# Patient Record
Sex: Female | Born: 1940 | Race: White | Hispanic: No | Marital: Married | Smoking: Never smoker
Health system: Southern US, Community
[De-identification: ages and names within clinical notes are randomized; demographics above are authoritative.]

## PROBLEM LIST (undated history)

## (undated) DIAGNOSIS — I10 Essential (primary) hypertension: Secondary | ICD-10-CM

## (undated) DIAGNOSIS — F5104 Psychophysiologic insomnia: Secondary | ICD-10-CM

## (undated) DIAGNOSIS — E7800 Pure hypercholesterolemia, unspecified: Secondary | ICD-10-CM

## (undated) DIAGNOSIS — L600 Ingrowing nail: Secondary | ICD-10-CM

## (undated) DIAGNOSIS — M25562 Pain in left knee: Secondary | ICD-10-CM

## (undated) DIAGNOSIS — M62830 Muscle spasm of back: Secondary | ICD-10-CM

## (undated) DIAGNOSIS — E871 Hypo-osmolality and hyponatremia: Secondary | ICD-10-CM

## (undated) DIAGNOSIS — F418 Other specified anxiety disorders: Secondary | ICD-10-CM

## (undated) DIAGNOSIS — E559 Vitamin D deficiency, unspecified: Secondary | ICD-10-CM

## (undated) DIAGNOSIS — M419 Scoliosis, unspecified: Secondary | ICD-10-CM

## (undated) DIAGNOSIS — E785 Hyperlipidemia, unspecified: Secondary | ICD-10-CM

## (undated) DIAGNOSIS — R42 Dizziness and giddiness: Secondary | ICD-10-CM

## (undated) DIAGNOSIS — M545 Low back pain, unspecified: Secondary | ICD-10-CM

## (undated) DIAGNOSIS — K5792 Diverticulitis of intestine, part unspecified, without perforation or abscess without bleeding: Secondary | ICD-10-CM

## (undated) DIAGNOSIS — G8929 Other chronic pain: Secondary | ICD-10-CM

## (undated) DIAGNOSIS — Z860100 Personal history of colon polyps, unspecified: Secondary | ICD-10-CM

## (undated) DIAGNOSIS — K279 Peptic ulcer, site unspecified, unspecified as acute or chronic, without hemorrhage or perforation: Secondary | ICD-10-CM

## (undated) DIAGNOSIS — E079 Disorder of thyroid, unspecified: Secondary | ICD-10-CM

## (undated) DIAGNOSIS — N952 Postmenopausal atrophic vaginitis: Secondary | ICD-10-CM

## (undated) DIAGNOSIS — K219 Gastro-esophageal reflux disease without esophagitis: Secondary | ICD-10-CM

## (undated) DIAGNOSIS — K449 Diaphragmatic hernia without obstruction or gangrene: Secondary | ICD-10-CM

## (undated) DIAGNOSIS — K648 Other hemorrhoids: Secondary | ICD-10-CM

## (undated) DIAGNOSIS — G4733 Obstructive sleep apnea (adult) (pediatric): Secondary | ICD-10-CM

## (undated) DIAGNOSIS — M1990 Unspecified osteoarthritis, unspecified site: Secondary | ICD-10-CM

## (undated) DIAGNOSIS — E041 Nontoxic single thyroid nodule: Secondary | ICD-10-CM

## (undated) HISTORY — DX: Dizziness and giddiness: R42

## (undated) HISTORY — DX: Gastro-esophageal reflux disease without esophagitis: K21.9

## (undated) HISTORY — DX: Personal history of colon polyps, unspecified: Z86.0100

## (undated) HISTORY — DX: Postmenopausal atrophic vaginitis: N95.2

## (undated) HISTORY — DX: Scoliosis, unspecified: M41.9

## (undated) HISTORY — PX: CHOLECYSTECTOMY: SHX55

## (undated) HISTORY — DX: Personal history of colonic polyps: Z86.010

## (undated) HISTORY — DX: Peptic ulcer, site unspecified, unspecified as acute or chronic, without hemorrhage or perforation: K27.9

## (undated) HISTORY — DX: Nontoxic single thyroid nodule: E04.1

## (undated) HISTORY — DX: Low back pain: M54.5

## (undated) HISTORY — DX: Hyperlipidemia, unspecified: E78.5

## (undated) HISTORY — DX: Diverticulitis of intestine, part unspecified, without perforation or abscess without bleeding: K57.92

## (undated) HISTORY — DX: Obstructive sleep apnea (adult) (pediatric): G47.33

## (undated) HISTORY — DX: Other hemorrhoids: K64.8

## (undated) HISTORY — DX: Other chronic pain: G89.29

## (undated) HISTORY — DX: Unspecified osteoarthritis, unspecified site: M19.90

## (undated) HISTORY — DX: Diaphragmatic hernia without obstruction or gangrene: K44.9

## (undated) HISTORY — DX: Disorder of thyroid, unspecified: E07.9

## (undated) HISTORY — DX: Essential (primary) hypertension: I10

## (undated) HISTORY — DX: Low back pain, unspecified: M54.50

---

## 1965-09-04 HISTORY — PX: ANKLE FUSION: SHX881

## 1965-09-04 HISTORY — PX: FRACTURE SURGERY: SHX138

## 2001-10-11 ENCOUNTER — Encounter: Payer: Self-pay | Admitting: Internal Medicine

## 2001-12-06 ENCOUNTER — Encounter: Payer: Self-pay | Admitting: Internal Medicine

## 2004-06-29 ENCOUNTER — Ambulatory Visit: Payer: Self-pay | Admitting: Obstetrics and Gynecology

## 2005-04-26 ENCOUNTER — Encounter: Payer: Self-pay | Admitting: Internal Medicine

## 2005-05-05 ENCOUNTER — Encounter: Payer: Self-pay | Admitting: Internal Medicine

## 2005-06-04 ENCOUNTER — Encounter: Payer: Self-pay | Admitting: Family Medicine

## 2005-06-04 ENCOUNTER — Encounter: Payer: Self-pay | Admitting: Internal Medicine

## 2005-06-29 ENCOUNTER — Ambulatory Visit: Payer: Self-pay | Admitting: Obstetrics and Gynecology

## 2006-04-19 ENCOUNTER — Ambulatory Visit: Payer: Self-pay | Admitting: Internal Medicine

## 2006-04-27 ENCOUNTER — Ambulatory Visit: Payer: Self-pay | Admitting: Family Medicine

## 2006-05-18 ENCOUNTER — Ambulatory Visit: Payer: Self-pay | Admitting: Internal Medicine

## 2006-05-30 ENCOUNTER — Ambulatory Visit: Payer: Self-pay | Admitting: Family Medicine

## 2006-06-20 ENCOUNTER — Ambulatory Visit: Payer: Self-pay | Admitting: Family Medicine

## 2006-08-06 ENCOUNTER — Ambulatory Visit: Payer: Self-pay

## 2006-08-09 ENCOUNTER — Ambulatory Visit: Payer: Self-pay | Admitting: Obstetrics and Gynecology

## 2006-09-18 ENCOUNTER — Ambulatory Visit: Payer: Self-pay | Admitting: Family Medicine

## 2006-09-18 LAB — CONVERTED CEMR LAB
ALT: 44 units/L — ABNORMAL HIGH (ref 0–40)
Albumin: 4.5 g/dL (ref 3.5–5.2)
Alkaline Phosphatase: 96 units/L (ref 39–117)
BUN: 18 mg/dL (ref 6–23)
CO2: 27 meq/L (ref 19–32)
Cholesterol: 279 mg/dL (ref 0–200)
GFR calc non Af Amer: 59 mL/min
Total Bilirubin: 1.1 mg/dL (ref 0.3–1.2)
Total Protein: 7.7 g/dL (ref 6.0–8.3)
VLDL: 32 mg/dL (ref 0–40)

## 2006-11-05 ENCOUNTER — Ambulatory Visit: Payer: Self-pay | Admitting: Family Medicine

## 2006-12-17 ENCOUNTER — Ambulatory Visit: Payer: Self-pay | Admitting: Family Medicine

## 2006-12-17 ENCOUNTER — Encounter: Payer: Self-pay | Admitting: Family Medicine

## 2006-12-17 DIAGNOSIS — J302 Other seasonal allergic rhinitis: Secondary | ICD-10-CM

## 2006-12-17 DIAGNOSIS — M4120 Other idiopathic scoliosis, site unspecified: Secondary | ICD-10-CM | POA: Insufficient documentation

## 2006-12-17 DIAGNOSIS — J3089 Other allergic rhinitis: Secondary | ICD-10-CM

## 2006-12-17 DIAGNOSIS — K5730 Diverticulosis of large intestine without perforation or abscess without bleeding: Secondary | ICD-10-CM | POA: Insufficient documentation

## 2006-12-17 DIAGNOSIS — N952 Postmenopausal atrophic vaginitis: Secondary | ICD-10-CM | POA: Insufficient documentation

## 2006-12-17 DIAGNOSIS — M159 Polyosteoarthritis, unspecified: Secondary | ICD-10-CM

## 2006-12-17 DIAGNOSIS — E7800 Pure hypercholesterolemia, unspecified: Secondary | ICD-10-CM

## 2006-12-17 DIAGNOSIS — D126 Benign neoplasm of colon, unspecified: Secondary | ICD-10-CM | POA: Insufficient documentation

## 2006-12-17 DIAGNOSIS — I10 Essential (primary) hypertension: Secondary | ICD-10-CM

## 2006-12-18 DIAGNOSIS — M545 Low back pain: Secondary | ICD-10-CM

## 2006-12-18 DIAGNOSIS — R42 Dizziness and giddiness: Secondary | ICD-10-CM

## 2007-02-07 ENCOUNTER — Encounter: Payer: Self-pay | Admitting: Internal Medicine

## 2007-02-08 ENCOUNTER — Telehealth (INDEPENDENT_AMBULATORY_CARE_PROVIDER_SITE_OTHER): Payer: Self-pay | Admitting: *Deleted

## 2007-04-23 ENCOUNTER — Ambulatory Visit: Payer: Self-pay | Admitting: Family Medicine

## 2007-04-24 ENCOUNTER — Encounter (INDEPENDENT_AMBULATORY_CARE_PROVIDER_SITE_OTHER): Payer: Self-pay | Admitting: *Deleted

## 2007-04-25 LAB — CONVERTED CEMR LAB
BUN: 19 mg/dL (ref 6–23)
Creatinine, Ser: 0.8 mg/dL (ref 0.4–1.2)
Direct LDL: 174 mg/dL
GFR calc Af Amer: 92 mL/min
HDL: 56.3 mg/dL (ref >39.0)
Potassium: 3.7 meq/L (ref 3.5–5.1)
Triglycerides: 201 mg/dL (ref 0–149)
VLDL: 40 mg/dL (ref 0–40)

## 2007-04-29 ENCOUNTER — Ambulatory Visit: Payer: Self-pay | Admitting: Family Medicine

## 2007-04-29 LAB — CONVERTED CEMR LAB: Cholesterol, target level: 200 mg/dL

## 2007-05-09 ENCOUNTER — Ambulatory Visit: Payer: Self-pay | Admitting: Family Medicine

## 2007-05-09 DIAGNOSIS — K648 Other hemorrhoids: Secondary | ICD-10-CM | POA: Insufficient documentation

## 2007-05-14 ENCOUNTER — Telehealth: Payer: Self-pay | Admitting: Family Medicine

## 2007-05-28 ENCOUNTER — Telehealth: Payer: Self-pay | Admitting: Family Medicine

## 2007-05-29 ENCOUNTER — Ambulatory Visit: Payer: Self-pay | Admitting: Family Medicine

## 2007-06-05 ENCOUNTER — Telehealth: Payer: Self-pay | Admitting: Family Medicine

## 2007-07-05 ENCOUNTER — Telehealth: Payer: Self-pay | Admitting: Family Medicine

## 2007-07-12 ENCOUNTER — Ambulatory Visit: Payer: Self-pay | Admitting: Family Medicine

## 2007-08-13 ENCOUNTER — Ambulatory Visit: Payer: Self-pay | Admitting: Family Medicine

## 2007-08-15 ENCOUNTER — Telehealth: Payer: Self-pay | Admitting: Family Medicine

## 2007-08-15 LAB — CONVERTED CEMR LAB
TSH: 0.55 microintl units/mL (ref 0.35–5.50)
Total CHOL/HDL Ratio: 4.1
VLDL: 26 mg/dL (ref 0–40)

## 2007-08-16 ENCOUNTER — Telehealth: Payer: Self-pay | Admitting: Family Medicine

## 2007-08-20 ENCOUNTER — Ambulatory Visit: Payer: Self-pay | Admitting: Family Medicine

## 2007-08-26 ENCOUNTER — Telehealth: Payer: Self-pay | Admitting: Family Medicine

## 2007-09-10 ENCOUNTER — Ambulatory Visit: Payer: Self-pay | Admitting: Obstetrics and Gynecology

## 2007-10-07 ENCOUNTER — Telehealth: Payer: Self-pay | Admitting: Family Medicine

## 2007-10-25 ENCOUNTER — Telehealth: Payer: Self-pay | Admitting: Internal Medicine

## 2007-11-19 ENCOUNTER — Ambulatory Visit: Payer: Self-pay | Admitting: Family Medicine

## 2007-11-22 LAB — CONVERTED CEMR LAB: Cholesterol: 222 mg/dL (ref 0–200)

## 2007-12-11 ENCOUNTER — Ambulatory Visit: Payer: Self-pay | Admitting: Family Medicine

## 2008-01-16 ENCOUNTER — Telehealth: Payer: Self-pay | Admitting: Family Medicine

## 2008-01-22 ENCOUNTER — Telehealth: Payer: Self-pay | Admitting: Family Medicine

## 2008-01-22 ENCOUNTER — Encounter: Payer: Self-pay | Admitting: Family Medicine

## 2008-03-05 ENCOUNTER — Telehealth (INDEPENDENT_AMBULATORY_CARE_PROVIDER_SITE_OTHER): Payer: Self-pay | Admitting: *Deleted

## 2008-03-16 ENCOUNTER — Ambulatory Visit: Payer: Self-pay | Admitting: Family Medicine

## 2008-03-18 LAB — CONVERTED CEMR LAB
Cholesterol: 248 mg/dL (ref 0–200)
Total CHOL/HDL Ratio: 4.3
Triglycerides: 186 mg/dL — ABNORMAL HIGH (ref 0–149)
VLDL: 37 mg/dL (ref 0–40)

## 2008-04-20 ENCOUNTER — Telehealth: Payer: Self-pay | Admitting: Family Medicine

## 2008-05-19 ENCOUNTER — Ambulatory Visit: Payer: Self-pay | Admitting: Family Medicine

## 2008-05-19 LAB — CONVERTED CEMR LAB
Ketones, urine, test strip: NEGATIVE
Mucus, UA: 0
Nitrite: NEGATIVE
RBC / HPF: 0
Urine crystals, microscopic: 0 /hpf
Urobilinogen, UA: 0.2

## 2008-06-08 ENCOUNTER — Telehealth: Payer: Self-pay | Admitting: Family Medicine

## 2008-07-23 ENCOUNTER — Telehealth: Payer: Self-pay | Admitting: Family Medicine

## 2008-07-24 ENCOUNTER — Telehealth: Payer: Self-pay | Admitting: Family Medicine

## 2008-08-04 LAB — CONVERTED CEMR LAB: Pap Smear: NORMAL

## 2008-08-13 ENCOUNTER — Ambulatory Visit: Payer: Self-pay | Admitting: Family Medicine

## 2008-08-19 LAB — CONVERTED CEMR LAB
Cholesterol: 223 mg/dL (ref 0–200)
Direct LDL: 132.8 mg/dL
HDL: 63.1 mg/dL (ref >39.0)
Total CHOL/HDL Ratio: 3.5
Triglycerides: 92 mg/dL (ref 0–149)
VLDL: 18 mg/dL (ref 0–40)

## 2008-09-01 ENCOUNTER — Telehealth: Payer: Self-pay | Admitting: Family Medicine

## 2008-09-04 LAB — HM MAMMOGRAPHY

## 2008-09-04 LAB — CONVERTED CEMR LAB

## 2008-09-10 ENCOUNTER — Telehealth: Payer: Self-pay | Admitting: Family Medicine

## 2008-09-15 ENCOUNTER — Ambulatory Visit: Payer: Medicare Other | Admitting: Obstetrics and Gynecology

## 2008-10-08 ENCOUNTER — Telehealth: Payer: Self-pay | Admitting: Family Medicine

## 2008-10-19 ENCOUNTER — Telehealth: Payer: Self-pay | Admitting: Family Medicine

## 2008-10-26 ENCOUNTER — Telehealth: Payer: Self-pay | Admitting: Family Medicine

## 2008-11-04 ENCOUNTER — Telehealth: Payer: Self-pay | Admitting: Family Medicine

## 2008-11-17 ENCOUNTER — Telehealth: Payer: Self-pay | Admitting: Family Medicine

## 2008-11-20 ENCOUNTER — Telehealth (INDEPENDENT_AMBULATORY_CARE_PROVIDER_SITE_OTHER): Payer: Self-pay | Admitting: *Deleted

## 2009-01-19 ENCOUNTER — Telehealth: Payer: Self-pay | Admitting: Family Medicine

## 2009-01-20 ENCOUNTER — Telehealth: Payer: Self-pay | Admitting: Family Medicine

## 2009-01-21 ENCOUNTER — Telehealth: Payer: Self-pay | Admitting: Family Medicine

## 2009-03-05 ENCOUNTER — Ambulatory Visit: Payer: Self-pay | Admitting: Internal Medicine

## 2009-03-05 DIAGNOSIS — G4733 Obstructive sleep apnea (adult) (pediatric): Secondary | ICD-10-CM

## 2009-03-15 DIAGNOSIS — E042 Nontoxic multinodular goiter: Secondary | ICD-10-CM

## 2009-03-17 ENCOUNTER — Telehealth: Payer: Self-pay | Admitting: Internal Medicine

## 2009-03-21 ENCOUNTER — Encounter: Payer: Self-pay | Admitting: Internal Medicine

## 2009-03-24 ENCOUNTER — Encounter: Payer: Self-pay | Admitting: Internal Medicine

## 2009-03-28 ENCOUNTER — Telehealth: Payer: Self-pay | Admitting: Internal Medicine

## 2009-04-07 ENCOUNTER — Telehealth: Payer: Self-pay | Admitting: Family Medicine

## 2009-04-07 ENCOUNTER — Ambulatory Visit: Payer: Self-pay | Admitting: Family Medicine

## 2009-04-07 LAB — CONVERTED CEMR LAB
Albumin: 4.3 g/dL (ref 3.5–5.2)
Alkaline Phosphatase: 88 units/L (ref 39–117)
BUN: 18 mg/dL (ref 6–23)
Calcium: 9.4 mg/dL (ref 8.4–10.5)
Cholesterol: 248 mg/dL — ABNORMAL HIGH (ref 0–200)
Creatinine, Ser: 0.8 mg/dL (ref 0.4–1.2)
Direct LDL: 166.4 mg/dL
GFR calc non Af Amer: 75.45 mL/min (ref >60)
Glucose, Bld: 95 mg/dL (ref 70–99)
HDL: 54.3 mg/dL (ref >39.00)
Sodium: 141 meq/L (ref 135–145)
Triglycerides: 180 mg/dL — ABNORMAL HIGH (ref 0.0–149.0)

## 2009-04-08 ENCOUNTER — Telehealth: Payer: Self-pay | Admitting: Family Medicine

## 2009-04-14 ENCOUNTER — Telehealth: Payer: Self-pay | Admitting: Family Medicine

## 2009-05-04 ENCOUNTER — Ambulatory Visit: Payer: Self-pay | Admitting: Internal Medicine

## 2009-05-19 ENCOUNTER — Encounter: Payer: Self-pay | Admitting: Internal Medicine

## 2009-05-25 ENCOUNTER — Ambulatory Visit: Payer: Self-pay | Admitting: Family Medicine

## 2009-06-02 ENCOUNTER — Telehealth: Payer: Self-pay | Admitting: Family Medicine

## 2009-06-29 ENCOUNTER — Ambulatory Visit: Payer: Self-pay

## 2009-06-29 ENCOUNTER — Encounter: Payer: Self-pay | Admitting: Family Medicine

## 2009-06-30 ENCOUNTER — Encounter: Payer: Self-pay | Admitting: Family Medicine

## 2009-06-30 ENCOUNTER — Ambulatory Visit: Payer: Medicare Other | Admitting: Family Medicine

## 2009-06-30 ENCOUNTER — Encounter (INDEPENDENT_AMBULATORY_CARE_PROVIDER_SITE_OTHER): Payer: Self-pay | Admitting: *Deleted

## 2009-06-30 DIAGNOSIS — I6529 Occlusion and stenosis of unspecified carotid artery: Secondary | ICD-10-CM | POA: Insufficient documentation

## 2009-07-08 ENCOUNTER — Ambulatory Visit: Payer: Self-pay | Admitting: Family Medicine

## 2009-07-20 ENCOUNTER — Encounter: Payer: Self-pay | Admitting: Internal Medicine

## 2009-07-22 ENCOUNTER — Encounter: Payer: Self-pay | Admitting: Internal Medicine

## 2009-08-02 ENCOUNTER — Telehealth: Payer: Self-pay | Admitting: Family Medicine

## 2009-08-02 ENCOUNTER — Encounter: Payer: Self-pay | Admitting: Internal Medicine

## 2009-08-09 ENCOUNTER — Ambulatory Visit: Payer: Self-pay | Admitting: Internal Medicine

## 2009-08-16 ENCOUNTER — Telehealth: Payer: Self-pay | Admitting: Family Medicine

## 2009-09-04 LAB — CONVERTED CEMR LAB

## 2009-09-27 ENCOUNTER — Ambulatory Visit: Payer: Medicare Other | Admitting: Obstetrics & Gynecology

## 2009-10-01 ENCOUNTER — Ambulatory Visit: Payer: Self-pay | Admitting: Family Medicine

## 2009-10-05 ENCOUNTER — Ambulatory Visit: Payer: Self-pay | Admitting: Family Medicine

## 2009-10-05 LAB — CONVERTED CEMR LAB
Bilirubin Urine: NEGATIVE
Blood in Urine, dipstick: NEGATIVE
Cholesterol: 248 mg/dL — ABNORMAL HIGH (ref 0–200)
Direct LDL: 161.3 mg/dL
Glucose, Urine, Semiquant: NEGATIVE
Protein, U semiquant: NEGATIVE
Specific Gravity, Urine: 1.01
Triglycerides: 119 mg/dL (ref 0.0–149.0)
pH: 6

## 2009-10-06 ENCOUNTER — Telehealth: Payer: Self-pay | Admitting: Family Medicine

## 2009-10-08 ENCOUNTER — Telehealth: Payer: Self-pay | Admitting: Family Medicine

## 2009-10-26 ENCOUNTER — Telehealth: Payer: Self-pay | Admitting: Family Medicine

## 2009-11-29 ENCOUNTER — Telehealth (INDEPENDENT_AMBULATORY_CARE_PROVIDER_SITE_OTHER): Payer: Self-pay | Admitting: *Deleted

## 2009-12-27 ENCOUNTER — Encounter: Payer: Self-pay | Admitting: Family Medicine

## 2009-12-28 ENCOUNTER — Encounter: Payer: Self-pay | Admitting: Family Medicine

## 2009-12-28 ENCOUNTER — Ambulatory Visit: Payer: Self-pay

## 2010-01-19 ENCOUNTER — Telehealth: Payer: Self-pay | Admitting: Family Medicine

## 2010-01-21 ENCOUNTER — Ambulatory Visit: Payer: Self-pay | Admitting: Family Medicine

## 2010-01-26 LAB — CONVERTED CEMR LAB
ALT: 25 units/L (ref 0–35)
AST: 19 units/L (ref 0–37)
Albumin: 4.4 g/dL (ref 3.5–5.2)
BUN: 21 mg/dL (ref 6–23)
Basophils Absolute: 0 10*3/uL (ref 0.0–0.1)
Chloride: 98 meq/L (ref 96–112)
Cholesterol: 236 mg/dL — ABNORMAL HIGH (ref 0–200)
Creatinine, Ser: 0.8 mg/dL (ref 0.4–1.2)
Direct LDL: 163.3 mg/dL
Eosinophils Relative: 1.7 % (ref 0.0–5.0)
GFR calc non Af Amer: 79.87 mL/min (ref >60)
Glucose, Bld: 93 mg/dL (ref 70–99)
HCT: 41.4 % (ref 36.0–46.0)
HDL: 67.6 mg/dL (ref >39.00)
Lymphocytes Relative: 39.7 % (ref 12.0–46.0)
Lymphs Abs: 2.9 10*3/uL (ref 0.7–4.0)
Monocytes Relative: 5.6 % (ref 3.0–12.0)
Platelets: 294 10*3/uL (ref 150.0–400.0)
RDW: 14.8 % — ABNORMAL HIGH (ref 11.5–14.6)
TSH: 1.14 microintl units/mL (ref 0.35–5.50)
Total Bilirubin: 0.8 mg/dL (ref 0.3–1.2)
VLDL: 23.8 mg/dL (ref 0.0–40.0)
WBC: 7.2 10*3/uL (ref 4.5–10.5)

## 2010-01-28 ENCOUNTER — Ambulatory Visit: Payer: Self-pay | Admitting: Family Medicine

## 2010-01-29 ENCOUNTER — Encounter: Payer: Self-pay | Admitting: Family Medicine

## 2010-02-16 ENCOUNTER — Telehealth: Payer: Self-pay | Admitting: Family Medicine

## 2010-02-17 ENCOUNTER — Telehealth: Payer: Self-pay | Admitting: Family Medicine

## 2010-02-18 ENCOUNTER — Telehealth: Payer: Self-pay | Admitting: Family Medicine

## 2010-03-08 ENCOUNTER — Ambulatory Visit: Payer: Self-pay | Admitting: Family Medicine

## 2010-03-18 ENCOUNTER — Telehealth: Payer: Self-pay | Admitting: Family Medicine

## 2010-03-23 ENCOUNTER — Telehealth: Payer: Self-pay | Admitting: Family Medicine

## 2010-04-05 ENCOUNTER — Encounter: Payer: Self-pay | Admitting: Internal Medicine

## 2010-05-10 ENCOUNTER — Telehealth (INDEPENDENT_AMBULATORY_CARE_PROVIDER_SITE_OTHER): Payer: Self-pay | Admitting: *Deleted

## 2010-05-20 ENCOUNTER — Ambulatory Visit: Payer: Self-pay | Admitting: Family Medicine

## 2010-05-20 DIAGNOSIS — M26629 Arthralgia of temporomandibular joint, unspecified side: Secondary | ICD-10-CM

## 2010-05-23 ENCOUNTER — Telehealth: Payer: Self-pay | Admitting: Family Medicine

## 2010-05-27 ENCOUNTER — Ambulatory Visit: Payer: Self-pay | Admitting: Family Medicine

## 2010-05-27 ENCOUNTER — Ambulatory Visit: Payer: Self-pay | Admitting: Internal Medicine

## 2010-05-27 LAB — CONVERTED CEMR LAB
AST: 23 units/L (ref 0–37)
Albumin: 4.7 g/dL (ref 3.5–5.2)
BUN: 19 mg/dL (ref 6–23)
Calcium: 9.8 mg/dL (ref 8.4–10.5)
Cholesterol: 206 mg/dL — ABNORMAL HIGH (ref 0–200)
Creatinine, Ser: 0.8 mg/dL (ref 0.4–1.2)
Direct LDL: 111.9 mg/dL
GFR calc non Af Amer: 74.13 mL/min (ref >60)
Glucose, Bld: 92 mg/dL (ref 70–99)
HDL: 72.7 mg/dL (ref >39.00)
Total Bilirubin: 0.6 mg/dL (ref 0.3–1.2)
Triglycerides: 117 mg/dL (ref 0.0–149.0)
VLDL: 23.4 mg/dL (ref 0.0–40.0)

## 2010-05-31 ENCOUNTER — Ambulatory Visit: Payer: Self-pay | Admitting: Family Medicine

## 2010-05-31 LAB — HM COLONOSCOPY

## 2010-06-30 ENCOUNTER — Ambulatory Visit: Payer: Self-pay

## 2010-06-30 ENCOUNTER — Encounter: Payer: Self-pay | Admitting: Family Medicine

## 2010-07-19 ENCOUNTER — Telehealth: Payer: Self-pay | Admitting: Family Medicine

## 2010-08-09 ENCOUNTER — Ambulatory Visit: Payer: Self-pay | Admitting: Internal Medicine

## 2010-08-15 ENCOUNTER — Ambulatory Visit: Payer: Self-pay | Admitting: Internal Medicine

## 2010-08-15 DIAGNOSIS — J209 Acute bronchitis, unspecified: Secondary | ICD-10-CM | POA: Insufficient documentation

## 2010-09-12 ENCOUNTER — Telehealth: Payer: Self-pay | Admitting: Family Medicine

## 2010-10-04 NOTE — Progress Notes (Signed)
Summary: skelaxin  Phone Note Refill Request Message from:  [facility] on Jan 19, 2010 8:51 AM  Refills Requested: Medication #1:  SKELAXIN 800 MG  TABS take 1 by mouth three times a day [BMN]   Supply Requested: [facility] month [facility] [address]   Follow-up for Phone Call        Called to [facility] [address]            Lowella Petties CMA  Jan 21, 2010 2:02 PM     Prescriptions: SKELAXIN 800 MG  TABS (METAXALONE) take 1 by mouth three times a day Brand medically necessary #90 x 0   Entered and Authorized by:   Kerby Nora MD   Signed by:   Kerby Nora MD on 01/21/2010   Method used:   Telephoned to ...       [facility]  [id number] ([facility]ail[facility]       [address]       [place], [place]  [address]       Ph: [phone]       Fax: [phone]   RxID:   [id number]

## 2010-10-04 NOTE — Letter (Signed)
Summary: Pt's Blood Pressure Readings 01/29/10-02/11/10  Pt's Blood Pressure Readings 01/29/10-02/11/10   Imported By: Beau Fanny 02/14/2010 15:20:33  _____________________________________________________________________  External Attachment:    Type:   Image     Comment:   External Document  Appended Document: Orders Update Medications Added DYNACIRC CR 5 MG  TB24 (ISRADIPINE) Take 2 tablet by mouth two times a day       Mutiple measurements of BP >140/90... recommend increasing dynacirc to 10 mg daily..double up for now. Follow BP and pulse call with measurments in 1-2 weeks. Kerby Nora MD  February 14, 2010 4:11 PM   Clinical Lists Changes  Medications: Changed medication from [facility] CR 5 MG  TB24 (ISRADIPINE) Take 1 tablet by mouth two times a day to [facility] CR 5 MG  TB24 (ISRADIPINE) Take 2 tablet by mouth two times a day      Appended Document: Pt's Blood Pressure Readings 01/29/10-02/11/10 Patietn would like the BRAND NAME and patient would like [facility] [place] [facility] 30 days   Appended Document: Orders Update Medications Added AVAPRO 75 MG TABS (IRBESARTAN) 1 tab by mouth daily       Correction.Pt already on max dynac circ. HAs not tolerated ACEi and BB in past. Not clear why avapro D/C'd in past.Just fell off med list.   Will add back. Call with Bp mesurements in 1-2 weeks. Kerby Nora MD  February 14, 2010 4:48 PM   Clinical Lists Changes  Medications: Added new medication of AVAPRO 75 MG TABS (IRBESARTAN) 1 tab by mouth daily - Signed Rx of AVAPRO 75 MG TABS (IRBESARTAN) 1 tab by mouth daily;  #30 x 3;  Signed;  Entered by: Kerby Nora MD;  Authorized by: Kerby Nora MD;  Method used: Electronically to [facility] [id number], [place], [place]  [address], Ph: [phone], Fax: [phone]    Prescriptions: AVAPRO 75 MG TABS (IRBESARTAN) 1 tab by mouth daily  #30 x 3   Entered and Authorized by:   Kerby Nora MD   Signed  by:   Kerby Nora MD on 02/14/2010   Method used:   Electronically to        [facility] [address] [phone]* (retail)       [address] [place]  [address]       Ph: [phone]       Fax: [phone]   RxID:   [id number]

## 2010-10-04 NOTE — Progress Notes (Signed)
Summary: Rx Skelaxin  Phone Note Refill Request Call back at [phone] Message from:  [facility] on March 18, 2010 9:16 AM  Refills Requested: Medication #1:  SKELAXIN 800 MG  TABS take 1 by mouth three times a day [BMN]   Last Refilled: 01/21/2010 Received E-script request please advise.   Method Requested: Telephone to Pharmacy Initial call taken by: Linde Gillis CMA [facility] Dull),  March 18, 2010 9:16 AM  Follow-up for Phone Call        Rx called to pharmacy Follow-up by: Linde Gillis CMA [facility] Dull),  March 18, 2010 12:10 PM    Prescriptions: SKELAXIN 800 MG  TABS (METAXALONE) take 1 by mouth three times a day Brand medically necessary #90 x 0   Entered and Authorized by:   Kerby Nora MD   Signed by:   Kerby Nora MD on 03/18/2010   Method used:   Telephoned to ...       [facility] [address] [phone]* (retail)       [address] [place]  [address]       Ph: [phone]       Fax: [phone]   RxID:   [id number]
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
## 2010-10-04 NOTE — Progress Notes (Signed)
Summary: rx  Phone Note Refill Request Message from:  [facility] on July 19, 2010 9:00 AM  Refills Requested: Medication #1:  SKELAXIN 800 MG  TABS take 1 by mouth three times a day [BMN]   [facility]ed: [facility] [address]   Method Requested: Electronic Initial call taken by: Benny Lennert CMA [facility] Dull),  July 19, 2010 9:00 AM  Follow-up for Phone Call        Rx called to pharmacy Follow-up by: Benny Lennert CMA Duncan Dull),  July 19, 2010 9:48 AM    Prescriptions: SKELAXIN 800 MG  TABS (METAXALONE) take 1 by mouth three times a day Brand medically necessary #90 x 0   Entered and Authorized by:   Kerby Nora MD   Signed by:   Kerby Nora MD on 07/19/2010   Method used:   Telephoned to ...       [facility] [address] (retail)       [address]       [place], [place]  [address]       Ph: [phone]       Fax: [phone]   RxID:   [id number]

## 2010-10-04 NOTE — Progress Notes (Signed)
Summary: selaxin  Phone Note Refill Request Message from:  [facility] on November 29, 2009 7:59 AM  Refills Requested: Medication #1:  SKELAXIN 800 MG  TABS take 1 by mouth three times a day [BMN]   [facility]ed[facility] [address]   Method Requested: Electronic Initial call taken by: Benny Lennert CMA Duncan Dull),  November 29, 2009 8:00 AM  Follow-up for Phone Call        Rx called to pharmacy Follow-up by: Benny Lennert CMA Duncan Dull),  November 30, 2009 9:07 AM    Prescriptions: SKELAXIN 800 MG  TABS (METAXALONE) take 1 by mouth three times a day Brand medically necessary #90 x 0   Entered and Authorized by:   Kerby Nora MD   Signed by:   Kerby Nora MD on 11/30/2009   Method used:   [facility]ph[facility] to ...       [facility]  [id number]       [address]       [place], [place]  [address]       Ph: [phone]       Fax: [phone]   RxID:   [id number]

## 2010-10-04 NOTE — Progress Notes (Signed)
Summary: Nausea and diarrhea  Phone Note Call from Patient Call back at [phone]   [name]: Patient Call For: Kerby Nora MD Summary of Call: Seen on 02/01/11by Dr Ermalene Searing and pt taking 2 antibiotics for diverticulitis. Stomach pain is better but diarrhea and nausea started last night. Pt has not taken OTC med for nausea or diarrhea. Pt has drank water and ate oatmeal and toast this AM and kept it down but does not feel like she can swallow the antibiotics(because the size of the pills and she is nauseated). Pt wonders if can stop the antibiotic or can med be called in to help with nausea. Pt uses [facility] [phone]. Please advise.  Initial call taken by: Lewanda Rife LPN,  October 08, 2009 12:03 PM  Follow-up for Phone Call        HAve her stop flagyl.  Continue cipro two times a day .Marland KitchenAlso sent  in antiemetic as below.  Follow-up by: Kerby Nora MD,  October 08, 2009 12:38 PM  Additional Follow-up for Phone Call Additional follow up Details #1::        Patient advised as instructed. Additional Follow-up by: Linde Gillis CMA [facility] Dull),  October 08, 2009 12:58 PM    New/Updated Medications: PROMETHAZINE HCL 25 MG TABS (PROMETHAZINE HCL) 1 tab po q 6hours as needed nausea Prescriptions: PROMETHAZINE HCL 25 MG TABS (PROMETHAZINE HCL) 1 tab po q 6hours as needed nausea  #20 x 0   Entered and Authorized by:   Kerby Nora MD   Signed by:   Kerby Nora MD on 10/08/2009   Method used:   Electronically to        [facility] [address] [phone]* (retail)       [address], [place]  [address]       Ph: [phone]       Fax: [phone]   RxID:   [id number]
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
## 2010-10-04 NOTE — Progress Notes (Signed)
Summary: Medication ?  Phone Note Call from Patient Call back at Cell:  [phone]   [name]: Patient Call For: Kerby Nora MD Summary of Call: The patient is confused about her medication.  I read through your recent note about doubling Dynacirc but then correcting that note and adding back the Avapro instead.  However, somehow along the way, the patient thought she was being put on Cozaar.  The Avapro was sent to the pharmacy but she didn't pick it up because she thought it was supposed to be Cozaar (Losartan).  If you could let me know why she was thinking Losartan, I will call her back to try to  explain.  Patient called back again saying that she is very concerned about being on ARB's as well because of her severe reaction to ACEi's and BB's.  She says she was in the hospital because of not being able to breathe, etc. on the ACEi's. Initial call taken by: Delilah Shan CMA [facility]ull),  February 16, 2010 9:54 AM  Follow-up for Phone Call        ARBs work in a different way than ACEI and BB...should not expect similar reaction.  Initially, I mentioned losartan/cozaar to [facility] because I always prescribe generic, but as she was on avapro in past (tolerated it) and she REQUESTED BRAND NAME only.Marland KitchenMarland KitchenI sent in avapro, also an ARB. Follow-up by: Kerby Nora MD,  February 16, 2010 10:16 AM  Additional Follow-up for Phone Call Additional follow up Details #1::        She says she has never been on Avapro.  She says it must have been on her meds list by mistake and removed because she has never been on it.  I also checked her paper chart and there is no mention of her ever being on Avapro.   Lugene Fuquay CMA [facility] Dull)  February 16, 2010 10:18 AM  Additional Follow-up by: Delilah Shan CMA [facility]ull),  February 16, 2010 10:25 AM    Additional Follow-up for Phone Call Additional follow up Details #2::    Okay.Marland KitchenMarland KitchenI still recommend an ARB...have her go ahead and fill avapro since she wants brand name. Follow-up by: Kerby Nora MD,   February 16, 2010 10:28 AM  Additional Follow-up for Phone Call Additional follow up Details #3:: Details for Additional Follow-up Action Taken: Left message on voicemail  in detail.  Personalized VM. Lugene Fuquay CMA [facility]ull)  February 16, 2010 10:44 AM
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
## 2010-10-04 NOTE — Progress Notes (Signed)
Summary: wants brand only  Phone Note Call from Patient Call back at Home Phone [phone]   [name]: Patient Call For: [name] [name] Summary of Call: Patient called back and said that after starting her med she is feelling great. BP is down, the only complaint she is having is she feels a little dizzy. She wants to know if she can get a new rx for the brand instead of generic. She wants to try and see if it helps with her dizziness. Uses [facility] on [address]  Initial call taken by: Melody Comas,  February 18, 2010 10:14 AM  Follow-up for [employer] will not pay for this....she can pay out of pocket...please send in rx cozaar same dose same refills and # Needs to specify brand requested by pt.  Follow-up by: [name] [name],  February 18, 2010 10:37 AM    New/Updated Medications: COZAAR 25 MG TABS (LOSARTAN POTASSIUM) take one tablet daily Prescriptions: COZAAR 25 MG TABS (LOSARTAN POTASSIUM) take one tablet daily  #30 x 0   Entered by:   Benny Lennert CMA (AAMA)   Authorized by:   [name] [name]   Signed by:   Benny Lennert CMA (AAMA) on 02/18/2010   Method used:   Electronically to        [facility] [address] [phone]* (retail)       [address] [place]  [address]       Ph: [phone]       Fax: [phone]   RxID:   [id number]
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
## 2010-10-04 NOTE — Progress Notes (Signed)
----   Converted from flag ---- ---- 05/07/2010 11:41 PM, Kerby Nora MD wrote: Fasting lipids, CMET  Dx 272.0  ---- 05/05/2010 10:25 AM, Liane Comber CMA (AAMA) wrote: Peri [facility] Morning! This pt is scheduled for cpx labs Tuesday, which labs to draw and dx codes to use? Thanks Tasha ------------------------------

## 2010-10-04 NOTE — Progress Notes (Signed)
Summary: Starting medication  Phone Note Call from Patient Call back at Home Phone [phone]   [name]: Patient Call For: Kerby Nora MD Summary of Call: FYI:  Patient phoned in today saying that she picked up her medication yesterday and the pharmacist warned her again about a reaction to the ARB's  because she was so sensitive to the ACEi's.  I reiterated your statement that the ARB's work differently and there is only a slight chance of reaction.  She agreed to start the medication, possibly cutting them in half in the beginning.  I suggested that if she became uncomfortable with this plan, she should make an appointment to come in and discuss this with you face to face.  She agreed. Initial call taken by: Delilah Shan CMA [facility]),  February 17, 2010 8:52 AM

## 2010-10-04 NOTE — Progress Notes (Signed)
Summary: prior Berkley Harvey is needed for avapro  Phone Note From Pharmacy   [name]: [facility] [address]/ [employer] of Call: Prior Berkley Harvey is needed for avapro, form is on your desk. Initial call taken by: Lowella Petties CMA,  February 16, 2010 12:41 PM  Follow-up for Phone Call        Notify pt that insurance will not cover avapro brand..will send in losartan instead.  Follow-up by: Kerby Nora MD,  February 16, 2010 12:54 PM    New/Updated Medications: LOSARTAN POTASSIUM 25 MG TABS (LOSARTAN POTASSIUM) 1 tab po daily Prescriptions: LOSARTAN POTASSIUM 25 MG TABS (LOSARTAN POTASSIUM) 1 tab po daily  #30 x 11   Entered by:   Benny Lennert CMA (AAMA)   Authorized by:   Kerby Nora MD   Signed by:   Benny Lennert CMA (AAMA) on 02/16/2010   Method used:   Electronically to        [facility] [address] [phone]* (retail)       [address] [place], [place]  [address]       Ph: [phone]       Fax: [phone]   RxID:   [id number] GMWNUUVO POTASSIUM 25 MG TABS (LOSARTAN POTASSIUM) 1 tab po daily  #30 x 11   Entered and Authorized by:   Kerby Nora MD   Signed by:   Kerby Nora MD on 02/16/2010   Method used:   Electronically to        [id number] (retail[facility]       [address]       [place], [place]  [address]       Ph: [phone]       Fax: [phone]   RxID:   [id number]

## 2010-10-04 NOTE — Progress Notes (Signed)
Summary: needs referral for carotid doppler  Phone Note Call from Patient Call back at [phone]   [name]: Patient Call For: Kerby Nora MD Summary of Call: Pt has carotid doppler in october and was told that you wanted to repeat the test in 3 months.  She is calling to get that scheduled. The last one  was done at [facility] in [place]. Initial call taken by: Lowella Petties CMA,  October 26, 2009 10:14 AM  Follow-up for Phone Call        Repeat was recommended in 6 months...so from 06/2009...Marland KitchenMarland Kitchen4/2011. I believe  i already sent referral to [facility].  Follow-up by: Kerby Nora MD,  October 26, 2009 10:58 AM  Additional Follow-up for Phone Call Additional follow up Details #1::        6 month FU Carotid Doppler set up for 12/28/2009 at 9:30am, patient has been notified. Additional Follow-up by: Carlton Adam,  October 26, 2009 12:48 PM

## 2010-10-04 NOTE — Progress Notes (Signed)
Summary: Generic for Cozaar requesed per [employer]   Phone Note Other Incoming   [name]: [facility] Summary of Call: [facility] approval for generic meds for Cozaar--call back # [id number] Ref# [id number].Marland KitchenDaine Gip  March 23, 2010 1:08 PM   Initial call taken by: Daine Gip,  March 23, 2010 1:08 PM  Follow-up for Phone Call        Advised pharmacist at [facility] that pt needs name brand, doesnt tolerate generic. Follow-up by: Lowella Petties CMA,  March 23, 2010 2:07 PM
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
## 2010-10-04 NOTE — Miscellaneous (Signed)
Summary: Orders Update  Clinical Lists Changes  Orders: Added new Test order of Carotid Duplex (Carotid Duplex) - Signed 
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
## 2010-10-04 NOTE — Progress Notes (Signed)
Summary: Skelaxin  Phone Note Refill Request Message from:  [facility] on October 06, 2009 12:17 PM  Refills Requested: Medication #1:  SKELAXIN 800 MG  TABS take 1 by mouth three times a day [BMN] [facility] [address] [id number]*   Last Fill Date:  08/02/2009   Pharmacy Phone:  [phone]   Method Requested: Electronic Initial call taken by: Delilah Shan CMA Duncan Dull),  October 06, 2009 12:17 PM  Follow-up for Phone Call        Rx called to pharmacy, [facility] [phone]. Follow-up by: Linde Gillis CMA Duncan Dull),  October 06, 2009 12:24 PM    Prescriptions: SKELAXIN 800 MG  TABS (METAXALONE) take 1 by mouth three times a day Brand medically necessary #90 x 0   Entered and Authorized by:   Kerby Nora MD   Signed by:   Kerby Nora MD on 10/06/2009   Method used:   Telephoned to ...       [facility]  [address] [id number] (retail)       [address]       [place], [place]  [address]       Ph: [phone]       Fax: [phone]   RxID:   [id number]

## 2010-10-04 NOTE — Miscellaneous (Signed)
Summary: PAP/[facility]  PAP/[facility]   Imported By: Sherian Rein 04/11/2010 13:21:00  _____________________________________________________________________  External Attachment:    Type:   Image     Comment:   External Document

## 2010-10-04 NOTE — Progress Notes (Signed)
  Phone Note Refill Request Message from:  [facility] on May 23, 2010 1:01 PM  Refills Requested: Medication #1:  SKELAXIN 800 MG  TABS take 1 by mouth three times a day [BMN]   Supply Requested: 1 month [facility] [address]   Method Requested: Electronic Initial call taken by: Benny Lennert CMA Duncan Dull),  May 23, 2010 1:01 PM  Follow-up for Phone Call        Rx called to pharmacy Follow-up by: Benny Lennert CMA Duncan Dull),  May 24, 2010 8:31 AM    Prescriptions: SKELAXIN 800 MG  TABS (METAXALONE) take 1 by mouth three times a day Brand medically necessary #90 x 0   Entered and Authorized by:   Kerby Nora MD   Signed by:   Kerby Nora MD on 05/24/2010   Method used:   Telephoned to ...       [facility] [address] [phone]* (retail)       [address] [place]  [address]       Ph: [phone]       Fax: [phone]   RxID:   [id number]
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
## 2010-10-06 NOTE — Progress Notes (Signed)
Summary: skelaxin  Phone Note Refill Request Message from:  [facility] on September 12, 2010 7:45 AM  Refills Requested: Medication #1:  SKELAXIN 800 MG  TABS take 1 by mouth three times a day [BMN]   [facility]ed: [facility]   Method Requested: Electronic Initial call taken by: Benny Lennert CMA Duncan Dull),  September 12, 2010 7:46 AM  Follow-up for Phone Call        Rx called to pharmacy Follow-up by: Benny Lennert CMA (AAMA),  September 13, 2010 8:30 AM    Prescriptions: SKELAXIN 800 MG  TABS (METAXALONE) take 1 by mouth three times a day Brand medically necessary #90 x 0   Entered and Authorized by:   Kerby Nora MD   Signed by:   Kerby Nora MD on 09/12/2010   Method used:   Telephoned to ...       [facility] [address]* (retail)       [address]       [place], [place]  [address]       Ph: [phone]       Fax: [phone]   RxID:   [id number]

## 2010-10-13 ENCOUNTER — Encounter: Payer: Self-pay | Admitting: Family Medicine

## 2010-10-31 ENCOUNTER — Telehealth: Payer: Self-pay | Admitting: Family Medicine

## 2010-11-01 ENCOUNTER — Telehealth: Payer: Self-pay | Admitting: Family Medicine

## 2010-11-10 NOTE — Progress Notes (Signed)
Summary: skelaxin  Phone Note Refill Request Message from:  [facility] on October 31, 2010 7:49 AM  Refills Requested: Medication #1:  SKELAXIN 800 MG  TABS take 1 by mouth three times a day [BMN]   [facility]ed[facility]   Method Requested: Electronic Initial call taken by: Benny Lennert CMA Duncan Dull),  October 31, 2010 7:50 AM  Follow-up for Phone Call        Rx called to pharmacy Follow-up by: Benny Lennert CMA Duncan Dull),  October 31, 2010 8:25 AM    Prescriptions: SKELAXIN 800 MG  TABS (METAXALONE) take 1 by mouth three times a day Brand medically necessary #90 x 0   Entered and Authorized by:   Kerby Nora MD   Signed by:   Kerby Nora MD on 10/31/2010   Method used:   Telephoned to ...       [facility] [address]* (retail)       [address]       [place], [place]  [address]       Ph: [phone]       Fax: [phone]   RxID:   [id number]

## 2010-11-10 NOTE — Progress Notes (Signed)
Summary: needs to change dynacirc  Phone Note Refill Request Message from:  Fax from Pharmacy  Refills Requested: Medication #1:  DYNACIRC CR 5 MG  TB24 Take 2 tablet by mouth two times a day Faxed form from [facility] is on your desk.  Dynacirc is not available from the manufacturer.  Initial call taken by: Lowella Petties CMA, AAMA,  November 01, 2010 11:43 AM  Follow-up for Phone Call        Form completed.. change to generic.  Please make sure this is okay with pt though before sent in. Follow-up by: Kerby Nora MD,  November 01, 2010 12:58 PM  Additional Follow-up for Phone Call Additional follow up Details #1::        Patient says that you changed her from the generic to the [facility] because of an allergic reaction so she said that she will have it sent to a local pharmacy when she runs out.Consuello Masse CMA   Additional Follow-up by: Benny Lennert CMA Duncan Dull),  November 01, 2010 1:37 PM    Additional Follow-up for Phone Call Additional follow up Details #2::    I though there might be an issue. Okay to send elsewhere.. but per note it was a mnufacturing issue .. she may not be able to get brand anywhere. if not .. have her let me know and we can put her on a diferent BP med.  Change back in EMR please.  Follow-up by: Kerby Nora MD,  November 01, 2010 1:46 PM  New/Updated Medications: ISRADIPINE 5 MG CAPS (ISRADIPINE) 1 tab by mouth two times a day DYNACIRC CR 5 MG XR24H-TAB (ISRADIPINE) take one tablet 2 times daily

## 2010-11-24 ENCOUNTER — Other Ambulatory Visit (INDEPENDENT_AMBULATORY_CARE_PROVIDER_SITE_OTHER): Payer: Medicare Other | Admitting: Family Medicine

## 2010-11-24 DIAGNOSIS — E7800 Pure hypercholesterolemia, unspecified: Secondary | ICD-10-CM

## 2010-11-24 LAB — BASIC METABOLIC PANEL
BUN: 21 mg/dL (ref 6–23)
Calcium: 9.3 mg/dL (ref 8.4–10.5)
Creatinine, Ser: 0.7 mg/dL (ref 0.4–1.2)
GFR: 92.15 mL/min (ref >60.00)
Glucose, Bld: 93 mg/dL (ref 70–99)

## 2010-11-24 LAB — HEPATIC FUNCTION PANEL
ALT: 24 U/L (ref 0–35)
AST: 21 U/L (ref 0–37)
Bilirubin, Direct: 0.1 mg/dL (ref 0.0–0.3)
Total Bilirubin: 0.9 mg/dL (ref 0.3–1.2)

## 2010-11-24 LAB — LIPID PANEL
Cholesterol: 161 mg/dL (ref 0–200)
HDL: 66.7 mg/dL (ref >39.00)

## 2010-11-25 NOTE — Progress Notes (Signed)
Patient is feeling fine but, has had a few palpations also patient has been drinking a lot of water and completely cut out all salt

## 2010-11-28 ENCOUNTER — Other Ambulatory Visit: Payer: Self-pay | Admitting: Family Medicine

## 2010-11-28 DIAGNOSIS — E871 Hypo-osmolality and hyponatremia: Secondary | ICD-10-CM

## 2010-11-29 ENCOUNTER — Other Ambulatory Visit (INDEPENDENT_AMBULATORY_CARE_PROVIDER_SITE_OTHER): Payer: Medicare Other | Admitting: Family Medicine

## 2010-11-29 DIAGNOSIS — E871 Hypo-osmolality and hyponatremia: Secondary | ICD-10-CM

## 2010-11-29 LAB — BASIC METABOLIC PANEL
Calcium: 9.6 mg/dL (ref 8.4–10.5)
Creatinine, Ser: 0.8 mg/dL (ref 0.4–1.2)
GFR: 74.03 mL/min (ref >60.00)

## 2010-11-30 ENCOUNTER — Telehealth: Payer: Self-pay | Admitting: *Deleted

## 2010-11-30 ENCOUNTER — Encounter (INDEPENDENT_AMBULATORY_CARE_PROVIDER_SITE_OTHER): Payer: Self-pay | Admitting: *Deleted

## 2010-11-30 NOTE — Telephone Encounter (Signed)
I am ok with holding it until Tues.  I will also forward to Dr. Ermalene Searing in case she disagrees.

## 2010-11-30 NOTE — Telephone Encounter (Signed)
Patient says that the Dynacirc is on back order with all the pharmacies. She runs out of medications on Friday. She has appt. With Dr. Ermalene Searing on Tuesday of next week. She is asking if it is okay to go without med from Friday until Tuesday when she sees Dr. Ermalene Searing to decide what she could take in place of it because she has had so many allergic reactions to so many things. I advised her that it would be okay to go without it for a few days, since she also on other BPmeds.

## 2010-12-01 NOTE — Telephone Encounter (Signed)
Patient advised.

## 2010-12-01 NOTE — Telephone Encounter (Signed)
Agreed.. Will discuss at upcoming OV.

## 2010-12-06 ENCOUNTER — Ambulatory Visit (INDEPENDENT_AMBULATORY_CARE_PROVIDER_SITE_OTHER): Payer: Medicare Other | Admitting: Family Medicine

## 2010-12-06 ENCOUNTER — Encounter: Payer: Self-pay | Admitting: Family Medicine

## 2010-12-06 DIAGNOSIS — E871 Hypo-osmolality and hyponatremia: Secondary | ICD-10-CM

## 2010-12-06 DIAGNOSIS — E7800 Pure hypercholesterolemia, unspecified: Secondary | ICD-10-CM

## 2010-12-06 DIAGNOSIS — I10 Essential (primary) hypertension: Secondary | ICD-10-CM

## 2010-12-06 NOTE — Progress Notes (Signed)
  Subjective:    Patient ID: [name] [name], female    DOB: 1941/08/21, 70 y.o.   MRN: [id number]  HPI    Review of Systems     Objective:   Physical Exam  Constitutional: Vital signs are normal. She appears well-developed and well-nourished.  HENT:  Head: Normocephalic.  Right Ear: Hearing, tympanic membrane, external ear and ear canal normal.  Left Ear: Hearing, tympanic membrane, external ear and ear canal normal.  Nose: Nose normal. No rhinorrhea.  Mouth/Throat: Oropharynx is clear and moist and mucous membranes are normal. No oropharyngeal exudate.  Neck: Trachea normal. Carotid bruit is not present. No mass and no thyromegaly present.  Cardiovascular: Normal rate, regular rhythm, normal heart sounds and normal pulses.  Exam reveals no gallop, no distant heart sounds and no friction rub.   No murmur heard. Pulmonary/Chest: Effort normal and breath sounds normal. No respiratory distress.  Abdominal: Soft. Normal appearance.  Skin: Skin is warm, dry and intact. No rash noted.  Psychiatric: She has a normal mood and affect. Her speech is normal and behavior is normal. Thought content normal.          Assessment & Plan:

## 2010-12-06 NOTE — Assessment & Plan Note (Signed)
Most likely due to increased water and sodium restriction... Now corrected. Maxide may also be contributing... Will check again today..  If still low will stop maxide and go ahead and increase losartan.

## 2010-12-06 NOTE — Patient Instructions (Addendum)
Follow BP at home... If BP >140/90 x 3 or more...increase losartan to 50 mg daily.  If increased losartan.. Call for BMET Dx 401.1 about  7-10 days after increase. We will call with sodium results...may need to stop maxide.

## 2010-12-06 NOTE — Assessment & Plan Note (Signed)
Moderate control off dynacirc... Continue to follow. Increase losartan if BPs running above 140/90.

## 2010-12-06 NOTE — Assessment & Plan Note (Signed)
Much improved control on red yeast rice. Continue excellent work.

## 2010-12-06 NOTE — Progress Notes (Signed)
  Subjective:    Patient ID: [name] [name], female    DOB: 04-06-1941, 70 y.o.   MRN: [id number]  HPI  HTN, well controlled  OFF Dynacirc (no longer being made) 133/63-141/75...  She remains on Cozaar and Maxide. She wishes to hold off on BP med if able  Low sodium, new: Noted on labs..the patient with no symptoms noted...except maybe foot, abdominal  cramping, nausea. She had been restricting NA and drinking a lot of water....since we told her to make changes...with not restricting NA and decreasing water.  Last NA almost in nml range.  High cholesterol, now almost at goal <70... Much better on 2400 mg daily of red yeast rice. No SE.  Continued healthy diet and exercise.     Review of Systems  Constitutional: Negative for fever, fatigue and unexpected weight change.  HENT: Negative for ear pain, congestion, sore throat, sneezing, trouble swallowing and sinus pressure.   Eyes: Negative for pain and itching.  Respiratory: Negative for cough, shortness of breath and wheezing.   Cardiovascular: Negative for chest pain, palpitations and leg swelling.  Gastrointestinal: Negative for nausea, abdominal pain, diarrhea, constipation and blood in stool.  Genitourinary: Negative for dysuria, hematuria, vaginal discharge, difficulty urinating and menstrual problem.  Skin: Negative for rash.  Neurological: Negative for syncope, weakness, light-headedness, numbness and headaches.  Psychiatric/Behavioral: Negative for confusion and dysphoric mood. The patient is not nervous/anxious.        Objective:   Physical Exam        Assessment & Plan:

## 2010-12-06 NOTE — Letter (Signed)
Summary: Appointment - Reminder 2  [facility], Main Office  [address]   [place], [place] [address]   Phone: [phone]  Fax: [phone]     November 30, 2010 MRN: [id number]   [facility] [address] [place], [place]  [address]   Dear Ms. Lister,  Our records indicate that it is time to schedule a follow-up appointment.  Dr.Bedsole recommended that you follow up with [place] in April. It is very important that we reach you to schedule this appointment. We look forward to participating in your health care needs. Please contact us at the number listed above at your earliest convenience to schedule your appointment.  If you are unable to make an appointment at this time, give [place] a call so we can update our records.     Sincerely,   Glass blower/designer

## 2010-12-07 ENCOUNTER — Telehealth: Payer: Self-pay | Admitting: Family Medicine

## 2010-12-07 DIAGNOSIS — I10 Essential (primary) hypertension: Secondary | ICD-10-CM

## 2010-12-07 LAB — BASIC METABOLIC PANEL
BUN: 27 mg/dL — ABNORMAL HIGH (ref 6–23)
GFR: 67.27 mL/min (ref >60.00)
Potassium: 4.6 mEq/L (ref 3.5–5.1)
Sodium: 133 mEq/L — ABNORMAL LOW (ref 135–145)

## 2010-12-07 NOTE — Telephone Encounter (Addendum)
Notify pt that her sodium remains low. Let us have her stop the maxide... Have ahead and increase dose of losartan to 50 mg. Make sure she has BMET scheduled in 7-10 days after increasing losartan. Order entered in EMR and med list updated.

## 2010-12-08 NOTE — Telephone Encounter (Signed)
Patient advised.

## 2010-12-13 ENCOUNTER — Other Ambulatory Visit: Payer: Self-pay | Admitting: Family Medicine

## 2010-12-16 ENCOUNTER — Other Ambulatory Visit: Payer: Medicare Other

## 2010-12-19 ENCOUNTER — Other Ambulatory Visit (INDEPENDENT_AMBULATORY_CARE_PROVIDER_SITE_OTHER): Payer: Medicare Other | Admitting: Family Medicine

## 2010-12-19 DIAGNOSIS — I10 Essential (primary) hypertension: Secondary | ICD-10-CM

## 2010-12-19 LAB — BASIC METABOLIC PANEL
CO2: 25 mEq/L (ref 19–32)
Calcium: 9.4 mg/dL (ref 8.4–10.5)
Chloride: 102 mEq/L (ref 96–112)
Potassium: 4.4 mEq/L (ref 3.5–5.1)
Sodium: 137 mEq/L (ref 135–145)

## 2010-12-23 ENCOUNTER — Encounter: Payer: Self-pay | Admitting: Family Medicine

## 2010-12-23 ENCOUNTER — Ambulatory Visit (INDEPENDENT_AMBULATORY_CARE_PROVIDER_SITE_OTHER): Payer: Medicare Other | Admitting: Family Medicine

## 2010-12-23 VITALS — BP 130/72 | HR 83 | Temp 97.6°F | Ht 64.0 in | Wt 175.4 lb

## 2010-12-23 DIAGNOSIS — I10 Essential (primary) hypertension: Secondary | ICD-10-CM

## 2010-12-23 DIAGNOSIS — E871 Hypo-osmolality and hyponatremia: Secondary | ICD-10-CM

## 2010-12-23 DIAGNOSIS — R350 Frequency of micturition: Secondary | ICD-10-CM

## 2010-12-23 DIAGNOSIS — R109 Unspecified abdominal pain: Secondary | ICD-10-CM | POA: Insufficient documentation

## 2010-12-23 LAB — POCT URINALYSIS DIPSTICK
Glucose, UA: NEGATIVE
Ketones, UA: NEGATIVE
Protein, UA: NEGATIVE
Spec Grav, UA: 1.025
Urobilinogen, UA: NEGATIVE

## 2010-12-23 NOTE — Patient Instructions (Signed)
Continue to follow BP at home.. Check at rest... Call in 1 week with BP measurements. Call if urinary frequency not improving with time. Avoid bladder irritants such as citris, tomato, peppermint, caffeine, alcohol, chocolate. 2000-3000 mg sodium  Daily. Stop by front desk to speak with Shirlee Limerick about renal US.

## 2010-12-23 NOTE — Progress Notes (Signed)
  Subjective:    Patient ID: [name] [name], female    DOB: December 10, 1940, 70 y.o.   MRN: [id number]  HPI  Urinary frequency over the last few weeks... Ever since she changed to losartan... Worsened since losartan increase to 50 mg daily. She is no longer on maxide or dyazide dure to insurance and hyponatremia.  1-2 times a day...grabbing pain in urethra.. No dysuria, no blood urine. No fever. Ache in left CVA in last month.  Has history of frequent kidney infections in 1990s....told from renal US cyst in left kidney.  No history of polycystic kidney disease. Hyponatremia.Marland Kitchen Resolved off maxide.. NA 137.  Blood pressures at home 156/81, 148/72, 150/73... But she states at home she does not sit for long before taking it at home as she does here.   Review of Systems  Constitutional: Negative for fever and fatigue.  HENT: Negative for ear pain.   Eyes: Negative for pain.  Respiratory: Negative for chest tightness and shortness of breath.   Cardiovascular: Negative for chest pain, palpitations and leg swelling.  Gastrointestinal: Negative for abdominal pain, diarrhea, constipation and blood in stool.  Genitourinary: Positive for frequency and flank pain. Negative for dysuria, urgency, decreased urine volume, vaginal bleeding, vaginal discharge and vaginal pain.       Objective:   Physical Exam  Constitutional: Vital signs are normal. She appears well-developed and well-nourished. She is cooperative.  Non-toxic appearance. She does not appear ill. No distress.  HENT:  Head: Normocephalic.  Right Ear: Hearing, tympanic membrane, external ear and ear canal normal. Tympanic membrane is not erythematous, not retracted and not bulging.  Left Ear: Hearing, tympanic membrane, external ear and ear canal normal. Tympanic membrane is not erythematous, not retracted and not bulging.  Nose: No mucosal edema or rhinorrhea. Right sinus exhibits no maxillary sinus tenderness and no frontal sinus  tenderness. Left sinus exhibits no maxillary sinus tenderness and no frontal sinus tenderness.  Mouth/Throat: Uvula is midline, oropharynx is clear and moist and mucous membranes are normal.  Eyes: Conjunctivae, EOM and lids are normal. Pupils are equal, round, and reactive to light. No foreign bodies found.  Neck: Trachea normal and normal range of motion. Neck supple. Carotid bruit is not present. No mass and no thyromegaly present.  Cardiovascular: Normal rate, regular rhythm, S1 normal, S2 normal, normal heart sounds, intact distal pulses and normal pulses.  Exam reveals no gallop and no friction rub.   No murmur heard. Pulmonary/Chest: Effort normal and breath sounds normal. Not tachypneic. No respiratory distress. She has no decreased breath sounds. She has no wheezes. She has no rhonchi. She has no rales.  Abdominal: Soft. Normal appearance and bowel sounds are normal. There is no hepatosplenomegaly. There is no tenderness. There is CVA tenderness. No hernia.       CVA tenderness mild on left, no radiation of pain or ttp in left abdomen  Neurological: She is alert.  Skin: Skin is warm, dry and intact. No rash noted.  Psychiatric: Her speech is normal and behavior is normal. Judgment and thought content normal. Her mood appears not anxious. Cognition and memory are normal. She does not exhibit a depressed mood.          Assessment & Plan:

## 2010-12-29 ENCOUNTER — Ambulatory Visit: Payer: Medicare Other | Admitting: Family Medicine

## 2011-01-02 ENCOUNTER — Telehealth: Payer: Self-pay | Admitting: *Deleted

## 2011-01-02 ENCOUNTER — Other Ambulatory Visit: Payer: Self-pay | Admitting: Cardiology

## 2011-01-02 DIAGNOSIS — I6529 Occlusion and stenosis of unspecified carotid artery: Secondary | ICD-10-CM

## 2011-01-02 NOTE — Assessment & Plan Note (Addendum)
Inadequate control (per home numbers although usually checking after she has been very active) on losartan despite higher dose and also ? of SE at higher dose. Pt resistant to making another cahnge with BP med at this time... Will instead continue to follow BP at home.. Check at rest... Call in 1 week with BP measurements.

## 2011-01-02 NOTE — Assessment & Plan Note (Addendum)
No clear sign of infection in urine on UA today. No blood suggesting renal stoneand pt wil mild pain in CVA. Will send for [place] of left kidney given history of renal cyst fairly large.

## 2011-01-02 NOTE — Telephone Encounter (Signed)
Patient recent Blood pressure readings  146/86                                        149/81 Today  147/75  165/79  147/77  163/79  145/79  153/85

## 2011-01-02 NOTE — Assessment & Plan Note (Signed)
Resolved off maxide

## 2011-01-03 ENCOUNTER — Encounter: Payer: BC Managed Care – PPO | Admitting: *Deleted

## 2011-01-03 ENCOUNTER — Telehealth: Payer: Self-pay | Admitting: Cardiovascular Disease

## 2011-01-03 NOTE — Telephone Encounter (Signed)
LMOM to reschedule missed appt for a Carotid ultrasound from  01/03/11.

## 2011-01-04 NOTE — Telephone Encounter (Signed)
Can we follow up on carotid u/s scheduling

## 2011-01-04 NOTE — Telephone Encounter (Signed)
Notify pt that BP is still elevated even when measuring at rest.. How is she feeling on losartan? Is she willing to increase this dose or should we add addifferent med or change meds entirely as discussed at last OV.

## 2011-01-05 NOTE — Telephone Encounter (Signed)
Patient says that she has had shortness of Breathe with activity and dizzy with lightheaded. Patient says she thinks it is what ever you think is best.

## 2011-01-06 ENCOUNTER — Other Ambulatory Visit: Payer: Self-pay | Admitting: Family Medicine

## 2011-01-06 ENCOUNTER — Telehealth: Payer: Self-pay | Admitting: Family Medicine

## 2011-01-06 MED ORDER — AMLODIPINE BESYLATE 5 MG PO TABS: 5.0000 mg | ORAL_TABLET | Freq: Every day | ORAL | Status: DC

## 2011-01-06 NOTE — Telephone Encounter (Signed)
Patient is okay with amlodipine BNM [facility] [place] 30 DAY SUPPLY

## 2011-01-06 NOTE — Telephone Encounter (Signed)
Notify pt that we will need to stop losartan. Has she tried amlodipine in past?  I do not see it in her records. Herbert Seta can you check Centricity to see if listed in all meds in past?I could not login for some reason)  We can try this next given we cannot use ACEI, ARB, diuretics, BBlockers given past SE and

## 2011-01-06 NOTE — Telephone Encounter (Signed)
Addended byKerby Nora on: 01/06/2011 02:09 PM   Modules accepted: Orders

## 2011-01-17 ENCOUNTER — Encounter (INDEPENDENT_AMBULATORY_CARE_PROVIDER_SITE_OTHER): Payer: Medicare Other | Admitting: *Deleted

## 2011-01-17 DIAGNOSIS — I6529 Occlusion and stenosis of unspecified carotid artery: Secondary | ICD-10-CM

## 2011-01-18 ENCOUNTER — Telehealth: Payer: Self-pay | Admitting: Family Medicine

## 2011-01-18 ENCOUNTER — Encounter: Payer: Self-pay | Admitting: Family Medicine

## 2011-01-18 DIAGNOSIS — E039 Hypothyroidism, unspecified: Secondary | ICD-10-CM

## 2011-01-18 DIAGNOSIS — I6529 Occlusion and stenosis of unspecified carotid artery: Secondary | ICD-10-CM

## 2011-01-18 NOTE — Telephone Encounter (Signed)
Patient calling saying that when she went for carotid doppler yesterday they took her bp and it was 178/90 in right arm and 176/90 in left arm. Patient says that she has been taking it at home and the highest its been is 165/79 and lowest is 139/77. Patient also wants to know how much water she can drink a day since her sodium is back up. Please advised

## 2011-01-18 NOTE — Telephone Encounter (Signed)
Have her increase amlodipine to 10 mg daily (change in med list and refill if needed).  She can drink 32 to 48 oz a day of water.

## 2011-01-18 NOTE — Telephone Encounter (Signed)
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 Recommend recheck in 6 months for stability.  Also thyroid appeared abnormal and thyroid dedicated US was recommended to eval further.

## 2011-01-19 NOTE — Telephone Encounter (Signed)
Patient advised.

## 2011-01-20 ENCOUNTER — Ambulatory Visit: Payer: Medicare Other | Admitting: Family Medicine

## 2011-01-20 NOTE — Assessment & Plan Note (Signed)
[facility]                           GASTROENTEROLOGY OFFICE NOTE   NAME:[name] [name], [name] [name]                        MRN:          [id number]  DATE:04/19/2006                            DOB:          [birth year]    REFERRAL:  Self-referred.   CHIEF COMPLAINT:  History of colon polyps.   HISTORY:  This is a 70 year old white female who has a history of colon  polyps as well as a family history of colon cancer in her mother, who  apparently developed it at age 72.  She has been having regular  colonoscopies.  She has brought records from [place], [place],  where she used to reside.  I see a procedure report from June 29, 2003,  which describes Dr. Geronimo Running. Goodman's colonoscopy of the patient that showed  sigmoid diverticulosis, a single right-sided diverticulum, internal  hemorrhoids, and 4 or 5 small, flat rectal and sigmoid polyps that were  obliterated but no pathology specimen was obtained.  In 2002, she had a 2 mm  rectal polyp that was removed with snare and was hyperplastic.  Prior to  that, in November, 2001, she had an ascending colon polyp that was broad-  based and was a mixture of a adenomatous and hyperplastic polyp.  She has no  active GI symptoms at this time.   MEDICATIONS:  1. DynaCirc 5 mg b.i.d.  2. Maxzide daily.  3. Protonix daily.  4. Celebrex daily.  5. Skelaxin p.r.n.  6. Zetia daily.   DRUG ALLERGIES:  She describes an allergy or sensitivity to ACE INHIBITORS.   PAST MEDICAL HISTORY:  1. Colon polyps.  2. Hypertension.  3. Dyslipidemia.  4. Osteoarthritis.  5. Sleep apnea.  6. Cholecystectomy in 2004.   Please see my medical history and physical form in the [facility] chart for  other details of the history at this time.  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   FAMILY HISTORY:  Noncontributory other than that mentioned above, although a  brother did  have prostate cancer.   PHYSICAL EXAMINATION:  VITAL SIGNS:  Height 5 foot 5.  Weight 169 pounds.  Blood pressure 152/88, pulse 80.  GENERAL:  An overweight elderly white female.  HEENT:  Eyes anicteric.  ENT:  Normal mouth, oropharynx.  NECK:  Supple.  No mass.  CHEST:  Clear.  CARDIOVASCULAR:  S1 and S2.  No gallops.  ABDOMEN:  Somewhat obese.  Soft and nontender without organomegaly or mass.  EXTREMITIES:  No edema.  SKIN:  No rash.   ASSESSMENT:  Personal history of colon polyps and a family history of colon  cancer.  It is hard to know what type of polyps she had three years ago  because they were not biopsied.   PLAN:  Given that, we will go ahead with a colonoscopy now, although she  understands that it is potentially possible to wait two years, but without  the pathology, I think we should proceed.  She understands the risks,  benefits and indications and agrees to proceed.  If this examination is  normal or hyperplastic polyps are found, I would wait five years before  pursuing a routine colonoscopy.                                   Iva Boop, MD, [facility]   CEG/MedQ  DD:  04/19/2006  DT:  04/19/2006  Job #:  [id number]
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
## 2011-01-20 NOTE — Telephone Encounter (Signed)
Appt [facility] for thyroid US on 01/20/2011. MK

## 2011-01-24 ENCOUNTER — Other Ambulatory Visit: Payer: Self-pay | Admitting: *Deleted

## 2011-01-24 ENCOUNTER — Telehealth: Payer: Self-pay | Admitting: *Deleted

## 2011-01-24 DIAGNOSIS — E042 Nontoxic multinodular goiter: Secondary | ICD-10-CM

## 2011-01-24 MED ORDER — AMLODIPINE BESYLATE 10 MG PO TABS: 10.0000 mg | ORAL_TABLET | Freq: Every day | ORAL | Status: DC

## 2011-01-24 NOTE — Telephone Encounter (Signed)
Patient would like referral to endocrinologist

## 2011-01-25 ENCOUNTER — Encounter: Payer: Self-pay | Admitting: Family Medicine

## 2011-01-26 NOTE — Telephone Encounter (Signed)
Appt Dr Tedd Sias [facility] Endocrinology on 02/20/2011, patient notified. MK

## 2011-01-27 ENCOUNTER — Other Ambulatory Visit: Payer: Self-pay | Admitting: Family Medicine

## 2011-01-31 ENCOUNTER — Ambulatory Visit (INDEPENDENT_AMBULATORY_CARE_PROVIDER_SITE_OTHER): Payer: Medicare Other | Admitting: Family Medicine

## 2011-01-31 ENCOUNTER — Encounter: Payer: Self-pay | Admitting: Family Medicine

## 2011-01-31 DIAGNOSIS — I10 Essential (primary) hypertension: Secondary | ICD-10-CM

## 2011-01-31 DIAGNOSIS — M7989 Other specified soft tissue disorders: Secondary | ICD-10-CM

## 2011-01-31 NOTE — Assessment & Plan Note (Signed)
Well controlled. Continue current medication.  

## 2011-01-31 NOTE — Progress Notes (Signed)
  Subjective:    Patient ID: [name] [name], female    DOB: Jan 13, 1941, 70 y.o.   MRN: [id number]  HPI  Hypertension: Doing better on amlodipine. Minimal swelling  in ankles    On amlodipine 10 mg daily ( past allergy to ACEI, SE to ARB, Intolerant to BBlockers) Chest pain with exertion: None Edema:None Short of breath:None off losartan Average home BPs: 147/76 Other issues:  Going to [facility] three times a week, but not yet back on treadmill... Eating healthy diet. Has lost 5 lbs on weight watchers in last few months.     Review of Systems  Constitutional: Negative for fever and fatigue.  Respiratory: Negative for chest tightness and shortness of breath.   Cardiovascular: Negative for chest pain, palpitations and leg swelling.  Gastrointestinal: Negative for abdominal pain.  Genitourinary: Negative for dysuria.       Objective:   Physical Exam  Constitutional: Vital signs are normal. She appears well-developed and well-nourished. She is cooperative.  Non-toxic appearance. She does not appear ill. No distress.  HENT:  Head: Normocephalic.  Right Ear: Hearing, tympanic membrane, external ear and ear canal normal. Tympanic membrane is not erythematous, not retracted and not bulging.  Left Ear: Hearing, tympanic membrane, external ear and ear canal normal. Tympanic membrane is not erythematous, not retracted and not bulging.  Nose: No mucosal edema or rhinorrhea. Right sinus exhibits no maxillary sinus tenderness and no frontal sinus tenderness. Left sinus exhibits no maxillary sinus tenderness and no frontal sinus tenderness.  Mouth/Throat: Uvula is midline, oropharynx is clear and moist and mucous membranes are normal.  Eyes: Conjunctivae, EOM and lids are normal. Pupils are equal, round, and reactive to light. No foreign bodies found.  Neck: Trachea normal and normal range of motion. Neck supple. Carotid bruit is not present. No mass and no thyromegaly present.  Cardiovascular:  Normal rate, regular rhythm, S1 normal, S2 normal, normal heart sounds, intact distal pulses and normal pulses.  Exam reveals no gallop and no friction rub.   No murmur heard.      Mild peripheral swelling in left ankle chronic. None in right  Pulmonary/Chest: Effort normal and breath sounds normal. Not tachypneic. No respiratory distress. She has no decreased breath sounds. She has no wheezes. She has no rhonchi. She has no rales.  Abdominal: Soft. Normal appearance and bowel sounds are normal. There is no tenderness.  Neurological: She is alert.  Skin: Skin is warm, dry and intact. No rash noted.  Psychiatric: Her speech is normal and behavior is normal. Judgment and thought content normal. Her mood appears not anxious. Cognition and memory are normal. She does not exhibit a depressed mood.          Assessment & Plan:

## 2011-01-31 NOTE — Patient Instructions (Signed)
Continue great work on lifestyle changes.

## 2011-02-06 ENCOUNTER — Encounter: Payer: Self-pay | Admitting: Family Medicine

## 2011-02-06 ENCOUNTER — Ambulatory Visit (INDEPENDENT_AMBULATORY_CARE_PROVIDER_SITE_OTHER): Payer: Medicare Other | Admitting: Family Medicine

## 2011-02-06 VITALS — BP 132/82 | HR 82 | Temp 97.9°F | Wt 167.4 lb

## 2011-02-06 DIAGNOSIS — Z981 Arthrodesis status: Secondary | ICD-10-CM

## 2011-02-06 DIAGNOSIS — Z9889 Other specified postprocedural states: Secondary | ICD-10-CM

## 2011-02-06 DIAGNOSIS — M545 Low back pain: Secondary | ICD-10-CM

## 2011-02-06 MED ORDER — DICLOFENAC SODIUM 75 MG PO TBEC: 75.0000 mg | DELAYED_RELEASE_TABLET | Freq: Two times a day (BID) | ORAL | Status: DC

## 2011-02-06 MED ORDER — CYCLOBENZAPRINE HCL 10 MG PO TABS: 10.0000 mg | ORAL_TABLET | Freq: Three times a day (TID) | ORAL | Status: AC | PRN

## 2011-02-06 NOTE — Patient Instructions (Signed)
REFERRAL: GO THE THE FRONT ROOM AT THE ENTRANCE OF OUR CLINIC, NEAR CHECK IN. ASK FOR [name] [name]. SHE WILL HELP YOU SET UP YOUR REFERRAL. DATE: TIME:  

## 2011-02-06 NOTE — Progress Notes (Signed)
70 year old female:  Has had some back issues off and on for a long time.  On Thursday, started to get worse. Then on Friday, got worse and did not exercise class.  Gotten only a few hours of sleep each night. Left buttocks.  Has not had an episode in 2006. Saw Dr. Noel Gerold then.   Got up on her feet relatively quickly.  Was doing some repetitive squat motions in the gym end of last week - sumo type leg lifts while standing. Hurt the next day.  Baseline scoliosis L ankle fusion, 1967 post significant traumatic fracture.  No bowel / bladder incontinence. No anesthesia.  Patient Active Problem List  Diagnoses  . COLONIC POLYPS  . HYPOTHYROIDISM  . HYPERCHOLESTEROLEMIA  . OBSTRUCTIVE SLEEP APNEA  . HYPERTENSION  . CAROTID ARTERY STENOSIS, RIGHT  . HEMORRHOIDS, INTERNAL, WITH BLEEDING  . TEMPOROMANDIBULAR JOINT PAIN  . DIVERTICULAR DISEASE  . VAGINITIS, ATROPHIC  . OSTEOARTHRITIS  . LOW BACK PAIN, CHRONIC  . SCOLIOSIS  . VERTIGO  . ALLERGY  . Hyponatremia  . Urinary frequency  . Left flank pain  . S/P ankle fusion   Past Medical History  Diagnosis Date  . Hemorrhoids, internal, with bleeding   . Diverticulitis   . Vertigo   . Chronic lower back pain   . Scoliosis   . Vaginitis, atrophic   . OSA (obstructive sleep apnea)   . PUD (peptic ulcer disease)   . Arthritis   . GERD (gastroesophageal reflux disease)   . Hiatal hernia   . HLD (hyperlipidemia)   . Hypertension   . History of colon polyps   . Thyroid disease     hypo   Past Surgical History  Procedure Date  . Cholecystectomy     2004  . Fracture surgery     foot and ankle surgery  . Ankle fusion 1967    LEFT   History  Substance Use Topics  . Smoking status: Never Smoker   . Smokeless tobacco: Not on file  . Alcohol Use: Yes     wine occassionally   Family History  Problem Relation Age of Onset  . Cancer Mother     colon   . Heart disease Father     cad   Allergies  Allergen Reactions  .  Ace Inhibitors     REACTION: swelling  . Beta Adrenergic Blockers     REACTION: swelling  . Statins     REACTION: myalgia   Current Outpatient Prescriptions on File Prior to Visit  Medication Sig Dispense Refill  . amLODipine (NORVASC) 10 MG tablet Take 1 tablet (10 mg total) by mouth daily.  30 tablet  11  . aspirin 81 MG tablet Take 81 mg by mouth daily.        . Cream Base (PHYTOBASE) CREA Apply topically daily.        . Estradiol (ESTROGEL) 0.75 MG/1.25 GM (0.06%) topical gel Place 1.25 g onto the skin daily.        . fish oil-omega-3 fatty acids 1000 MG capsule Take 1 g by mouth 2 (two) times daily.        . hydrocortisone (PROCTOSOL HC) 2.5 % rectal cream Place rectally 3 (three) times daily.        Marland Kitchen loratadine (CLARITIN) 10 MG tablet Take 10 mg by mouth daily.        . Meclizine HCl 25 MG CHEW Chew 1 tablet by mouth as needed.        Marland Kitchen  mometasone (NASONEX) 50 MCG/ACT nasal spray 2 sprays by Nasal route daily.        . pantoprazole (PROTONIX) 40 MG tablet Take 40 mg by mouth daily.        . Red Yeast Rice 600 MG CAPS Take 1 capsule by mouth daily.        . SKELAXIN 800 MG tablet TAKE 1 TABLET BY MOUTH THREE TIMES DAILY  90 tablet  0  . DISCONTD: celecoxib (CELEBREX) 200 MG capsule Take 200 mg by mouth 2 (two) times daily.        . isradipine (DYNACIRC CR) 5 MG 24 hr tablet Take 5 mg by mouth daily.         REVIEW OF SYSTEMS  GEN: No fevers, chills. Nontoxic. Primarily MSK c/o today. MSK: Detailed in the HPI GI: tolerating PO intake without difficulty Neuro: No numbness, parasthesias, or tingling associated. Otherwise the pertinent positives of the ROS are noted above.    Physical Exam  Blood pressure 132/82, pulse 82, temperature 97.9 F (36.6 C), temperature source Oral, weight 167 lb 6.4 oz (75.932 kg), SpO2 97.00%.  Gen: Well-developed,well-nourished,in no acute distress; alert,appropriate and cooperative throughout examination HEENT: Normocephalic and atraumatic  without obvious abnormalities.  Ears, externally no deformities Pulm: Breathing comfortably in no respiratory distress Range of motion at  the waist: Flexion: mild pain Extension: mild pain Rotation: mild pain  No echymosis or edema Rises to examination table with mild difficulty Gait: minimally antalgic  Inspection/Deformity: N Paraspinus T: Y - throughout L4-s1 area  B Ankle Dorsiflexion (L5,4): UNABLE TO ASSESS B Great Toe Dorsiflexion (L5,4): 5/5 UNABLE TO ASSESS HEEL WALK OR TOE WALK Rise/Squat (L4): WNL, mild pain  SENSORY B Medial Foot (L4): WNL B Dorsum (L5): WNL B Lateral (S1): WNL Light Touch: WNL  B SLR, seated: neg B SLR, supine: neg B FABER: neg B Reverse FABER: neg B Greater Troch: NT B Log Roll: neg B Sciatic Notch: ttp  A/P: Acute on chronic low back pain. No red flags. Not c/w disk  Change to Voltaren Flexeril at night PT

## 2011-02-22 ENCOUNTER — Encounter: Payer: Medicare Other | Admitting: Orthopedic Surgery

## 2011-02-23 ENCOUNTER — Telehealth: Payer: Self-pay | Admitting: *Deleted

## 2011-02-23 NOTE — Telephone Encounter (Signed)
Very unlikely to be withdrawal given short duration of treatment.  Even so, as 4 days without med, should be towards end of withdrawal. However if desired could do short taper to see if we can help sxs. Does she have tramadol left?  If so, take 3 pills for 1 day then 2 pills for 1 day then 1 pill for 2 days then stop. If not, send in refill.  Tramadol 50mg  tid x 1 day then bid x 1 day then qd x 2 days.  #7, RF 0.

## 2011-02-23 NOTE — Telephone Encounter (Signed)
Spoke with patient. She absolutely refused the taper and said she just hopes she gets over the symptoms she is having. She said she has no appetite and is having trouble sleeping along with shakiness and thirsty all the time. I encouraged her to come in for an office visit to address these symptoms tomorrow since they may not even be related to the tramadol. She agreed and appt was scheduled.

## 2011-02-23 NOTE — Telephone Encounter (Signed)
Patient says that she was seen by her orthopedic Dr. For her back pain and they put her on tramadol 50 mg. She took 1 every 6 hrs from June 7 th through June 17 th and she really didn't like the way she felt while taking it because it made her constipated, and left a really bitter taste in her mouth, and she was unable to eat. She says that she lost 10 lbs while taking it. She says that she stopped taking it on 6-17 and since then she has been really shaky, feels like crawling, "her insides are crawling". She says that she called her orthopedist dr. And they told her that she couldn't be having withdrawals because she was on a low dose for a very short period of time. She is asking if it could be withdrawals and what she should do. She asked that I not send this to Dr. Patsy Lager in Dr. Daphine Deutscher absence, but to send it to Dr. Sharen Hones who is her husbands dr. Please advise.

## 2011-02-24 ENCOUNTER — Ambulatory Visit (INDEPENDENT_AMBULATORY_CARE_PROVIDER_SITE_OTHER): Payer: Medicare Other | Admitting: Family Medicine

## 2011-02-24 ENCOUNTER — Encounter: Payer: Self-pay | Admitting: Family Medicine

## 2011-02-24 DIAGNOSIS — M545 Low back pain: Secondary | ICD-10-CM

## 2011-02-24 DIAGNOSIS — T50905A Adverse effect of unspecified drugs, medicaments and biological substances, initial encounter: Secondary | ICD-10-CM

## 2011-02-24 MED ORDER — DICLOFENAC SODIUM 1 % TD GEL: 1.0000 "application " | Freq: Every evening | TRANSDERMAL | Status: DC | PRN

## 2011-02-24 NOTE — Patient Instructions (Signed)
We can try voltaren gel for hip. Use tylenol 500mg  2-3 times a day scheduled May help with pain. Keep following with physical therapy. Let us know how you do. Good to meet you today, call us with questions.

## 2011-02-24 NOTE — Progress Notes (Signed)
  Subjective:    Patient ID: [name] [name], female    DOB: [birth year], 70 y.o.   MRN: [id number]  HPI CC: reaction to tramadol?  Started on tramadol by ortho for chronic back pain - did not do well with this.  Was taking tramadol twice daily at night (one at 9pm and second in middle of night) for 10 days.  Constipated, bitter taste, bitter smell, muscle reaction, felt insides twisting and restless, heat alternating with chills, mood swings, nerves, shakey hands, mild shortness of breath.  Tapered off.    Made appointment today because yesterday was not feeling better but actually today is first day that has started to feel like old self.  BP up today but pt attributes to nerves from recent indisposition.  Still not sleeping well.  Left hip muscles throbbing and tight worse at night.  Working with physical therapy on this.  Has been having more back and hip issues, followed by ortho for this.  Thinks PT helping a lot.  Working on [employer] and quads with PT.  On diclofenac patch bid, celecoxib 200mg , and skelaxin 800mg  tid.  Doesn't want something stronger than tramadol.  Also takes tylenol prn.  When feels pain coming on, feels anxiety coming on.  Doing heating pad and ice.  Uses TENS unit as well.  No back surgeries.  H/o bad arthritis/scoliosis.  Review of Systems Per HPI    Objective:   Physical Exam  Nursing note and vitals reviewed. Constitutional: She appears well-developed and well-nourished. No distress.  HENT:  Head: Normocephalic and atraumatic.  Mouth/Throat: Oropharynx is clear and moist. No oropharyngeal exudate.  Eyes: Conjunctivae and EOM are normal. Pupils are equal, round, and reactive to light.  Neck: Normal range of motion. Neck supple.  Cardiovascular: Normal rate, regular rhythm, normal heart sounds and intact distal pulses.   No murmur heard. Pulmonary/Chest: Effort normal and breath sounds normal. No respiratory distress. She has no wheezes. She has no rales.    Musculoskeletal: Normal range of motion.       No midline spine tenderness. No paraspinous mm tenderness No GTB pain.  + L>R SIJ pain.   Main tenderness concentrated left buttock lateral and inferior to L SIJ. Neg SLR bilaterally. No pain at hip joint with int/ext rotation.  Neurological: She has normal strength. No sensory deficit. Coordination and gait normal.       CN grossly intact  Skin: Skin is warm and dry. No rash noted.  Psychiatric: She has a normal mood and affect.          Assessment & Plan:

## 2011-02-25 DIAGNOSIS — T50905A Adverse effect of unspecified drugs, medicaments and biological substances, initial encounter: Secondary | ICD-10-CM | POA: Insufficient documentation

## 2011-02-25 NOTE — Assessment & Plan Note (Signed)
Recommended stop tramadol, pt agrees. Have placed on intolerance list per pt preference. Pt states wants to avoid strong pain meds, seems to be very sensitive to these meds. Added voltaren gel and tylenol scheduled.

## 2011-02-25 NOTE — Assessment & Plan Note (Addendum)
Having trouble sleeping at night 2/2 hip pain, ?from tension/strain of left hamstrings as seems to improve with PT. On diclofenac patch, celecoxib daily, skelaxin bid to tid.  Doesn't want anything stronger than this for now. Will add on voltaren gel to use only at night to see if can facilitate better sleep. Stop tramadol.  See below. Also schedule tylenol 500mg  tid to see if any improvement in pain control.  Update [place] if not better.   F/u with PT/ortho.

## 2011-02-28 ENCOUNTER — Telehealth: Payer: Self-pay | Admitting: *Deleted

## 2011-02-28 NOTE — Telephone Encounter (Signed)
Form faxed to [employer]

## 2011-02-28 NOTE — Telephone Encounter (Signed)
Filled out and placed in my out box. 

## 2011-02-28 NOTE — Telephone Encounter (Signed)
Prior [name] [name] is needed for voltaren gel, form is in your in box.

## 2011-03-02 NOTE — Telephone Encounter (Signed)
Pt states she ended up paying for the voltaren gel, although I believe prior auth was approved.  Pt states she is still having back pain, worse at night.  Voltaren is helping some but she is unable to sleep- only sleeping about 3-4 hours a night.  She is asking if she can have something mild, for a short duration, called in to help her- perhaps valium, which was suggested to her by her friend who is a psychiatric nurse. Something to help her relax so that she can sleep.  Uses [facility] [address], wants Brand Name Only.

## 2011-03-03 MED ORDER — DIAZEPAM 2 MG PO TABS: 2.0000 mg | ORAL_TABLET | Freq: Two times a day (BID) | ORAL | Status: AC | PRN

## 2011-03-03 NOTE — Telephone Encounter (Signed)
Phoned in.  Removed skelaxin from med list.  Starting with low dose bid prn.

## 2011-03-03 NOTE — Telephone Encounter (Signed)
Pt said she does not feel the Skelaxin is working now so she would like to try something different. Pt uses [facility] and will ck with them later this afternoon about rx unless hears back from our office.

## 2011-03-03 NOTE — Telephone Encounter (Signed)
We can try valium but then I would want her to stop skelaxin as both are used as muscle relaxants. Let her decide which she would prefer to use - valium or skelaxin and update me.

## 2011-03-05 ENCOUNTER — Encounter: Payer: Medicare Other | Admitting: Orthopedic Surgery

## 2011-03-15 ENCOUNTER — Other Ambulatory Visit: Payer: Self-pay | Admitting: *Deleted

## 2011-03-15 MED ORDER — AMLODIPINE BESYLATE 10 MG PO TABS: 10.0000 mg | ORAL_TABLET | Freq: Every day | ORAL | Status: DC

## 2011-03-22 ENCOUNTER — Ambulatory Visit: Payer: Medicare Other | Admitting: Family Medicine

## 2011-03-27 ENCOUNTER — Other Ambulatory Visit: Payer: Self-pay | Admitting: Neurosurgery

## 2011-03-27 DIAGNOSIS — M545 Low back pain: Secondary | ICD-10-CM

## 2011-03-28 ENCOUNTER — Other Ambulatory Visit: Payer: Self-pay | Admitting: *Deleted

## 2011-03-28 MED ORDER — DIAZEPAM 2 MG PO TABS: 2.0000 mg | ORAL_TABLET | Freq: Two times a day (BID) | ORAL | Status: AC | PRN

## 2011-03-28 NOTE — Telephone Encounter (Signed)
What was pt taking this for in past...recent back issues or mood/sleep? She is on skelaxin as well, so I would not want to have her on both unless absolutely necessary.

## 2011-03-28 NOTE — Telephone Encounter (Signed)
Patient says that dr g gave her diazepam and she stop the skelaxin. She wants to stay off the skelaxin and do the diazepam b/c it works better.

## 2011-03-28 NOTE — Telephone Encounter (Signed)
Patient requests refill on diazepam 2 mg 1 PO BID. No longer on med list. Okay to refill?

## 2011-03-29 NOTE — Telephone Encounter (Signed)
rx called to pharmacy 

## 2011-04-04 ENCOUNTER — Ambulatory Visit
Admission: RE | Admit: 2011-04-04 | Discharge: 2011-04-04 | Disposition: A | Discharge status: Home / Self Care | Payer: Medicare Other | Source: Ambulatory Visit | Attending: Neurosurgery | Admitting: Neurosurgery

## 2011-04-04 DIAGNOSIS — M545 Low back pain: Secondary | ICD-10-CM

## 2011-04-05 ENCOUNTER — Encounter: Payer: Medicare Other | Admitting: Orthopedic Surgery

## 2011-04-26 ENCOUNTER — Other Ambulatory Visit: Payer: Self-pay | Admitting: *Deleted

## 2011-04-26 MED ORDER — PANTOPRAZOLE SODIUM 40 MG PO TBEC: 40.0000 mg | DELAYED_RELEASE_TABLET | Freq: Every day | ORAL | Status: DC

## 2011-04-26 NOTE — Telephone Encounter (Signed)
Addended by: Consuello Masse on: 04/26/2011 04:35 PM   Modules accepted: Orders

## 2011-05-06 ENCOUNTER — Encounter: Payer: Medicare Other | Admitting: Orthopedic Surgery

## 2011-05-09 ENCOUNTER — Other Ambulatory Visit: Payer: Self-pay | Admitting: Family Medicine

## 2011-05-11 NOTE — Telephone Encounter (Signed)
rx called to pharmacy 

## 2011-05-11 NOTE — Telephone Encounter (Signed)
On valium in place of skelaxin

## 2011-05-15 ENCOUNTER — Other Ambulatory Visit: Payer: Self-pay | Admitting: *Deleted

## 2011-05-15 MED ORDER — VALIUM 2 MG PO TABS: ORAL_TABLET | ORAL | Status: DC

## 2011-05-15 NOTE — Telephone Encounter (Signed)
Pt is asking that we call the pharmacy and tell them brand on only on the diazepam.

## 2011-05-15 NOTE — Telephone Encounter (Signed)
Spoke with patient. She said she had an allergic reaction to one of the inactive ingredients in the generic. The name brand doesn't cause it. Called pharmacy and sent in name brand Rx as directed.

## 2011-05-15 NOTE — Telephone Encounter (Signed)
Ok to do.  Please call.  Ask pt why wants brand (to document in case need PA)

## 2011-05-23 ENCOUNTER — Other Ambulatory Visit (INDEPENDENT_AMBULATORY_CARE_PROVIDER_SITE_OTHER): Payer: Medicare Other

## 2011-05-23 DIAGNOSIS — Z86010 Personal history of colonic polyps: Secondary | ICD-10-CM

## 2011-05-23 DIAGNOSIS — E7800 Pure hypercholesterolemia, unspecified: Secondary | ICD-10-CM

## 2011-05-23 DIAGNOSIS — I10 Essential (primary) hypertension: Secondary | ICD-10-CM

## 2011-05-23 DIAGNOSIS — E039 Hypothyroidism, unspecified: Secondary | ICD-10-CM

## 2011-05-23 LAB — CBC WITH DIFFERENTIAL/PLATELET
Basophils Absolute: 0 10*3/uL (ref 0.0–0.1)
Eosinophils Relative: 2.8 % (ref 0.0–5.0)
HCT: 40.4 % (ref 36.0–46.0)
Hemoglobin: 13.4 g/dL (ref 12.0–15.0)
Lymphs Abs: 2.5 10*3/uL (ref 0.7–4.0)
MCV: 93.1 fl (ref 78.0–100.0)
Monocytes Absolute: 0.4 10*3/uL (ref 0.1–1.0)
Monocytes Relative: 6.3 % (ref 3.0–12.0)
Neutro Abs: 3 10*3/uL (ref 1.4–7.7)
Platelets: 290 10*3/uL (ref 150.0–400.0)
RDW: 13.3 % (ref 11.5–14.6)

## 2011-05-23 LAB — BASIC METABOLIC PANEL
CO2: 24 mEq/L (ref 19–32)
Calcium: 9.4 mg/dL (ref 8.4–10.5)
Creatinine, Ser: 0.7 mg/dL (ref 0.4–1.2)
GFR: 83.35 mL/min (ref >60.00)
Glucose, Bld: 93 mg/dL (ref 70–99)
Sodium: 138 mEq/L (ref 135–145)

## 2011-05-23 LAB — HEPATIC FUNCTION PANEL
Albumin: 4.5 g/dL (ref 3.5–5.2)
Alkaline Phosphatase: 80 U/L (ref 39–117)
Total Protein: 7.5 g/dL (ref 6.0–8.3)

## 2011-05-23 LAB — LIPID PANEL
Cholesterol: 182 mg/dL (ref 0–200)
HDL: 62.1 mg/dL (ref >39.00)
Triglycerides: 102 mg/dL (ref 0.0–149.0)

## 2011-05-30 ENCOUNTER — Encounter: Payer: Medicare Other | Admitting: Family Medicine

## 2011-06-01 ENCOUNTER — Ambulatory Visit (INDEPENDENT_AMBULATORY_CARE_PROVIDER_SITE_OTHER): Payer: Medicare Other | Admitting: Family Medicine

## 2011-06-01 ENCOUNTER — Encounter: Payer: Self-pay | Admitting: Family Medicine

## 2011-06-01 DIAGNOSIS — E039 Hypothyroidism, unspecified: Secondary | ICD-10-CM

## 2011-06-01 DIAGNOSIS — Z0000 Encounter for general adult medical examination without abnormal findings: Secondary | ICD-10-CM

## 2011-06-01 DIAGNOSIS — E871 Hypo-osmolality and hyponatremia: Secondary | ICD-10-CM

## 2011-06-01 DIAGNOSIS — I10 Essential (primary) hypertension: Secondary | ICD-10-CM

## 2011-06-01 DIAGNOSIS — E7800 Pure hypercholesterolemia, unspecified: Secondary | ICD-10-CM

## 2011-06-01 DIAGNOSIS — I6529 Occlusion and stenosis of unspecified carotid artery: Secondary | ICD-10-CM

## 2011-06-01 NOTE — Patient Instructions (Addendum)
Follow BP at home.. Call if greater than 140/90. Get back on track with exercise to lower cholesterol. Keep up good work with diet and weight loss.

## 2011-06-01 NOTE — Assessment & Plan Note (Signed)
Worsened control.. Will try to get back to exercise as able. Recheck prior to next appt.

## 2011-06-01 NOTE — Assessment & Plan Note (Signed)
Borderline control.. Follow at home, cal if above goal.

## 2011-06-01 NOTE — Progress Notes (Signed)
Subjective:    Patient ID: [name] [name], female    DOB: 1941-06-20, 70 y.o.   MRN: [id number]  HPI  Hypertension:  Borderline control.  Using medication without problems or lightheadedness:  Chest pain with exertion:None Edema:None Short of breath:None Average home BPs: Has not been checking at home. Other issues:  Elevated Cholesterol:Red yeast rice.. Worsen control, no wabove goal <70. Using medications without problems: Muscle aches: None Diet compliance: Good weight watchers, has lost 20 lbs in last year. Exercise:[employer], weekly... Increasing as able Other complaints: right carotid artery stenosis  Thyroid Nodules:  Muliptle thyroid nodules, followed by ENDO.  BX showed no malignancy.Plan 6 month follow up US and TSH. Lab Results  Component Value Date   TSH 1.00 05/23/2011    Low back pain: Going to PT.  Seeing Dr. Dutch Quint, neurosurgery. MRI recently: IMPRESSION:  1. Moderate lumbar scoliosis.  2. Moderate bilateral facet arthritis at L5-S1.  3. Mild facet arthritis at L3-4 and L4-5 bilaterally.  4. Small broad-based disc protrusion at L3-4 without significant  impingement.   No surgical indications at this time. No pain medication at this time. Continues to use valium regularly at bedtime. Sleeping better at night.  Using back brace as needed. No falls, no sedation with this med.   Review of Systems  Constitutional: Negative for fever and fatigue.  HENT: Negative for ear pain.   Eyes: Negative for pain.  Respiratory: Negative for chest tightness and shortness of breath.   Cardiovascular: Negative for chest pain, palpitations and leg swelling.  Gastrointestinal: Negative for abdominal pain.  Genitourinary: Negative for dysuria.  Musculoskeletal: Negative for back pain.       Objective:   Physical Exam  Constitutional: Vital signs are normal. She appears well-developed and well-nourished. She is cooperative.  Non-toxic appearance. She does not appear ill. No  distress.  HENT:  Head: Normocephalic.  Right Ear: Hearing, tympanic membrane, external ear and ear canal normal.  Left Ear: Hearing, tympanic membrane, external ear and ear canal normal.  Nose: Nose normal.  Eyes: Conjunctivae, EOM and lids are normal. Pupils are equal, round, and reactive to light. No foreign bodies found.  Neck: Trachea normal and normal range of motion. Neck supple. Carotid bruit is not present. No mass and no thyromegaly present.  Cardiovascular: Normal rate, regular rhythm, S1 normal, S2 normal, normal heart sounds and intact distal pulses.  Exam reveals no gallop.   No murmur heard. Pulmonary/Chest: Effort normal and breath sounds normal. No respiratory distress. She has no wheezes. She has no rhonchi. She has no rales.  Abdominal: Soft. Normal appearance and bowel sounds are normal. She exhibits no distension, no fluid wave, no abdominal bruit and no mass. There is no hepatosplenomegaly. There is no tenderness. There is no rebound, no guarding and no CVA tenderness. No hernia.  Lymphadenopathy:    She has no cervical adenopathy.    She has no axillary adenopathy.  Neurological: She is alert. She has normal strength. No cranial nerve deficit or sensory deficit.  Skin: Skin is warm, dry and intact. No rash noted.  Psychiatric: Her speech is normal and behavior is normal. Judgment normal. Her mood appears not anxious. Cognition and memory are normal. She does not exhibit a depressed mood.          Assessment & Plan:  AMW: The patient's preventative maintenance and recommended screening tests for an annual wellness exam were reviewed in full today. Brought up to date unless services declined.  Counselled on the  importance of diet, exercise, and its role in overall health and mortality. The patient's FH and SH was reviewed, including their home life, tobacco status, and drug and alcohol status.   Up to date with shingles, Td, PNA.  Flu:Not interested in it  today. DEXA: osteopenia 06/2009, due.. Not interested. Mammogram: scheduled by GYN. Colon: Dr. Leone Payor.. Due this year for 5 years.. Mother with colon cancer. DVE/PAP: no pap indicated, no risk factors for ovarian/uterine cancer.. Dr. Tiburcio Pea GYN...plans 08/2011

## 2011-06-01 NOTE — Assessment & Plan Note (Signed)
Resolved

## 2011-06-01 NOTE — Assessment & Plan Note (Signed)
Per ENDO..follow [place] and TSH in 09/2011

## 2011-06-05 ENCOUNTER — Encounter: Payer: Medicare Other | Admitting: Orthopedic Surgery

## 2011-06-08 ENCOUNTER — Ambulatory Visit (AMBULATORY_SURGERY_CENTER): Payer: Medicare Other | Admitting: *Deleted

## 2011-06-08 VITALS — Ht 64.0 in | Wt 156.8 lb

## 2011-06-08 DIAGNOSIS — Z1211 Encounter for screening for malignant neoplasm of colon: Secondary | ICD-10-CM

## 2011-06-08 MED ORDER — PEG-KCL-NACL-NASULF-NA ASC-C 100 G PO SOLR: ORAL | Status: DC

## 2011-06-22 ENCOUNTER — Ambulatory Visit (AMBULATORY_SURGERY_CENTER): Payer: Medicare Other | Admitting: Internal Medicine

## 2011-06-22 ENCOUNTER — Encounter: Payer: Self-pay | Admitting: Internal Medicine

## 2011-06-22 VITALS — BP 151/70 | HR 91 | Temp 98.2°F | Resp 21 | Ht 64.0 in | Wt 156.0 lb

## 2011-06-22 DIAGNOSIS — Z86010 Personal history of colonic polyps: Secondary | ICD-10-CM

## 2011-06-22 DIAGNOSIS — K5730 Diverticulosis of large intestine without perforation or abscess without bleeding: Secondary | ICD-10-CM

## 2011-06-22 DIAGNOSIS — Z1211 Encounter for screening for malignant neoplasm of colon: Secondary | ICD-10-CM

## 2011-06-22 HISTORY — PX: COLONOSCOPY: SHX174

## 2011-06-22 MED ORDER — SODIUM CHLORIDE 0.9 % IV SOLN: 500.0000 mL | INTRAVENOUS | Status: DC

## 2011-06-22 NOTE — Patient Instructions (Addendum)
There were no polyps (again) today. You still have diverticulosis. Given your overall history - I recommend next routine repeat colonoscopy in 10 years (maybe) if it makes sense at 36. Iva Boop, MD, [facility] Discharge instructions given with verbal understanding. Handouts on diverticulosis and a high fiber diet given. Resume previous medications.

## 2011-06-23 ENCOUNTER — Telehealth: Payer: Self-pay | Admitting: *Deleted

## 2011-06-23 ENCOUNTER — Other Ambulatory Visit: Payer: Self-pay | Admitting: *Deleted

## 2011-06-23 MED ORDER — CELECOXIB 200 MG PO CAPS: 200.0000 mg | ORAL_CAPSULE | Freq: Two times a day (BID) | ORAL | Status: DC

## 2011-06-23 NOTE — Telephone Encounter (Signed)

## 2011-06-23 NOTE — Telephone Encounter (Deleted)
Follow up Call- Patient questions:  Do you have a fever, pain , or abdominal swelling? no Pain Score  0 *  Have you tolerated food without any problems? yes  Have you been able to return to your normal activities? yes  Do you have any questions about your discharge instructions: Diet   no Medications  no Follow up visit  no  Do you have questions or concerns about your Care? no  Actions: * If pain score is 4 or above: {ACTION; [facility] ENDO PAIN >4:21563::"No action needed, pain <4."}   

## 2011-07-17 ENCOUNTER — Other Ambulatory Visit: Payer: Self-pay | Admitting: Family Medicine

## 2011-07-17 NOTE — Telephone Encounter (Signed)
rx called to pharmacy 

## 2011-07-17 NOTE — Telephone Encounter (Signed)
OK to refill

## 2011-08-01 ENCOUNTER — Encounter: Payer: Medicare Other | Admitting: *Deleted

## 2011-08-02 ENCOUNTER — Other Ambulatory Visit: Payer: Self-pay | Admitting: *Deleted

## 2011-08-02 NOTE — Telephone Encounter (Signed)
1)Pt calling for refill of Protonix, she says it was refilled incorrectly last time she takes 2 tabs daily instead of 1. Needs 90 day rx sent to [facility] pharmacy.Marland Kitchen  2)She is scheduled to have a carotid artery u/s in 2 weeks, but she wants to know if you feel it is ok for her to reschedule that to the 1st week of Jan. She has a family member that has health issues and she may have to go to them in Dec.  Pt is aware Dr Ermalene Searing is out of office and that she may not get a response until tomorrow afternoon.

## 2011-08-03 MED ORDER — PANTOPRAZOLE SODIUM 40 MG PO TBEC: 40.0000 mg | DELAYED_RELEASE_TABLET | Freq: Two times a day (BID) | ORAL | Status: DC

## 2011-08-03 NOTE — Telephone Encounter (Signed)
Okay to post pone carotid dopplers as routine follow up.

## 2011-08-04 NOTE — Telephone Encounter (Signed)
Patient advised.

## 2011-08-08 ENCOUNTER — Ambulatory Visit (INDEPENDENT_AMBULATORY_CARE_PROVIDER_SITE_OTHER): Payer: Medicare Other | Admitting: Internal Medicine

## 2011-08-08 ENCOUNTER — Encounter: Payer: Self-pay | Admitting: Internal Medicine

## 2011-08-08 VITALS — BP 146/76 | HR 72 | Ht 64.0 in | Wt 157.2 lb

## 2011-08-08 DIAGNOSIS — G4733 Obstructive sleep apnea (adult) (pediatric): Secondary | ICD-10-CM

## 2011-08-08 DIAGNOSIS — Z9989 Dependence on other enabling machines and devices: Secondary | ICD-10-CM

## 2011-08-08 NOTE — Patient Instructions (Signed)
Order- [facility] Reduce CPAP from 9 to 8 cwp   Dx OSA  Please call as needed

## 2011-08-08 NOTE — Progress Notes (Signed)
08/08/11-70 year old female never smoker followed for obstructive sleep apnea, allergic rhinitis LOV-08/09/10 She plans to get flu shot today at the drugstore. Continues daily use of CPAP 9/[facility] all night every night. Has lost 20 pounds with Weight Watchers and would like to have her pressure reduced to 8. Being treated medically for her degenerative disc disease and back pain without surgery planned. Nasal congestion has not been a problem.   ROS-see HPI Constitutional:   No-   weight loss, night sweats, fevers, chills, fatigue, lassitude. HEENT:   No-  headaches, difficulty swallowing, tooth/dental problems, sore throat,       No-  sneezing, itching, ear ache, nasal congestion, post nasal drip,  CV:  No-   chest pain, orthopnea, PND, swelling in lower extremities, anasarca,                                  dizziness, palpitations Resp: No-   shortness of breath with exertion or at rest.              No-   productive cough,  No non-productive cough,  No- coughing up of blood.              No-   change in color of mucus.  No- wheezing.   Skin: No-   rash or lesions. GI:  No-   heartburn, indigestion, abdominal pain, nausea, vomiting, diarrhea,                 change in bowel habits, loss of appetite GU: No-   dysuria, change in color of urine, no urgency or frequency.  No- flank pain. MS:  No-   joint pain or swelling.  No- decreased range of motion.  No- back pain. Neuro-     nothing unusual Psych:  No- change in mood or affect. No depression or anxiety.  No memory loss.   OBJ General- Alert, Oriented, Affect-appropriate, Distress- none acute; looks well Skin- rash-none, lesions- none, excoriation- none Lymphadenopathy- none Head- atraumatic            Eyes- Gross vision intact, PERRLA, conjunctivae clear secretions            Ears- Hearing, canals-normal            Nose- Clear, no-Septal dev, mucus, polyps, erosion, perforation             Throat- Mallampati II-III , mucosa clear ,  drainage- none, tonsils- atrophic Neck- flexible , trachea midline, no stridor , thyroid nl, carotid no bruit Chest - symmetrical excursion , unlabored           Heart/CV- RRR , no murmur , no gallop  , no rub, nl s1 s2                           - JVD- none , edema- none, stasis changes- none, varices- none           Lung- clear to P&A, wheeze- none, cough- none , dullness-none, rub- none           Chest wall-  Abd- tender-no, distended-no, bowel sounds-present, HSM- no Br/ Gen/ Rectal- Not done, not indicated Extrem- cyanosis- none, clubbing, none, atrophy- none, strength- nl Neuro- grossly intact to observation

## 2011-08-11 NOTE — Assessment & Plan Note (Signed)
Compliance and control with CPAP been very good. She has lost significant weight at her request to try a lower pressure is appropriate. Plan-reduced CPAP to 8.

## 2011-08-15 ENCOUNTER — Encounter: Payer: Medicare Other | Admitting: *Deleted

## 2011-09-06 ENCOUNTER — Telehealth: Payer: Self-pay | Admitting: Internal Medicine

## 2011-09-06 DIAGNOSIS — G4733 Obstructive sleep apnea (adult) (pediatric): Secondary | ICD-10-CM

## 2011-09-06 NOTE — Telephone Encounter (Signed)
Ok to order  - [facility]  Change CPAP to 9 cwp  Dx OSA

## 2011-09-06 NOTE — Telephone Encounter (Signed)
Please advise if okay to send order to change pressure from 10 to 9 thanks!

## 2011-09-06 NOTE — Telephone Encounter (Signed)
lmomtcb x1--order sent 

## 2011-09-07 NOTE — Telephone Encounter (Signed)
lmom[facility]cb  

## 2011-09-08 NOTE — Telephone Encounter (Signed)
Order sent to [facility]. Carron Curie, CMA

## 2011-09-11 ENCOUNTER — Other Ambulatory Visit: Payer: Self-pay | Admitting: Family Medicine

## 2011-09-12 ENCOUNTER — Other Ambulatory Visit: Payer: Self-pay | Admitting: Cardiology

## 2011-09-12 ENCOUNTER — Encounter (INDEPENDENT_AMBULATORY_CARE_PROVIDER_SITE_OTHER): Payer: Medicare Other | Admitting: *Deleted

## 2011-09-12 DIAGNOSIS — I6529 Occlusion and stenosis of unspecified carotid artery: Secondary | ICD-10-CM

## 2011-09-14 NOTE — Progress Notes (Signed)
Addended byKerby Nora E on: 09/14/2011 01:56 PM   Modules accepted: Orders

## 2011-09-18 ENCOUNTER — Telehealth: Payer: Self-pay | Admitting: Internal Medicine

## 2011-09-18 NOTE — Telephone Encounter (Incomplete)
Patient called and stated that she went in for her carotid artery and her blood pressure was 164/84 and they informed her to keep a log and she did and called back today and stated on the 9th in the morning it was 134/70 but that afternoon it was 161/84 and on the 10th it was 134/73 in the morning but 173/74 that afternoon but since then it's been running around 139/70 and today it was 129/73.  She just wanted to know if she should come in and be seen or what your suggestions were?

## 2011-09-19 ENCOUNTER — Ambulatory Visit: Payer: Medicare Other | Admitting: Family Medicine

## 2011-09-19 NOTE — Telephone Encounter (Signed)
Since it seems to have returned to normal.. Continue to follow over the next week. If further measurements >140/90 call for BP med adjustment.  Also verify no SOB, no cP.

## 2011-09-19 NOTE — Telephone Encounter (Signed)
Patient advised their are is no SOB or chest pain

## 2011-10-03 ENCOUNTER — Ambulatory Visit: Payer: Medicare Other | Admitting: Family Medicine

## 2011-10-06 ENCOUNTER — Telehealth: Payer: Self-pay | Admitting: Family Medicine

## 2011-10-06 MED ORDER — CELECOXIB 200 MG PO CAPS: 200.0000 mg | ORAL_CAPSULE | Freq: Two times a day (BID) | ORAL | Status: DC

## 2011-10-06 NOTE — Telephone Encounter (Signed)
Refill for Celebrex cap 200 mg take one capsule twice daily called into [facility].  Prescription has ran out.  Thanks

## 2011-11-15 ENCOUNTER — Other Ambulatory Visit: Payer: Self-pay | Admitting: Family Medicine

## 2011-11-16 NOTE — Telephone Encounter (Signed)
rx called to pharmacy 

## 2011-12-05 ENCOUNTER — Telehealth: Payer: Self-pay | Admitting: Internal Medicine

## 2011-12-05 DIAGNOSIS — G4733 Obstructive sleep apnea (adult) (pediatric): Secondary | ICD-10-CM

## 2011-12-05 NOTE — Telephone Encounter (Signed)
Order sent to [facility] Spoke with pt and notified that this has been done.   Pt verbalized understanding and states nothing further needed.

## 2011-12-05 NOTE — Telephone Encounter (Signed)
I spoke with pt and she states she is wanting to have another download down on her cpap machine. She states she does not believe the last one was correct bc she was having problems with getting the download off her machine. She states she would feel more comfortable if another one is done. Please advise Dr. Maple Hudson, thanks  [facility]

## 2011-12-05 NOTE — Telephone Encounter (Signed)
Per [facility] Order DME home care download repeat CPAP for pressure check in 7 days

## 2011-12-18 ENCOUNTER — Telehealth: Payer: Self-pay | Admitting: Internal Medicine

## 2011-12-18 DIAGNOSIS — G4733 Obstructive sleep apnea (adult) (pediatric): Secondary | ICD-10-CM

## 2011-12-18 NOTE — Telephone Encounter (Signed)
Suggest order DME- autotitration CPAP   For pressure recommendation  5-15 cwp x 1 week    Dx OSA

## 2011-12-18 NOTE — Telephone Encounter (Signed)
Pt aware that we have placed order for [facility] to do auto titration.

## 2011-12-18 NOTE — Telephone Encounter (Signed)
I spoke with the pt and she states her husband has told her she is pausing in her breathing during the night again. She states she has also been having increased daytime fatigue as well as hoarseness. She states she just feels like something is going on with cpap. She states she is not having any issues with mask and she feels the pressure is fine as well.   She states her last sleep study was 10 yrs ago and wonders if this needs to be done again.  Her current setting is on 8. Please advise. Carron Curie, CMA

## 2011-12-21 ENCOUNTER — Encounter: Payer: Self-pay | Admitting: Family Medicine

## 2011-12-21 ENCOUNTER — Ambulatory Visit (INDEPENDENT_AMBULATORY_CARE_PROVIDER_SITE_OTHER): Payer: Medicare Other | Admitting: Family Medicine

## 2011-12-21 VITALS — BP 146/90 | HR 72 | Temp 97.9°F | Wt 156.2 lb

## 2011-12-21 DIAGNOSIS — R1032 Left lower quadrant pain: Secondary | ICD-10-CM | POA: Insufficient documentation

## 2011-12-21 DIAGNOSIS — R300 Dysuria: Secondary | ICD-10-CM

## 2011-12-21 LAB — POCT URINALYSIS DIPSTICK
Bilirubin, UA: NEGATIVE
Blood, UA: NEGATIVE
Ketones, UA: NEGATIVE
Protein, UA: NEGATIVE
Spec Grav, UA: 1.01
pH, UA: 7.5

## 2011-12-21 LAB — COMPREHENSIVE METABOLIC PANEL
Albumin: 5.2 g/dL (ref 3.5–5.2)
Alkaline Phosphatase: 91 U/L (ref 39–117)
BUN: 21 mg/dL (ref 6–23)
CO2: 23 mEq/L (ref 19–32)
Calcium: 9.6 mg/dL (ref 8.4–10.5)
Chloride: 99 mEq/L (ref 96–112)
GFR: 85.93 mL/min (ref >60.00)
Glucose, Bld: 88 mg/dL (ref 70–99)
Potassium: 3.8 mEq/L (ref 3.5–5.1)
Sodium: 135 mEq/L (ref 135–145)
Total Protein: 8.5 g/dL — ABNORMAL HIGH (ref 6.0–8.3)

## 2011-12-21 LAB — CBC WITH DIFFERENTIAL/PLATELET
Basophils Relative: 0.9 % (ref 0.0–3.0)
Eosinophils Absolute: 0.1 10*3/uL (ref 0.0–0.7)
Eosinophils Relative: 1.6 % (ref 0.0–5.0)
Lymphocytes Relative: 41 % (ref 12.0–46.0)
MCV: 90.8 fl (ref 78.0–100.0)
Monocytes Absolute: 0.4 10*3/uL (ref 0.1–1.0)
Neutrophils Relative %: 50.5 % (ref 43.0–77.0)
Platelets: 267 10*3/uL (ref 150.0–400.0)
RBC: 5.02 Mil/uL (ref 3.87–5.11)
WBC: 6.5 10*3/uL (ref 4.5–10.5)

## 2011-12-21 MED ORDER — CIPROFLOXACIN HCL 500 MG PO TABS: 500.0000 mg | ORAL_TABLET | Freq: Two times a day (BID) | ORAL | Status: AC

## 2011-12-21 NOTE — Progress Notes (Signed)
  Subjective:    Patient ID: [name] [name], female    DOB: 07/26/1941, 71 y.o.   MRN: [id number]  HPI CC: abd pain  Pleasant 71 yo pt of Dr. Cleotis Nipper with h/o diverticulitis, LBP, OSA, PUD, GERD with HH, HLD and HTN and hypothyroid presents with 1 wk h/o pain with voiding possibly bladder spasm.  Mild dyusria and having noticeable throbbing LLQ pain that started several weeks ago and is worse with sitting.  Staying more fatigued.  No fevers/chills, flank pain, urgency, polyuria, hematuria.  No BM changed, blood in stool, diarrhea, constipation, n/v.  Last BM was this morning, normal.    Has cut back on peanuts this week to see if would help.  Has helped some.  Had been using more splenda, wonders if bladder pain due to this.  Last colonoscopy was 06/2011 - moderate diverticulosis L sided, otherwise normal.  Recent well woman, normal exam.  Never any problem with ovaries.  h/o cholecystectomy h/o diverticulitis Sister with ho stomach cancer. Brother with carcinoma in abdomen died last year. Sister with brain tumor. mother with h/o colon cancer.  Wt Readings from Last 3 Encounters:  12/21/11 156 lb 4 oz (70.875 kg)  08/08/11 157 lb 3.2 oz (71.305 kg)  06/22/11 156 lb (70.761 kg)    Past Medical History  Diagnosis Date  . Hemorrhoids, internal, with bleeding   . Diverticulitis   . Vertigo   . Chronic lower back pain   . Scoliosis   . Vaginitis, atrophic   . OSA (obstructive sleep apnea)   . PUD (peptic ulcer disease)   . Arthritis   . GERD (gastroesophageal reflux disease)   . Hiatal hernia   . HLD (hyperlipidemia)   . Hypertension   . History of colon polyps   . Thyroid disease     hypo    Review of Systems Per HPI    Objective:   Physical Exam  Nursing note and vitals reviewed. Constitutional: She appears well-developed and well-nourished. No distress.  HENT:  Head: Normocephalic and atraumatic.  Mouth/Throat: Oropharynx is clear and moist. No oropharyngeal  exudate.  Eyes: Conjunctivae and EOM are normal. Pupils are equal, round, and reactive to light.  Neck: Normal range of motion. Neck supple.  Cardiovascular: Normal rate, regular rhythm, normal heart sounds and intact distal pulses.   No murmur heard. Pulmonary/Chest: Effort normal and breath sounds normal. No respiratory distress. She has no wheezes. She has no rales.  Abdominal: Soft. Bowel sounds are normal. She exhibits no distension and no mass. There is no hepatosplenomegaly. There is tenderness (mod to palpation) in the left lower quadrant. There is no rigidity, no rebound, no guarding, no CVA tenderness and negative Murphy's sign. No hernia. Hernia confirmed negative in the right inguinal area and confirmed negative in the left inguinal area.  Musculoskeletal: She exhibits no edema.  Lymphadenopathy:    She has no cervical adenopathy.  Skin: Skin is warm and dry. No rash noted.  Psychiatric: She has a normal mood and affect.       Assessment & Plan:

## 2011-12-21 NOTE — Assessment & Plan Note (Addendum)
H/o diverticulosis with LLQ pain, however no fever/chill, BM changes. Check blood work. Anticipate mild diverticulitis flare.  Treat with cipro x 10 days. rec clear liq diet for next 1-2 days then low fiber diet. Strong fmhx cancer-  Discussed importance of f/u if not improving as expected for further evaluation.  Sent Ucx for dysuria.

## 2011-12-21 NOTE — Patient Instructions (Signed)
Urine culture sent.  Blood work today. Treat with cipro twice daily for 10 days to cover possible mild diverticulitis flare. Clear liquid diet for next 1-2 days then low fiber diet until feeling better. Please let us know if not improving as expected for further evaluation. I hope you start feeling better!

## 2012-01-08 ENCOUNTER — Telehealth: Payer: Self-pay | Admitting: Internal Medicine

## 2012-01-08 DIAGNOSIS — G4733 Obstructive sleep apnea (adult) (pediatric): Secondary | ICD-10-CM

## 2012-01-08 NOTE — Telephone Encounter (Signed)
Download reviewed- order DME [facility] change CPAP to 12 cwp   Dx OSA

## 2012-01-08 NOTE — Telephone Encounter (Signed)
Pt is calling for cpap download results from [facility]. Pls advise.

## 2012-01-08 NOTE — Telephone Encounter (Signed)
Order placed. Pt aware of new setting request sent to [facility]. Pt requests to know what the percentage was that had her pressure increased from 8 to 12. Please advise. Also, patient states she was told that she has numerous nodules on her thyroid-too many to count; she would like to know if this can have anything to do with her trouble with CPAP and sleep. Biopsy was done and no serious findings-will repeat in about 1 year per her MD.    Pt is aware if she has any troubles with pressure to keep in touch with [place] and we can adjust accordingly.

## 2012-01-08 NOTE — Telephone Encounter (Signed)
Download received and placed on [facility] cart.

## 2012-01-08 NOTE — Telephone Encounter (Signed)
I called [facility] and requested titration study be faxed to triage fax. I will await fax. Carron Curie, CMA

## 2012-01-08 NOTE — Telephone Encounter (Signed)
Per [facility] 8CWP; we dont see recent download; please ask [facility] to send report.

## 2012-01-10 ENCOUNTER — Telehealth: Payer: Self-pay | Admitting: Internal Medicine

## 2012-01-10 DIAGNOSIS — G4733 Obstructive sleep apnea (adult) (pediatric): Secondary | ICD-10-CM

## 2012-01-10 NOTE — Telephone Encounter (Signed)
PT reports that last night was her first night on 12 CPAP setting and she could not tolerate this.  She woke up after 5 hrs with at feeling of gasping,too much air in stomach and pressure in neck.  Pt wants to possibly decrease her setting.  Jill Alexanders form [facility] also recommended pt getting a newer machine but she has only had hers since 2010 and thinks that medicare will only pay for new machine every 5 yrs.  Please advise.

## 2012-01-10 NOTE — Telephone Encounter (Signed)
Order- [facility]> DME reduce CPAP to 11 cwp  She won't qualify for new machine, but encourage her to let us know so we can adjust till she is comfortable.

## 2012-01-10 NOTE — Telephone Encounter (Signed)
PT informed order sent to [facility] to have CPAP decreased to 11.

## 2012-01-10 NOTE — Telephone Encounter (Signed)
If thyroid hormone levels are ok and her airway is not crowded by a big, bulky goiter, then I don't think the thyroid nodules will affect her sleep.

## 2012-01-10 NOTE — Telephone Encounter (Signed)
Pt requests to know what the percentage was that had her pressure increased from 8 to 12

## 2012-01-11 ENCOUNTER — Encounter: Payer: Self-pay | Admitting: Internal Medicine

## 2012-01-14 ENCOUNTER — Other Ambulatory Visit: Payer: Self-pay | Admitting: Family Medicine

## 2012-01-15 NOTE — Telephone Encounter (Signed)
Pt states she was trapping air in her throat and that [facility] was contacted about the pressure and agreed to cut pressure down to 11 cwp. Pt is aware as to why CY choose to put patient on 12 cwp.

## 2012-01-15 NOTE — Telephone Encounter (Signed)
[name] [name] has this been completed? Please advise.Carron Curie, CMA

## 2012-01-15 NOTE — Telephone Encounter (Signed)
Patient rx called to pharmacy [facility]

## 2012-01-15 NOTE — Telephone Encounter (Signed)
The download indicated that her best pressure was actually 12.5 cwp, which gave an AHI score of 3.1/ hr. The normal AHI is between 0-5, so hers is right in the target range when CPAP is about 12. That's why the recommendation was to try 12 cwp.

## 2012-01-15 NOTE — Telephone Encounter (Signed)
Pt called as a reminder that pt is allergic to generic Valium and does want namebrand Valium 2 mg sent to [facility]. Pt is not out of med and pt will ck with [facility] on 01/16/12 for refill. Thank you.

## 2012-01-22 ENCOUNTER — Telehealth: Payer: Self-pay | Admitting: Family Medicine

## 2012-01-22 DIAGNOSIS — E7800 Pure hypercholesterolemia, unspecified: Secondary | ICD-10-CM

## 2012-01-22 DIAGNOSIS — E871 Hypo-osmolality and hyponatremia: Secondary | ICD-10-CM

## 2012-01-22 DIAGNOSIS — I10 Essential (primary) hypertension: Secondary | ICD-10-CM

## 2012-01-22 NOTE — Telephone Encounter (Signed)
Message copied by Excell Seltzer on Mon Jan 22, 2012 12:16 PM ------      Message from: Baldomero Lamy      Created: Thu Jan 18, 2012  1:03 PM      Regarding: 6 mo f/u labs Tues 5/21       Please order  future f/u labs for pt's upcomming lab appt.      Thanks      Rodney Booze

## 2012-01-23 ENCOUNTER — Other Ambulatory Visit (INDEPENDENT_AMBULATORY_CARE_PROVIDER_SITE_OTHER): Payer: Medicare Other

## 2012-01-23 DIAGNOSIS — I10 Essential (primary) hypertension: Secondary | ICD-10-CM

## 2012-01-23 DIAGNOSIS — E7800 Pure hypercholesterolemia, unspecified: Secondary | ICD-10-CM

## 2012-01-23 DIAGNOSIS — E871 Hypo-osmolality and hyponatremia: Secondary | ICD-10-CM

## 2012-01-23 LAB — COMPREHENSIVE METABOLIC PANEL
ALT: 24 U/L (ref 0–35)
Albumin: 4.4 g/dL (ref 3.5–5.2)
CO2: 27 mEq/L (ref 19–32)
Calcium: 9.4 mg/dL (ref 8.4–10.5)
Chloride: 105 mEq/L (ref 96–112)
GFR: 78.23 mL/min (ref >60.00)
Sodium: 141 mEq/L (ref 135–145)
Total Protein: 7.4 g/dL (ref 6.0–8.3)

## 2012-01-23 LAB — LIPID PANEL: Total CHOL/HDL Ratio: 2

## 2012-01-30 ENCOUNTER — Encounter: Payer: Self-pay | Admitting: Family Medicine

## 2012-01-30 ENCOUNTER — Ambulatory Visit (INDEPENDENT_AMBULATORY_CARE_PROVIDER_SITE_OTHER): Payer: Medicare Other | Admitting: Family Medicine

## 2012-01-30 VITALS — BP 130/80 | HR 88 | Temp 97.8°F | Ht 64.0 in | Wt 160.4 lb

## 2012-01-30 DIAGNOSIS — E7800 Pure hypercholesterolemia, unspecified: Secondary | ICD-10-CM

## 2012-01-30 DIAGNOSIS — I10 Essential (primary) hypertension: Secondary | ICD-10-CM

## 2012-01-30 DIAGNOSIS — E042 Nontoxic multinodular goiter: Secondary | ICD-10-CM

## 2012-01-30 NOTE — Progress Notes (Signed)
  Subjective:    Patient ID: [name] [name], female    DOB: [birth year], 71 y.o.   MRN: [id number]  HPI Hypertension: Well controlled on amlodipine. Using medication without problems or lightheadedness:  Chest pain with exertion:None  Edema:intermittant.. After chinese food, MSG Short of breath:None  Average home BPs: 125-127/70-80  Other issues:   Elevated Cholesterol: Improved control , almost at goal <70 Lab Results  Component Value Date   CHOL 173 01/23/2012   HDL 71.70 01/23/2012   LDLCALC 89 01/23/2012   LDLDIRECT 111.9 05/27/2010   TRIG 64.0 01/23/2012   CHOLHDL 2 01/23/2012   Using medications without problems: None Muscle aches: None  Diet compliance: Good weight watchers Exercise: Has stopped now, plans to restart [employer]. Other complaints: right carotid artery stenosis   Back pain resolved.  Thyroid Nodules: Muliptle thyroid nodules, followed by ENDO ( dr. Tedd Sias). BX showed no malignancy. 6 month [place] and TSH stable... Follow up planned in 1 year.  Sleep apnea: working on getting different adjusting CPAP.  Resolved dicerticulitis from 12/2011     Review of Systems  Constitutional: Negative for fever and fatigue.  HENT: Negative for ear pain.   Eyes: Negative for pain.  Respiratory: Negative for chest tightness and shortness of breath.   Cardiovascular: Negative for chest pain, palpitations and leg swelling.  Gastrointestinal: Negative for abdominal pain.  Genitourinary: Negative for dysuria.       Objective:   Physical Exam  Constitutional: Vital signs are normal. She appears well-developed and well-nourished. She is cooperative.  Non-toxic appearance. She does not appear ill. No distress.  HENT:  Head: Normocephalic.  Right Ear: Hearing, tympanic membrane, external ear and ear canal normal. Tympanic membrane is not erythematous, not retracted and not bulging.  Left Ear: Hearing, tympanic membrane, external ear and ear canal normal. Tympanic membrane is not  erythematous, not retracted and not bulging.  Nose: No mucosal edema or rhinorrhea. Right sinus exhibits no maxillary sinus tenderness and no frontal sinus tenderness. Left sinus exhibits no maxillary sinus tenderness and no frontal sinus tenderness.  Mouth/Throat: Uvula is midline, oropharynx is clear and moist and mucous membranes are normal.  Eyes: Conjunctivae, EOM and lids are normal. Pupils are equal, round, and reactive to light. No foreign bodies found.  Neck: Trachea normal and normal range of motion. Neck supple. Carotid bruit is not present. No mass and no thyromegaly present.  Cardiovascular: Normal rate, regular rhythm, S1 normal, S2 normal, normal heart sounds, intact distal pulses and normal pulses.  Exam reveals no gallop and no friction rub.   No murmur heard. Pulmonary/Chest: Effort normal and breath sounds normal. Not tachypneic. No respiratory distress. She has no decreased breath sounds. She has no wheezes. She has no rhonchi. She has no rales.  Abdominal: Soft. Normal appearance and bowel sounds are normal. There is no tenderness.  Neurological: She is alert.  Skin: Skin is warm, dry and intact. No rash noted.  Psychiatric: Her speech is normal and behavior is normal. Judgment and thought content normal. Her mood appears not anxious. Cognition and memory are normal. She does not exhibit a depressed mood.          Assessment & Plan:

## 2012-01-30 NOTE — Patient Instructions (Addendum)
Work on diet changes for low cholesterol diet. Get back on track with exercise. Next appt: CPX in 6 months with fasting labs prior.

## 2012-02-08 ENCOUNTER — Telehealth: Payer: Self-pay | Admitting: Internal Medicine

## 2012-02-08 DIAGNOSIS — G4733 Obstructive sleep apnea (adult) (pediatric): Secondary | ICD-10-CM

## 2012-02-08 NOTE — Telephone Encounter (Signed)
Pt would like to know if it is possible to get a lower setting for her CPAP.  Also, pt would like to know what to do if this happens in the future.  Pt stated that she took GasX, walked & rubbed her stomach.  Is there anything else she could've done.  Pt stated the pain was awful.  Pt stated that she has talked w/ [facility] about getting a new CPAP that will allow automatic adjustments.  Unsure if this will help or not.  Antionette Fairy

## 2012-02-08 NOTE — Telephone Encounter (Signed)
I spoke with pt and she states since being started on this new high cpap pressure she has had 2 episode of air getting trapped in her throat and in her stomach. This morning when the air was trapped in her stomach it caused her to have sever pain--she took gas x and rubbed her stomach but it didn't help. Pt is wanting to know what to do if this happens again. Also wants her cpap pressure lowered. She spoke with [facility] and they are going to try to find her a cpap machine that has auto setting and trade her current machine out since she is not eligible for a new machine. Please advise Dr. Maple Hudson thanks

## 2012-02-08 NOTE — Telephone Encounter (Signed)
Order- DME [facility]- reduce CPAP to 10 cwp

## 2012-02-08 NOTE — Telephone Encounter (Signed)
Spoke with pt and notified of recs per [facility]. Pt verbalized understanding and states nothing further needed. Order was sent to [facility].

## 2012-02-13 NOTE — Assessment & Plan Note (Signed)
Well controlled. Continue current medication.  

## 2012-02-13 NOTE — Assessment & Plan Note (Signed)
Stable

## 2012-02-13 NOTE — Assessment & Plan Note (Signed)
Work on diet changes for low cholesterol diet. Get back on track with exercise.

## 2012-02-19 ENCOUNTER — Other Ambulatory Visit: Payer: Self-pay | Admitting: *Deleted

## 2012-02-19 MED ORDER — AMLODIPINE BESYLATE 10 MG PO TABS: 10.0000 mg | ORAL_TABLET | Freq: Every day | ORAL | Status: DC

## 2012-03-05 ENCOUNTER — Other Ambulatory Visit: Payer: Self-pay | Admitting: Family Medicine

## 2012-03-05 NOTE — Telephone Encounter (Signed)
rx called to [facility] [place]

## 2012-03-14 ENCOUNTER — Encounter (INDEPENDENT_AMBULATORY_CARE_PROVIDER_SITE_OTHER): Payer: Medicare Other

## 2012-03-14 DIAGNOSIS — I6529 Occlusion and stenosis of unspecified carotid artery: Secondary | ICD-10-CM

## 2012-03-14 DIAGNOSIS — R42 Dizziness and giddiness: Secondary | ICD-10-CM

## 2012-03-20 ENCOUNTER — Encounter: Payer: Self-pay | Admitting: *Deleted

## 2012-03-20 ENCOUNTER — Encounter: Payer: Self-pay | Admitting: Family Medicine

## 2012-04-08 ENCOUNTER — Other Ambulatory Visit: Payer: Self-pay | Admitting: Family Medicine

## 2012-04-10 NOTE — Telephone Encounter (Signed)
rx called to pharmacy 

## 2012-05-09 ENCOUNTER — Other Ambulatory Visit: Payer: Self-pay | Admitting: Family Medicine

## 2012-05-10 NOTE — Telephone Encounter (Signed)
Rx called in 

## 2012-06-10 ENCOUNTER — Other Ambulatory Visit: Payer: Self-pay | Admitting: Family Medicine

## 2012-06-10 NOTE — Telephone Encounter (Signed)
Received refill request electronically. Last office visit 01/30/12. Is it okay to refill medication?

## 2012-06-11 NOTE — Telephone Encounter (Signed)
Rx called to pharmacy as instructed. 

## 2012-06-24 ENCOUNTER — Other Ambulatory Visit (INDEPENDENT_AMBULATORY_CARE_PROVIDER_SITE_OTHER): Payer: Medicare Other

## 2012-06-24 DIAGNOSIS — E7800 Pure hypercholesterolemia, unspecified: Secondary | ICD-10-CM

## 2012-06-24 DIAGNOSIS — I10 Essential (primary) hypertension: Secondary | ICD-10-CM

## 2012-06-24 LAB — CBC WITH DIFFERENTIAL/PLATELET
Basophils Relative: 0.8 % (ref 0.0–3.0)
Eosinophils Relative: 2.1 % (ref 0.0–5.0)
Hemoglobin: 15.1 g/dL — ABNORMAL HIGH (ref 12.0–15.0)
Lymphocytes Relative: 42.5 % (ref 12.0–46.0)
MCV: 91.2 fl (ref 78.0–100.0)
Neutrophils Relative %: 49 % (ref 43.0–77.0)
RBC: 4.93 Mil/uL (ref 3.87–5.11)
WBC: 6.2 10*3/uL (ref 4.5–10.5)

## 2012-06-24 LAB — COMPREHENSIVE METABOLIC PANEL
AST: 20 U/L (ref 0–37)
BUN: 20 mg/dL (ref 6–23)
Calcium: 9.6 mg/dL (ref 8.4–10.5)
Chloride: 102 mEq/L (ref 96–112)
Creatinine, Ser: 0.8 mg/dL (ref 0.4–1.2)
Total Bilirubin: 0.8 mg/dL (ref 0.3–1.2)

## 2012-06-24 LAB — LIPID PANEL
Cholesterol: 173 mg/dL (ref 0–200)
HDL: 69.4 mg/dL (ref >39.00)
LDL Cholesterol: 83 mg/dL (ref 0–99)
Triglycerides: 103 mg/dL (ref 0.0–149.0)
VLDL: 20.6 mg/dL (ref 0.0–40.0)

## 2012-06-28 ENCOUNTER — Ambulatory Visit (INDEPENDENT_AMBULATORY_CARE_PROVIDER_SITE_OTHER): Payer: Medicare Other | Admitting: Family Medicine

## 2012-06-28 ENCOUNTER — Encounter: Payer: Self-pay | Admitting: Family Medicine

## 2012-06-28 VITALS — BP 150/80 | HR 80 | Temp 97.5°F | Wt 161.0 lb

## 2012-06-28 DIAGNOSIS — R609 Edema, unspecified: Secondary | ICD-10-CM

## 2012-06-28 DIAGNOSIS — I10 Essential (primary) hypertension: Secondary | ICD-10-CM

## 2012-06-28 DIAGNOSIS — Z23 Encounter for immunization: Secondary | ICD-10-CM

## 2012-06-28 DIAGNOSIS — E7800 Pure hypercholesterolemia, unspecified: Secondary | ICD-10-CM

## 2012-06-28 NOTE — Progress Notes (Signed)
Subjective:    Patient ID: [name] [name], female    DOB: Apr 08, 1941, 71 y.o.   MRN: [id number]  HPI Came in fo follow up early.. Should have been medicare physical.  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  Now had noted few days after she noted blistered area then scally. She thought it was from the tape.  Has had increased stress lately.. Husband passed out in wreck.  Hypertension: elevated today on amlodipine.  Using medication without problems or lightheadedness:  None Chest pain with exertion:None  Edema:intermittant.. Significant increase in last few weeks.  Short of breath:None Average home BPs: 125-127/70-80  Other issues:  Has gained weight. Has started work ing diet better. Swelling improved some with this Labs nml.. nml cbc and TSH, nml liver, kidney, no new meds.  Elevated Cholesterol: Improved control , almost at goal <70  Lab Results  Component Value Date   CHOL 173 06/24/2012   HDL 69.40 06/24/2012   LDLCALC 83 06/24/2012   LDLDIRECT 111.9 05/27/2010   TRIG 103.0 06/24/2012   CHOLHDL 2 06/24/2012  Using medications without problems: None  Muscle aches: None  Diet compliance: Good weight watchers  Exercise: Has stopped now, plans to restart [employer].  Other complaints: right carotid artery stenosis   Back pain resolved.   Thyroid Nodules: Muliptle thyroid nodules, followed by ENDO ( dr. Tedd Sias). BX showed no malignancy. 6 month [place] and TSH stable... Follow up planned in 1 year.   Sleep apnea: working on getting different adjusting CPAP.   Resolved dicerticulitis from 12/2011       Review of Systems  Constitutional: Negative for fever and fatigue.  HENT: Negative for ear pain.   Eyes: Negative for pain.  Respiratory: Negative for chest tightness and shortness of breath.   Cardiovascular: Negative for chest pain, palpitations and leg swelling.  Gastrointestinal: Negative for abdominal pain.  Genitourinary: Negative for dysuria.       Objective:   Physical Exam  Constitutional: Vital signs are normal. She appears well-developed and well-nourished. She is cooperative.  Non-toxic appearance. She does not appear ill. No distress.  HENT:  Head: Normocephalic.  Right Ear: Hearing, tympanic membrane, external ear and ear canal normal. Tympanic membrane is not erythematous, not retracted and not bulging.  Left Ear: Hearing, tympanic membrane, external ear and ear canal normal. Tympanic membrane is not erythematous, not retracted and not bulging.  Nose: No mucosal edema or rhinorrhea. Right sinus exhibits no maxillary sinus tenderness and no frontal sinus tenderness. Left sinus exhibits no maxillary sinus tenderness and no frontal sinus tenderness.  Mouth/Throat: Uvula is midline, oropharynx is clear and moist and mucous membranes are normal.  Eyes: Conjunctivae normal, EOM and lids are normal. Pupils are equal, round, and reactive to light. No foreign bodies found.  Neck: Trachea normal and normal range of motion. Neck supple. Carotid bruit is not present. No mass and no thyromegaly present.  Cardiovascular: Normal rate, regular rhythm, S1 normal, S2 normal, normal heart sounds, intact distal pulses and normal pulses.  Exam reveals no gallop and no friction rub.   No murmur heard.      1 plus peripheral edema  Pulmonary/Chest: Effort normal and breath sounds normal. Not tachypneic. No respiratory distress. She has no decreased breath sounds. She has no wheezes. She has no rhonchi. She has no rales.  Abdominal: Soft. Normal appearance and bowel sounds are normal. There is no tenderness.  Neurological: She is alert.  Skin: Skin is warm, dry and intact.  No rash noted.  Psychiatric: Her speech is normal and behavior is normal. Judgment and thought content normal. Her mood appears not anxious. Cognition and memory are normal. She does not exhibit a depressed mood.          Assessment & Plan:

## 2012-06-28 NOTE — Patient Instructions (Addendum)
Apply hydrocortisone cream 2 % twice a day for two weeks on irritated rash above biopsy site. If not improving .Marland Kitchen Follow up with Dermatology sooner than 09/2012. Elevated feet above heart. Wear compression hose 15-30 mmHg. Work on regular exercise. Continue working on weight loss. Follow BP at home. Call if greater than 140/90  Make appt for medicare wellness no fasting labs prior in next few months.

## 2012-07-15 ENCOUNTER — Other Ambulatory Visit: Payer: Self-pay | Admitting: Family Medicine

## 2012-07-16 NOTE — Telephone Encounter (Signed)
rx called to pharmacy 

## 2012-07-18 DIAGNOSIS — R609 Edema, unspecified: Secondary | ICD-10-CM | POA: Insufficient documentation

## 2012-07-18 NOTE — Assessment & Plan Note (Signed)
Due to venous insufficiency. Elevate feet above heart. Wear compression hose 15-30 mmHg. Work on regular exercise. Continue working on weight loss.

## 2012-07-18 NOTE — Assessment & Plan Note (Signed)
Follow at home, borderline control today in office on amlodipine.

## 2012-07-18 NOTE — Assessment & Plan Note (Signed)
Well controlled. Continue current medication.  

## 2012-08-13 ENCOUNTER — Ambulatory Visit: Payer: Medicare Other | Admitting: Internal Medicine

## 2012-08-13 ENCOUNTER — Ambulatory Visit (INDEPENDENT_AMBULATORY_CARE_PROVIDER_SITE_OTHER): Payer: Medicare Other | Admitting: Family Medicine

## 2012-08-13 ENCOUNTER — Encounter: Payer: Self-pay | Admitting: Family Medicine

## 2012-08-13 VITALS — BP 140/78 | HR 80 | Temp 97.4°F | Ht 64.0 in | Wt 163.2 lb

## 2012-08-13 DIAGNOSIS — R609 Edema, unspecified: Secondary | ICD-10-CM

## 2012-08-13 DIAGNOSIS — R300 Dysuria: Secondary | ICD-10-CM

## 2012-08-13 DIAGNOSIS — G8929 Other chronic pain: Secondary | ICD-10-CM | POA: Insufficient documentation

## 2012-08-13 DIAGNOSIS — R10814 Left lower quadrant abdominal tenderness: Secondary | ICD-10-CM | POA: Insufficient documentation

## 2012-08-13 DIAGNOSIS — M549 Dorsalgia, unspecified: Secondary | ICD-10-CM

## 2012-08-13 LAB — POCT URINALYSIS DIPSTICK
Glucose, UA: NEGATIVE
Ketones, UA: NEGATIVE
Spec Grav, UA: 1.025

## 2012-08-13 MED ORDER — CIPROFLOXACIN HCL 500 MG PO TABS: 500.0000 mg | ORAL_TABLET | Freq: Two times a day (BID) | ORAL | Status: DC

## 2012-08-13 NOTE — Progress Notes (Signed)
  Subjective:    Patient ID: [name] [name], female    DOB: [birth year], 71 y.o.   MRN: [id number]  HPI  71 year old female with history of diverticular disease, hemorrhoids presents with 1 week of lower abdominal pain/pressure. More on left than right, now  Moving more centrally. No urgency, no frequency, good flow.  Pressure on urethra, no dysuria. Spasm feeling before urinating... Her symptoms have improved  Significantly with water.     No fever.  No Constipation/Diarrhea, some mild nausea, no emesis.  No blood in stool.  No CVA tenderness.  No change in diet. No new medications. Has started some dandelion root tea. Still having some preipheral  UA today is clear.   Review of Systems  Constitutional: Negative for fever and fatigue.  HENT: Negative for ear pain.   Eyes: Negative for pain.  Respiratory: Negative for chest tightness and shortness of breath.   Cardiovascular: Negative for chest pain, palpitations and leg swelling.  Gastrointestinal: Positive for abdominal pain.  Genitourinary: Negative for dysuria, vaginal discharge, vaginal pain and pelvic pain.  Musculoskeletal: Positive for back pain.       Objective:   Physical Exam  Constitutional: Vital signs are normal. She appears well-developed and well-nourished. She is cooperative.  Non-toxic appearance. She does not appear ill. No distress.  HENT:  Head: Normocephalic.  Right Ear: Hearing, tympanic membrane, external ear and ear canal normal. Tympanic membrane is not erythematous, not retracted and not bulging.  Left Ear: Hearing, tympanic membrane, external ear and ear canal normal. Tympanic membrane is not erythematous, not retracted and not bulging.  Nose: No mucosal edema or rhinorrhea. Right sinus exhibits no maxillary sinus tenderness and no frontal sinus tenderness. Left sinus exhibits no maxillary sinus tenderness and no frontal sinus tenderness.  Mouth/Throat: Uvula is midline, oropharynx is clear and moist  and mucous membranes are normal.  Eyes: Conjunctivae normal, EOM and lids are normal. Pupils are equal, round, and reactive to light. No foreign bodies found.  Neck: Trachea normal and normal range of motion. Neck supple. Carotid bruit is not present. No mass and no thyromegaly present.  Cardiovascular: Normal rate, regular rhythm, S1 normal, S2 normal, normal heart sounds, intact distal pulses and normal pulses.  Exam reveals no gallop and no friction rub.   No murmur heard. Pulmonary/Chest: Effort normal and breath sounds normal. Not tachypneic. No respiratory distress. She has no decreased breath sounds. She has no wheezes. She has no rhonchi. She has no rales.  Abdominal: Soft. Normal appearance and bowel sounds are normal. There is no hepatosplenomegaly. There is tenderness in the left lower quadrant. There is no rigidity, no rebound, no guarding, no CVA tenderness, no tenderness at McBurney's point and negative Murphy's sign. No hernia.  Neurological: She is alert.  Skin: Skin is warm, dry and intact. No rash noted.  Psychiatric: Her speech is normal and behavior is normal. Judgment and thought content normal. Her mood appears not anxious. Cognition and memory are normal. She does not exhibit a depressed mood.          Assessment & Plan:

## 2012-08-13 NOTE — Assessment & Plan Note (Signed)
Likely mild diverticulitis. No sign of UTi. trreat with cipro BID x 14 days. Follow up in 3 days. Push fluids, rest.

## 2012-08-13 NOTE — Patient Instructions (Addendum)
No sign of UTI. Physical exam suggests mild diverticulitis. Push fluids. Start cipro x 7 days. Follow up on Friday, if 100% better can call to cancel. If pain is worse, cannot keep down fluids, fever on antibiotics or blood in stool.. Call sooner. If pain severe.. Go to ER. Call if peripheral swelling not improving. Can try celebrex every other day.

## 2012-08-13 NOTE — Assessment & Plan Note (Signed)
May be due to celebrex as well.. Can try every other day as needed.

## 2012-08-13 NOTE — Assessment & Plan Note (Signed)
Well controlled on valium a tedtime. Does not take twice a day. Discussed trial of weanong off this. She will consider. She does not want to be on this med, but is happy with her pain control at this point. Consider referral to PMR in future. Plan to discuss this issue at CPX in 10/2011.

## 2012-08-15 ENCOUNTER — Other Ambulatory Visit: Payer: Self-pay | Admitting: Family Medicine

## 2012-08-15 MED ORDER — PANTOPRAZOLE SODIUM 40 MG PO TBEC: 40.0000 mg | DELAYED_RELEASE_TABLET | Freq: Two times a day (BID) | ORAL | Status: DC

## 2012-08-16 NOTE — Telephone Encounter (Signed)
Called to pharmacy 

## 2012-08-16 NOTE — Telephone Encounter (Signed)
Already refilled 12/12

## 2012-08-19 ENCOUNTER — Other Ambulatory Visit: Payer: Self-pay

## 2012-08-19 NOTE — Telephone Encounter (Signed)
Pt left v/m requesting refill voltaren gel to [facility] [address]Please advise.

## 2012-08-20 ENCOUNTER — Ambulatory Visit: Payer: Medicare Other | Admitting: Family Medicine

## 2012-08-20 MED ORDER — DICLOFENAC SODIUM 1 % TD GEL: 1.0000 "application " | Freq: Every evening | TRANSDERMAL | Status: DC | PRN

## 2012-08-20 NOTE — Telephone Encounter (Signed)
RX called in to [facility]

## 2012-08-22 ENCOUNTER — Ambulatory Visit: Payer: Medicare Other | Admitting: Family Medicine

## 2012-08-23 ENCOUNTER — Telehealth: Payer: Self-pay

## 2012-08-23 NOTE — Telephone Encounter (Signed)
Prior auth needed for volataren 1% gel; form is in Dr [employer] in box.

## 2012-08-26 ENCOUNTER — Telehealth: Payer: Self-pay

## 2012-08-26 NOTE — Telephone Encounter (Signed)
I already completed this a few days ago.

## 2012-08-26 NOTE — Telephone Encounter (Signed)
[facility] faxed another form for Volataren gel; for is in Dr Daphine Deutscher in box.

## 2012-08-26 NOTE — Telephone Encounter (Signed)
Opened in error

## 2012-09-17 ENCOUNTER — Ambulatory Visit: Payer: Medicare Other | Admitting: Internal Medicine

## 2012-09-26 ENCOUNTER — Ambulatory Visit: Payer: Medicare Other | Admitting: Internal Medicine

## 2012-10-08 ENCOUNTER — Encounter: Payer: Medicare Other | Admitting: Family Medicine

## 2012-11-08 ENCOUNTER — Ambulatory Visit: Payer: Medicare Other | Admitting: Family Medicine

## 2012-11-12 ENCOUNTER — Other Ambulatory Visit: Payer: Self-pay

## 2012-11-12 ENCOUNTER — Encounter: Payer: Self-pay | Admitting: Family Medicine

## 2012-11-12 ENCOUNTER — Ambulatory Visit (INDEPENDENT_AMBULATORY_CARE_PROVIDER_SITE_OTHER): Payer: Medicare Other | Admitting: Family Medicine

## 2012-11-12 ENCOUNTER — Ambulatory Visit: Payer: Medicare Other | Admitting: Family Medicine

## 2012-11-12 VITALS — BP 120/84 | HR 93 | Temp 97.9°F | Ht 64.0 in | Wt 163.5 lb

## 2012-11-12 DIAGNOSIS — I10 Essential (primary) hypertension: Secondary | ICD-10-CM

## 2012-11-12 DIAGNOSIS — R609 Edema, unspecified: Secondary | ICD-10-CM

## 2012-11-12 MED ORDER — DIAZEPAM 2 MG PO TABS: 1.0000 mg | ORAL_TABLET | Freq: Two times a day (BID) | ORAL | Status: DC | PRN

## 2012-11-12 MED ORDER — TRIAMTERENE-HCTZ 37.5-25 MG PO TABS: 1.0000 | ORAL_TABLET | Freq: Every day | ORAL | Status: DC | PRN

## 2012-11-12 MED ORDER — AMLODIPINE BESYLATE 10 MG PO TABS: 10.0000 mg | ORAL_TABLET | Freq: Every day | ORAL | Status: DC

## 2012-11-12 MED ORDER — TRIAMTERENE-HCTZ 37.5-25 MG PO TABS: 1.0000 | ORAL_TABLET | ORAL | Status: DC

## 2012-11-12 NOTE — Telephone Encounter (Signed)
Pt left v/m was seen today and has changed insurance co and needs name brand norvasc sent to [facility] [address]. Pt said her insurance no longer deals with [employer] so refills do not need to be cancelled. Pt notified med sent to [facility].

## 2012-11-12 NOTE — Assessment & Plan Note (Signed)
Well controlled. Continue current medication.  

## 2012-11-12 NOTE — Patient Instructions (Addendum)
Restart maxide every other day. Keep appt for CPX with labs to recheck sodium levels.

## 2012-11-12 NOTE — Progress Notes (Signed)
  Subjective:    Patient ID: [name] [name], female    DOB: [birth year], 72 y.o.   MRN: [id number]  HPI 72 year old female with history of HTN,  multiple thyroid nodules follewed by ENDO presents with continued peripheral edema since 08/2012. Constant at this point.  Started after coming off maxide in last year for low sodium (127)  Never started compression hose. Never tried celebrex every other day. She   Nml cr, LFTS, TSH in 06/2012.    Review of Systems  Constitutional: Negative for fever and fatigue.  HENT: Negative for ear pain.   Eyes: Negative for pain.  Respiratory: Positive for shortness of breath. Negative for chest tightness.        Mild SOB with exertion momemtarily or something tight around her waist, has not been exercsing like she used to. No parox noct dysmpneal  Cardiovascular: Positive for leg swelling. Negative for chest pain and palpitations.  Gastrointestinal: Negative for abdominal pain.  Genitourinary: Negative for dysuria.       Objective:   Physical Exam  Constitutional: Vital signs are normal. She appears well-developed and well-nourished. She is cooperative.  Non-toxic appearance. She does not appear ill. No distress.  HENT:  Head: Normocephalic.  Right Ear: Hearing, tympanic membrane, external ear and ear canal normal. Tympanic membrane is not erythematous, not retracted and not bulging.  Left Ear: Hearing, tympanic membrane, external ear and ear canal normal. Tympanic membrane is not erythematous, not retracted and not bulging.  Nose: No mucosal edema or rhinorrhea. Right sinus exhibits no maxillary sinus tenderness and no frontal sinus tenderness. Left sinus exhibits no maxillary sinus tenderness and no frontal sinus tenderness.  Mouth/Throat: Uvula is midline, oropharynx is clear and moist and mucous membranes are normal.  Eyes: Conjunctivae, EOM and lids are normal. Pupils are equal, round, and reactive to light. No foreign bodies found.  Neck:  Trachea normal and normal range of motion. Neck supple. Carotid bruit is not present. No mass and no thyromegaly present.  Cardiovascular: Normal rate, regular rhythm, S1 normal, S2 normal, normal heart sounds, intact distal pulses and normal pulses.  Exam reveals no gallop and no friction rub.   No murmur heard. 1-2+ pitting peripheral edema, B left greater than right  Pulmonary/Chest: Effort normal and breath sounds normal. Not tachypneic. No respiratory distress. She has no decreased breath sounds. She has no wheezes. She has no rhonchi. She has no rales.  Abdominal: Soft. Normal appearance and bowel sounds are normal. There is no tenderness.  Neurological: She is alert.  Skin: Skin is warm, dry and intact. No rash noted.  Psychiatric: Her speech is normal and behavior is normal. Judgment and thought content normal. Her mood appears not anxious. Cognition and memory are normal. She does not exhibit a depressed mood.          Assessment & Plan:

## 2012-11-12 NOTE — Assessment & Plan Note (Signed)
Past lab eval negative. No sign of heart failure. Most likely secondary to meds and venous insufficiency.  Pt refuses changing celebrex as she does not think this is causing it. Amlodipine may be contributing but this is controlling her hard to control BP well. She also refuses compression hose.  Pt reports that she was doing much better when on maxide ( stopped due to hyponatremia). We will try restarting this every other day prn only. Recheck Na in 1 month.

## 2012-11-19 ENCOUNTER — Ambulatory Visit: Payer: Medicare Other | Admitting: Internal Medicine

## 2012-12-13 ENCOUNTER — Telehealth: Payer: Self-pay | Admitting: Family Medicine

## 2012-12-13 ENCOUNTER — Other Ambulatory Visit: Payer: Medicare Other

## 2012-12-13 DIAGNOSIS — I10 Essential (primary) hypertension: Secondary | ICD-10-CM

## 2012-12-13 DIAGNOSIS — E7800 Pure hypercholesterolemia, unspecified: Secondary | ICD-10-CM

## 2012-12-13 NOTE — Telephone Encounter (Signed)
Message copied by Kerby Nora E on Fri Dec 13, 2012 10:07 AM ------      Message from: Alvina Chou      Created: Thu Dec 12, 2012 12:02 PM      Regarding: Lab orders for Tuesday, 4.15.14       Patient is scheduled for CPX labs, please order future labs, Thanks , Terri       ------

## 2012-12-17 ENCOUNTER — Other Ambulatory Visit (INDEPENDENT_AMBULATORY_CARE_PROVIDER_SITE_OTHER): Payer: Medicare Other

## 2012-12-17 DIAGNOSIS — E7800 Pure hypercholesterolemia, unspecified: Secondary | ICD-10-CM

## 2012-12-17 DIAGNOSIS — I10 Essential (primary) hypertension: Secondary | ICD-10-CM

## 2012-12-17 LAB — LIPID PANEL
Cholesterol: 194 mg/dL (ref 0–200)
Total CHOL/HDL Ratio: 3
Triglycerides: 117 mg/dL (ref 0.0–149.0)

## 2012-12-17 LAB — COMPREHENSIVE METABOLIC PANEL
Albumin: 4.5 g/dL (ref 3.5–5.2)
BUN: 21 mg/dL (ref 6–23)
Calcium: 9.2 mg/dL (ref 8.4–10.5)
Chloride: 102 mEq/L (ref 96–112)
Glucose, Bld: 93 mg/dL (ref 70–99)
Potassium: 3.9 mEq/L (ref 3.5–5.1)
Sodium: 138 mEq/L (ref 135–145)
Total Protein: 7.6 g/dL (ref 6.0–8.3)

## 2012-12-17 NOTE — Addendum Note (Signed)
Addended by: Alvina Chou on: 12/17/2012 08:32 AM   Modules accepted: Orders

## 2012-12-24 ENCOUNTER — Ambulatory Visit (INDEPENDENT_AMBULATORY_CARE_PROVIDER_SITE_OTHER): Payer: Medicare Other | Admitting: Family Medicine

## 2012-12-24 ENCOUNTER — Encounter: Payer: Self-pay | Admitting: Family Medicine

## 2012-12-24 VITALS — BP 140/88 | HR 85 | Temp 97.5°F | Ht 64.0 in | Wt 166.5 lb

## 2012-12-24 DIAGNOSIS — E7800 Pure hypercholesterolemia, unspecified: Secondary | ICD-10-CM

## 2012-12-24 DIAGNOSIS — Z0000 Encounter for general adult medical examination without abnormal findings: Secondary | ICD-10-CM

## 2012-12-24 DIAGNOSIS — E042 Nontoxic multinodular goiter: Secondary | ICD-10-CM

## 2012-12-24 DIAGNOSIS — I6529 Occlusion and stenosis of unspecified carotid artery: Secondary | ICD-10-CM

## 2012-12-24 DIAGNOSIS — R609 Edema, unspecified: Secondary | ICD-10-CM

## 2012-12-24 DIAGNOSIS — I10 Essential (primary) hypertension: Secondary | ICD-10-CM

## 2012-12-24 NOTE — Assessment & Plan Note (Signed)
Stable-- per endo 

## 2012-12-24 NOTE — Patient Instructions (Addendum)
Call if you decide you are interested in adding welchol to red yeast rice let me know.  Continue working on healthy eating habits and regular exercise.  Follow up in 6 months with fasting labs prior.

## 2012-12-24 NOTE — Assessment & Plan Note (Signed)
Inadequate control on red yeast rice. She has had SE to statins. She will look into welchol.

## 2012-12-24 NOTE — Assessment & Plan Note (Signed)
She will call to set up 1 year re-eval in 03/2013.

## 2012-12-24 NOTE — Assessment & Plan Note (Signed)
Will have her continue on maxide for now at 1/2 tab.

## 2012-12-24 NOTE — Progress Notes (Signed)
I have personally reviewed the Medicare Annual Wellness questionnaire and have noted 1. The patient's medical and social history 2. Their use of alcohol, tobacco or illicit drugs 3. Their current medications and supplements 4. The patient's functional ability including ADL's, fall risks, home safety risks and hearing or visual             impairment. 5. Diet and physical activities 6. Evidence for depression or mood disorders The patients weight, height, BMI and visual acuity have been recorded in the chart I have made referrals, counseling and provided education to the patient based review of the above and I have provided the pt with a written personalized care plan for preventive services.   Peripheral edema  AT LAST OV 11/2012   Past lab eval negative. No sign of heart failure. Most likely secondary to meds and venous insufficiency.  Pt refuses changing celebrex as she does not think this is causing it. Amlodipine may be contributing but this is controlling her hard to control BP well. She also refuses compression hose.  Pt reports that she was doing much better when on maxide ( stopped due to hyponatremia). We will try restarting this every other day prn only. Recheck Na in 1 month.        This lab check 1 month later.. Shows nml sodium. She has been doing better overall with swelling. Had none until today... Had high sodium meal today.  She did have a period of leg cramping that improved (she stopped maxide and started an electrolyte solution) Since then she has been using 1/2 tab of maxide daily in last 5 days.  She has restarted exercise at [facility].  Hypertension: Borderline control... She rushed here today. Using medication without problems or lightheadedness: None Chest pain with exertion:None  Edema:yes Short of breath:None  Average home BPs: 120/70 at home lately. Other issues:   Elevated Cholesterol:  4 tabs daily of red yeast rice.. Worsen control, now above  goal <70.  Lab Results  Component Value Date   CHOL 194 12/17/2012   HDL 57.00 12/17/2012   LDLCALC 114* 12/17/2012   LDLDIRECT 111.9 05/27/2010   TRIG 117.0 12/17/2012   CHOLHDL 3 12/17/2012   SE to statins. Using medications without problems:  Muscle aches: None  Diet compliance: orse lately over the winter Exercise:[employer], weekly... Increasing as able  Other complaints: right carotid artery stenosis  Wt Readings from Last 3 Encounters:  12/24/12 166 lb 8 oz (75.524 kg)  11/12/12 163 lb 8 oz (74.163 kg)  08/13/12 163 lb 4 oz (74.05 kg)     Thyroid Nodules: Muliptle thyroid nodules, followed by ENDO. BX showed no malignancy.Plan 6 month follow up US and TSH.  Lab Results  Component Value Date   TSH 1.20 06/24/2012   Continues to use valium regularly at bedtime. Sleeping better at night.  Using back brace as needed.  No falls, no sedation with this med.   Review of Systems  Constitutional: Negative for fever and fatigue.  HENT: Negative for ear pain.  Eyes: Negative for pain.  Respiratory: Negative for chest tightness and shortness of breath.  Cardiovascular: Negative for chest pain, palpitations and leg swelling.  Gastrointestinal: Negative for abdominal pain.  Genitourinary: Negative for dysuria.  Musculoskeletal: Negative for back pain.  Objective:   Physical Exam  Constitutional: Vital signs are normal. She appears well-developed and well-nourished. She is cooperative. Non-toxic appearance. She does not appear ill. No  distress.  HENT:  Head: Normocephalic.  Right Ear: Hearing, tympanic membrane, external ear and ear canal normal.  Left Ear: Hearing, tympanic membrane, external ear and ear canal normal.  Nose: Nose normal.  Eyes: Conjunctivae, EOM and lids are normal. Pupils are equal, round, and reactive to light. No foreign bodies found.  Neck: Trachea normal and normal range of motion. Neck supple. Carotid bruit is not present. No mass and no thyromegaly present.   Cardiovascular: Normal rate, regular rhythm, S1 normal, S2 normal, normal heart sounds and intact distal pulses. Exam reveals no gallop.  No murmur heard.  Breast Exam:  No masses, no axillary lymphadenopathy. Pulmonary/Chest: Effort normal and breath sounds normal. No respiratory distress. She has no wheezes. She has no rhonchi. She has no rales.  Abdominal: Soft. Normal appearance and bowel sounds are normal. She exhibits no distension, no fluid wave, no abdominal bruit and no mass. There is no hepatosplenomegaly. There is no tenderness. There is no rebound, no guarding and no CVA tenderness. No hernia.  Lymphadenopathy:  She has no cervical adenopathy.  She has no axillary adenopathy.  Neurological: She is alert. She has normal strength. No cranial nerve deficit or sensory deficit.  Skin: Skin is warm, dry and intact. No rash noted.  Psychiatric: Her speech is normal and behavior is normal. Judgment normal. Her mood appears not anxious. Cognition and memory are normal. She does not exhibit a depressed mood.  Assessment & Plan:   AMW: The patient's preventative maintenance and recommended screening tests for an annual wellness exam were reviewed in full today.  Brought up to date unless services declined.  Counselled on the importance of diet, exercise, and its role in overall health and mortality.  The patient's FH and SH was reviewed, including their home life, tobacco status, and drug and alcohol status.   Vaccines: Uptodate with shingles, Td and PNA. DEXA: osteopenia 06/2009, due.. Not interested.  Mammogram: breast exam nml today, refuses mammograms Colon: Dr. Leone Payor.. nml 2012,she is considering virtual colonoscopy instead at repeat. Mother with colon cancer.  DVE/PAP: no pap indicated, no risk factors for ovarian/uterine cancer..  No further DVE.

## 2012-12-24 NOTE — Assessment & Plan Note (Signed)
Well controlled. Continue current medication.  

## 2012-12-30 ENCOUNTER — Other Ambulatory Visit: Payer: Self-pay

## 2012-12-30 MED ORDER — PANTOPRAZOLE SODIUM 40 MG PO TBEC: 40.0000 mg | DELAYED_RELEASE_TABLET | Freq: Two times a day (BID) | ORAL | Status: DC

## 2012-12-30 NOTE — Telephone Encounter (Signed)
Pt left v/m requesting refill protonix to [facility]; no longer uses [facility] due to new ins.pt notified done.

## 2012-12-31 ENCOUNTER — Ambulatory Visit (INDEPENDENT_AMBULATORY_CARE_PROVIDER_SITE_OTHER): Payer: Medicare Other | Admitting: Internal Medicine

## 2012-12-31 ENCOUNTER — Encounter: Payer: Self-pay | Admitting: Internal Medicine

## 2012-12-31 VITALS — BP 130/80 | HR 78 | Ht 64.0 in | Wt 166.8 lb

## 2012-12-31 DIAGNOSIS — J309 Allergic rhinitis, unspecified: Secondary | ICD-10-CM

## 2012-12-31 DIAGNOSIS — R609 Edema, unspecified: Secondary | ICD-10-CM

## 2012-12-31 DIAGNOSIS — J302 Other seasonal allergic rhinitis: Secondary | ICD-10-CM

## 2012-12-31 NOTE — Progress Notes (Signed)
08/08/11-72 year old female never smoker followed for obstructive sleep apnea, allergic rhinitis LOV-08/09/10 She plans to get flu shot today at the drugstore. Continues daily use of CPAP 9/[facility] all night every night. Has lost 20 pounds with [employer] and would like to have her pressure reduced to 8. Being treated medically for her degenerative disc disease and back pain without surgery planned. Nasal congestion has not been a problem.  12/31/12- 72 year old female never smoker followed for obstructive sleep apnea, allergic rhinitis FOLLOWS FOR: now uses Elite Resmed machine now; wears CPAP every night for about 6-8 hours and states since new machine she is doing great(sleeps deeper and feels more alert during the day. This machine adjusts to what pressure she needs. CPAP/AutoPap/[facility] well tolerated with good control. No longer snoring. Minimal rhinitis symptoms this pollen season. Has not needed medications. Concerned about bilateral ankle edema and we discussed her calcium channel blocker.  ROS-see HPI Constitutional:   No-   weight loss, night sweats, fevers, chills, fatigue, lassitude. HEENT:   No-  headaches, difficulty swallowing, tooth/dental problems, sore throat,       No-  sneezing, itching, ear ache, nasal congestion, post nasal drip,  CV:  No-   chest pain, orthopnea, PND, +swelling in lower extremities, anasarca,                                  dizziness, palpitations Resp: No-   shortness of breath with exertion or at rest.              No-   productive cough,  No non-productive cough,  No- coughing up of blood.              No-   change in color of mucus.  No- wheezing.   Skin: No-   rash or lesions. GI:  No-   heartburn, indigestion, abdominal pain, nausea, vomiting, GU: . MS:  No-   joint pain or swelling.  No- decreased range of motion.  No- back pain. Neuro-     nothing unusual Psych:  No- change in mood or affect. No depression or anxiety.  No memory  loss.   OBJ General- Alert, Oriented, Affect-appropriate, Distress- none acute; looks well Skin- rash-none, lesions- none, excoriation- none Lymphadenopathy- none Head- atraumatic            Eyes- Gross vision intact, PERRLA, conjunctivae clear secretions            Ears- Hearing, canals-normal            Nose- Clear, no-Septal dev, mucus, polyps, erosion, perforation             Throat- Mallampati II-III , mucosa clear , drainage- none, tonsils- atrophic Neck- flexible , trachea midline, no stridor , thyroid nl, carotid no bruit Chest - symmetrical excursion , unlabored           Heart/CV- RRR , no murmur , no gallop  , no rub, nl s1 s2                           - JVD- none , edema-+2-3 bilateral ankle, stasis changes- none, varices- none           Lung- clear to P&A, wheeze- none, cough- none , dullness-none, rub- none           Chest wall-  Abd-  Br/ Gen/ Rectal- Not  done, not indicated Extrem- cyanosis- none, clubbing, none, atrophy- none, strength- nl Neuro- grossly intact to observation

## 2012-12-31 NOTE — Patient Instructions (Addendum)
We can continue CPAP autopap [facility]  Please call as needed

## 2013-01-08 NOTE — Assessment & Plan Note (Signed)
She is pleased with control so far this spring. No intervention required.

## 2013-01-08 NOTE — Assessment & Plan Note (Signed)
Bilateral ankle edema with negative Homans. No family history of peripheral venous insufficiency. Calcium channel blockers are capable of causing this, but I have asked her to discuss with her primary physician.

## 2013-01-14 ENCOUNTER — Encounter: Payer: Self-pay | Admitting: Family Medicine

## 2013-01-14 ENCOUNTER — Ambulatory Visit (INDEPENDENT_AMBULATORY_CARE_PROVIDER_SITE_OTHER): Payer: Medicare Other | Admitting: Family Medicine

## 2013-01-14 VITALS — BP 130/70 | HR 88 | Temp 97.5°F | Ht 64.0 in | Wt 164.5 lb

## 2013-01-14 DIAGNOSIS — I10 Essential (primary) hypertension: Secondary | ICD-10-CM

## 2013-01-14 DIAGNOSIS — R609 Edema, unspecified: Secondary | ICD-10-CM

## 2013-01-14 MED ORDER — AMLODIPINE BESYLATE 10 MG PO TABS: 5.0000 mg | ORAL_TABLET | Freq: Every day | ORAL | Status: DC

## 2013-01-14 MED ORDER — LOSARTAN POTASSIUM 100 MG PO TABS: 100.0000 mg | ORAL_TABLET | Freq: Every day | ORAL | Status: DC

## 2013-01-14 NOTE — Patient Instructions (Addendum)
Decrease amlodipine to 5 mg daily.  Start back cozaar 100 mg daily.  Stay on maxide for now.  Wear compression hose, elevate feet above heart.  Stop at front desk to set up referral for ECHO.  Follow up in 2 weeks 30 min OV.

## 2013-01-14 NOTE — Assessment & Plan Note (Signed)
Secondary to meds and venous insufficincy.  She is open now to decreasing amlodipine and changing her meds.  Will retry loisartan , amlodipine 5 mg daily and continue maxide. Will eval ECHo to make sure heart functioning well. Follow up in 2 weeks for re-eval.

## 2013-01-14 NOTE — Assessment & Plan Note (Signed)
Follow closely on new med regimen.

## 2013-01-14 NOTE — Progress Notes (Signed)
Subjective:    Patient ID: [name] [name], female    DOB: 09-13-1940, 72 y.o.   MRN: [id number]  HPI 72 year old female presents with persistent peripheral edema.   OV 11/2012    Past lab eval negative. No sign of heart failure. Most likely secondary to meds and venous insufficiency.  Pt refuses changing celebrex as she does not think this is causing it. Amlodipine may be contributing but this is controlling her hard to control BP well. She also refuses compression hose.  Pt reports that she was doing much better when on maxide ( stopped due to hyponatremia). We will try restarting this every other day prn only. Recheck Na in 1 month.        OV 4/22 She has been doing better overall with swelling. Had none until today... Had high sodium meal today.  She did have a period of leg cramping that improved (she stopped maxide and started an electrolyte solution)  Since then she has been using 1/2 tab of maxide daily. She has restarted exercise at [facility].  She comes to clinic today... As requested by Pulm per pt.  Dr. Maple Hudson...felt it was amlodipine she is on. She is concerned about her circulation. She has noted some darker spot on back on both fett.Marland Kitchen Resolved with elevation.  She has tried maxide back and forth...   Had history of swelling in past off and on. She states lasix did not help much in past. Last ECHO 2010 EF nml.     Review of Systems  Constitutional: Negative for fever and fatigue.  HENT: Negative for ear pain.   Eyes: Negative for pain.  Respiratory: Negative for chest tightness and shortness of breath.   Cardiovascular: Positive for leg swelling. Negative for chest pain and palpitations.  Gastrointestinal: Negative for abdominal pain.  Genitourinary: Negative for dysuria.       Objective:   Physical Exam  Constitutional: Vital signs are normal. She appears well-developed and well-nourished. She is cooperative.  Non-toxic appearance. She does not appear ill. No  distress.  HENT:  Head: Normocephalic.  Right Ear: Hearing, tympanic membrane, external ear and ear canal normal. Tympanic membrane is not erythematous, not retracted and not bulging.  Left Ear: Hearing, tympanic membrane, external ear and ear canal normal. Tympanic membrane is not erythematous, not retracted and not bulging.  Nose: No mucosal edema or rhinorrhea. Right sinus exhibits no maxillary sinus tenderness and no frontal sinus tenderness. Left sinus exhibits no maxillary sinus tenderness and no frontal sinus tenderness.  Mouth/Throat: Uvula is midline, oropharynx is clear and moist and mucous membranes are normal.  Eyes: Conjunctivae, EOM and lids are normal. Pupils are equal, round, and reactive to light. No foreign bodies found.  Neck: Trachea normal and normal range of motion. Neck supple. Carotid bruit is not present. No mass and no thyromegaly present.  Cardiovascular: Normal rate, regular rhythm, S1 normal, S2 normal, normal heart sounds, intact distal pulses and normal pulses.  Exam reveals no gallop and no friction rub.   No murmur heard. 1-2+ pitting peripheral edema, B left greater than right  Pulmonary/Chest: Effort normal and breath sounds normal. Not tachypneic. No respiratory distress. She has no decreased breath sounds. She has no wheezes. She has no rhonchi. She has no rales.  Abdominal: Soft. Normal appearance and bowel sounds are normal. There is no tenderness.  Neurological: She is alert.  Skin: Skin is warm, dry and intact. No rash noted.  Psychiatric: Her speech is normal and  behavior is normal. Judgment and thought content normal. Her mood appears not anxious. Cognition and memory are normal. She does not exhibit a depressed mood.          Assessment & Plan:

## 2013-01-15 ENCOUNTER — Telehealth: Payer: Self-pay

## 2013-01-15 NOTE — Telephone Encounter (Signed)
Pt seen 01/14/13; pt has not picked up new prescriptions yet; pt exercised last night and this morning swelling in feet and legs are minimal (much better than 01/14/13.) Pt remembered when previously tried Cozaar had severe dizziness. BP this AM 123/66. Pt wants to know if can continue Norvasc 10 mg daily, not take Cozaar, continue exercising and other insturctions given by Dr Ermalene Searing at 01/14/13 visit and reevaluate at the 2 week f/u appt.Please advise.

## 2013-01-15 NOTE — Telephone Encounter (Signed)
Pt picked up Norvasc 10 mg from [facility] pt only got # 15 tabs due to instructions taking 1/2 tab daily. Pt spoke with pharmacist and was told if Dr Daphine Deutscher office would call pharmacy would change instructions to one tab daily and pt could get # 15 more Norvasc with no additional copay. Please advise.

## 2013-01-15 NOTE — Telephone Encounter (Signed)
Medication changed yesterday at appt will await dr Ermalene Searing input

## 2013-01-15 NOTE — Telephone Encounter (Signed)
She can hold on adding back cozaar and can continue amlodipine 10 mg daily. Please refill as requested change back med list to amlodipine 10 mg daily and delete cozaar.

## 2013-01-16 ENCOUNTER — Telehealth: Payer: Self-pay

## 2013-01-16 ENCOUNTER — Other Ambulatory Visit (INDEPENDENT_AMBULATORY_CARE_PROVIDER_SITE_OTHER): Payer: Medicare Other

## 2013-01-16 ENCOUNTER — Other Ambulatory Visit: Payer: Self-pay

## 2013-01-16 DIAGNOSIS — R609 Edema, unspecified: Secondary | ICD-10-CM

## 2013-01-16 DIAGNOSIS — R0602 Shortness of breath: Secondary | ICD-10-CM

## 2013-01-16 DIAGNOSIS — R600 Localized edema: Secondary | ICD-10-CM

## 2013-01-16 MED ORDER — AMLODIPINE BESYLATE 10 MG PO TABS: 10.0000 mg | ORAL_TABLET | Freq: Every day | ORAL | Status: DC

## 2013-01-16 NOTE — Telephone Encounter (Signed)
When amlodipine called in earlier pharmacist was notified DAW but was not marked on med list. Now DAW marked.

## 2013-01-16 NOTE — Telephone Encounter (Signed)
Pt called back and notified as instructed. Spoke with pharmacist [facility]. Changed amlodipine 10 mg to take one tablet by mouth daily and Cozaar prescription was cancelled. Med list updated.pt notified done.

## 2013-01-21 ENCOUNTER — Telehealth: Payer: Self-pay | Admitting: Family Medicine

## 2013-01-21 DIAGNOSIS — R609 Edema, unspecified: Secondary | ICD-10-CM

## 2013-01-21 NOTE — Telephone Encounter (Signed)
Message copied by Excell Seltzer on Tue Jan 21, 2013  1:14 PM ------      Message from: [name] [name]      Created: Tue Jan 21, 2013  9:43 AM       Patient wants to know is she can get a doppler of her legs to check the circulation ------

## 2013-01-21 NOTE — Addendum Note (Signed)
Addended byKerby Nora E on: 01/21/2013 02:08 PM   Modules accepted: Orders

## 2013-01-21 NOTE — Telephone Encounter (Signed)
I will order B doppler although I think maybe an even better idea would be for her to first see a vascular doctor and they can do dopplers there is needed. Is she agreeable?

## 2013-01-21 NOTE — Telephone Encounter (Signed)
Patient wants to go to [facility] for vascular referral

## 2013-01-22 ENCOUNTER — Other Ambulatory Visit: Payer: Self-pay | Admitting: *Deleted

## 2013-01-22 DIAGNOSIS — R609 Edema, unspecified: Secondary | ICD-10-CM

## 2013-01-31 ENCOUNTER — Ambulatory Visit: Payer: Medicare Other | Admitting: Family Medicine

## 2013-02-03 ENCOUNTER — Encounter: Payer: Self-pay | Admitting: Radiology

## 2013-02-04 ENCOUNTER — Ambulatory Visit (INDEPENDENT_AMBULATORY_CARE_PROVIDER_SITE_OTHER): Payer: Medicare Other | Admitting: Family Medicine

## 2013-02-04 ENCOUNTER — Encounter: Payer: Self-pay | Admitting: Family Medicine

## 2013-02-04 VITALS — BP 130/88 | HR 78 | Temp 97.6°F | Ht 64.0 in | Wt 163.0 lb

## 2013-02-04 DIAGNOSIS — I10 Essential (primary) hypertension: Secondary | ICD-10-CM

## 2013-02-04 DIAGNOSIS — R600 Localized edema: Secondary | ICD-10-CM

## 2013-02-04 DIAGNOSIS — R609 Edema, unspecified: Secondary | ICD-10-CM

## 2013-02-04 MED ORDER — TRIAMTERENE-HCTZ 37.5-25 MG PO TABS: 0.5000 | ORAL_TABLET | Freq: Every day | ORAL | Status: DC | PRN

## 2013-02-04 NOTE — Assessment & Plan Note (Signed)
Due to venous insufficiency as well as medicaiton SE. Pt felt compression hose caused more swelling. She will contact medicap to see if correct size given or try lower compression strength.  Referral to vascular MD.

## 2013-02-04 NOTE — Patient Instructions (Addendum)
Try putting compression hose on first thing in AM.  Discuss with [employer] whether you were given the right size or try one step down on compression strength. Keep appt with vascular MD. Keep on working on exercise.

## 2013-02-04 NOTE — Assessment & Plan Note (Signed)
Well controlled on current medication. Pt remains hesitant about changing BP meds given past SE although amloidipine is likely contributing to peripheral edema.

## 2013-02-04 NOTE — Addendum Note (Signed)
Addended by: Consuello Masse on: 02/04/2013 09:07 AM   Modules accepted: Orders

## 2013-02-04 NOTE — Progress Notes (Signed)
  Subjective:    Patient ID: [name] [name], female    DOB: 19-Aug-1941, 72 y.o.   MRN: [id number]  HPI  72 year old female presents for follow up on HTN and peripheral edema.  BP is well controlled on 10 mg amlodipine and maxide... When had planned to retry ARB and decrease amlodipine, but she called back stating she did not want to do this. She felt that in past she had felt lightheaded on cozaar.  Compression hose.Jamal Maes these, but felt they caused dark spot on ankles. Today she reports that swelling is better.   She has an appointment to see vascular MD to evaluate venous insufficiency as well as her concerns about her fathers hx of DVT. 03/06/2013.  ECHO: nml EF, mild LVH, no diastolic dysfunction.   Review of Systems  Constitutional: Negative for fever and fatigue.  HENT: Negative for ear pain.   Eyes: Negative for pain.  Respiratory: Negative for chest tightness and shortness of breath.   Cardiovascular: Negative for chest pain, palpitations and leg swelling.  Gastrointestinal: Negative for abdominal pain.  Genitourinary: Negative for dysuria.       Objective:   Physical Exam  Constitutional: Vital signs are normal. She appears well-developed and well-nourished. She is cooperative.  Non-toxic appearance. She does not appear ill. No distress.  HENT:  Head: Normocephalic.  Right Ear: Hearing, tympanic membrane, external ear and ear canal normal. Tympanic membrane is not erythematous, not retracted and not bulging.  Left Ear: Hearing, tympanic membrane, external ear and ear canal normal. Tympanic membrane is not erythematous, not retracted and not bulging.  Nose: No mucosal edema or rhinorrhea. Right sinus exhibits no maxillary sinus tenderness and no frontal sinus tenderness. Left sinus exhibits no maxillary sinus tenderness and no frontal sinus tenderness.  Mouth/Throat: Uvula is midline, oropharynx is clear and moist and mucous membranes are normal.  Eyes: Conjunctivae, EOM  and lids are normal. Pupils are equal, round, and reactive to light. No foreign bodies found.  Neck: Trachea normal and normal range of motion. Neck supple. Carotid bruit is not present. No mass and no thyromegaly present.  Cardiovascular: Normal rate, regular rhythm, S1 normal, S2 normal, normal heart sounds, intact distal pulses and normal pulses.  Exam reveals no gallop and no friction rub.   No murmur heard. Pulmonary/Chest: Effort normal and breath sounds normal. Not tachypneic. No respiratory distress. She has no decreased breath sounds. She has no wheezes. She has no rhonchi. She has no rales.  Abdominal: Soft. Normal appearance and bowel sounds are normal. There is no tenderness.  Neurological: She is alert.  Skin: Skin is warm, dry and intact. No rash noted.  Psychiatric: Her speech is normal and behavior is normal. Judgment and thought content normal. Her mood appears not anxious. Cognition and memory are normal. She does not exhibit a depressed mood.          Assessment & Plan:

## 2013-02-05 ENCOUNTER — Telehealth: Payer: Self-pay | Admitting: *Deleted

## 2013-02-05 NOTE — Telephone Encounter (Signed)
Pt had rx for maxide refilled yesterday, she was only given 15 tabs with instructions to take 1/2 tab daily. She says she sometimes take a whole tab. She wants rx changed to 1 tab daily and a new rx sent in.

## 2013-02-05 NOTE — Telephone Encounter (Signed)
Change and refill as requested

## 2013-02-06 MED ORDER — TRIAMTERENE-HCTZ 37.5-25 MG PO TABS: 1.0000 | ORAL_TABLET | Freq: Every day | ORAL | Status: DC | PRN

## 2013-02-06 NOTE — Telephone Encounter (Signed)
done

## 2013-02-07 NOTE — Telephone Encounter (Signed)
Pt request status of new refill for maxide. Pharmacist at [facility] said rx ready for pick up and pharmacist discarded rx for 1/2 tab daily. Pt notified and voiced understanding.

## 2013-03-04 ENCOUNTER — Encounter: Payer: Self-pay | Admitting: Vascular Surgery

## 2013-03-05 ENCOUNTER — Encounter (INDEPENDENT_AMBULATORY_CARE_PROVIDER_SITE_OTHER): Payer: Medicare Other | Admitting: *Deleted

## 2013-03-05 ENCOUNTER — Ambulatory Visit (INDEPENDENT_AMBULATORY_CARE_PROVIDER_SITE_OTHER): Payer: Medicare Other | Admitting: Vascular Surgery

## 2013-03-05 ENCOUNTER — Encounter: Payer: Self-pay | Admitting: Vascular Surgery

## 2013-03-05 VITALS — BP 157/82 | HR 83 | Resp 16 | Ht 64.0 in | Wt 161.0 lb

## 2013-03-05 DIAGNOSIS — R609 Edema, unspecified: Secondary | ICD-10-CM

## 2013-03-05 DIAGNOSIS — I739 Peripheral vascular disease, unspecified: Secondary | ICD-10-CM | POA: Insufficient documentation

## 2013-03-05 DIAGNOSIS — I872 Venous insufficiency (chronic) (peripheral): Secondary | ICD-10-CM

## 2013-03-05 NOTE — Progress Notes (Signed)
[facility]ist [facility]  Patient name: [name] [name] MRN: [id number] DOB: 06-Aug-1941 Sex: female  REASON FOR CONSULT: bilateral lower extremity swelling. Referred by Dr. Kerby Nora.  HPI: [name] [name] is a 72 y.o. female states that she has had swelling in both lower extremities for approximately 6 months. Dr. Ermalene Searing made some adjustments with her medications and this helped her swelling. However she also felt that she had evidence of chronic venous insufficiency and she referred the patient for vascular evaluation. Patient denies any history of DVT. She denies any history of phlebitis. She does state that her father had problems with venous thrombosis.  He has tried compression stockings but did not tolerate these well. She states that the swelling is relieved with elevation. His also relieved when the temperatures cooler.  He denies any history of claudication, rest pain, or nonhealing ulcers.  Past Medical History  Diagnosis Date  . Hemorrhoids, internal, with bleeding   . Diverticulitis   . Vertigo   . Chronic lower back pain   . Scoliosis   . Vaginitis, atrophic   . OSA (obstructive sleep apnea)   . PUD (peptic ulcer disease)   . Arthritis   . GERD (gastroesophageal reflux disease)   . Hiatal hernia   . HLD (hyperlipidemia)   . Hypertension   . History of colon polyps   . Thyroid disease     hypo  . Thyroid nodule    Family History  Problem Relation Age of Onset  . Cancer Mother     colon   . Colon cancer Mother 31  . Heart disease Mother   . Hypertension Mother   . Heart disease Father     cad  . Deep vein thrombosis Father   . Heart attack Father   . Stomach cancer Sister   . Cancer Sister   . Diabetes Sister   . Heart disease Sister   . Hyperlipidemia Sister   . Hypertension Sister   . Diabetes Brother   . Heart disease Brother     Heart Disease before age 74  . Hyperlipidemia Brother   . Hypertension Brother   . Cancer Sister   .  Heart disease Brother   . Heart attack Brother   . Heart disease Brother   . Heart disease Brother    SOCIAL HISTORY: History  Substance Use Topics  . Smoking status: Never Smoker   . Smokeless tobacco: Never Used  . Alcohol Use: Yes     Comment: wine occassionally   Allergies  Allergen Reactions  . Ace Inhibitors     REACTION: swelling  . Beta Adrenergic Blockers     REACTION: swelling  . Statins     REACTION: myalgia  . Tramadol     Does not want to take again.   Constipation, decreased appetite, hot/chills, did not do well with it   Current Outpatient Prescriptions  Medication Sig Dispense Refill  . amLODipine (NORVASC) 10 MG tablet Take 1 tablet (10 mg total) by mouth daily.  30 tablet  11  . aspirin 81 MG tablet Take 81 mg by mouth daily.        . celecoxib (CELEBREX) 200 MG capsule Take 1 capsule (200 mg total) by mouth 2 (two) times daily.  180 capsule  3  . Cream Base (PHYTOBASE) CREA Apply topically daily.        . diazepam (VALIUM) 2 MG tablet Take 0.5-1 tablets (1-2 mg total) by mouth every 12 (twelve) hours  as needed for anxiety.  60 tablet  0  . Estradiol (ESTROGEL) 0.75 MG/1.25 GM (0.06%) topical gel Place 1.25 g onto the skin daily.        . pantoprazole (PROTONIX) 40 MG tablet Take 1 tablet (40 mg total) by mouth 2 (two) times daily.  180 tablet  1  . Red Yeast Rice 600 MG CAPS Take 4 capsules by mouth daily.       Marland Kitchen triamterene-hydrochlorothiazide (MAXZIDE-25) 37.5-25 MG per tablet Take 1 tablet by mouth daily as needed. BRAND NAME ONLY  30 tablet  6   No current facility-administered medications for this visit.   REVIEW OF SYSTEMS: Arly.Keller ] denotes positive finding; [  ] denotes negative finding  CARDIOVASCULAR:  [ ]  chest pain   [ ]  chest pressure   Arly.Keller ] palpitations   [ ]  orthopnea   Arly.Keller ] dyspnea on exertion   [ ]  claudication   [ ]  rest pain   [ ]  DVT   [ ]  phlebitis PULMONARY:   [ ]  productive cough   [ ]  asthma   [ ]  wheezing NEUROLOGIC:   [ ]  weakness  [  ] paresthesias  [ ]  aphasia  [ ]  amaurosis  [ ]  dizziness HEMATOLOGIC:   [ ]  bleeding problems   [ ]  clotting disorders MUSCULOSKELETAL:  [ ]  joint pain   [ ]  joint swelling Arly.Keller ] leg swelling GASTROINTESTINAL: [ ]   blood in stool  [ ]   hematemesis GENITOURINARY:  [ ]   dysuria  [ ]   hematuria PSYCHIATRIC:  [ ]  history of major depression INTEGUMENTARY:  [ ]  rashes  [ ]  ulcers CONSTITUTIONAL:  [ ]  fever   [ ]  chills  PHYSICAL EXAM: Filed Vitals:   03/05/13 1010  BP: 157/82  Pulse: 83  Resp: 16  Height: 5\' 4"  (1.626 m)  Weight: 161 lb (73.029 kg)  SpO2: 100%   Body mass index is 27.62 kg/(m^2). GENERAL: The patient is a well-nourished female, in no acute distress. The vital signs are documented above. CARDIOVASCULAR: There is a regular rate and rhythm. Do not appreciate carotid bruits. She has palpable femoral popliteal and dorsalis pedis pulses bilaterally. She has mild bilateral lower extremity swelling. PULMONARY: There is good air exchange bilaterally without wheezing or rales. ABDOMEN: Soft and non-tender with normal pitched bowel sounds.  MUSCULOSKELETAL: There are no major deformities or cyanosis. NEUROLOGIC: No focal weakness or paresthesias are detected. SKIN: There are no ulcers or rashes noted. He does have some hyperpigmentation bilaterally consistent with chronic venous insufficiency. PSYCHIATRIC: The patient has a normal affect.  DATA:  I have reviewed her records from Dr. Daphine Deutscher office. Her swelling was felt to be secondary to her medications and venous insufficiency. Since her medicines were changed she has had improvement in her swelling.  She has a known mild right carotid stenosis which is not changed in the last 3 years and is followed by Dr. Ermalene Searing.  I have independently interpreted her venous duplex scan. She has no evidence of DVT bilaterally. She has no evidence of superficial venous thrombosis. She does have some venous competence bilaterally.  MEDICAL  ISSUES: This patient does have evidence of bilateral chronic venous insufficiency. We have discussed the importance of intermittent leg elevation the proper positioning for this. I offered to write her a prescription for a mild compression stocking never she states that she does not tolerate these well. We also discussed water aerobics which does help patient with venous insufficiency but  she states she is unable to do this. I've encouraged her to stay as active as possible. I reassured her that she has excellent arterial flow. I'll be happy to see her back at any time if any new vascular issues arise.   Niko Penson S [facility] [place] [facility]: [phone]

## 2013-03-18 ENCOUNTER — Encounter: Payer: Self-pay | Admitting: Radiology

## 2013-03-18 ENCOUNTER — Ambulatory Visit (INDEPENDENT_AMBULATORY_CARE_PROVIDER_SITE_OTHER): Payer: Medicare Other | Admitting: Family Medicine

## 2013-03-18 ENCOUNTER — Encounter: Payer: Self-pay | Admitting: Family Medicine

## 2013-03-18 VITALS — BP 120/80 | HR 86 | Temp 97.6°F | Ht 64.0 in | Wt 163.0 lb

## 2013-03-18 DIAGNOSIS — M545 Low back pain, unspecified: Secondary | ICD-10-CM | POA: Insufficient documentation

## 2013-03-18 DIAGNOSIS — R52 Pain, unspecified: Secondary | ICD-10-CM

## 2013-03-18 DIAGNOSIS — G8929 Other chronic pain: Secondary | ICD-10-CM

## 2013-03-18 NOTE — Assessment & Plan Note (Signed)
Treat with NSAIDs, heat and refer to PT.

## 2013-03-18 NOTE — Progress Notes (Signed)
  Subjective:    Patient ID: [name] [name], female    DOB: [birth year], 72 y.o.   MRN: [id number]  HPI  72 year old female with history of chronic low back pain presents with right low back pain.. Stiff and catching. She has history of facet joint arthitis. Flare up over weekend after straining back lifting legs up for peripheral swelling.  Most pain with walking and leaning forward and back. Stiff after sitting a while. No radiculopathy, moderate in strength.  No numbness and tingling. No weakness. She has been wearing a back brace.  No falls.  Using celebrex.Marland Kitchen Helps joints but not back much. Has been using heat, doing home PT. In past PT has helped a lot.     Review of Systems  Constitutional: Negative for fever and fatigue.  HENT: Negative for ear pain.   Eyes: Negative for pain.  Respiratory: Negative for chest tightness and shortness of breath.   Cardiovascular: Negative for chest pain, palpitations and leg swelling.  Gastrointestinal: Negative for abdominal pain.  Genitourinary: Negative for dysuria.       Objective:   Physical Exam  Constitutional: Vital signs are normal. She appears well-developed and well-nourished. She is cooperative.  Non-toxic appearance. She does not appear ill. No distress.  HENT:  Head: Normocephalic.  Right Ear: Hearing, tympanic membrane, external ear and ear canal normal. Tympanic membrane is not erythematous, not retracted and not bulging.  Left Ear: Hearing, tympanic membrane, external ear and ear canal normal. Tympanic membrane is not erythematous, not retracted and not bulging.  Nose: No mucosal edema or rhinorrhea. Right sinus exhibits no maxillary sinus tenderness and no frontal sinus tenderness. Left sinus exhibits no maxillary sinus tenderness and no frontal sinus tenderness.  Mouth/Throat: Uvula is midline, oropharynx is clear and moist and mucous membranes are normal.  Eyes: Conjunctivae, EOM and lids are normal. Pupils are equal,  round, and reactive to light. No foreign bodies found.  Neck: Trachea normal and normal range of motion. Neck supple. Carotid bruit is not present. No mass and no thyromegaly present.  Cardiovascular: Normal rate, regular rhythm, S1 normal, S2 normal, normal heart sounds, intact distal pulses and normal pulses.  Exam reveals no gallop and no friction rub.   No murmur heard. B 1 plus pitting edema  Pulmonary/Chest: Effort normal and breath sounds normal. Not tachypneic. No respiratory distress. She has no decreased breath sounds. She has no wheezes. She has no rhonchi. She has no rales.  Abdominal: Soft. Normal appearance and bowel sounds are normal. There is no tenderness.  Musculoskeletal:       Thoracic back: Normal.       Lumbar back: She exhibits decreased range of motion and tenderness. She exhibits no bony tenderness, no swelling, no edema and no deformity.  Neg faber's , neg SLR.  increased pain with leaning back and forward.  Neurological: She is alert.  Skin: Skin is warm, dry and intact. No rash noted.  Psychiatric: Her speech is normal and behavior is normal. Judgment and thought content normal. Her mood appears not anxious. Cognition and memory are normal. She does not exhibit a depressed mood.          Assessment & Plan:

## 2013-03-18 NOTE — Patient Instructions (Signed)
Stop at front desk to set up referral to PT.

## 2013-03-19 ENCOUNTER — Encounter (INDEPENDENT_AMBULATORY_CARE_PROVIDER_SITE_OTHER): Payer: Medicare Other

## 2013-03-19 DIAGNOSIS — I6529 Occlusion and stenosis of unspecified carotid artery: Secondary | ICD-10-CM

## 2013-03-25 ENCOUNTER — Encounter: Payer: Self-pay | Admitting: *Deleted

## 2013-03-26 ENCOUNTER — Encounter: Payer: Self-pay | Admitting: Family Medicine

## 2013-03-28 ENCOUNTER — Encounter: Payer: Self-pay | Admitting: Family Medicine

## 2013-03-31 ENCOUNTER — Other Ambulatory Visit (INDEPENDENT_AMBULATORY_CARE_PROVIDER_SITE_OTHER): Payer: Medicare Other

## 2013-03-31 ENCOUNTER — Telehealth: Payer: Self-pay

## 2013-03-31 ENCOUNTER — Encounter: Payer: Self-pay | Admitting: Internal Medicine

## 2013-03-31 ENCOUNTER — Ambulatory Visit (INDEPENDENT_AMBULATORY_CARE_PROVIDER_SITE_OTHER): Payer: Medicare Other | Admitting: Internal Medicine

## 2013-03-31 VITALS — BP 128/70 | HR 87 | Temp 98.3°F | Wt 163.0 lb

## 2013-03-31 DIAGNOSIS — R002 Palpitations: Secondary | ICD-10-CM | POA: Insufficient documentation

## 2013-03-31 DIAGNOSIS — R609 Edema, unspecified: Secondary | ICD-10-CM

## 2013-03-31 LAB — T4, FREE: Free T4: 0.79 ng/dL (ref 0.60–1.60)

## 2013-03-31 LAB — TSH: TSH: 1.13 u[IU]/mL (ref 0.35–5.50)

## 2013-03-31 NOTE — Progress Notes (Signed)
Subjective:    Patient ID: [name] [name], female    DOB: Jan 03, 1941, 72 y.o.   MRN: [id number]  HPI Here with husband  Having ongoing issue with edema Got worse yesterday Felt her heart beating very hard also Still pounding yesterday and  last night when she went to bed (beating very hard--but not that fast) PT checked her vitals today (EMT also)----- 140/80, 170/80 and HR 100  Has been dizzy recently--feels off balance (like falling off to the left)--upon first standing up Some SOB--off and on she relates to "fluid"--gets better if she lies down (even at rest)  No chest pain Has pressure sensation in neck  Had been on dynacirc till it went out--then put on amlodipine  Current Outpatient Prescriptions on File Prior to Visit  Medication Sig Dispense Refill  . amLODipine (NORVASC) 10 MG tablet Take 1 tablet (10 mg total) by mouth daily.  30 tablet  11  . aspirin 81 MG tablet Take 81 mg by mouth daily.        . celecoxib (CELEBREX) 200 MG capsule Take 1 capsule (200 mg total) by mouth 2 (two) times daily.  180 capsule  3  . Cream Base (PHYTOBASE) CREA Apply topically daily.        . diazepam (VALIUM) 2 MG tablet Take 0.5-1 tablets (1-2 mg total) by mouth every 12 (twelve) hours as needed for anxiety.  60 tablet  0  . Estradiol (ESTROGEL) 0.75 MG/1.25 GM (0.06%) topical gel Place 1.25 g onto the skin daily.        . pantoprazole (PROTONIX) 40 MG tablet Take 1 tablet (40 mg total) by mouth 2 (two) times daily.  180 tablet  1  . Red Yeast Rice 600 MG CAPS Take 4 capsules by mouth daily.       Marland Kitchen triamterene-hydrochlorothiazide (MAXZIDE-25) 37.5-25 MG per tablet Take 1 tablet by mouth daily as needed. BRAND NAME ONLY  30 tablet  6   No current facility-administered medications on file prior to visit.    Allergies  Allergen Reactions  . Ace Inhibitors     REACTION: swelling  . Beta Adrenergic Blockers     REACTION: swelling  . Statins     REACTION: myalgia  . Tramadol     Does  not want to take again.   Constipation, decreased appetite, hot/chills, did not do well with it    Past Medical History  Diagnosis Date  . Hemorrhoids, internal, with bleeding   . Diverticulitis   . Vertigo   . Chronic lower back pain   . Scoliosis   . Vaginitis, atrophic   . OSA (obstructive sleep apnea)   . PUD (peptic ulcer disease)   . Arthritis   . GERD (gastroesophageal reflux disease)   . Hiatal hernia   . HLD (hyperlipidemia)   . Hypertension   . History of colon polyps   . Thyroid disease     hypo  . Thyroid nodule     Past Surgical History  Procedure Laterality Date  . Cholecystectomy      2004  . Ankle fusion  1967    LEFT  . Colonoscopy  06/22/2011    diverticulosis (no adenomas since 2001)  . Fracture surgery Left 1967    foot and ankle surgery    Family History  Problem Relation Age of Onset  . Cancer Mother     colon   . Colon cancer Mother 64  . Heart disease Mother   . Hypertension Mother   .  Heart disease Father     cad  . Deep vein thrombosis Father   . Heart attack Father   . Stomach cancer Sister   . Cancer Sister   . Diabetes Sister   . Heart disease Sister   . Hyperlipidemia Sister   . Hypertension Sister   . Diabetes Brother   . Heart disease Brother     Heart Disease before age 96  . Hyperlipidemia Brother   . Hypertension Brother   . Cancer Sister   . Heart disease Brother   . Heart attack Brother   . Heart disease Brother   . Heart disease Brother     History   Social History  . Marital Status: Married    Spouse Name: N/A    Number of Children: 1  . Years of Education: N/A   Occupational History  .     Social History Main Topics  . Smoking status: Never Smoker   . Smokeless tobacco: Never Used  . Alcohol Use: Yes     Comment: wine occassionally  . Drug Use: No  . Sexually Active: Not on file   Other Topics Concern  . Not on file   Social History Narrative   Sarika [place], has living will ( reviewed  2014)    Review of Systems No vomiting or diarrhea Appetite is okay Generally sleeps okay---bad night last night though (uses CPAP)    Objective:   Physical Exam  Constitutional: She appears well-developed and well-nourished. No distress.  Neck: Normal range of motion. Neck supple. No thyromegaly present.  Cardiovascular: Normal rate, regular rhythm, normal heart sounds and intact distal pulses.  Exam reveals no gallop.   No murmur heard. Pulmonary/Chest: Effort normal. No respiratory distress. She has no wheezes. She has no rales.  Abdominal: Soft. There is no tenderness.  Musculoskeletal: She exhibits edema.  2+ ankle edema Nothing much in calves now  Lymphadenopathy:    She has no cervical adenopathy.  Psychiatric: Her behavior is normal.  Slightly anxious          Assessment & Plan:

## 2013-03-31 NOTE — Assessment & Plan Note (Signed)
Also with some SOB Never irregular and highest documented rate 100 so doubt SVT EKG is normal---doubt this represents ischemia. But if symptoms persist, may want to consider stress test Symptoms suggest mild orthostasis---will cut the amlodipine (esp with the edema) May want to retry beta blocker (which she didn't tolerate in the past)

## 2013-03-31 NOTE — Patient Instructions (Signed)
Please cut the amlodipine in half and only take 5mg  daily for now.

## 2013-03-31 NOTE — Telephone Encounter (Signed)
Pt was at PT this AM and was sent to office to be seen; 03/30/13 pt started with SOB and fluid retention; today pt went to PT for her back and was sent to office to be seen; today pt still has SOB, lower legs swollen, fast heart beat and elevated BP at PT; 9:50 am BP 140/80; 10:00 am BP 170/80 P 100 and pulse ox 97 %, Now  P 87 and pulse ox 98%, Pt said no CP or neck pain but feels tight in neck. Dr Ermalene Searing not available; pt to see Dr Alphonsus Sias today at

## 2013-03-31 NOTE — Assessment & Plan Note (Signed)
Ongoing edema May be related to the amlodipine Also with some orthostasis----will cut the amlodpine

## 2013-03-31 NOTE — Telephone Encounter (Signed)
See OV note.  

## 2013-04-01 ENCOUNTER — Ambulatory Visit: Payer: Medicare Other | Admitting: Internal Medicine

## 2013-04-01 ENCOUNTER — Encounter: Payer: Self-pay | Admitting: *Deleted

## 2013-04-03 ENCOUNTER — Encounter: Payer: Self-pay | Admitting: Family Medicine

## 2013-04-03 ENCOUNTER — Ambulatory Visit (INDEPENDENT_AMBULATORY_CARE_PROVIDER_SITE_OTHER): Payer: Medicare Other | Admitting: Family Medicine

## 2013-04-03 VITALS — BP 162/76 | HR 64 | Temp 97.8°F | Wt 163.0 lb

## 2013-04-03 DIAGNOSIS — I10 Essential (primary) hypertension: Secondary | ICD-10-CM

## 2013-04-03 DIAGNOSIS — R609 Edema, unspecified: Secondary | ICD-10-CM

## 2013-04-03 DIAGNOSIS — R002 Palpitations: Secondary | ICD-10-CM

## 2013-04-03 DIAGNOSIS — E871 Hypo-osmolality and hyponatremia: Secondary | ICD-10-CM

## 2013-04-03 MED ORDER — CLONIDINE HCL 0.1 MG/24HR TD PTWK: 1.0000 | MEDICATED_PATCH | TRANSDERMAL | Status: DC

## 2013-04-03 NOTE — Assessment & Plan Note (Addendum)
Inadequate control today. I have convinced pt to decrease amlodipine and to try clonidine patches for better BP and edema control. Will follow closely.

## 2013-04-03 NOTE — Progress Notes (Signed)
  Subjective:    Patient ID: [name] [name], female    DOB: 1941/02/09, 72 y.o.   MRN: [id number]  HPI 13 yer old female presents following episode of palpitations as well worsened continued edema (swollen all over per pt). Heart was pounding. She felt dazed. Husband told her she felt clammy BP was 140/86-170/80 HR 97-100 at PT that day.  No chest pain,  shortness of breath second day AM.  She did have some pressure in neck on both sides.  Saw Dr. Alphonsus Sias on 7/28 EKG was stable. Dr. Alphonsus Sias had her drop amlodipine to 5 mg daily. She did not do this. She felt it was not from this. Symptoms improved on its own in lasted 2 days.  She has never had a stress test. 01/2013 [facility] showed mild diastolic dysfunction.    Review of Systems  Constitutional: Negative for fever and fatigue.  HENT: Negative for ear pain.   Eyes: Negative for pain.  Respiratory: Negative for chest tightness and shortness of breath.   Cardiovascular: Negative for chest pain, palpitations and leg swelling.  Gastrointestinal: Negative for abdominal pain.  Genitourinary: Negative for dysuria.       Objective:   Physical Exam  Constitutional: Vital signs are normal. She appears well-developed and well-nourished. She is cooperative.  Non-toxic appearance. She does not appear ill. No distress.  HENT:  Head: Normocephalic.  Right Ear: Hearing, tympanic membrane, external ear and ear canal normal. Tympanic membrane is not erythematous, not retracted and not bulging.  Left Ear: Hearing, tympanic membrane, external ear and ear canal normal. Tympanic membrane is not erythematous, not retracted and not bulging.  Nose: No mucosal edema or rhinorrhea. Right sinus exhibits no maxillary sinus tenderness and no frontal sinus tenderness. Left sinus exhibits no maxillary sinus tenderness and no frontal sinus tenderness.  Mouth/Throat: Uvula is midline, oropharynx is clear and moist and mucous membranes are normal.  Eyes: Conjunctivae,  EOM and lids are normal. Pupils are equal, round, and reactive to light. No foreign bodies found.  Neck: Trachea normal and normal range of motion. Neck supple. Carotid bruit is not present. No mass and no thyromegaly present.  Cardiovascular: Normal rate, regular rhythm, S1 normal, S2 normal, normal heart sounds, intact distal pulses and normal pulses.  Exam reveals no gallop and no friction rub.   No murmur heard. 1 plus nonpitting edema B ankles  Pulmonary/Chest: Effort normal and breath sounds normal. Not tachypneic. No respiratory distress. She has no decreased breath sounds. She has no wheezes. She has no rhonchi. She has no rales.  Abdominal: Soft. Normal appearance and bowel sounds are normal. There is no tenderness.  Neurological: She is alert.  Skin: Skin is warm, dry and intact. No rash noted.  Psychiatric: Her speech is normal and behavior is normal. Judgment and thought content normal. Her mood appears not anxious. Cognition and memory are normal. She does not exhibit a depressed mood.          Assessment & Plan:

## 2013-04-03 NOTE — Assessment & Plan Note (Addendum)
Resolved. Avoid caffiene. Pt refuses BBlocker. If recurring could consider holter monitor.

## 2013-04-03 NOTE — Patient Instructions (Addendum)
   Stop at lab on your way out. Decrease amlodipine to 5 mg daily. Start clonidine patch weekly. Follow up in 2-3 weeks for BP check. Follow Bp and pulse at home ... Call if not at goal. Call sooner if you are not tolerating the patch.

## 2013-04-03 NOTE — Assessment & Plan Note (Signed)
Multifactorial. Likely amlodipine is worsening ... Will try to decrease or stop amlodipine.

## 2013-04-04 ENCOUNTER — Telehealth: Payer: Self-pay

## 2013-04-04 ENCOUNTER — Encounter: Payer: Self-pay | Admitting: Family Medicine

## 2013-04-04 LAB — COMPREHENSIVE METABOLIC PANEL
ALT: 39 U/L — ABNORMAL HIGH (ref 0–35)
AST: 28 U/L (ref 0–37)
Albumin: 5.2 g/dL (ref 3.5–5.2)
Alkaline Phosphatase: 88 U/L (ref 39–117)
Glucose, Bld: 86 mg/dL (ref 70–99)
Potassium: 3.9 mEq/L (ref 3.5–5.1)
Sodium: 133 mEq/L — ABNORMAL LOW (ref 135–145)
Total Protein: 8.5 g/dL — ABNORMAL HIGH (ref 6.0–8.3)

## 2013-04-04 MED ORDER — LOSARTAN POTASSIUM 50 MG PO TABS: 50.0000 mg | ORAL_TABLET | Freq: Every day | ORAL | Status: DC

## 2013-04-04 MED ORDER — VALSARTAN 80 MG PO TABS: 80.0000 mg | ORAL_TABLET | Freq: Every day | ORAL | Status: DC

## 2013-04-04 NOTE — Telephone Encounter (Signed)
[place] medicaiton atre not necessarily bad, but if she is hesitant we willtry different med. We will try diovan. Stay on maxide for now. Decrease amlodipine to 5 mg daily as discused.  Please call pharmacy to cancel cozaar order because this was ineffective in past.

## 2013-04-04 NOTE — Telephone Encounter (Signed)
Pt concerned about trying clonidine patch; pt has concerns about this medication and wants to try a name brand ARB.[facility] [address]. Pt request a cb.

## 2013-04-04 NOTE — Telephone Encounter (Signed)
What are pt concerns about clonidine patch?  She had stated that she had issues with diovan in past which is an ARB.

## 2013-04-04 NOTE — Telephone Encounter (Signed)
Pt states she's concerned because clonidine is an older medicine, and her pharmacist told her it was on back order and he doesn't know when he will be able to get it.  If ok with you she would like to go back to cozaar or another ARB.  She doesn't think she had any real issues with these meds in the past, cozaar or diovan.  States issues were very vague and she probably should never have mentioned anything.

## 2013-04-04 NOTE — Telephone Encounter (Signed)
Spoke with patient and advised results rx canceled

## 2013-04-15 ENCOUNTER — Ambulatory Visit: Payer: Medicare Other | Admitting: Family Medicine

## 2013-04-17 ENCOUNTER — Ambulatory Visit: Payer: Medicare Other | Admitting: Family Medicine

## 2013-04-25 ENCOUNTER — Encounter: Payer: Self-pay | Admitting: Family Medicine

## 2013-04-25 ENCOUNTER — Ambulatory Visit (INDEPENDENT_AMBULATORY_CARE_PROVIDER_SITE_OTHER): Payer: Medicare Other | Admitting: Family Medicine

## 2013-04-25 VITALS — BP 126/76 | HR 91 | Temp 97.8°F | Ht 64.0 in | Wt 166.5 lb

## 2013-04-25 DIAGNOSIS — I10 Essential (primary) hypertension: Secondary | ICD-10-CM

## 2013-04-25 DIAGNOSIS — R609 Edema, unspecified: Secondary | ICD-10-CM

## 2013-04-25 NOTE — Progress Notes (Signed)
  Subjective:    Patient ID: [name] [name], female    DOB: 1940-11-01, 72 y.o.   MRN: [id number]  HPI  72 year old female presents for follow up.  Hypertension:  Improved control with addition of diovan to amlodipine (she continued to refuse decreasing amlodipine to see if decreases swelling, she also refused clonidine, was nervous about this med) She stopped maxide on her own. Using medication without problems or lightheadedness: Occ Chest pain with exertion:None Edema:Swelling is much better now. Short of breath:None Occ palpitations. Average home BPs:126/71, HR 70-79 Other issues:  Wt Readings from Last 3 Encounters:  04/25/13 166 lb 8 oz (75.524 kg)  04/03/13 163 lb (73.936 kg)  03/31/13 163 lb (73.936 kg)        Review of Systems  Constitutional: Negative for fever and fatigue.  HENT: Negative for ear pain.   Eyes: Negative for pain.  Respiratory: Negative for chest tightness and shortness of breath.   Cardiovascular: Negative for chest pain, palpitations and leg swelling.  Gastrointestinal: Negative for abdominal pain.  Genitourinary: Negative for dysuria.       Objective:   Physical Exam  Constitutional: Vital signs are normal. She appears well-developed and well-nourished. She is cooperative.  Non-toxic appearance. She does not appear ill. No distress.  HENT:  Head: Normocephalic.  Right Ear: Hearing, tympanic membrane, external ear and ear canal normal. Tympanic membrane is not erythematous, not retracted and not bulging.  Left Ear: Hearing, tympanic membrane, external ear and ear canal normal. Tympanic membrane is not erythematous, not retracted and not bulging.  Nose: No mucosal edema or rhinorrhea. Right sinus exhibits no maxillary sinus tenderness and no frontal sinus tenderness. Left sinus exhibits no maxillary sinus tenderness and no frontal sinus tenderness.  Mouth/Throat: Uvula is midline, oropharynx is clear and moist and mucous membranes are normal.   Eyes: Conjunctivae, EOM and lids are normal. Pupils are equal, round, and reactive to light. Lids are everted and swept, no foreign bodies found.  Neck: Trachea normal and normal range of motion. Neck supple. Carotid bruit is not present. No mass and no thyromegaly present.  Cardiovascular: Normal rate, regular rhythm, S1 normal, S2 normal, normal heart sounds, intact distal pulses and normal pulses.  Exam reveals no gallop and no friction rub.   No murmur heard. Significant improvement in B edema.. Almost gone!  Pulmonary/Chest: Effort normal and breath sounds normal. Not tachypneic. No respiratory distress. She has no decreased breath sounds. She has no wheezes. She has no rhonchi. She has no rales.  Abdominal: Soft. Normal appearance and bowel sounds are normal. There is no tenderness.  Neurological: She is alert.  Skin: Skin is warm, dry and intact. No rash noted.  Psychiatric: Her speech is normal and behavior is normal. Judgment and thought content normal. Her mood appears not anxious. Cognition and memory are normal. She does not exhibit a depressed mood.          Assessment & Plan:

## 2013-04-25 NOTE — Patient Instructions (Addendum)
Continue working on regular exercise and healthy lifestyle. Follow BPs at home.

## 2013-05-01 NOTE — Assessment & Plan Note (Signed)
Improved with improved BP control despite her still being on amlodipine.Marland Kitchen

## 2013-05-01 NOTE — Assessment & Plan Note (Signed)
Well controlled. Continue current medication.  

## 2013-05-02 ENCOUNTER — Other Ambulatory Visit: Payer: Self-pay

## 2013-05-02 MED ORDER — CELECOXIB 200 MG PO CAPS: 200.0000 mg | ORAL_CAPSULE | Freq: Two times a day (BID) | ORAL | Status: DC

## 2013-05-02 NOTE — Telephone Encounter (Signed)
Pt request refill celebrex to [facility] [address].Please advise.

## 2013-05-05 ENCOUNTER — Encounter: Payer: Self-pay | Admitting: Family Medicine

## 2013-05-06 ENCOUNTER — Other Ambulatory Visit: Payer: Self-pay | Admitting: Family Medicine

## 2013-05-06 MED ORDER — DIAZEPAM 2 MG PO TABS: 1.0000 mg | ORAL_TABLET | Freq: Two times a day (BID) | ORAL | Status: DC | PRN

## 2013-05-06 NOTE — Telephone Encounter (Signed)
Last filled 11/12/2012.

## 2013-05-07 NOTE — Telephone Encounter (Signed)
Rx called to pharmacy

## 2013-05-09 ENCOUNTER — Ambulatory Visit (INDEPENDENT_AMBULATORY_CARE_PROVIDER_SITE_OTHER): Payer: Medicare Other | Admitting: Family Medicine

## 2013-05-09 ENCOUNTER — Encounter: Payer: Self-pay | Admitting: Family Medicine

## 2013-05-09 VITALS — BP 130/70 | HR 79 | Temp 98.3°F | Ht 64.0 in | Wt 165.8 lb

## 2013-05-09 DIAGNOSIS — G8929 Other chronic pain: Secondary | ICD-10-CM

## 2013-05-09 DIAGNOSIS — R52 Pain, unspecified: Secondary | ICD-10-CM

## 2013-05-09 DIAGNOSIS — M549 Dorsalgia, unspecified: Secondary | ICD-10-CM

## 2013-05-09 DIAGNOSIS — M545 Low back pain, unspecified: Secondary | ICD-10-CM

## 2013-05-09 MED ORDER — CYCLOBENZAPRINE HCL 10 MG PO TABS: 5.0000 mg | ORAL_TABLET | Freq: Every evening | ORAL | Status: DC | PRN

## 2013-05-09 NOTE — Assessment & Plan Note (Signed)
New muscle strain. Add flexeril. Continue NSAIDs, ice, massage, stretching.  Follow up if not better in 2 weeks.

## 2013-05-09 NOTE — Patient Instructions (Addendum)
Gentle stretching, ice, massage, celebrex, muscle relaxant as needed. Follow up if not improved in 2 weeks.

## 2013-05-09 NOTE — Progress Notes (Signed)
  Subjective:    Patient ID: [name] [name], female    DOB: 1941/03/13, 72 y.o.   MRN: [id number]  HPI  72 year old female with history of chronic back pain returns with new acute back pain.  She has been going to PT in last few weeks... Was given active release technique with dry needles, gentle stretches, ice and electrical stimulation. Had to change therapist... She feels like when new therapist applied a stretching belt... She had  New  aching pain in right low back.  No radiation to legs, no weakness, no numbness.  She has been wearing back brace.  Using celebrex  2 times a day. Helped some. She has also applied tens unit. She has been icing. She is requesting flexeril for muscle spasms.     Review of Systems  Constitutional: Negative for fever and fatigue.  HENT: Negative for ear pain.   Eyes: Negative for pain.  Respiratory: Negative for chest tightness and shortness of breath.   Cardiovascular: Negative for chest pain, palpitations and leg swelling.  Gastrointestinal: Negative for abdominal pain.  Genitourinary: Negative for dysuria.       Objective:   Physical Exam  Constitutional: Vital signs are normal. She appears well-developed and well-nourished. She is cooperative.  Non-toxic appearance. She does not appear ill. No distress.  HENT:  Head: Normocephalic.  Right Ear: Hearing, tympanic membrane, external ear and ear canal normal. Tympanic membrane is not erythematous, not retracted and not bulging.  Left Ear: Hearing, tympanic membrane, external ear and ear canal normal. Tympanic membrane is not erythematous, not retracted and not bulging.  Nose: No mucosal edema or rhinorrhea. Right sinus exhibits no maxillary sinus tenderness and no frontal sinus tenderness. Left sinus exhibits no maxillary sinus tenderness and no frontal sinus tenderness.  Mouth/Throat: Uvula is midline, oropharynx is clear and moist and mucous membranes are normal.  Eyes: Conjunctivae, EOM and  lids are normal. Pupils are equal, round, and reactive to light. Lids are everted and swept, no foreign bodies found.  Neck: Trachea normal and normal range of motion. Neck supple. Carotid bruit is not present. No mass and no thyromegaly present.  Cardiovascular: Normal rate, regular rhythm, S1 normal, S2 normal, normal heart sounds, intact distal pulses and normal pulses.  Exam reveals no gallop and no friction rub.   No murmur heard. Pulmonary/Chest: Effort normal and breath sounds normal. Not tachypneic. No respiratory distress. She has no decreased breath sounds. She has no wheezes. She has no rhonchi. She has no rales.  Abdominal: Soft. Normal appearance and bowel sounds are normal. There is no tenderness.  Musculoskeletal:       Lumbar back: She exhibits decreased range of motion and tenderness. She exhibits no bony tenderness, no swelling and no edema.  Neg SLR, neg faber's  Neurological: She is alert.  Skin: Skin is warm, dry and intact. No rash noted.  Psychiatric: Her speech is normal and behavior is normal. Judgment and thought content normal. Her mood appears not anxious. Cognition and memory are normal. She does not exhibit a depressed mood.          Assessment & Plan:

## 2013-05-14 ENCOUNTER — Telehealth: Payer: Self-pay

## 2013-05-14 NOTE — Telephone Encounter (Signed)
Prior auth required for Valium; form in Dr Daphine Deutscher in box. Pt has called to ck on refill; advised PA process.

## 2013-05-16 NOTE — Telephone Encounter (Signed)
Approval letter for namebrand Valium received. [facility] notified and rx went thru. Pt notified and very appreciative.Put on Dr Daphine Deutscher desk for signature and scanning.

## 2013-05-16 NOTE — Telephone Encounter (Signed)
Pt has spoken with insurance co. And advised to add comment to PA form for Valium that pt had tried diazepam but had allergy to some of the additives to diazepam and pt was placed back on name brand.

## 2013-05-16 NOTE — Telephone Encounter (Signed)
Done as instructed; Called [phone] spoke with Fricelda; she did urgent request and was sent to clinical dept. Faxed response for approval or denial in 24 hours. Ref # [id number].

## 2013-05-16 NOTE — Telephone Encounter (Signed)
From now was placed in outbox, now picked up by front office.  Please find and add comment as requested.

## 2013-06-04 ENCOUNTER — Encounter: Payer: Self-pay | Admitting: Family Medicine

## 2013-06-10 ENCOUNTER — Other Ambulatory Visit (INDEPENDENT_AMBULATORY_CARE_PROVIDER_SITE_OTHER): Payer: Medicare Other

## 2013-06-10 ENCOUNTER — Encounter: Payer: Self-pay | Admitting: *Deleted

## 2013-06-10 ENCOUNTER — Telehealth: Payer: Self-pay | Admitting: Family Medicine

## 2013-06-10 DIAGNOSIS — I10 Essential (primary) hypertension: Secondary | ICD-10-CM

## 2013-06-10 DIAGNOSIS — E7800 Pure hypercholesterolemia, unspecified: Secondary | ICD-10-CM

## 2013-06-10 LAB — COMPREHENSIVE METABOLIC PANEL
ALT: 19 U/L (ref 0–35)
AST: 14 U/L (ref 0–37)
Albumin: 4.7 g/dL (ref 3.5–5.2)
Alkaline Phosphatase: 76 U/L (ref 39–117)
BUN: 23 mg/dL (ref 6–23)
CO2: 27 mEq/L (ref 19–32)
Calcium: 9.5 mg/dL (ref 8.4–10.5)
Chloride: 104 mEq/L (ref 96–112)
Creatinine, Ser: 0.7 mg/dL (ref 0.4–1.2)
GFR: 84.21 mL/min (ref >60.00)
Glucose, Bld: 88 mg/dL (ref 70–99)
Potassium: 4.1 mEq/L (ref 3.5–5.1)
Sodium: 139 mEq/L (ref 135–145)
Total Bilirubin: 0.5 mg/dL (ref 0.3–1.2)
Total Protein: 8 g/dL (ref 6.0–8.3)

## 2013-06-10 LAB — LIPID PANEL
Cholesterol: 209 mg/dL — ABNORMAL HIGH (ref 0–200)
Total CHOL/HDL Ratio: 3
Triglycerides: 117 mg/dL (ref 0.0–149.0)
VLDL: 23.4 mg/dL (ref 0.0–40.0)

## 2013-06-10 NOTE — Telephone Encounter (Signed)
Message copied by Excell Seltzer on Tue Jun 10, 2013  7:24 AM ------      Message from: Alvina Chou      Created: Fri May 30, 2013  4:35 PM      Regarding: Lab orders for, Tuesday 10.7.14       Lab orders for a 6 month f/u ------

## 2013-06-12 ENCOUNTER — Ambulatory Visit (INDEPENDENT_AMBULATORY_CARE_PROVIDER_SITE_OTHER): Payer: Medicare Other | Admitting: Podiatrist

## 2013-06-12 ENCOUNTER — Encounter: Payer: Self-pay | Admitting: Podiatrist

## 2013-06-12 VITALS — BP 145/88 | HR 87 | Temp 97.8°F | Resp 14 | Ht 64.0 in | Wt 164.0 lb

## 2013-06-12 DIAGNOSIS — B351 Tinea unguium: Secondary | ICD-10-CM

## 2013-06-12 DIAGNOSIS — M79609 Pain in unspecified limb: Secondary | ICD-10-CM

## 2013-06-12 NOTE — Patient Instructions (Signed)
Call if any problems or concerns arise  

## 2013-06-12 NOTE — Progress Notes (Signed)
HPI: Patient presents today for follow up of foot and nail care. Past medical history, meds, and allergies reviewed.   Objective:  Neurovascular status unchanged with palpable pedal pulses and neurological sensation intact and unchanged Patients nails b/l 1st  are elongated, thickened, discolored, dystrophic with ingrown deformity present.  + hyperkeratotic and pre-ulcerative lesions present submet 2 b/l due to equinus deformity b/l    Assessment: painful mycotic nail x 2.   hyperkeratotic lesion x 1  Plan: Discussed treatment options and alternatives. Debrided nails without complication. Debrided hyperkeratotic lesions without complication.  Return appointment recommended at routine intervals of 3 months. Wrote rx for [employer] for inserts and shoe buildup   Marlowe Aschoff, DPM        Discussed etiology, pathology, conservative vs. Surgical therapies and at this time debridement of symptomatic toenails was recommended.  Onychoreduction of symptomatic toenails was performed without iatrogenic incident.  Patient was instructed on signs and symptoms of infection and was told to call immediately should any of these arise.

## 2013-06-17 ENCOUNTER — Encounter: Payer: Self-pay | Admitting: Family Medicine

## 2013-06-17 ENCOUNTER — Ambulatory Visit (INDEPENDENT_AMBULATORY_CARE_PROVIDER_SITE_OTHER): Payer: Medicare Other | Admitting: Family Medicine

## 2013-06-17 VITALS — BP 146/78 | HR 79 | Temp 98.1°F | Ht 64.0 in | Wt 164.5 lb

## 2013-06-17 DIAGNOSIS — E7800 Pure hypercholesterolemia, unspecified: Secondary | ICD-10-CM

## 2013-06-17 DIAGNOSIS — I10 Essential (primary) hypertension: Secondary | ICD-10-CM

## 2013-06-17 DIAGNOSIS — R609 Edema, unspecified: Secondary | ICD-10-CM

## 2013-06-17 DIAGNOSIS — Z23 Encounter for immunization: Secondary | ICD-10-CM

## 2013-06-17 NOTE — Patient Instructions (Signed)
Look into adding welchol for cholesterol control.

## 2013-06-17 NOTE — Assessment & Plan Note (Addendum)
Inadequate control, despite red yeast rice. Recommended addition welchol. She will consider and let me know.

## 2013-06-17 NOTE — Progress Notes (Deleted)
  Subjective:    Patient ID: [name] [name], female    DOB: 1940-09-26, 72 y.o.   MRN: [id number]  HPI    Review of Systems     Objective:   Physical Exam        Assessment & Plan:

## 2013-06-17 NOTE — Assessment & Plan Note (Signed)
Well controlled. Continue current medication.  

## 2013-06-17 NOTE — Progress Notes (Signed)
72 year old female presents for 6 months follow up.  Hypertension: Borderline control... She rushed here today.  BP Readings from Last 3 Encounters:  06/17/13 146/78  06/12/13 145/88  05/09/13 130/70   Using medication without problems or lightheadedness: None  Chest pain with exertion:None  Edema:yes, but improved from recently Short of breath:None  Average home BPs: 120/70 at home lately.  Other issues:   Elevated Cholesterol: 4 tabs daily of red yeast rice.. Worsen control,  LDL now above goal <70.  Lab Results  Component Value Date   CHOL 209* 06/10/2013   HDL 76.80 06/10/2013   LDLCALC 114* 12/17/2012   LDLDIRECT 114.4 06/10/2013   TRIG 117.0 06/10/2013   CHOLHDL 3 06/10/2013  SE to statins.  Using medications without problems:  Muscle aches: None  Diet compliance: worse lately over the winter  Exercise: No longer [facility], weekly... Increasing as able  Other complaints: right carotid artery stenosis  Wt Readings from Last 3 Encounters:  06/17/13 164 lb 8 oz (74.617 kg)  06/12/13 164 lb (74.39 kg)  05/09/13 165 lb 12 oz (75.184 kg)   Thyroid Nodules: Muliptle thyroid nodules, followed by ENDO. BX showed no malignancy.Plan 6 month follow up US and TSH.  Lab Results  Component Value Date   TSH 1.13 03/31/2013   Continues to use valium regularly at bedtime. Sleeping better at night.  Using back brace as needed.  No falls, no sedation with this med.  She has been having right trochanteric bursitis... Has improved with injection by Dr. Noel Gerold.  Review of Systems  Constitutional: Negative for fever and fatigue.  HENT: Negative for ear pain.  Eyes: Negative for pain.  Respiratory: Negative for chest tightness and shortness of breath.  Cardiovascular: Negative for chest pain, palpitations and leg swelling.  Gastrointestinal: Negative for abdominal pain.  Genitourinary: Negative for dysuria.  Musculoskeletal: Negative for back pain.  Objective:   Physical Exam   Constitutional: Vital signs are normal. She appears well-developed and well-nourished. She is cooperative. Non-toxic appearance. She does not appear ill. No distress.  HENT:  Head: Normocephalic.  Right Ear: Hearing, tympanic membrane, external ear and ear canal normal.  Left Ear: Hearing, tympanic membrane, external ear and ear canal normal.  Nose: Nose normal.  Eyes: Conjunctivae, EOM and lids are normal. Pupils are equal, round, and reactive to light. No foreign bodies found.  Neck: Trachea normal and normal range of motion. Neck supple. Carotid bruit is not present. No mass and no thyromegaly present.  Cardiovascular: Normal rate, regular rhythm, S1 normal, S2 normal, normal heart sounds and intact distal pulses. Exam reveals no gallop.  No murmur heard.  Breast Exam: No masses, no axillary lymphadenopathy.  Pulmonary/Chest: Effort normal and breath sounds normal. No respiratory distress. She has no wheezes. She has no rhonchi. She has no rales.  Abdominal: Soft. Normal appearance and bowel sounds are normal. She exhibits no distension, no fluid wave, no abdominal bruit and no mass. There is no hepatosplenomegaly. There is no tenderness. There is no rebound, no guarding and no CVA tenderness. No hernia.  Lymphadenopathy:  She has no cervical adenopathy.  She has no axillary adenopathy.  Neurological: She is alert. She has normal strength. No cranial nerve deficit or sensory deficit.  Skin: Skin is warm, dry and intact. No rash noted.  Psychiatric: Her speech is normal and behavior is normal. Judgment normal. Her mood appears not anxious. Cognition and memory are normal. She does not exhibit a depressed mood.  NO PERIPHERAL EDEMA

## 2013-06-17 NOTE — Assessment & Plan Note (Signed)
Improved control. 

## 2013-06-19 ENCOUNTER — Other Ambulatory Visit: Payer: Medicare Other

## 2013-06-26 ENCOUNTER — Ambulatory Visit: Payer: Self-pay | Admitting: Podiatrist

## 2013-06-26 ENCOUNTER — Encounter: Payer: Self-pay | Admitting: Orthopaedic Surgery

## 2013-06-26 ENCOUNTER — Ambulatory Visit: Payer: Medicare Other | Admitting: Family Medicine

## 2013-07-01 ENCOUNTER — Ambulatory Visit (INDEPENDENT_AMBULATORY_CARE_PROVIDER_SITE_OTHER): Payer: Medicare Other | Admitting: Family Medicine

## 2013-07-01 ENCOUNTER — Encounter: Payer: Self-pay | Admitting: Family Medicine

## 2013-07-01 VITALS — BP 112/68 | HR 84 | Temp 97.7°F | Wt 166.0 lb

## 2013-07-01 DIAGNOSIS — R252 Cramp and spasm: Secondary | ICD-10-CM

## 2013-07-01 DIAGNOSIS — IMO0001 Reserved for inherently not codable concepts without codable children: Secondary | ICD-10-CM

## 2013-07-01 DIAGNOSIS — M542 Cervicalgia: Secondary | ICD-10-CM | POA: Insufficient documentation

## 2013-07-01 DIAGNOSIS — I10 Essential (primary) hypertension: Secondary | ICD-10-CM

## 2013-07-01 LAB — CBC WITH DIFFERENTIAL/PLATELET
Basophils Absolute: 0 10*3/uL (ref 0.0–0.1)
Basophils Relative: 0.5 % (ref 0.0–3.0)
Eosinophils Absolute: 0.1 10*3/uL (ref 0.0–0.7)
Eosinophils Relative: 1 % (ref 0.0–5.0)
HCT: 44.7 % (ref 36.0–46.0)
Lymphocytes Relative: 32.9 % (ref 12.0–46.0)
Lymphs Abs: 2.7 10*3/uL (ref 0.7–4.0)
MCHC: 33.6 g/dL (ref 30.0–36.0)
MCV: 90.8 fl (ref 78.0–100.0)
Monocytes Absolute: 0.5 10*3/uL (ref 0.1–1.0)
Platelets: 314 10*3/uL (ref 150.0–400.0)
WBC: 8.1 10*3/uL (ref 4.5–10.5)

## 2013-07-01 LAB — COMPREHENSIVE METABOLIC PANEL
ALT: 22 U/L (ref 0–35)
AST: 18 U/L (ref 0–37)
Albumin: 4.4 g/dL (ref 3.5–5.2)
Alkaline Phosphatase: 83 U/L (ref 39–117)
BUN: 19 mg/dL (ref 6–23)
Calcium: 9.3 mg/dL (ref 8.4–10.5)
Glucose, Bld: 94 mg/dL (ref 70–99)
Potassium: 4 mEq/L (ref 3.5–5.1)
Sodium: 136 mEq/L (ref 135–145)
Total Bilirubin: 0.9 mg/dL (ref 0.3–1.2)

## 2013-07-01 LAB — CK: Total CK: 46 U/L (ref 7–177)

## 2013-07-01 NOTE — Assessment & Plan Note (Signed)
Well controlled on current meds. Doubt SE.

## 2013-07-01 NOTE — Assessment & Plan Note (Signed)
Eval electrolytes.

## 2013-07-01 NOTE — Progress Notes (Signed)
Subjective:    Patient ID: [name] [name], female    DOB: 12/20/1940, 72 y.o.   MRN: [id number]  HPI  Hypertension:  Well controlled on amlodipine and diovan. Using medication without problems or lightheadedness:  Chest pain with exertion: Edema:no swelling today. Short of breath: Average home BPs: Other issues:  She has noted some muscles and joints aching.. She noted some mild when she started red yeast rice. Muscle ache has worsened in the last month. SE to statin in past.  Cramps in feet. Rubbing muscles with volatren gel.. This helps.  She is taking 4 tabs daily of red yeast rice.. She has been on this for several years.  She is not interested in welchol.   She has started going back to [facility] .Marland Kitchen Only able to exercsie upper.  Neck muscle sore.. She is concerned now that it radiates to left jaw. She is going to PT for neck strain. She also has a very tender area in left cervical gland area.. Tender to touch. Ongoing for 5 weeks. No sore throat. No fever. No smoker.   Review of Systems  Constitutional: Negative for fever and fatigue.  HENT: Negative for ear pain.   Eyes: Negative for pain.  Respiratory: Negative for chest tightness and shortness of breath.   Cardiovascular: Negative for chest pain, palpitations and leg swelling.  Gastrointestinal: Negative for abdominal pain.  Genitourinary: Negative for dysuria.       Objective:   Physical Exam  Constitutional: Vital signs are normal. She appears well-developed and well-nourished. She is cooperative.  Non-toxic appearance. She does not appear ill. No distress.  HENT:  Head: Normocephalic.  Right Ear: Hearing, tympanic membrane, external ear and ear canal normal. Tympanic membrane is not erythematous, not retracted and not bulging.  Left Ear: Hearing, tympanic membrane, external ear and ear canal normal. Tympanic membrane is not erythematous, not retracted and not bulging.  Nose: No mucosal edema or rhinorrhea.  Right sinus exhibits no maxillary sinus tenderness and no frontal sinus tenderness. Left sinus exhibits no maxillary sinus tenderness and no frontal sinus tenderness.  Mouth/Throat: Uvula is midline, oropharynx is clear and moist and mucous membranes are normal. No oral lesions. Normal dentition. No oropharyngeal exudate, posterior oropharyngeal edema, posterior oropharyngeal erythema or tonsillar abscesses.  Eyes: Conjunctivae, EOM and lids are normal. Pupils are equal, round, and reactive to light. Lids are everted and swept, no foreign bodies found.  Neck: Trachea normal and normal range of motion. Neck supple. Carotid bruit is not present. No mass and no thyromegaly present.  Cardiovascular: Normal rate, regular rhythm, S1 normal, S2 normal, normal heart sounds, intact distal pulses and normal pulses.  Exam reveals no gallop and no friction rub.   No murmur heard. Pulmonary/Chest: Effort normal and breath sounds normal. Not tachypneic. No respiratory distress. She has no decreased breath sounds. She has no wheezes. She has no rhonchi. She has no rales.  Abdominal: Soft. Normal appearance and bowel sounds are normal. There is no tenderness.  Musculoskeletal:       Cervical back: She exhibits decreased range of motion and tenderness. She exhibits no bony tenderness and no swelling.  ttp over left trapezius and at base of occiput over insertion of trapezius.  Diffuse body ache.  Lymphadenopathy:    She has cervical adenopathy.       Right cervical: Superficial cervical adenopathy present.       Left cervical: Superficial cervical adenopathy present.  Slight B cervical node swelling.. Tender over left, none  on right.  Neurological: She is alert.  Skin: Skin is warm, dry and intact. No rash noted.  Psychiatric: Her speech is normal and behavior is normal. Judgment and thought content normal. Her mood appears not anxious. Cognition and memory are normal. She does not exhibit a depressed mood.           Assessment & Plan:

## 2013-07-01 NOTE — Patient Instructions (Addendum)
Continue PT. Stop at lab.. We will call with results Can use voltaren gel. Stop red yeast rice.. For 1-2 week. If symptoms resolve... Try to slowly restart to lowest effective dose without SE. Also start CoQ10. If no resolution of muscle ache off red yeast rice.. Call. Call if left gland area tenderness not improving.

## 2013-07-01 NOTE — Assessment & Plan Note (Signed)
?   SE to red yeast rice... Stop... Try to restart at lowest effective dose with co Q 10. ? Connected to left tonsillar ttp.. ? Viral infeciton but has been ongoing for 5 weeks. Eval with labs.

## 2013-07-01 NOTE — Assessment & Plan Note (Signed)
Posterior neck pain likely due to trapezius muscle strain.. Continue NSAID, PT etc. Anterior left neck pain... ttp over left tonsillar area. No clear sign of infeciton in ears, throat... Will eval with cbc.

## 2013-07-05 ENCOUNTER — Encounter: Payer: Self-pay | Admitting: Orthopaedic Surgery

## 2013-07-16 ENCOUNTER — Other Ambulatory Visit: Payer: Self-pay | Admitting: *Deleted

## 2013-07-16 NOTE — Telephone Encounter (Signed)
Last office visit 07/01/2013.  Ok to refill? 

## 2013-07-17 MED ORDER — DIAZEPAM 2 MG PO TABS: 1.0000 mg | ORAL_TABLET | Freq: Two times a day (BID) | ORAL | Status: DC | PRN

## 2013-07-17 NOTE — Telephone Encounter (Signed)
Called to [facility] [address] [place]. 

## 2013-07-25 ENCOUNTER — Telehealth: Payer: Self-pay

## 2013-07-25 NOTE — Telephone Encounter (Addendum)
Pt said she had spoken with [facility] and name brand Diovan will not be on 2015 formulary list and pt was advised to have Dr Daphine Deutscher office call [facility] for prior auth [phone]. Pt request cb when gets answer from [facility].

## 2013-08-04 ENCOUNTER — Encounter: Payer: Self-pay | Admitting: Orthopaedic Surgery

## 2013-08-21 ENCOUNTER — Telehealth: Payer: Self-pay

## 2013-08-21 NOTE — Telephone Encounter (Addendum)
Pt received form from [facility] for BP medication to be switched; pt had tried Cozaar and had issues; pt wants to know if can get namebrand Norvasc and Diovan approved for 2015. Pt request cb. Pt will fax copy of [facility] letter she received.

## 2013-08-25 ENCOUNTER — Other Ambulatory Visit: Payer: Self-pay | Admitting: *Deleted

## 2013-08-25 NOTE — Telephone Encounter (Signed)
Ok to refill 

## 2013-08-26 MED ORDER — DIAZEPAM 2 MG PO TABS: 1.0000 mg | ORAL_TABLET | Freq: Two times a day (BID) | ORAL | Status: DC | PRN

## 2013-08-26 NOTE — Telephone Encounter (Signed)
Called to [facility] [address] 

## 2013-08-26 NOTE — Telephone Encounter (Signed)
Pharmacist at [facility] left voicemail stating that [name] [name] normally gets Brand Name Only- Valium 2 mg and needs prescription called in stating that.  Spoke with Kathlene November (pharmacist) and verbal order given for Dispense as Written-Brand Name Only.

## 2013-09-03 NOTE — Telephone Encounter (Signed)
Prior auth forms for Norvasc and Diovan have been received and completed. Per insurance auth cannot be submitted until after 09/04/13. Will fax forms to ins company on 09/05/13

## 2013-09-04 ENCOUNTER — Encounter: Payer: Self-pay | Admitting: Orthopaedic Surgery

## 2013-09-08 ENCOUNTER — Other Ambulatory Visit: Payer: Self-pay | Admitting: *Deleted

## 2013-09-08 ENCOUNTER — Other Ambulatory Visit: Payer: Self-pay

## 2013-09-08 DIAGNOSIS — I6523 Occlusion and stenosis of bilateral carotid arteries: Secondary | ICD-10-CM

## 2013-09-08 MED ORDER — PANTOPRAZOLE SODIUM 40 MG PO TBEC: 40.0000 mg | DELAYED_RELEASE_TABLET | Freq: Two times a day (BID) | ORAL | Status: DC

## 2013-09-08 MED ORDER — CELECOXIB 200 MG PO CAPS: 200.0000 mg | ORAL_CAPSULE | Freq: Two times a day (BID) | ORAL | Status: DC

## 2013-09-08 MED ORDER — AMLODIPINE BESYLATE 10 MG PO TABS: 5.0000 mg | ORAL_TABLET | Freq: Every day | ORAL | Status: DC

## 2013-09-08 NOTE — Telephone Encounter (Signed)
Approval form received by insurance company on Norvasc, still pending response on Diovan. Will send to provider for signature, then to be scanned into patients medical record. Pharmacy notified via fax.

## 2013-09-10 NOTE — Telephone Encounter (Signed)
Prior auth for Norvasc and Diovan approved as noted on another phone note.

## 2013-09-10 NOTE — Telephone Encounter (Signed)
Approval form received by insurance company for [place]. Will send to provider for signature, then to be scanned into patients medical record.

## 2013-09-11 ENCOUNTER — Encounter: Payer: Self-pay | Admitting: Podiatrist

## 2013-09-11 ENCOUNTER — Ambulatory Visit (INDEPENDENT_AMBULATORY_CARE_PROVIDER_SITE_OTHER): Payer: Medicare Other | Admitting: Podiatrist

## 2013-09-11 VITALS — BP 143/80 | HR 80 | Resp 16

## 2013-09-11 DIAGNOSIS — B351 Tinea unguium: Secondary | ICD-10-CM

## 2013-09-11 DIAGNOSIS — M79609 Pain in unspecified limb: Secondary | ICD-10-CM

## 2013-09-11 NOTE — Progress Notes (Signed)
HPI: Patient presents today for follow up of foot and nail care. Past medical history, meds, and allergies reviewed.  Objective: Neurovascular status unchanged with palpable pedal pulses and neurological sensation intact and unchanged Patients nails b/l 1st are elongated, thickened, discolored, dystrophic with ingrown deformity present. + hyperkeratotic and pre-ulcerative lesions present submet 2 b/l due to equinus deformity b/l  Assessment: painful mycotic nail x 2. hyperkeratotic lesion x 1  Plan: Discussed treatment options and alternatives. Debrided nails without complication. Debrided hyperkeratotic lesions without complication. Return appointment recommended at routine intervals of 3 months. Patient has shoes from [employer] she is pleased with the buildup and acomidation-- she relates pain the the sinus tarsi of the left foot which is probably from break in to the new shoe.  She will have the shoe adjusted and will call if this doesn't help.  I offered injection therapy and she will wait and see  How she does

## 2013-09-23 NOTE — Telephone Encounter (Signed)
Pt heard from [facility] and pt wanted a prior authorization to received name brand Norvasc. Pt said PA for Norvasc done was to reduce the cost of the med. Pt request another PA submitted for authorization to receive brand name Norvasc only. Pt request cb at [id number] when completed.

## 2013-09-25 ENCOUNTER — Encounter (INDEPENDENT_AMBULATORY_CARE_PROVIDER_SITE_OTHER): Payer: Medicare Other

## 2013-09-25 DIAGNOSIS — I6529 Occlusion and stenosis of unspecified carotid artery: Secondary | ICD-10-CM

## 2013-09-25 DIAGNOSIS — I6523 Occlusion and stenosis of bilateral carotid arteries: Secondary | ICD-10-CM

## 2013-10-01 ENCOUNTER — Other Ambulatory Visit: Payer: Self-pay | Admitting: *Deleted

## 2013-10-01 ENCOUNTER — Telehealth: Payer: Self-pay | Admitting: *Deleted

## 2013-10-01 MED ORDER — NORVASC 10 MG PO TABS: 5.0000 mg | ORAL_TABLET | Freq: Every day | ORAL | Status: DC

## 2013-10-01 NOTE — Telephone Encounter (Signed)
Pt was notified by [place] that insurance had approved brand Norvasc, as per approval notification paperwork from insurance company. I will resend rx as DAW brand only.

## 2013-10-01 NOTE — Telephone Encounter (Signed)
Pt called to check on status of PA for norvasc, I advise pt to have pharmacy try to put the medication through and if it needs a PA they will fax over the PA form letting us know we need to start a PA

## 2013-10-05 ENCOUNTER — Encounter: Payer: Self-pay | Admitting: Orthopaedic Surgery

## 2013-10-08 ENCOUNTER — Telehealth: Payer: Self-pay | Admitting: Family Medicine

## 2013-10-08 MED ORDER — GUAIFENESIN-CODEINE 100-10 MG/5ML PO SYRP: 5.0000 mL | ORAL_SOLUTION | Freq: Every evening | ORAL | Status: DC | PRN

## 2013-10-08 NOTE — Telephone Encounter (Signed)
Call in cough rx.

## 2013-10-08 NOTE — Telephone Encounter (Signed)
Patient Information:  [name] Name: Corrissa  Phone: [phone]  Patient: [name], [name]  Gender: Female  DOB: 03/02/1941  Age: 73 Years  PCP: Eliezer Lofts (Family Practice)  Office Follow Up:  Does the office need to follow up with this patient?: Yes  Instructions For The Office: Please see sxs below, pt denies need for appt, only requesting recommendations for cough syrup or Rx for cough syrup she has previously had.   Symptoms  Reason For Call & Symptoms: 08/28/14 runny nose and cough on and off, lingering due to winter allergies.  Pt states she has these sxs every year.  10/07/13 nose started stinging/burning and watery eyes and cough came back.  Pt states sxs seem to be brought on by the wind and heat in the house/dust.    10/08/13 runny nose with cough.  Afebrile.  Taking Claritin.  States she bought some Mucinex to try also.   Advised pt of need for appt.  Pt denies need for appt and doesn't want to come in to office and expose herself to any illnesses.  Requesting if Dr Diona Browner can recommend a cough syrup or call something in for her.  States she has previously given her a cough syrup that worked welll.  Pt uses [facility] on [address] History In EMR: Yes  Reviewed Medications In EMR: Yes  Reviewed Allergies In EMR: Yes  Reviewed Surgeries / Procedures: Yes  Date of Onset of Symptoms: 08/28/2013  Treatments Tried: Claritin  Treatments Tried Worked: No  Guideline(s) Used:  Hay Fever - Nasal Allergies  Disposition Per Guideline:   See Today in Office  Reason For Disposition Reached:   Lots of coughing  Advice Given:  For a Stuffy Nose - Use Nasal Washes:  Introduction: Saline (salt water) nasal irrigation (nasal washes) is an effective and simple home remedy for treating stuffy nose and sinus congestion. The nose can be irrigated by pouring, spraying, or squirting salt water into the nose and then letting it run back out.  How it Helps: The salt water  rinses out excess mucus, washes out any irritants (dust, allergens) that might be present, and moistens the nasal cavity.  Antihistamine Medications for Hay Fever:  Antihistamines help reduce sneezing, itching, and runny nose.  For Eye Allergies:   For eye symptoms, wash pollen off the face and eyelids. Then apply cold wet compresses. Oral antihistamines will usually bring all eye symptoms under control.  Call Back If:  You become worse  Patient Refused Recommendation:  Patient Requests Prescription  See note, requesting Rx for cough

## 2013-10-08 NOTE — Telephone Encounter (Signed)
Called medication in to [facility], [facility], [facility].  Left patient message that prescription has been called in....   Gasper Sells, MA Student

## 2013-10-16 ENCOUNTER — Telehealth: Payer: Self-pay

## 2013-10-16 ENCOUNTER — Encounter: Payer: Self-pay | Admitting: Family Medicine

## 2013-10-16 NOTE — Telephone Encounter (Signed)
Study was actually read by Dr. Harl Bowie, not myself. On review of the study read by Dr. 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

## 2013-10-16 NOTE — Telephone Encounter (Signed)
Pt called and states she has not heard from her carotid, states she saw in mychart that the report went to dr Rockey Situ, and she has never seen dr Rockey Situ. States Dr Diona Browner was the one who ordered the test. Please call.

## 2013-10-16 NOTE — Telephone Encounter (Signed)
Spoke w/ pt.  Advised her that Dr. Rockey Situ is interpreting physician for her carotid and that he will be send his results back to Dr. Diona Browner. She is agreeable to this and states that she has not heard anything from Dr. Rometta Emery office and wants to make sure that results are sent back to her.

## 2013-10-17 NOTE — Telephone Encounter (Signed)
Reviewed results w/ pt.  She is agreeable to plan.

## 2013-10-28 ENCOUNTER — Other Ambulatory Visit: Payer: Self-pay | Admitting: Family Medicine

## 2013-10-28 NOTE — Telephone Encounter (Signed)
Last office visit 07/01/2013.  Ok to refill?

## 2013-10-29 NOTE — Telephone Encounter (Signed)
Called to [facility] as directed... Gasper Sells, MA Student.

## 2013-11-13 ENCOUNTER — Ambulatory Visit (INDEPENDENT_AMBULATORY_CARE_PROVIDER_SITE_OTHER): Payer: Medicare Other | Admitting: Podiatrist

## 2013-11-13 VITALS — BP 137/80 | HR 87 | Resp 16 | Ht 64.0 in | Wt 163.0 lb

## 2013-11-13 DIAGNOSIS — B351 Tinea unguium: Secondary | ICD-10-CM

## 2013-11-13 DIAGNOSIS — M79609 Pain in unspecified limb: Secondary | ICD-10-CM

## 2013-11-13 NOTE — Progress Notes (Signed)
HPI: Patient presents today for follow up of foot and nail care. She relates pain on the left second toe she relates she was concerned because it became red and swollen. She also relates that her swelling has completely resolved from the medication that she was recently taken off of.   Objective: Neurovascular status unchanged with palpable pedal pulses at 2/4 DP and PT bilateral. and neurological sensation intact and unchanged Patients nails are elongated, thickened, discolored, dystrophic with ingrown deformity present. Irritation of the proximal nail fold of the left second toe is present consistent with irritation from shoe gear. No redness, no streaking, no malodor , no drainage is present.   Assessment: Symptomatic mycotic toenails, irritation left second toe  Plan: Discussed treatment options and alternatives. Debrided nails without complication. A prescription for Keflex antibiotics in case the left second toe continues to worsen over the weekend. I did however trim it down and smoothed down well and I do not anticipate this will be a problem for her. She also previously had problems in the sinus tarsi region of bilateral feet however her shoes and inserts were adjusted and now she states that they feel great. She will be seen back in 3 months or as needed for followup.

## 2013-11-28 ENCOUNTER — Ambulatory Visit: Payer: Medicare Other | Admitting: Family Medicine

## 2013-12-11 ENCOUNTER — Ambulatory Visit: Payer: Medicare Other | Admitting: Podiatrist

## 2013-12-17 ENCOUNTER — Telehealth: Payer: Self-pay | Admitting: Family Medicine

## 2013-12-17 DIAGNOSIS — E042 Nontoxic multinodular goiter: Secondary | ICD-10-CM

## 2013-12-17 DIAGNOSIS — E7800 Pure hypercholesterolemia, unspecified: Secondary | ICD-10-CM

## 2013-12-17 NOTE — Telephone Encounter (Signed)
Message copied by Jinny Sanders on Wed Dec 17, 2013 11:57 PM ------      Message from: Ellamae Sia      Created: Wed Dec 17, 2013 11:50 AM      Regarding: Lab orders for        Patient is scheduled for CPX labs, please order future labs, Thanks , Terri       ------

## 2013-12-22 ENCOUNTER — Other Ambulatory Visit (INDEPENDENT_AMBULATORY_CARE_PROVIDER_SITE_OTHER): Payer: Medicare Other

## 2013-12-22 DIAGNOSIS — E7800 Pure hypercholesterolemia, unspecified: Secondary | ICD-10-CM

## 2013-12-22 LAB — COMPREHENSIVE METABOLIC PANEL
ALBUMIN: 4.2 g/dL (ref 3.5–5.2)
ALT: 24 U/L (ref 0–35)
AST: 18 U/L (ref 0–37)
Alkaline Phosphatase: 77 U/L (ref 39–117)
BUN: 20 mg/dL (ref 6–23)
CALCIUM: 9.4 mg/dL (ref 8.4–10.5)
CHLORIDE: 101 meq/L (ref 96–112)
CO2: 26 mEq/L (ref 19–32)
Creatinine, Ser: 0.7 mg/dL (ref 0.4–1.2)
GFR: 89.82 mL/min (ref >60.00)
GLUCOSE: 84 mg/dL (ref 70–99)
POTASSIUM: 4 meq/L (ref 3.5–5.1)
Sodium: 136 mEq/L (ref 135–145)
Total Bilirubin: 0.7 mg/dL (ref 0.3–1.2)
Total Protein: 7.4 g/dL (ref 6.0–8.3)

## 2013-12-22 LAB — LIPID PANEL
CHOL/HDL RATIO: 3
CHOLESTEROL: 192 mg/dL (ref 0–200)
HDL: 69.3 mg/dL (ref >39.00)
LDL Cholesterol: 88 mg/dL (ref 0–99)
TRIGLYCERIDES: 174 mg/dL — AB (ref 0.0–149.0)
VLDL: 34.8 mg/dL (ref 0.0–40.0)

## 2013-12-25 ENCOUNTER — Encounter: Payer: Self-pay | Admitting: Family Medicine

## 2013-12-25 ENCOUNTER — Ambulatory Visit: Payer: Medicare Other | Admitting: Family Medicine

## 2013-12-25 VITALS — BP 120/74 | HR 74 | Temp 97.6°F | Ht 64.0 in | Wt 169.0 lb

## 2013-12-25 DIAGNOSIS — I10 Essential (primary) hypertension: Secondary | ICD-10-CM

## 2013-12-25 DIAGNOSIS — I6529 Occlusion and stenosis of unspecified carotid artery: Secondary | ICD-10-CM

## 2013-12-25 DIAGNOSIS — E7800 Pure hypercholesterolemia, unspecified: Secondary | ICD-10-CM

## 2013-12-25 DIAGNOSIS — Z0000 Encounter for general adult medical examination without abnormal findings: Secondary | ICD-10-CM

## 2013-12-25 NOTE — Progress Notes (Signed)
I have personally reviewed the Medicare Annual Wellness questionnaire and have noted  1. The patient's medical and social history  2. Their use of alcohol, tobacco or illicit drugs  3. Their current medications and supplements  4. The patient's functional ability including ADL's, fall risks, home safety risks and hearing or visual  impairment.  5. Diet and physical activities  6. Evidence for depression or mood disorders  The patients weight, height, BMI and visual acuity have been recorded in the chart  I have made referrals, counseling and provided education to the patient based review of the above and I have provided the pt with a written personalized care plan for preventive services.   Hypertension:  Well controlled on current regimen., BP Readings from Last 3 Encounters:  12/25/13 120/74  11/13/13 137/80  09/11/13 143/80   Using medication without problems or lightheadedness: None  Chest pain with exertion:None  Edema: intermittent , but better than previously. Short of breath:None  Average home BPs: not checking lately.  Other issues:   Elevated Cholesterol: 4 tabs daily of red yeast rice.. LDL almost at goal <70.  Lab Results  Component Value Date   CHOL 192 12/22/2013   HDL 69.30 12/22/2013   LDLCALC 88 12/22/2013   LDLDIRECT 114.4 06/10/2013   TRIG 174.0* 12/22/2013   CHOLHDL 3 12/22/2013  SE to statins.  Using medications without problems:  Muscle aches: None  Diet compliance: orse lately over the winter  Exercise:[employer], weekly... Increasing as able  Other complaints: right carotid artery stenosis   Thyroid Nodules: Muliptle thyroid nodules, followed by ENDO. BX showed no malignancy. TSH stable recent check..   Continues to use valium regularly at bedtime. Sleeping better at night.  Using back brace as needed.  No falls, no sedation with this med.     Medication List       This list is accurate as of: 12/25/13 11:11 AM.  Always use your most recent med list.                aspirin 81 MG tablet  Take 81 mg by mouth daily.     celecoxib 200 MG capsule  Commonly known as:  CELEBREX  Take 1 capsule (200 mg total) by mouth 2 (two) times daily.     estradiol 0.05 MG/24HR patch  Commonly known as:  VIVELLE-DOT  Place 1 patch onto the skin 2 (two) times a week.     guaiFENesin-codeine 100-10 MG/5ML syrup  Commonly known as:  ROBITUSSIN AC  Take 5-10 mLs by mouth at bedtime as needed for cough.     loratadine 10 MG tablet  Commonly known as:  CLARITIN  Take 10 mg by mouth daily.     NORVASC 10 MG tablet  Generic drug:  amLODipine  Take 0.5 tablets (5 mg total) by mouth daily.     pantoprazole 40 MG tablet  Commonly known as:  PROTONIX  Take 1 tablet (40 mg total) by mouth 2 (two) times daily.     PHYTOBASE Crea  Apply topically daily.     Red Yeast Rice 600 MG Caps  Take 4 capsules by mouth daily.     VALIUM 2 MG tablet  Generic drug:  diazepam  TAKE 1/2 TO 1 TABLET BY MOUTH EVERY 12 HOURS AS NEEDED FOR ANXIETY     valsartan 80 MG tablet  Commonly known as:  DIOVAN  Take 1 tablet (80 mg total) by mouth daily.  Review of Systems  Constitutional: Negative for fever and fatigue.  HENT: Negative for ear pain.  Eyes: Negative for pain.  Respiratory: Negative for chest tightness and shortness of breath.  Cardiovascular: Negative for chest pain, palpitations and leg swelling.  Gastrointestinal: Negative for abdominal pain.  Genitourinary: Negative for dysuria.  Musculoskeletal: Negative for back pain.  Objective:   Physical Exam  Constitutional: Vital signs are normal. She appears well-developed and well-nourished. She is cooperative. Non-toxic appearance. She does not appear ill. No distress.  HENT:  Head: Normocephalic.  Right Ear: Hearing, tympanic membrane, external ear and ear canal normal.  Left Ear: Hearing, tympanic membrane, external ear and ear canal normal.  Nose: Nose normal.  Eyes: Conjunctivae, EOM and  lids are normal. Pupils are equal, round, and reactive to light. No foreign bodies found.  Neck: Trachea normal and normal range of motion. Neck supple. Carotid bruit is not present. No mass and no thyromegaly present.  Cardiovascular: Normal rate, regular rhythm, S1 normal, S2 normal, normal heart sounds and intact distal pulses. Exam reveals no gallop.  No murmur heard.  Breast Exam: per GYN.  Pulmonary/Chest: Effort normal and breath sounds normal. No respiratory distress. She has no wheezes. She has no rhonchi. She has no rales.  Abdominal: Soft. Normal appearance and bowel sounds are normal. She exhibits no distension, no fluid wave, no abdominal bruit and no mass. There is no hepatosplenomegaly. There is no tenderness. There is no rebound, no guarding and no CVA tenderness. No hernia.  Lymphadenopathy:  She has no cervical adenopathy.  She has no axillary adenopathy.  Neurological: She is alert. She has normal strength. No cranial nerve deficit or sensory deficit.  Skin: Skin is warm, dry and intact. No rash noted.  Psychiatric: Her speech is normal and behavior is normal. Judgment normal. Her mood appears not anxious. Cognition and memory are normal. She does not exhibit a depressed mood.  Assessment & Plan:   AMW: The patient's preventative maintenance and recommended screening tests for an annual wellness exam were reviewed in full today.  Brought up to date unless services declined.  Counselled on the importance of diet, exercise, and its role in overall health and mortality.  The patient's FH and SH was reviewed, including their home life, tobacco status, and drug and alcohol status.   Vaccines: Uptodate with shingles, Td and PNA, she will consider prevnar in future. DEXA: osteopenia 06/2009, due.. Not interested.  Mammogram: Dr. Kenton Kingfisher GYN does breast exam, refuses mammograms  Colon: Dr. Carlean Purl.. nml 2012,she is considering virtual colonoscopy instead at repeat. Mother with colon  cancer.  DVE/PAP:  Sees GYN Dr. Kenton Kingfisher, no pap indicated, no risk factors for ovarian/uterine cancer.. No further DVE.

## 2013-12-25 NOTE — Assessment & Plan Note (Signed)
Well controlled. Continue current medication.  

## 2013-12-25 NOTE — Assessment & Plan Note (Signed)
Almost at goal < 70.

## 2013-12-25 NOTE — Patient Instructions (Addendum)
Continue working on healthy eating and regular exercise.  Follow up in 6 months with labs prior chol , HTN. Stop at front desk to schedule repeat US to evaluate carotid dopplers.

## 2013-12-26 ENCOUNTER — Telehealth: Payer: Self-pay | Admitting: Family Medicine

## 2013-12-26 NOTE — Telephone Encounter (Signed)
Relevant patient education assigned to patient using Emmi. ° °

## 2013-12-30 ENCOUNTER — Other Ambulatory Visit: Payer: Self-pay | Admitting: Family Medicine

## 2013-12-30 NOTE — Telephone Encounter (Signed)
Last office visit 12/25/2013.  Ok to refill?

## 2013-12-30 NOTE — Telephone Encounter (Signed)
Called to [employer].

## 2014-01-22 ENCOUNTER — Encounter: Payer: Self-pay | Admitting: Internal Medicine

## 2014-01-22 ENCOUNTER — Ambulatory Visit (INDEPENDENT_AMBULATORY_CARE_PROVIDER_SITE_OTHER): Payer: Medicare Other | Admitting: Internal Medicine

## 2014-01-22 VITALS — BP 136/82 | HR 75 | Ht 64.0 in | Wt 170.8 lb

## 2014-01-22 DIAGNOSIS — H1045 Other chronic allergic conjunctivitis: Secondary | ICD-10-CM

## 2014-01-22 DIAGNOSIS — J302 Other seasonal allergic rhinitis: Secondary | ICD-10-CM

## 2014-01-22 DIAGNOSIS — G4733 Obstructive sleep apnea (adult) (pediatric): Secondary | ICD-10-CM

## 2014-01-22 DIAGNOSIS — J309 Allergic rhinitis, unspecified: Secondary | ICD-10-CM

## 2014-01-22 DIAGNOSIS — I6529 Occlusion and stenosis of unspecified carotid artery: Secondary | ICD-10-CM

## 2014-01-22 DIAGNOSIS — J3089 Other allergic rhinitis: Secondary | ICD-10-CM

## 2014-01-22 NOTE — Patient Instructions (Signed)
We can continue CPAP Autotitration   Ok to use Claritin as needed  Please call as needed

## 2014-01-22 NOTE — Progress Notes (Signed)
08/08/11-73 year old female never smoker followed for obstructive sleep apnea, allergic rhinitis LOV-08/09/10 She plans to get flu shot today at the drugstore. Continues daily use of CPAP 9/[employer] all night every night. Has lost 20 pounds with [employer] and would like to have her pressure reduced to 8. Being treated medically for her degenerative disc disease and back pain without surgery planned. Nasal congestion has not been a problem.  12/31/12- 73 year old female never smoker followed for obstructive sleep apnea, allergic rhinitis FOLLOWS FOR: now uses Elite [employer] machine now; wears CPAP every night for about 6-8 hours and states since new machine she is doing great(sleeps deeper and feels more alert during the day. This machine adjusts to what pressure she needs. CPAP/AutoPap/[employer] well tolerated with good control. No longer snoring. Minimal rhinitis symptoms this pollen season. Has not needed medications. Concerned about bilateral ankle edema and we discussed her calcium channel blocker.  01/22/14- 73 year old female never smoker followed for obstructive sleep apnea, allergic rhinitis/conjunctivitis FOLLOWS FOR: Wears CPAP auto/[employer] every night for about 7 hours; pressure works well for patient.  Claritin has been sufficient for watery eyes  ROS-see HPI Constitutional:   No-   weight loss, night sweats, fevers, chills, fatigue, lassitude. HEENT:   No-  headaches, difficulty swallowing, tooth/dental problems, sore throat,       No-  sneezing, itching, ear ache, nasal congestion, post nasal drip,  CV:  No-   chest pain, orthopnea, PND, +swelling in lower extremities, anasarca,                                  dizziness, palpitations Resp: No-   shortness of breath with exertion or at rest.              No-   productive cough,  No non-productive cough,  No- coughing up of blood.              No-   change in color of mucus.  No- wheezing.   Skin: No-   rash or lesions. GI:  No-    heartburn, indigestion, abdominal pain, nausea, vomiting, GU: . MS:  No-   joint pain or swelling.  No- decreased range of motion.  No- back pain. Neuro-     nothing unusual Psych:  No- change in mood or affect. No depression or anxiety.  No memory loss.   OBJ General- Alert, Oriented, Affect-appropriate, Distress- none acute; looks well Skin- rash-none, lesions- none, excoriation- none Lymphadenopathy- none Head- atraumatic            Eyes- Gross vision intact, PERRLA, conjunctivae +minor capillary injection            Ears- Hearing, canals-normal            Nose- Clear, no-Septal dev, mucus, polyps, erosion, perforation             Throat- Mallampati II-III , mucosa clear , drainage- none, tonsils- atrophic Neck- flexible , trachea midline, no stridor , thyroid nl, carotid no bruit Chest - symmetrical excursion , unlabored           Heart/CV- RRR , no murmur , no gallop  , no rub, nl s1 s2                           - JVD- none , edema-+1 bilateral ankle, stasis changes- none, varices- none  Lung- clear to P&A, wheeze- none, cough- none , dullness-none, rub- none           Chest wall-  Abd-  Br/ Gen/ Rectal- Not done, not indicated Extrem- cyanosis- none, clubbing, none, atrophy- none, strength- nl Neuro- grossly intact to observation       

## 2014-01-28 ENCOUNTER — Encounter: Payer: Self-pay | Admitting: Family Medicine

## 2014-02-12 ENCOUNTER — Ambulatory Visit (INDEPENDENT_AMBULATORY_CARE_PROVIDER_SITE_OTHER): Payer: Medicare Other | Admitting: Podiatrist

## 2014-02-12 VITALS — BP 138/73 | HR 73 | Resp 16

## 2014-02-12 DIAGNOSIS — M79609 Pain in unspecified limb: Secondary | ICD-10-CM

## 2014-02-12 DIAGNOSIS — B351 Tinea unguium: Secondary | ICD-10-CM

## 2014-02-12 DIAGNOSIS — M216X9 Other acquired deformities of unspecified foot: Secondary | ICD-10-CM

## 2014-02-16 NOTE — Progress Notes (Signed)
HPI: Patient presents today for follow up of foot and nail care. She denies any medical history changes.  Relates her foot and toe swelling has improved.  Objective: Neurovascular status unchanged with palpable pedal pulses at 2/4 DP and PT bilateral. and neurological sensation intact and unchanged Patients nails are elongated, thickened, discolored, dystrophic with ingrown deformity present. Irritation of the proximal nail fold of the left second toe is present consistent with irritation from shoe gear. No redness, no streaking, no malodor , no drainage is present.  Assessment: Symptomatic mycotic toenails, irritation left second toe  Plan: Discussed treatment options and alternatives. Debrided nails without complication. A prescription for Keflex antibiotics in case the left second toe continues to worsen over the weekend. I did however trim it down and smoothed down well and I do not anticipate this will be a problem for her. She also previously had problems in the sinus tarsi region of bilateral feet however her shoes and inserts were adjusted and now she states that they feel great. She will be seen back in 3 months or as needed for followup.

## 2014-03-04 ENCOUNTER — Other Ambulatory Visit: Payer: Self-pay | Admitting: Family Medicine

## 2014-03-04 NOTE — Telephone Encounter (Signed)
Last office visit 12/25/2013.  Last refilled 12/30/2013 for #60.  Ok to refill?

## 2014-03-05 NOTE — Telephone Encounter (Signed)
Called to [facility].

## 2014-03-15 DIAGNOSIS — H1045 Other chronic allergic conjunctivitis: Secondary | ICD-10-CM | POA: Insufficient documentation

## 2014-03-15 NOTE — Assessment & Plan Note (Signed)
Claritin is usually sufficient. We discussed seasonal relationship to her rhinitis. Discussed potential for dry eyes.

## 2014-03-15 NOTE — Assessment & Plan Note (Signed)
Claritin has been sufficient

## 2014-03-15 NOTE — Assessment & Plan Note (Signed)
Good compliance and control. We discussed comfort measures. No changes indicated.

## 2014-03-26 ENCOUNTER — Encounter (INDEPENDENT_AMBULATORY_CARE_PROVIDER_SITE_OTHER): Payer: Medicare Other

## 2014-03-26 DIAGNOSIS — I6529 Occlusion and stenosis of unspecified carotid artery: Secondary | ICD-10-CM

## 2014-03-30 ENCOUNTER — Other Ambulatory Visit: Payer: Self-pay | Admitting: Family Medicine

## 2014-04-20 ENCOUNTER — Other Ambulatory Visit: Payer: Self-pay | Admitting: Family Medicine

## 2014-05-04 ENCOUNTER — Other Ambulatory Visit: Payer: Self-pay | Admitting: Family Medicine

## 2014-05-04 NOTE — Telephone Encounter (Signed)
Last office visit 12/25/2013.  Valium last refilled 03/05/2014 for #60 with no refills.  Celebrex last refilled 09/08/2013 for #180 with 1 refill.  Ok to refill?

## 2014-05-05 NOTE — Telephone Encounter (Signed)
Valium called to [facility] [address] [place]. 

## 2014-05-08 ENCOUNTER — Ambulatory Visit: Payer: Medicare Other | Admitting: Family Medicine

## 2014-05-14 ENCOUNTER — Ambulatory Visit: Payer: Medicare Other | Admitting: Podiatrist

## 2014-05-14 ENCOUNTER — Ambulatory Visit (INDEPENDENT_AMBULATORY_CARE_PROVIDER_SITE_OTHER): Payer: Medicare Other | Admitting: Podiatrist

## 2014-05-14 DIAGNOSIS — M79609 Pain in unspecified limb: Secondary | ICD-10-CM

## 2014-05-14 DIAGNOSIS — B351 Tinea unguium: Secondary | ICD-10-CM

## 2014-05-14 DIAGNOSIS — M79676 Pain in unspecified toe(s): Principal | ICD-10-CM

## 2014-05-14 NOTE — Progress Notes (Signed)
HPI: Patient presents today for follow up of foot and nail care. She denies any medical history changes. Relates her foot and toe swelling has improved.   Objective: Neurovascular status unchanged with palpable pedal pulses at 2/4 DP and PT bilateral. and neurological sensation intact and unchanged Patients nails are elongated, thickened, discolored, dystrophic with ingrown deformity present.   Assessment: Symptomatic mycotic toenails,  Plan: Discussed treatment options and alternatives. Debrided nails without complication. She will be seen back in 3 months or as needed for followup.

## 2014-06-08 ENCOUNTER — Telehealth: Payer: Self-pay | Admitting: Family Medicine

## 2014-06-08 DIAGNOSIS — E7800 Pure hypercholesterolemia, unspecified: Secondary | ICD-10-CM

## 2014-06-08 NOTE — Telephone Encounter (Signed)
Message copied by Jinny Sanders on Mon Jun 08, 2014 10:46 PM ------      Message from: Ellamae Sia      Created: Wed Jun 03, 2014  5:32 PM      Regarding: Lab orders for Tuesday, 10.6.15       Lab orders for a 6 month f/u ------

## 2014-06-09 ENCOUNTER — Other Ambulatory Visit (INDEPENDENT_AMBULATORY_CARE_PROVIDER_SITE_OTHER): Payer: Medicare Other

## 2014-06-09 DIAGNOSIS — E7800 Pure hypercholesterolemia, unspecified: Secondary | ICD-10-CM

## 2014-06-09 DIAGNOSIS — I10 Essential (primary) hypertension: Secondary | ICD-10-CM

## 2014-06-09 LAB — COMPREHENSIVE METABOLIC PANEL
ALT: 21 U/L (ref 0–35)
AST: 22 U/L (ref 0–37)
Albumin: 4.5 g/dL (ref 3.5–5.2)
Alkaline Phosphatase: 70 U/L (ref 39–117)
BUN: 21 mg/dL (ref 6–23)
CALCIUM: 9.2 mg/dL (ref 8.4–10.5)
CHLORIDE: 104 meq/L (ref 96–112)
CO2: 27 meq/L (ref 19–32)
Creatinine, Ser: 0.9 mg/dL (ref 0.4–1.2)
GFR: 61.73 mL/min (ref >60.00)
GLUCOSE: 101 mg/dL — AB (ref 70–99)
POTASSIUM: 3.9 meq/L (ref 3.5–5.1)
SODIUM: 138 meq/L (ref 135–145)
TOTAL PROTEIN: 7.9 g/dL (ref 6.0–8.3)
Total Bilirubin: 0.9 mg/dL (ref 0.2–1.2)

## 2014-06-09 LAB — LIPID PANEL
CHOLESTEROL: 212 mg/dL — AB (ref 0–200)
HDL: 61 mg/dL (ref >39.00)
LDL CALC: 123 mg/dL — AB (ref 0–99)
NonHDL: 151
TRIGLYCERIDES: 141 mg/dL (ref 0.0–149.0)
Total CHOL/HDL Ratio: 3
VLDL: 28.2 mg/dL (ref 0.0–40.0)

## 2014-06-15 ENCOUNTER — Other Ambulatory Visit: Payer: Self-pay | Admitting: Family Medicine

## 2014-06-15 NOTE — Telephone Encounter (Signed)
Last office visit 12/25/2013.  Last Refilled 05/05/2014 for #60 with no refills. Patient has appointment tomorrow 06/16/2014.  Ok to refill?

## 2014-06-15 NOTE — Telephone Encounter (Signed)
Called to [facility] [address] [place]. 

## 2014-06-16 ENCOUNTER — Ambulatory Visit (INDEPENDENT_AMBULATORY_CARE_PROVIDER_SITE_OTHER): Payer: Medicare Other | Admitting: Family Medicine

## 2014-06-16 ENCOUNTER — Encounter: Payer: Self-pay | Admitting: Family Medicine

## 2014-06-16 VITALS — BP 138/68 | HR 92 | Temp 98.0°F | Ht 64.0 in | Wt 165.2 lb

## 2014-06-16 DIAGNOSIS — I10 Essential (primary) hypertension: Secondary | ICD-10-CM

## 2014-06-16 DIAGNOSIS — E7800 Pure hypercholesterolemia, unspecified: Secondary | ICD-10-CM

## 2014-06-16 DIAGNOSIS — I6529 Occlusion and stenosis of unspecified carotid artery: Secondary | ICD-10-CM

## 2014-06-16 NOTE — Progress Notes (Signed)
Pre visit review using our clinic review tool, if applicable. No additional management support is needed unless otherwise documented below in the visit note. 

## 2014-06-16 NOTE — Assessment & Plan Note (Signed)
Inadequate control. On red yeast rice, refuses other statins. Encouraged exercise, weight loss, healthy eating habits.

## 2014-06-16 NOTE — Progress Notes (Signed)
Subjective:    Patient ID: [name] [name], female    DOB: [birth year], 73 y.o.   MRN: [id number]   HPI 73 year old female presents for 6 month follow up.  She is very sad today given had to put horse to sleep yesterday, burial today.  Hypertension: Well controlled on current regimen.,  BP Readings from Last 3 Encounters:  06/16/14 138/68  02/12/14 138/73  01/22/14 136/82  Using medication without problems or lightheadedness: None  Chest pain with exertion:None  Edema: intermittent , but better than previously.  Short of breath:None  Average home BPs: not checking lately.  Other issues:   Elevated Cholesterol: 4 tabs daily of red yeast rice.Marland Kitchen LDL NOT at goal <70.  Lab Results  Component Value Date   CHOL 212* 06/09/2014   HDL 61.00 06/09/2014   LDLCALC 123* 06/09/2014   LDLDIRECT 114.4 06/10/2013   TRIG 141.0 06/09/2014   CHOLHDL 3 06/09/2014  SE to statins.  Using medications without problems:  Muscle aches: None  Diet compliance: She has not been eating well. Exercise:not going  L[facility] to [facility] Other complaints: right carotid artery stenosis   Thyroid Nodules: Muliptle thyroid nodules, followed by ENDO. BX showed no malignancy.  TSH stable recent check..   Continues to use valium regularly at bedtime. Sleeping better at night.  Using back brace as needed.  No falls, no sedation with this med.    Review of Systems  Constitutional: Negative for fever and fatigue.  HENT: Negative for ear pain.   Eyes: Negative for pain.  Respiratory: Negative for chest tightness and shortness of breath.   Cardiovascular: Negative for chest pain, palpitations and leg swelling.  Gastrointestinal: Negative for abdominal pain.  Genitourinary: Negative for dysuria.       Objective:   Physical Exam  Constitutional: Vital signs are normal. She appears well-developed and well-nourished. She is cooperative.  Non-toxic appearance. She does not appear ill. No distress.  HENT:  Head:  Normocephalic.  Right Ear: Hearing, tympanic membrane, external ear and ear canal normal. Tympanic membrane is not erythematous, not retracted and not bulging.  Left Ear: Hearing, tympanic membrane, external ear and ear canal normal. Tympanic membrane is not erythematous, not retracted and not bulging.  Nose: No mucosal edema or rhinorrhea. Right sinus exhibits no maxillary sinus tenderness and no frontal sinus tenderness. Left sinus exhibits no maxillary sinus tenderness and no frontal sinus tenderness.  Mouth/Throat: Uvula is midline, oropharynx is clear and moist and mucous membranes are normal.  Eyes: Conjunctivae, EOM and lids are normal. Pupils are equal, round, and reactive to light. Lids are everted and swept, no foreign bodies found.  Neck: Trachea normal and normal range of motion. Neck supple. Carotid bruit is not present. No mass and no thyromegaly present.  Cardiovascular: Normal rate, regular rhythm, S1 normal, S2 normal, normal heart sounds, intact distal pulses and normal pulses.  Exam reveals no gallop and no friction rub.   No murmur heard. Pulmonary/Chest: Effort normal and breath sounds normal. Not tachypneic. No respiratory distress. She has no decreased breath sounds. She has no wheezes. She has no rhonchi. She has no rales.  Abdominal: Soft. Normal appearance and bowel sounds are normal. There is no tenderness.  Neurological: She is alert.  Skin: Skin is warm, dry and intact. No rash noted.  Psychiatric: Her speech is normal and behavior is normal. Judgment and thought content normal. Her mood appears not anxious. Cognition and memory are normal. She does not exhibit a depressed  mood.          Assessment & Plan:

## 2014-06-16 NOTE — Patient Instructions (Addendum)
Get back on track with low fat and cholesterol diet.  Restart exercise as able.  Fat and Cholesterol Control Diet Fat and cholesterol levels in your blood and organs are influenced by your diet. High levels of fat and cholesterol may lead to diseases of the heart, small and large blood vessels, gallbladder, liver, and pancreas. CONTROLLING FAT AND CHOLESTEROL WITH DIET Although exercise and lifestyle factors are important, your diet is key. That is because certain foods are known to raise cholesterol and others to lower it. The goal is to balance foods for their effect on cholesterol and more importantly, to replace saturated and trans fat with other types of fat, such as monounsaturated fat, polyunsaturated fat, and omega-3 fatty acids. On average, a person should consume no more than 15 to 17 g of saturated fat daily. Saturated and trans fats are considered "bad" fats, and they will raise LDL cholesterol. Saturated fats are primarily found in animal products such as meats, butter, and cream. However, that does not mean you need to give up all your favorite foods. Today, there are good tasting, low-fat, low-cholesterol substitutes for most of the things you like to eat. Choose low-fat or nonfat alternatives. Choose round or loin cuts of red meat. These types of cuts are lowest in fat and cholesterol. Chicken (without the skin), fish, veal, and ground [place] breast are great choices. Eliminate fatty meats, such as hot dogs and salami. Even shellfish have little or no saturated fat. Have a 3 oz (85 g) portion when you eat lean meat, poultry, or fish. Trans fats are also called "partially hydrogenated oils." They are oils that have been scientifically manipulated so that they are solid at room temperature resulting in a longer shelf life and improved taste and texture of foods in which they are added. Trans fats are found in stick margarine, some tub margarines, cookies, crackers, and baked goods.  When  baking and cooking, oils are a great substitute for butter. The monounsaturated oils are especially beneficial since it is believed they lower LDL and raise HDL. The oils you should avoid entirely are saturated tropical oils, such as coconut and palm.  Remember to eat a lot from food groups that are naturally free of saturated and trans fat, including fish, fruit, vegetables, beans, grains (barley, rice, couscous, bulgur wheat), and pasta (without cream sauces).  IDENTIFYING FOODS THAT LOWER FAT AND CHOLESTEROL  Soluble fiber may lower your cholesterol. This type of fiber is found in fruits such as apples, vegetables such as broccoli, potatoes, and carrots, legumes such as beans, peas, and lentils, and grains such as barley. Foods fortified with plant sterols (phytosterol) may also lower cholesterol. You should eat at least 2 g per day of these foods for a cholesterol lowering effect.  Read package labels to identify low-saturated fats, trans fat free, and low-fat foods at the supermarket. Select cheeses that have only 2 to 3 g saturated fat per ounce. Use a heart-healthy tub margarine that is free of trans fats or partially hydrogenated oil. When buying baked goods (cookies, crackers), avoid partially hydrogenated oils. Breads and muffins should be made from whole grains (whole-wheat or whole oat flour, instead of "flour" or "enriched flour"). Buy non-creamy canned soups with reduced salt and no added fats.  FOOD PREPARATION TECHNIQUES  Never deep-fry. If you must fry, either stir-fry, which uses very little fat, or use non-stick cooking sprays. When possible, broil, bake, or roast meats, and steam vegetables. Instead of putting butter or margarine  on vegetables, use lemon and herbs, applesauce, and cinnamon (for squash and sweet potatoes). Use nonfat yogurt, salsa, and low-fat dressings for salads.  LOW-SATURATED FAT / LOW-FAT FOOD SUBSTITUTES Meats / Saturated Fat (g)  Avoid: Steak, marbled (3 oz/85 g)  / 11 g  Choose: Steak, lean (3 oz/85 g) / 4 g  Avoid: Hamburger (3 oz/85 g) / 7 g  Choose: Hamburger, lean (3 oz/85 g) / 5 g  Avoid: Ham (3 oz/85 g) / 6 g  Choose: Ham, lean cut (3 oz/85 g) / 2.4 g  Avoid: Chicken, with skin, dark meat (3 oz/85 g) / 4 g  Choose: Chicken, skin removed, dark meat (3 oz/85 g) / 2 g  Avoid: Chicken, with skin, light meat (3 oz/85 g) / 2.5 g  Choose: Chicken, skin removed, light meat (3 oz/85 g) / 1 g Dairy / Saturated Fat (g)  Avoid: Whole milk (1 cup) / 5 g  Choose: Low-fat milk, 2% (1 cup) / 3 g  Choose: Low-fat milk, 1% (1 cup) / 1.5 g  Choose: Skim milk (1 cup) / 0.3 g  Avoid: Hard cheese (1 oz/28 g) / 6 g  Choose: Skim milk cheese (1 oz/28 g) / 2 to 3 g  Avoid: Cottage cheese, 4% fat (1 cup) / 6.5 g  Choose: Low-fat cottage cheese, 1% fat (1 cup) / 1.5 g  Avoid: Ice cream (1 cup) / 9 g  Choose: Sherbet (1 cup) / 2.5 g  Choose: Nonfat frozen yogurt (1 cup) / 0.3 g  Choose: Frozen fruit bar / trace  Avoid: Whipped cream (1 tbs) / 3.5 g  Choose: Nondairy whipped topping (1 tbs) / 1 g Condiments / Saturated Fat (g)  Avoid: Mayonnaise (1 tbs) / 2 g  Choose: Low-fat mayonnaise (1 tbs) / 1 g  Avoid: Butter (1 tbs) / 7 g  Choose: Extra light margarine (1 tbs) / 1 g  Avoid: Coconut oil (1 tbs) / 11.8 g  Choose: Olive oil (1 tbs) / 1.8 g  Choose: Corn oil (1 tbs) / 1.7 g  Choose: Safflower oil (1 tbs) / 1.2 g  Choose: Sunflower oil (1 tbs) / 1.4 g  Choose: Soybean oil (1 tbs) / 2.4 g  Choose: Canola oil (1 tbs) / 1 g Document Released: 08/21/2005 Document Revised: 12/16/2012 Document Reviewed: 11/19/2013 ExitCare Patient Information 2015 [place], [place]. This information is not intended to replace advice given to you by your health care provider. Make sure you discuss any questions you have with your health care provider.

## 2014-06-16 NOTE — Assessment & Plan Note (Signed)
Well controlled. Continue current medication.  

## 2014-07-24 ENCOUNTER — Ambulatory Visit (INDEPENDENT_AMBULATORY_CARE_PROVIDER_SITE_OTHER): Payer: Medicare Other

## 2014-07-24 DIAGNOSIS — Z23 Encounter for immunization: Secondary | ICD-10-CM

## 2014-07-26 ENCOUNTER — Other Ambulatory Visit: Payer: Self-pay | Admitting: Family Medicine

## 2014-07-26 NOTE — Telephone Encounter (Signed)
Last office visit 06/16/2014.  Last refilled 06/15/2014 for #60 with no refills.  Ok to refill?

## 2014-07-27 NOTE — Telephone Encounter (Signed)
Called to [facility] [address] [place]. 

## 2014-08-13 ENCOUNTER — Ambulatory Visit: Payer: Medicare Other | Admitting: Podiatry

## 2014-08-24 ENCOUNTER — Other Ambulatory Visit: Payer: Self-pay | Admitting: Family Medicine

## 2014-08-24 NOTE — Telephone Encounter (Signed)
Last office visit 06/16/2014.  Valium last refilled 07/26/2014 for #60 with no refills.  Ok to refill?

## 2014-08-25 ENCOUNTER — Encounter: Payer: Self-pay | Admitting: Podiatry

## 2014-08-25 ENCOUNTER — Ambulatory Visit (INDEPENDENT_AMBULATORY_CARE_PROVIDER_SITE_OTHER): Payer: Medicare Other | Admitting: Podiatry

## 2014-08-25 VITALS — BP 157/80 | HR 86 | Resp 18

## 2014-08-25 DIAGNOSIS — M79676 Pain in unspecified toe(s): Secondary | ICD-10-CM

## 2014-08-25 DIAGNOSIS — L600 Ingrowing nail: Secondary | ICD-10-CM

## 2014-08-25 DIAGNOSIS — B351 Tinea unguium: Secondary | ICD-10-CM

## 2014-08-25 NOTE — Telephone Encounter (Signed)
Valium called to [facility] [address]

## 2014-08-26 ENCOUNTER — Ambulatory Visit (INDEPENDENT_AMBULATORY_CARE_PROVIDER_SITE_OTHER): Payer: Medicare Other | Admitting: Podiatry

## 2014-08-26 VITALS — BP 160/89 | HR 88 | Resp 16

## 2014-08-26 DIAGNOSIS — B351 Tinea unguium: Secondary | ICD-10-CM

## 2014-08-26 DIAGNOSIS — M79676 Pain in unspecified toe(s): Secondary | ICD-10-CM

## 2014-08-26 NOTE — Progress Notes (Signed)
She presents today concerned about a hallux right. Where she had her toenails trimmed yesterday in the corner was cut. She had mild bleeding in the toe is mildly tender. She denies fever chills nausea vomiting muscle aches and pains.  Objective: The hallux right does not demonstrate any erythema A lightest drainage or odor. Margins appear to be clean and without injury with the exception of a small nick to the skin fibular border hallux right. No signs of infection.  Assessment: Small laceration into the skin no complications.  Plan: Discussed with the patient regarding soaking twice daily Epsom salts and water and to completely resolved and no longer tender. Cover with a Band-Aid in the day if necessary. Follow-up with me for routine nail debridement.

## 2014-08-31 ENCOUNTER — Ambulatory Visit (INDEPENDENT_AMBULATORY_CARE_PROVIDER_SITE_OTHER): Payer: Medicare Other | Admitting: Internal Medicine

## 2014-08-31 ENCOUNTER — Telehealth: Payer: Self-pay | Admitting: *Deleted

## 2014-08-31 ENCOUNTER — Encounter: Payer: Self-pay | Admitting: Internal Medicine

## 2014-08-31 VITALS — BP 150/80 | HR 82 | Temp 98.1°F | Wt 168.0 lb

## 2014-08-31 DIAGNOSIS — I7300 Raynaud's syndrome without gangrene: Secondary | ICD-10-CM

## 2014-08-31 DIAGNOSIS — I6529 Occlusion and stenosis of unspecified carotid artery: Secondary | ICD-10-CM

## 2014-08-31 DIAGNOSIS — I10 Essential (primary) hypertension: Secondary | ICD-10-CM

## 2014-08-31 DIAGNOSIS — M159 Polyosteoarthritis, unspecified: Secondary | ICD-10-CM

## 2014-08-31 MED ORDER — VALSARTAN 160 MG PO TABS: 160.0000 mg | ORAL_TABLET | Freq: Every day | ORAL | Status: DC

## 2014-08-31 NOTE — Assessment & Plan Note (Signed)
BP Readings from Last 3 Encounters:  08/31/14 150/80  08/26/14 160/89  08/25/14 157/80   Repeat BP supine 16/88 (HR 84) and standing 164/78 (HR 86) Clearly not orthostatic Her mild headache and dizziness could be from symptoms from her mild elevated BP Will try increased dose of her diovan--up to 160mg  daily and assess how she does

## 2014-08-31 NOTE — Telephone Encounter (Signed)
PLEASE NOTE: All timestamps contained within this report are represented as [place] Standard Time. CONFIDENTIALTY NOTICE: This fax transmission is intended only for the addressee. It contains information that is legally privileged, confidential or otherwise protected from use or disclosure. If you are not the intended recipient, you are strictly prohibited from reviewing, disclosing, copying using or disseminating any of this information or taking any action in reliance on or regarding this information. If you have received this fax in error, please notify us immediately by telephone so that we can arrange for its return to [place]. Phone: [phone], Toll-Free: [phone], Fax: [phone] Page: 1 of 2 Call Id: [id number] [place] Patient Name: [name] [name] Gender: Female DOB: 06/24/1981 Age: 73 Y 2 M 8 D Return Phone Number: [phone] (Primary) [place] [address] Client [facility] - Client Client Site [place], [place] Type Call Call Type Triage / Clinical Relationship To Patient Self Return Phone Number [phone] (Primary) Chief Complaint NUMBNESS - sudden on one side of face or body Initial Comment [name] states she has right side back, arm and neck pain and numbness, xfer from office, has 3:45 appt with Dr Tomasita Morrow, unless she needs to be seen sooner [employer] Not Listed Patient would like to be seen in office PreDisposition Call Doctor Nurse Assessment Nurse: Shawn Stall, RN, Trish Date/Time (Eastern Time): 08/31/2014 8:44:33 AM Confirm and document reason for call. If symptomatic, describe symptoms. ---Patient is calling for self and caller states she has right side back, arm and neck pain and numbness, xfer from office, has 3:45 appt with Dr Tomasita Morrow, unless she needs to be  seen sooner. She has been to the Chiropractor 2 times. No accident just woke up one day and the pain was there. Numbness is in arm and fingers. This has been going on since the 08/24/14. Patient is using Ice pack [employer]. Has the patient traveled out of the country within the last 30 days? ---No Does the patient require triage? ---Yes Related visit to physician within the last 2 weeks? ---Yes Does the PT have any chronic conditions? (i.e. diabetes, asthma, etc.) ---Yes List chronic conditions. ---chiropractor 2 times since the 21st. Did the patient indicate they were pregnant? ---No Guidelines Guideline Title Affirmed Question Affirmed Notes Nurse Date/Time (Eastern Time) Neurologic Deficit Bell's palsy suspected (i.e., weakness on only one side of the face, developing over hours to days, no other symptoms) Shawn Stall, RN, Trish 08/31/2014 8:48:51 AM Disp. Time Eilene Ghazi Time) Disposition Final User 08/31/2014 8:43:38 AM Send to Urgent Queue Gwenlyn Perking NOTE: All timestamps contained within this report are represented as [place] Standard Time. CONFIDENTIALTY NOTICE: This fax transmission is intended only for the addressee. It contains information that is legally privileged, confidential or otherwise protected from use or disclosure. If you are not the intended recipient, you are strictly prohibited from reviewing, disclosing, copying using or disseminating any of this information or taking any action in reliance on or regarding this information. If you have received this fax in error, please notify us immediately by telephone so that we can arrange for its return to [place]. Phone: [phone], Toll-Free: [phone], Fax: [phone] Page: 2 of 2 Call Id: [id number] [place]. Time Eilene Ghazi Time) Disposition Final User 08/31/2014 9:02:53 AM Clinical Call Shawn Stall, RN, [place] 08/31/2014 8:52:55 AM Go to ED Now (or PCP triage) Yes Shawn Stall, RN, [place] Sender  [name] Understands:  Yes Disagree/Comply: Comply Care Advice Given Per Guideline GO TO ED NOW (OR PCP TRIAGE): CARE ADVICE given per Neurologic Deficit (Adult) guideline. After Care Instructions Given Call Event Type User Date / Time Description Comments User: Marcy Salvo, RN Date/Time Eilene Ghazi Time): 08/31/2014 9:02:12 AM called office to let them know of triage results as client directives states. Spoke with Genelle Bal and she is going to get back with patient after speaking to Doctor. Referrals REFERRED TO PCP OFFICE

## 2014-08-31 NOTE — Assessment & Plan Note (Signed)
This is not new She considers this only a nuisance--not really painful Is on amlodipine--- couldn't increase dose due to swelling

## 2014-08-31 NOTE — Progress Notes (Signed)
Patient ID: [name] [name], female   DOB: 1940/10/09, 73 y.o.   MRN: [id number]  Subjective: 73 year old female presents the office today for painful, elongated toenails. The patient also states that both of her big toes have some slight ingrowing which are painful particularly the pressure in shoe gear. She denies any drainage or erythema around the sites. No other complaints at this time in no acute changes since last appointment. Denies any systemic complaints such as fevers, chills, nausea, vomiting.  Objective: AAO 3, NAD DP/PT pulses palpable, CRT less than 3 seconds Protective sensation intact with Simms Weinstein monofilament Nails hypertrophic, dystrophic, elongated, brittle, discolored 10. There is incurvation of both the distal aspect of the medial/lateral nail borders of bilateral hallux. No surrounding erythema or drainage from the nail sites. No open lesions or pre-ulcerative lesions No pain with calf compression, swelling, warmth, erythema.  Assessment: 73 year old female with symptomatic onychomycosis, ingrown toenail bilateral hallux  Plan: -Treatment options were discussed with the patient including alternatives, risks, complications. -Nail sharply debrided 10 without complications. -Discussed importance of daily foot inspection -Follow-up in 3 months or sooner should any palms arise. In the meantime, call the office with any questions, concerns, change in symptoms.

## 2014-08-31 NOTE — Telephone Encounter (Signed)
Reasonable dispo

## 2014-08-31 NOTE — Progress Notes (Signed)
Pre visit review using our clinic review tool, if applicable. No additional management support is needed unless otherwise documented below in the visit note. 

## 2014-08-31 NOTE — Progress Notes (Signed)
Subjective:    Patient ID: [name] [name], female    DOB: 05-Feb-1941, 73 y.o.   MRN: [id number]  HPI Here due to concerns about her blood pressure  Blood pressures elevated lately At [facility] getting her feet done--- 168/80, 160/89 172/87 and 141/77 by her. Was the lower number after getting back from the store  Had funny feeling in back of head While in store, blanked out on her phone number and zip code---then it popped back in her head  Dizzy yesterday morning stepping off scale Had to grab onto chair to prevent her from falling  Slight occipital headache Dizzy feeling when she moves her head--but not true vertigo  No chest pain Some SOB--- when lying down. She notes heart beating faster (like at night)---- then it will just settle down with some deep breaths  No new stressors No depression or   Also notes again some hand changes In cold weather-- right 2nd finger will blanch white, then turn purplish Gets numb when this happens  Current Outpatient Prescriptions on File Prior to Visit  Medication Sig Dispense Refill  . aspirin 81 MG tablet Take 81 mg by mouth daily.      . celecoxib (CELEBREX) 200 MG capsule Take 1 capsule (200 mg total) by mouth 2 (two) times daily. 180 capsule 1  . Cream Base (PHYTOBASE) CREA Apply topically daily.      Marland Kitchen DIOVAN 80 MG tablet TAKE 1 TABLET BY MOUTH ONCE DAILY 30 tablet 5  . loratadine (CLARITIN) 10 MG tablet Take 10 mg by mouth daily.    Marland Kitchen MINIVELLE 0.0375 MG/24HR Place 1 patch onto the skin 2 (two) times a week.   1  . NORVASC 10 MG tablet TAKE 1 TABLET BY MOUTH EVERY DAY 30 tablet 5  . pantoprazole (PROTONIX) 40 MG tablet TAKE 1 TABLET BY MOUTH TWICE DAILY 180 tablet 1  . Red Yeast Rice 600 MG CAPS Take 4 capsules by mouth daily.     Marland Kitchen VALIUM 2 MG tablet TAKE 1/2 TO 1 TABLET BY MOUTH EVERY 12 HOURS AS NEEDED FOR ANXIETY 60 tablet 0   No current facility-administered medications on file prior to visit.    Allergies  Allergen  Reactions  . Ace Inhibitors     REACTION: swelling  . Beta Adrenergic Blockers     REACTION: swelling  . Statins     REACTION: myalgia  . Tramadol     Does not want to take again.   Constipation, decreased appetite, hot/chills, did not do well with it    Past Medical History  Diagnosis Date  . Hemorrhoids, internal, with bleeding   . Diverticulitis   . Vertigo   . Chronic lower back pain   . Scoliosis   . Vaginitis, atrophic   . OSA (obstructive sleep apnea)   . PUD (peptic ulcer disease)   . Arthritis   . GERD (gastroesophageal reflux disease)   . Hiatal hernia   . HLD (hyperlipidemia)   . Hypertension   . History of colon polyps   . Thyroid disease     hypo  . Thyroid nodule     Past Surgical History  Procedure Laterality Date  . Cholecystectomy      2004  . Ankle fusion  1967    LEFT  . Colonoscopy  06/22/2011    diverticulosis (no adenomas since 2001)  . Fracture surgery Left 1967    foot and ankle surgery    Family History  Problem Relation  Age of Onset  . Cancer Mother     colon   . Colon cancer Mother 46  . Heart disease Mother   . Hypertension Mother   . Heart disease Father     cad  . Deep vein thrombosis Father   . Heart attack Father   . Stomach cancer Sister   . Cancer Sister   . Diabetes Sister   . Heart disease Sister   . Hyperlipidemia Sister   . Hypertension Sister   . Diabetes Brother   . Heart disease Brother     Heart Disease before age 6  . Hyperlipidemia Brother   . Hypertension Brother   . Cancer Sister   . Heart disease Brother   . Heart attack Brother   . Heart disease Brother   . Heart disease Brother     History   Social History  . Marital Status: Married    Spouse Name: N/A    Number of Children: 1  . Years of Education: N/A   Occupational History  .     Social History Main Topics  . Smoking status: Never Smoker   . Smokeless tobacco: Never Used  . Alcohol Use: Yes     Comment: wine occassionally  .  Drug Use: No  . Sexual Activity: Not on file   Other Topics Concern  . Not on file   Social History Narrative   [name] [name] [place], has living will, full code ( reviewed 2015)   Review of Systems  Sleeping well Appetite is fine Has allergies--takes claritin    Objective:   Physical Exam  Constitutional: She appears well-developed and well-nourished. No distress.  Eyes: Conjunctivae are normal. Pupils are equal, round, and reactive to light.  Discs sharp  Neck: Normal range of motion. Neck supple. No thyromegaly present.  Cardiovascular: Normal rate, regular rhythm and normal heart sounds.  Exam reveals no gallop.   No murmur heard. Pulmonary/Chest: Effort normal and breath sounds normal. No respiratory distress. She has no wheezes. She has no rales.  Abdominal: Soft. There is no tenderness.  Lymphadenopathy:    She has no cervical adenopathy.  Neurological:  No focal weakness  Psychiatric: She has a normal mood and affect. Her behavior is normal.          Assessment & Plan:

## 2014-08-31 NOTE — Assessment & Plan Note (Signed)
Has been assessed for RA but apparently doesn't have this Discussed that some people will have their BP go up with celebrex---she thinks she needs to continue this

## 2014-09-03 ENCOUNTER — Other Ambulatory Visit: Payer: Self-pay | Admitting: Family Medicine

## 2014-09-03 ENCOUNTER — Ambulatory Visit: Payer: Medicare Other | Admitting: Family Medicine

## 2014-09-22 ENCOUNTER — Other Ambulatory Visit: Payer: Self-pay | Admitting: Family Medicine

## 2014-09-23 ENCOUNTER — Ambulatory Visit: Payer: Medicare Other

## 2014-09-23 ENCOUNTER — Telehealth: Payer: Self-pay | Admitting: Family Medicine

## 2014-09-23 NOTE — Telephone Encounter (Signed)
Please disregard previous message. [facility] took care of order.  Thanks!

## 2014-09-23 NOTE — Telephone Encounter (Signed)
Please order 6 mo repeat Ultrasound doppler carotid.   [place]

## 2014-09-24 ENCOUNTER — Other Ambulatory Visit: Payer: Self-pay | Admitting: Radiology

## 2014-09-24 DIAGNOSIS — I6523 Occlusion and stenosis of bilateral carotid arteries: Secondary | ICD-10-CM

## 2014-10-01 ENCOUNTER — Encounter (INDEPENDENT_AMBULATORY_CARE_PROVIDER_SITE_OTHER): Payer: Medicare Other

## 2014-10-01 ENCOUNTER — Ambulatory Visit: Payer: Medicare Other | Admitting: Podiatry

## 2014-10-01 ENCOUNTER — Ambulatory Visit: Payer: Medicare Other | Admitting: Family Medicine

## 2014-10-01 DIAGNOSIS — I6523 Occlusion and stenosis of bilateral carotid arteries: Secondary | ICD-10-CM

## 2014-10-05 ENCOUNTER — Encounter: Payer: Self-pay | Admitting: Family Medicine

## 2014-10-05 ENCOUNTER — Ambulatory Visit (INDEPENDENT_AMBULATORY_CARE_PROVIDER_SITE_OTHER): Payer: Medicare Other | Admitting: Family Medicine

## 2014-10-05 VITALS — BP 153/75 | HR 75 | Temp 97.3°F | Ht 64.0 in | Wt 168.5 lb

## 2014-10-05 DIAGNOSIS — Z658 Other specified problems related to psychosocial circumstances: Secondary | ICD-10-CM

## 2014-10-05 DIAGNOSIS — F439 Reaction to severe stress, unspecified: Secondary | ICD-10-CM | POA: Insufficient documentation

## 2014-10-05 DIAGNOSIS — I10 Essential (primary) hypertension: Secondary | ICD-10-CM

## 2014-10-05 DIAGNOSIS — I6523 Occlusion and stenosis of bilateral carotid arteries: Secondary | ICD-10-CM

## 2014-10-05 NOTE — Assessment & Plan Note (Signed)
Improved control with decrease in stress

## 2014-10-05 NOTE — Assessment & Plan Note (Signed)
Continue counseling and stress reduction, relaxation.

## 2014-10-05 NOTE — Progress Notes (Signed)
   Subjective:    Patient ID: [name] [name], female    DOB: 1941/02/10, 74 y.o.   MRN: [id number]  HPI 74 year old female presents for 1 month follow up. Saw Dr. Silvio Pate for HTN and Raynauds phenomena causing finger numbness on 12/28.    At that time her diovan was considered to be  increased to 160 mg daily  She never increased.  Refused to stop celebrex.  Today she comes to clinic with BP still mildly elevated in clinic.  She states that when she was last seen she was stressed out, now better. She denies depression and anxiety currently, no S, no HI.  She has started seeing psychologist. Her BP at home has been running much better at home   133/72, 124/65, 129/70 BP Readings from Last 3 Encounters:  10/05/14 153/75  08/31/14 150/80  08/26/14 160/89     Review of Systems  Constitutional: Negative for fever and fatigue.  HENT: Negative for ear pain.   Eyes: Negative for pain.  Respiratory: Negative for chest tightness and shortness of breath.   Cardiovascular: Negative for chest pain, palpitations and leg swelling.  Gastrointestinal: Negative for abdominal pain.  Genitourinary: Negative for dysuria.       Objective:   Physical Exam  Constitutional: Vital signs are normal. She appears well-developed and well-nourished. She is cooperative.  Non-toxic appearance. She does not appear ill. No distress.  HENT:  Head: Normocephalic.  Right Ear: Hearing, tympanic membrane, external ear and ear canal normal. Tympanic membrane is not erythematous, not retracted and not bulging.  Left Ear: Hearing, tympanic membrane, external ear and ear canal normal. Tympanic membrane is not erythematous, not retracted and not bulging.  Nose: No mucosal edema or rhinorrhea. Right sinus exhibits no maxillary sinus tenderness and no frontal sinus tenderness. Left sinus exhibits no maxillary sinus tenderness and no frontal sinus tenderness.  Mouth/Throat: Uvula is midline, oropharynx is clear and moist  and mucous membranes are normal.  Eyes: Conjunctivae, EOM and lids are normal. Pupils are equal, round, and reactive to light. Lids are everted and swept, no foreign bodies found.  Neck: Trachea normal and normal range of motion. Neck supple. Carotid bruit is not present. No thyroid mass and no thyromegaly present.  Cardiovascular: Normal rate, regular rhythm, S1 normal, S2 normal, normal heart sounds, intact distal pulses and normal pulses.  Exam reveals no gallop and no friction rub.   No murmur heard. 1 plus edemsa, better than usual.  Pulmonary/Chest: Effort normal and breath sounds normal. No tachypnea. No respiratory distress. She has no decreased breath sounds. She has no wheezes. She has no rhonchi. She has no rales.  Abdominal: Soft. Normal appearance and bowel sounds are normal. There is no tenderness.  Neurological: She is alert.  Skin: Skin is warm, dry and intact. No rash noted.  Psychiatric: Her speech is normal and behavior is normal. Judgment and thought content normal. Her mood appears not anxious. Cognition and memory are normal. She does not exhibit a depressed mood.          Assessment & Plan:

## 2014-10-05 NOTE — Progress Notes (Signed)
Pre visit review using our clinic review tool, if applicable. No additional management support is needed unless otherwise documented below in the visit note. 

## 2014-10-05 NOTE — Patient Instructions (Signed)
Keep working on stress reduction, healthy eating and regularly exercise.  keep well ness as scheduled.

## 2014-10-06 ENCOUNTER — Telehealth: Payer: Self-pay | Admitting: *Deleted

## 2014-10-06 NOTE — Telephone Encounter (Signed)
Norvasc 10 mg is approved for non-formulary exception thru 09/04/2015 under Medicare Part D benefit.  Mrs. Vanevery notified.

## 2014-10-06 NOTE — Telephone Encounter (Signed)
[name] [name] stated at her appointment yesterday that her insurance did not cover her Norvasc 10 mg (Brand Name) last time she got it refilled at the pharmacy.  Insurance requires Building control surveyor Request due to requesting Brand Name. Form completed on CoverMyMeds.

## 2014-10-26 ENCOUNTER — Other Ambulatory Visit: Payer: Self-pay | Admitting: Family Medicine

## 2014-10-26 NOTE — Telephone Encounter (Signed)
Last office visit 10/05/2014.  Last refilled 09/08/2013 for #180 with 1 refill.  Ok to refill?

## 2014-11-06 ENCOUNTER — Ambulatory Visit: Payer: Medicare Other | Admitting: Family Medicine

## 2014-11-12 ENCOUNTER — Ambulatory Visit: Payer: Medicare Other | Admitting: Family Medicine

## 2014-11-13 ENCOUNTER — Encounter: Payer: Self-pay | Admitting: Family Medicine

## 2014-11-13 ENCOUNTER — Ambulatory Visit (INDEPENDENT_AMBULATORY_CARE_PROVIDER_SITE_OTHER): Payer: Medicare Other | Admitting: Family Medicine

## 2014-11-13 VITALS — BP 156/80 | HR 78 | Temp 97.4°F | Ht 64.0 in | Wt 165.8 lb

## 2014-11-13 DIAGNOSIS — K5792 Diverticulitis of intestine, part unspecified, without perforation or abscess without bleeding: Secondary | ICD-10-CM | POA: Diagnosis not present

## 2014-11-13 DIAGNOSIS — R3989 Other symptoms and signs involving the genitourinary system: Secondary | ICD-10-CM | POA: Insufficient documentation

## 2014-11-13 DIAGNOSIS — I6523 Occlusion and stenosis of bilateral carotid arteries: Secondary | ICD-10-CM

## 2014-11-13 LAB — POCT URINALYSIS DIPSTICK
Bilirubin, UA: NEGATIVE
Blood, UA: NEGATIVE
Glucose, UA: NEGATIVE
KETONES UA: NEGATIVE
LEUKOCYTES UA: NEGATIVE
NITRITE UA: NEGATIVE
PROTEIN UA: NEGATIVE
Spec Grav, UA: 1.015
Urobilinogen, UA: 0.2
pH, UA: 6

## 2014-11-13 MED ORDER — CIPROFLOXACIN HCL 500 MG PO TABS: 500.0000 mg | ORAL_TABLET | Freq: Two times a day (BID) | ORAL | Status: DC

## 2014-11-13 NOTE — Progress Notes (Signed)
   Subjective:    Patient ID: [name] [name], female    DOB: 1940-09-11, 74 y.o.   MRN: [id number]   HPI   74 year old female with history of  HTN, hx of diverticular disease presents with  new onset  Pain in right lower abdomen x achy x several months intermittantly. Sharp pain worse with sitting x 5 days, improving with standing up. No N/V, no diarrhea, no blood in stool. Eating a lot of nuts lately.  Recent burning with urination when wakes up in AMs. Ongoing x several weeks Feels spasms in urethra. No new foods, restarted Claritin over the winter.'  2 days of frequency and urgency. No blood in urine. No fever, No CVA tenderness.    Review of Systems  Constitutional: Negative for fever, chills and fatigue.  Respiratory: Negative for shortness of breath.   Cardiovascular: Negative for chest pain.  Gastrointestinal: Positive for abdominal pain. Negative for nausea, diarrhea, constipation and blood in stool.       Objective:   Physical Exam  Constitutional: Vital signs are normal. She appears well-developed and well-nourished. She is cooperative.  Non-toxic appearance. She does not appear ill. No distress.  HENT:  Head: Normocephalic.  Right Ear: Hearing, tympanic membrane, external ear and ear canal normal. Tympanic membrane is not erythematous, not retracted and not bulging.  Left Ear: Hearing, tympanic membrane, external ear and ear canal normal. Tympanic membrane is not erythematous, not retracted and not bulging.  Nose: No mucosal edema or rhinorrhea. Right sinus exhibits no maxillary sinus tenderness and no frontal sinus tenderness. Left sinus exhibits no maxillary sinus tenderness and no frontal sinus tenderness.  Mouth/Throat: Uvula is midline, oropharynx is clear and moist and mucous membranes are normal.  Eyes: Conjunctivae, EOM and lids are normal. Pupils are equal, round, and reactive to light. Lids are everted and swept, no foreign bodies found.  Neck: Trachea normal  and normal range of motion. Neck supple. Carotid bruit is not present. No thyroid mass and no thyromegaly present.  Cardiovascular: Normal rate, regular rhythm, S1 normal, S2 normal, normal heart sounds, intact distal pulses and normal pulses.  Exam reveals no gallop and no friction rub.   No murmur heard. Pulmonary/Chest: Effort normal and breath sounds normal. No tachypnea. No respiratory distress. She has no decreased breath sounds. She has no wheezes. She has no rhonchi. She has no rales.  Abdominal: Soft. Normal appearance and bowel sounds are normal. There is no hepatosplenomegaly. There is tenderness in the left lower quadrant. There is no rigidity, no rebound, no guarding and no CVA tenderness. No hernia.  Neurological: She is alert.  Skin: Skin is warm, dry and intact. No rash noted.  Psychiatric: Her speech is normal and behavior is normal. Judgment and thought content normal. Her mood appears not anxious. Cognition and memory are normal. She does not exhibit a depressed mood.          Assessment & Plan:

## 2014-11-13 NOTE — Patient Instructions (Signed)
Push fluids, avoid bladder irritants such as alcohol, caffeine, citrus, tomato, spicy foods. Compete antibiotics for presumed diverticulitis x 10 days. If symptoms not improving in 72 hours call.  Go to ER for severe pain.  Diverticulitis Diverticulitis is inflammation or infection of small pouches in your colon that form when you have a condition called diverticulosis. The pouches in your colon are called diverticula. Your colon, or large intestine, is where water is absorbed and stool is formed. Complications of diverticulitis can include:  Bleeding.  Severe infection.  Severe pain.  Perforation of your colon.  Obstruction of your colon. CAUSES  Diverticulitis is caused by bacteria. Diverticulitis happens when stool becomes trapped in diverticula. This allows bacteria to grow in the diverticula, which can lead to inflammation and infection. RISK FACTORS People with diverticulosis are at risk for diverticulitis. Eating a diet that does not include enough fiber from fruits and vegetables may make diverticulitis more likely to develop. SYMPTOMS  Symptoms of diverticulitis may include:  Abdominal pain and tenderness. The pain is normally located on the left side of the abdomen, but may occur in other areas.  Fever and chills.  Bloating.  Cramping.  Nausea.  Vomiting.  Constipation.  Diarrhea.  Blood in your stool. DIAGNOSIS  Your health care provider will ask you about your medical history and do a physical exam. You may need to have tests done because many medical conditions can cause the same symptoms as diverticulitis. Tests may include:  Blood tests.  Urine tests.  Imaging tests of the abdomen, including X-rays and CT scans. When your condition is under control, your health care provider may recommend that you have a colonoscopy. A colonoscopy can show how severe your diverticula are and whether something else is causing your symptoms. TREATMENT  Most cases of  diverticulitis are mild and can be treated at home. Treatment may include:  Taking over-the-counter pain medicines.  Following a clear liquid diet.  Taking antibiotic medicines by mouth for 7-10 days. More severe cases may be treated at a hospital. Treatment may include:  Not eating or drinking.  Taking prescription pain medicine.  Receiving antibiotic medicines through an IV tube.  Receiving fluids and nutrition through an IV tube.  Surgery. HOME CARE INSTRUCTIONS   Follow your health care provider's instructions carefully.  Follow a full liquid diet or other diet as directed by your health care provider. After your symptoms improve, your health care provider may tell you to change your diet. He or she may recommend you eat a high-fiber diet. Fruits and vegetables are good sources of fiber. Fiber makes it easier to pass stool.  Take fiber supplements or probiotics as directed by your health care provider.  Only take medicines as directed by your health care provider.  Keep all your follow-up appointments. SEEK MEDICAL CARE IF:   Your pain does not improve.  You have a hard time eating food.  Your bowel movements do not return to normal. SEEK IMMEDIATE MEDICAL CARE IF:   Your pain becomes worse.  Your symptoms do not get better.  Your symptoms suddenly get worse.  You have a fever.  You have repeated vomiting.  You have bloody or black, tarry stools. MAKE SURE YOU:   Understand these instructions.  Will watch your condition.  Will get help right away if you are not doing well or get worse. Document Released: 05/31/2005 Document Revised: 08/26/2013 Document Reviewed: 07/16/2013 [facility] Patient Information 2015 [place], [place]. This information is not intended to  replace advice given to you by your health care provider. Make sure you discuss any questions you have with your health care provider.  

## 2014-11-13 NOTE — Assessment & Plan Note (Signed)
Treat empirically given history  With cipro given milder case.

## 2014-11-13 NOTE — Progress Notes (Signed)
Pre visit review using our clinic review tool, if applicable. No additional management support is needed unless otherwise documented below in the visit note. 

## 2014-11-13 NOTE — Assessment & Plan Note (Signed)
UA clear , no sign of UTI. Likely bladder irritation and spasm.  Push fluids, avoid bladder irritants.

## 2014-11-20 ENCOUNTER — Encounter: Payer: Self-pay | Admitting: Family Medicine

## 2014-11-20 ENCOUNTER — Ambulatory Visit (INDEPENDENT_AMBULATORY_CARE_PROVIDER_SITE_OTHER): Payer: Medicare Other | Admitting: Family Medicine

## 2014-11-20 VITALS — BP 128/72 | HR 88 | Temp 97.6°F | Wt 167.8 lb

## 2014-11-20 DIAGNOSIS — K5792 Diverticulitis of intestine, part unspecified, without perforation or abscess without bleeding: Secondary | ICD-10-CM | POA: Diagnosis not present

## 2014-11-20 DIAGNOSIS — R1013 Epigastric pain: Secondary | ICD-10-CM | POA: Insufficient documentation

## 2014-11-20 DIAGNOSIS — R3989 Other symptoms and signs involving the genitourinary system: Secondary | ICD-10-CM

## 2014-11-20 DIAGNOSIS — I6523 Occlusion and stenosis of bilateral carotid arteries: Secondary | ICD-10-CM

## 2014-11-20 NOTE — Assessment & Plan Note (Signed)
?   Referred pain.Marland Kitchen Resolved with antibiotics.

## 2014-11-20 NOTE — Assessment & Plan Note (Signed)
Improving significantly with cipro. Complete course.

## 2014-11-20 NOTE — Assessment & Plan Note (Signed)
?   Secondary to gastritis vs PUD.  She will try to decrease celebrex but refuses to stop.  If not improving consider Hpylori and referral to GI for endoscopy eval.
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
## 2014-11-20 NOTE — Patient Instructions (Addendum)
Complete antibiotics for diverticulitis,.  Consider stopping celebrex as this could be starting stomach irritation.  Continue pantoprazole. Can add pepcid AC or zantac. Follow up if epigastric pain not improving or further eval..

## 2014-11-20 NOTE — Progress Notes (Signed)
Pre visit review using our clinic review tool, if applicable. No additional management support is needed unless otherwise documented below in the visit note. 

## 2014-11-24 ENCOUNTER — Ambulatory Visit: Payer: Medicare Other | Admitting: Podiatry

## 2014-11-25 ENCOUNTER — Encounter: Payer: Self-pay | Admitting: Podiatry

## 2014-11-25 ENCOUNTER — Ambulatory Visit (INDEPENDENT_AMBULATORY_CARE_PROVIDER_SITE_OTHER): Payer: Medicare Other | Admitting: Podiatry

## 2014-11-25 DIAGNOSIS — B351 Tinea unguium: Secondary | ICD-10-CM | POA: Diagnosis not present

## 2014-11-25 DIAGNOSIS — M79676 Pain in unspecified toe(s): Secondary | ICD-10-CM

## 2014-11-25 NOTE — Progress Notes (Signed)
She presents today chief complaint of painful elongated toenails.  Objective: Vital signs are stable she is alert and oriented 3. Nails are thick yellow dystrophic with mycotic and painful palpation.  Assessment: Pain in limb secondary to onychomycosis 1 through 5 bilateral. Leg length discrepancy.  Plan: Debrided nails 1 through 5 bilateral. Wrote a prescription for biotech to add to windshield to her left foot.

## 2014-11-26 ENCOUNTER — Telehealth: Payer: Self-pay | Admitting: Internal Medicine

## 2014-11-26 DIAGNOSIS — G4733 Obstructive sleep apnea (adult) (pediatric): Secondary | ICD-10-CM

## 2014-11-26 NOTE — Telephone Encounter (Signed)
Spoke with patient, states that she needs a new CPAP machine. Per [facility] patient is eligible for new machine, needs an order. Pt states that her current machine is a [employer] copy[employer]. DME [facility] Order placed, patient has a recall appt set for 01/2015.  Nothing further needed.

## 2014-12-08 ENCOUNTER — Telehealth: Payer: Self-pay | Admitting: Family Medicine

## 2014-12-08 DIAGNOSIS — E7800 Pure hypercholesterolemia, unspecified: Secondary | ICD-10-CM

## 2014-12-08 NOTE — Telephone Encounter (Signed)
-----   Message from Ellamae Sia sent at 12/08/2014  3:35 PM EDT ----- Regarding: Lab orders for Friday, 4.8.16 Patient is scheduled for CPX labs, please order future labs, Thanks , Karna Christmas

## 2014-12-10 ENCOUNTER — Telehealth: Payer: Self-pay | Admitting: Internal Medicine

## 2014-12-10 ENCOUNTER — Other Ambulatory Visit: Payer: Self-pay | Admitting: Family Medicine

## 2014-12-10 NOTE — Telephone Encounter (Signed)
[email or web address] x1 

## 2014-12-10 NOTE — Telephone Encounter (Signed)
Fine to see TP- Thanks

## 2014-12-10 NOTE — Telephone Encounter (Signed)
Pt needs an appointment for a face to face evaluation per [facility]. CY does not have any appointments until June.  CY - can pt see TP? Thanks.

## 2014-12-11 ENCOUNTER — Other Ambulatory Visit (INDEPENDENT_AMBULATORY_CARE_PROVIDER_SITE_OTHER): Payer: Medicare Other

## 2014-12-11 ENCOUNTER — Encounter: Payer: Self-pay | Admitting: Family Medicine

## 2014-12-11 ENCOUNTER — Other Ambulatory Visit: Payer: Medicare Other

## 2014-12-11 ENCOUNTER — Ambulatory Visit (INDEPENDENT_AMBULATORY_CARE_PROVIDER_SITE_OTHER): Payer: Medicare Other | Admitting: Family Medicine

## 2014-12-11 VITALS — BP 165/83 | HR 84 | Temp 98.3°F | Ht 64.0 in | Wt 164.5 lb

## 2014-12-11 DIAGNOSIS — R1013 Epigastric pain: Secondary | ICD-10-CM

## 2014-12-11 DIAGNOSIS — E7800 Pure hypercholesterolemia, unspecified: Secondary | ICD-10-CM

## 2014-12-11 DIAGNOSIS — R1032 Left lower quadrant pain: Secondary | ICD-10-CM

## 2014-12-11 DIAGNOSIS — I6523 Occlusion and stenosis of bilateral carotid arteries: Secondary | ICD-10-CM | POA: Diagnosis not present

## 2014-12-11 LAB — COMPREHENSIVE METABOLIC PANEL
ALK PHOS: 74 U/L (ref 39–117)
ALT: 20 U/L (ref 0–35)
AST: 14 U/L (ref 0–37)
Albumin: 4.5 g/dL (ref 3.5–5.2)
BILIRUBIN TOTAL: 0.5 mg/dL (ref 0.2–1.2)
BUN: 23 mg/dL (ref 6–23)
CO2: 27 mEq/L (ref 19–32)
CREATININE: 0.75 mg/dL (ref 0.40–1.20)
Calcium: 10 mg/dL (ref 8.4–10.5)
Chloride: 103 mEq/L (ref 96–112)
GFR: 80 mL/min (ref >60.00)
Glucose, Bld: 99 mg/dL (ref 70–99)
Potassium: 4.1 mEq/L (ref 3.5–5.1)
SODIUM: 138 meq/L (ref 135–145)
TOTAL PROTEIN: 7.7 g/dL (ref 6.0–8.3)

## 2014-12-11 LAB — LIPID PANEL
CHOL/HDL RATIO: 3
Cholesterol: 200 mg/dL (ref 0–200)
HDL: 65.6 mg/dL (ref >39.00)
LDL CALC: 109 mg/dL — AB (ref 0–99)
NonHDL: 134.4
Triglycerides: 129 mg/dL (ref 0.0–149.0)
VLDL: 25.8 mg/dL (ref 0.0–40.0)

## 2014-12-11 NOTE — Telephone Encounter (Signed)
Pt returning call.[name] [name] ° °

## 2014-12-11 NOTE — Progress Notes (Signed)
   Subjective:    Patient ID: [name] [name], female    DOB: 1940-09-05, 74 y.o.   MRN: [id number]  HPI  74 year old female with history of  HTN, and recent acute diverticulitis and epigastric pain pesents with continued bloating,  gas,  in epigastrum. Iintermittant pain,  In back and in upper abdomen. After eating discomfort in upper abdomen. Protonix helps some. Continues celebrex despite recommendations not to. She is interested in doing upper GI series with barium contrast .  Never had an endoscopy.  She refuses to return to Dr. Carlean Purl, wil not specify why.   Occ mild ache in left lower quadrant despite cipro several weeks ago for diverticulitis.   Tried taking gas-X.  No N/V/D, no blood in stool  Last colonoscopy: 2012, nml, Mom with colon cancer ( dx age 48).  Review of Systems  Constitutional: Negative for fever and fatigue.  HENT: Negative for ear pain.   Eyes: Negative for pain.  Respiratory: Negative for chest tightness and shortness of breath.   Cardiovascular: Negative for chest pain, palpitations and leg swelling.  Gastrointestinal: Positive for abdominal pain.  Genitourinary: Negative for dysuria.       Objective:   Physical Exam  Constitutional: Vital signs are normal. She appears well-developed and well-nourished. She is cooperative.  Non-toxic appearance. She does not appear ill. No distress.  HENT:  Head: Normocephalic.  Right Ear: Hearing, tympanic membrane, external ear and ear canal normal. Tympanic membrane is not erythematous, not retracted and not bulging.  Left Ear: Hearing, tympanic membrane, external ear and ear canal normal. Tympanic membrane is not erythematous, not retracted and not bulging.  Nose: No mucosal edema or rhinorrhea. Right sinus exhibits no maxillary sinus tenderness and no frontal sinus tenderness. Left sinus exhibits no maxillary sinus tenderness and no frontal sinus tenderness.  Mouth/Throat: Uvula is midline, oropharynx is  clear and moist and mucous membranes are normal.  Eyes: Conjunctivae, EOM and lids are normal. Pupils are equal, round, and reactive to light. Lids are everted and swept, no foreign bodies found.  Neck: Trachea normal and normal range of motion. Neck supple. Carotid bruit is not present. No thyroid mass and no thyromegaly present.  Cardiovascular: Normal rate, regular rhythm, S1 normal, S2 normal, normal heart sounds, intact distal pulses and normal pulses.  Exam reveals no gallop and no friction rub.   No murmur heard. 1 plus swelling in bilateral ankles.  Pulmonary/Chest: Effort normal and breath sounds normal. No tachypnea. No respiratory distress. She has no decreased breath sounds. She has no wheezes. She has no rhonchi. She has no rales.  Abdominal: Soft. Normal appearance and bowel sounds are normal. There is no hepatosplenomegaly. There is tenderness in the epigastric area and left lower quadrant. There is no rigidity, no rebound, no guarding and no CVA tenderness. No hernia. Hernia confirmed negative in the ventral area.  Neurological: She is alert.  Skin: Skin is warm, dry and intact. No rash noted.  Psychiatric: Her speech is normal and behavior is normal. Judgment and thought content normal. Her mood appears not anxious. Cognition and memory are normal. She does not exhibit a depressed mood.          Assessment & Plan:

## 2014-12-11 NOTE — Telephone Encounter (Signed)
Called to [facility] [address] [place]. 

## 2014-12-11 NOTE — Patient Instructions (Addendum)
Stop at lab on way out for Hpylori. Stop at front desk to set up CT abd pelvis.  We will call with results.

## 2014-12-11 NOTE — Progress Notes (Signed)
Pre visit review using our clinic review tool, if applicable. No additional management support is needed unless otherwise documented below in the visit note. 

## 2014-12-11 NOTE — Telephone Encounter (Signed)
Last office visit 11/20/2014.  Last refilled 08/25/2014 for # 60 with no refills.  Ok to refill?

## 2014-12-11 NOTE — Telephone Encounter (Signed)
Pt has been scheduled to see TP on 12/23/14 at 10:30am. Nothing further was needed.

## 2014-12-14 ENCOUNTER — Ambulatory Visit
Admit: 2014-12-14 | Disposition: A | Discharge status: Home / Self Care | Payer: Self-pay | Attending: Family Medicine | Admitting: Family Medicine

## 2014-12-14 ENCOUNTER — Telehealth: Payer: Self-pay | Admitting: Radiology

## 2014-12-14 ENCOUNTER — Encounter: Payer: Self-pay | Admitting: Family Medicine

## 2014-12-14 NOTE — Telephone Encounter (Signed)
[facility] notified us today that stool or breath is the only sample they except for h pylorie testing. No tube rec'd for the CBC.  Patient was notified to collect a stool sample and that we would need to draw blood for a CBC.

## 2014-12-16 ENCOUNTER — Other Ambulatory Visit: Payer: Medicare Other

## 2014-12-18 ENCOUNTER — Ambulatory Visit (INDEPENDENT_AMBULATORY_CARE_PROVIDER_SITE_OTHER): Payer: Medicare Other | Admitting: Family Medicine

## 2014-12-18 VITALS — BP 120/74 | HR 81 | Temp 97.4°F | Ht 63.0 in | Wt 167.0 lb

## 2014-12-18 DIAGNOSIS — E7800 Pure hypercholesterolemia, unspecified: Secondary | ICD-10-CM

## 2014-12-18 DIAGNOSIS — I10 Essential (primary) hypertension: Secondary | ICD-10-CM

## 2014-12-18 DIAGNOSIS — R1013 Epigastric pain: Secondary | ICD-10-CM | POA: Diagnosis not present

## 2014-12-18 DIAGNOSIS — Z0000 Encounter for general adult medical examination without abnormal findings: Secondary | ICD-10-CM | POA: Diagnosis not present

## 2014-12-18 DIAGNOSIS — I6523 Occlusion and stenosis of bilateral carotid arteries: Secondary | ICD-10-CM | POA: Diagnosis not present

## 2014-12-18 DIAGNOSIS — R1032 Left lower quadrant pain: Secondary | ICD-10-CM

## 2014-12-18 DIAGNOSIS — Z7189 Other specified counseling: Secondary | ICD-10-CM | POA: Insufficient documentation

## 2014-12-18 LAB — CBC WITH DIFFERENTIAL/PLATELET
BASOS PCT: 0.7 % (ref 0.0–3.0)
Basophils Absolute: 0 10*3/uL (ref 0.0–0.1)
EOS ABS: 0.1 10*3/uL (ref 0.0–0.7)
EOS PCT: 1.7 % (ref 0.0–5.0)
HCT: 42.4 % (ref 36.0–46.0)
Hemoglobin: 14.4 g/dL (ref 12.0–15.0)
LYMPHS PCT: 38.1 % (ref 12.0–46.0)
Lymphs Abs: 2.8 10*3/uL (ref 0.7–4.0)
MCHC: 34 g/dL (ref 30.0–36.0)
MCV: 87.8 fl (ref 78.0–100.0)
Monocytes Absolute: 0.4 10*3/uL (ref 0.1–1.0)
Monocytes Relative: 5.4 % (ref 3.0–12.0)
NEUTROS PCT: 54.1 % (ref 43.0–77.0)
Neutro Abs: 4 10*3/uL (ref 1.4–7.7)
Platelets: 329 10*3/uL (ref 150.0–400.0)
RBC: 4.82 Mil/uL (ref 3.87–5.11)
RDW: 13.2 % (ref 11.5–15.5)
WBC: 7.4 10*3/uL (ref 4.0–10.5)

## 2014-12-18 NOTE — Patient Instructions (Addendum)
Continue working on low chol diet. Continue red yeast rice. Increase exercise as tolerated.  When ready return for a nurse visit for prevnar.

## 2014-12-18 NOTE — Progress Notes (Signed)
Pre visit review using our clinic review tool, if applicable. No additional management support is needed unless otherwise documented below in the visit note. 

## 2014-12-18 NOTE — Progress Notes (Signed)
I have personally reviewed the Medicare Annual Wellness questionnaire and have noted  1. The patient's medical and social history  2. Their use of alcohol, tobacco or illicit drugs  3. Their current medications and supplements  4. The patient's functional ability including ADL's, fall risks, home safety risks and hearing or visual  impairment.  5. Diet and physical activities  6. Evidence for depression or mood disorders  7. List of providers reviewed and documented. The patients weight, height, BMI and visual acuity have been recorded in the chart  I have made referrals, counseling and provided education to the patient based review of the above and I have provided the pt with a written personalized care plan for preventive services.   Abd pelvic CT: nml except constipation. She had two big BMs and now pain is resolved. Has increased water as well.  Hypertension: Well controlled on current regimen., BP Readings from Last 3 Encounters:  12/18/14 120/74  12/11/14 165/83  11/20/14 128/72  Using medication without problems or lightheadedness: None  Chest pain with exertion:None  Edema: intermittent , but better than previously. Short of breath:None  Average home BPs: not checking lately.  Other issues:   Elevated Cholesterol: 4 tabs daily of red yeast rice.Marland Kitchen LDL NOT at goal <70.  Lab Results  Component Value Date   CHOL 192 12/22/2013   HDL 69.30 12/22/2013   LDLCALC 88 12/22/2013   LDLDIRECT 114.4 06/10/2013   TRIG 174.0* 12/22/2013   CHOLHDL 3 12/22/2013   Lab Results  Component Value Date   CHOL 200 12/11/2014   HDL 65.60 12/11/2014   LDLCALC 109* 12/11/2014   LDLDIRECT 114.4 06/10/2013   TRIG 129.0 12/11/2014   CHOLHDL 3 12/11/2014  SE to statins.  Using medications without problems:  Muscle aches: None  Diet compliance:  Having more pork barbeque, fatty foods. Exercise:[employer],  three times  Week. Other complaints: right  carotid artery stenosis   Thyroid Nodules: Muliptle thyroid nodules, followed by ENDO. BX showed no malignancy. TSH stable recent check..   Continues to use valium regularly at bedtime. Sleeping better at night.  Using back brace as needed.  No falls, no sedation with this med.     Medication List       This list is accurate as of: 12/25/13 11:11 AM. Always use your most recent med list.              aspirin 81 MG tablet  Take 81 mg by mouth daily.     celecoxib 200 MG capsule  Commonly known as: CELEBREX  Take 1 capsule (200 mg total) by mouth 2 (two) times daily.     estradiol 0.05 MG/24HR patch  Commonly known as: VIVELLE-DOT  Place 1 patch onto the skin 2 (two) times a week.     guaiFENesin-codeine 100-10 MG/5ML syrup  Commonly known as: ROBITUSSIN AC  Take 5-10 mLs by mouth at bedtime as needed for cough.     loratadine 10 MG tablet  Commonly known as: CLARITIN  Take 10 mg by mouth daily.     NORVASC 10 MG tablet  Generic drug: amLODipine  Take 0.5 tablets (5 mg total) by mouth daily.     pantoprazole 40 MG tablet  Commonly known as: PROTONIX  Take 1 tablet (40 mg total) by mouth 2 (two) times daily.     PHYTOBASE Crea  Apply topically daily.     Red Yeast Rice 600 MG Caps  Take 4 capsules by mouth daily.  VALIUM 2 MG tablet  Generic drug: diazepam  TAKE 1/2 TO 1 TABLET BY MOUTH EVERY 12 HOURS AS NEEDED FOR ANXIETY     valsartan 80 MG tablet  Commonly known as: DIOVAN  Take 1 tablet (80 mg total) by mouth daily.         Review of Systems  Constitutional: Negative for fever and fatigue.  HENT: Negative for ear pain.  Eyes: Negative for pain.  Respiratory: Negative for chest tightness and shortness of breath.  Cardiovascular: Negative for chest pain, palpitations and leg swelling.  Gastrointestinal: Negative for abdominal pain.  Genitourinary: Negative for  dysuria.  Musculoskeletal: Negative for back pain.  Objective:   Physical Exam  Constitutional: Vital signs are normal. She appears well-developed and well-nourished. She is cooperative. Non-toxic appearance. She does not appear ill. No distress.  HENT:  Head: Normocephalic.  Right Ear: Hearing, tympanic membrane, external ear and ear canal normal.  Left Ear: Hearing, tympanic membrane, external ear and ear canal normal.  Nose: Nose normal.  Eyes: Conjunctivae, EOM and lids are normal. Pupils are equal, round, and reactive to light. No foreign bodies found.  Neck: Trachea normal and normal range of motion. Neck supple. Carotid bruit is not present. No mass and no thyromegaly present.  Cardiovascular: Normal rate, regular rhythm, S1 normal, S2 normal, normal heart sounds and intact distal pulses. Exam reveals no gallop.  No murmur heard.  Breast Exam: per GYN.  Pulmonary/Chest: Effort normal and breath sounds normal. No respiratory distress. She has no wheezes. She has no rhonchi. She has no rales.  Abdominal: Soft. Normal appearance and bowel sounds are normal. She exhibits no distension, no fluid wave, no abdominal bruit and no mass. There is no hepatosplenomegaly. There is no tenderness. There is no rebound, no guarding and no CVA tenderness. No hernia.  Lymphadenopathy:  She has no cervical adenopathy.  She has no axillary adenopathy.  Neurological: She is alert. She has normal strength. No cranial nerve deficit or sensory deficit.  Skin: Skin is warm, dry and intact. No rash noted.  Psychiatric: Her speech is normal and behavior is normal. Judgment normal. Her mood appears not anxious. Cognition and memory are normal. She does not exhibit a depressed mood.  Assessment & Plan:   AMW: The patient's preventative maintenance and recommended screening tests for an annual wellness exam were reviewed in full today.  Brought up to date unless services declined.  Counselled on  the importance of diet, exercise, and its role in overall health and mortality.  The patient's FH and SH was reviewed, including their home life, tobacco status, and drug and alcohol status.   Vaccines: Uptodate with shingles, Td and PNA, she will consider prevnar  DEXA: osteopenia 06/2009, due.. Not interested.  Mammogram: Dr. Kenton Kingfisher GYN does breast exam, refuses mammograms  Colon: Dr. Carlean Purl.Budd Palmer 2012, she is considering virtual colonoscopy instead at repeat. Mother with colon cancer.  DVE/PAP: Sees GYN Dr. Kenton Kingfisher, no pap indicated, no risk factors for ovarian/uterine cancer.. No further DVE.

## 2014-12-20 ENCOUNTER — Encounter: Payer: Self-pay | Admitting: Family Medicine

## 2014-12-20 DIAGNOSIS — R1032 Left lower quadrant pain: Secondary | ICD-10-CM | POA: Insufficient documentation

## 2014-12-20 NOTE — Assessment & Plan Note (Signed)
Continued. Will eval with cbc and Hpylori. Recommended consideration again of return to GI for further eval.

## 2014-12-20 NOTE — Assessment & Plan Note (Signed)
CT nml except constipation. Pain improved with 2 large BMs and passage of gas.

## 2014-12-20 NOTE — Assessment & Plan Note (Signed)
Well controlled. Continue current medication.  

## 2014-12-20 NOTE — Assessment & Plan Note (Signed)
LDL not at goal , 70-. SE to statins in past. On red yeast rcie, pt will work on lifestyle changes.

## 2014-12-21 ENCOUNTER — Encounter: Payer: Self-pay | Admitting: *Deleted

## 2014-12-22 ENCOUNTER — Ambulatory Visit: Payer: Medicare Other | Admitting: Adult Health

## 2014-12-23 ENCOUNTER — Encounter: Payer: Self-pay | Admitting: Adult Health

## 2014-12-23 ENCOUNTER — Ambulatory Visit (INDEPENDENT_AMBULATORY_CARE_PROVIDER_SITE_OTHER): Payer: Medicare Other | Admitting: Adult Health

## 2014-12-23 VITALS — BP 132/80 | HR 74 | Temp 97.4°F | Ht 64.0 in | Wt 166.6 lb

## 2014-12-23 DIAGNOSIS — J3089 Other allergic rhinitis: Secondary | ICD-10-CM

## 2014-12-23 DIAGNOSIS — G4733 Obstructive sleep apnea (adult) (pediatric): Secondary | ICD-10-CM | POA: Diagnosis not present

## 2014-12-23 DIAGNOSIS — I6523 Occlusion and stenosis of bilateral carotid arteries: Secondary | ICD-10-CM | POA: Diagnosis not present

## 2014-12-23 DIAGNOSIS — J302 Other seasonal allergic rhinitis: Secondary | ICD-10-CM

## 2014-12-23 DIAGNOSIS — J309 Allergic rhinitis, unspecified: Secondary | ICD-10-CM | POA: Diagnosis not present

## 2014-12-23 MED ORDER — OLOPATADINE HCL 0.2 % OP SOLN: 1.0000 [drp] | Freq: Every day | OPHTHALMIC | Status: DC

## 2014-12-23 NOTE — Assessment & Plan Note (Signed)
Ok to use Claritin as needed  [place] As needed    Follow up Dr. Annamaria Boots  In 1 year and As needed

## 2014-12-23 NOTE — Patient Instructions (Addendum)
We can continue CPAP Autotitration   Ok to use Claritin as needed  Pataday As needed    Follow up Dr. Annamaria Boots  In 1 year and As needed

## 2014-12-23 NOTE — Progress Notes (Signed)
08/08/11-74 year old female never smoker followed for obstructive sleep apnea, allergic rhinitis LOV-08/09/10 She plans to get flu shot today at the drugstore. Continues daily use of CPAP 9/[facility] all night every night. Has lost 20 pounds with [employer] and would like to have her pressure reduced to 8. Being treated medically for her degenerative disc disease and back pain without surgery planned. Nasal congestion has not been a problem.  12/31/12- 73 year old female never smoker followed for obstructive sleep apnea, allergic rhinitis FOLLOWS FOR: now uses Elite [employer]med machine now; wears CPAP every night for about 6-8 hours and states since new machine she is doing great(sleeps deeper and feels more alert during the day. This machine adjusts to what pressure she needs. CPAP/AutoPap/[facility] well tolerated with good control. No longer snoring. Minimal rhinitis symptoms this pollen season. Has not needed medications. Concerned about bilateral ankle edema and we discussed her calcium channel blocker.  01/22/14- 74 year old female never smoker followed for obstructive sleep apnea, allergic rhinitis/conjunctivitis FOLLOWS FOR: Wears CPAP auto/[facility] every night for about 7 hours; pressure works well for patient.  Claritin has been sufficient for watery eyes  12/23/2014 74 year old female never smoker followed for obstructive sleep apnea, allergic rhinitis/conjunctivitis Returns for a follow up for OSA.   Pt stated she is wearing CPAP nightly for 8 hours. Needs new machine. [facility]  Feels well rested, no daytime sleepiness.  No snoring.  Pt denies issues with pressure or machine.  Download requested. Has allergies, claritin is helping. Has some drainage and watery eyes.   Requested pataday refill.  No chest pain, wheezing , dyspnea or fever.    ROS-see HPI Constitutional:   No-   weight loss, night sweats, fevers, chills, fatigue, lassitude. HEENT:   No-  headaches, difficulty swallowing,  tooth/dental problems, sore throat,       No-  sneezing, itching, ear ache, nasal congestion, post nasal drip,  CV:  No-   chest pain, orthopnea, PND, anasarca,                                  dizziness, palpitations Resp: No-   shortness of breath with exertion or at rest.              No-   productive cough,  No non-productive cough,  No- coughing up of blood.              No-   change in color of mucus.  No- wheezing.   Skin: No-   rash or lesions. GI:  No-   heartburn, indigestion, abdominal pain, nausea, vomiting, GU: . MS:  No-   joint pain or swelling.  No- decreased range of motion.  No- back pain. Neuro-     nothing unusual Psych:  No- change in mood or affect. No depression or anxiety.  No memory loss.   OBJ General- Alert, Oriented, Affect-appropriate, Distress- none acute; looks well Skin- rash-none, lesions- none, excoriation- none Lymphadenopathy- none Head- atraumatic            Eyes- Gross vision intact, PERRLA, conjunctivae            Ears- Hearing, canals-normal            Nose- Clear, no-Septal dev, mucus, polyps, erosion, perforation             Throat- Mallampati II-III , mucosa clear , drainage- none, tonsils- atrophic Neck- flexible , trachea midline, no stridor ,  thyroid nl, carotid no bruit Chest - symmetrical excursion , unlabored           Heart/CV- RRR , no murmur , no gallop  , no rub, nl s1 s2                           - JVD- none , no edema-- none, varices- none           Lung- clear to P&A, wheeze- none, cough- none , dullness-none, rub- none           Chest wall-  Abd-  Br/ Gen/ Rectal- Not done, not indicated Extrem- cyanosis- none, clubbing, none, atrophy- none, strength- nl Neuro- grossly intact to observation

## 2014-12-23 NOTE — Addendum Note (Signed)
Addended by: Parke Poisson E on: 12/23/2014 12:07 PM   Modules accepted: Orders, Medications

## 2014-12-23 NOTE — Assessment & Plan Note (Signed)
Doing well on CPAP At bedtime    Plan  CPAP At bedtime   Follow up Dr. Annamaria Boots  In 1 year and As needed

## 2014-12-25 ENCOUNTER — Telehealth: Payer: Self-pay | Admitting: Family Medicine

## 2014-12-25 DIAGNOSIS — R1013 Epigastric pain: Secondary | ICD-10-CM

## 2014-12-25 NOTE — Telephone Encounter (Signed)
-----   Message from Jinny Sanders, MD sent at 12/18/2014 12:58 PM EDT ----- Look into virtual colonscopy

## 2014-12-25 NOTE — Telephone Encounter (Signed)
The cost is $865.00 and no insurance covers it. You put in [id number] and choose [facility] as the location and then the patient calls them to get scheduled directly.

## 2014-12-25 NOTE — Telephone Encounter (Signed)
[name] [name]., Thi s pt is interested in CT colonography ( ie virtual colonoscopy) but does not wish to go back to her GI MD to discuss. IS this something I can order? Have you done it before?

## 2014-12-27 NOTE — Telephone Encounter (Signed)
Please let the pt know the information below about the virtual colonoscopy.

## 2014-12-28 NOTE — Telephone Encounter (Signed)
[name] [name] notified as instructed by telephone.  She states she has done some research about the virtual colonoscopy and does not wish to go that route at this time.  She states when the time comes, she will just have the normal colonoscopy.

## 2014-12-29 NOTE — Addendum Note (Signed)
Addended by: Ellamae Sia on: 12/29/2014 03:53 PM   Modules accepted: Orders

## 2014-12-30 ENCOUNTER — Telehealth: Payer: Self-pay | Admitting: *Deleted

## 2014-12-30 LAB — HELICOBACTER PYLORI  SPECIAL ANTIGEN: H. PYLORI Antigen: NEGATIVE

## 2014-12-30 NOTE — Telephone Encounter (Signed)
Received PA request from [facility] for Celebrex 200 mg.  PA completed via telephone and approved through 08/2015.  Auth # [id number].

## 2015-01-05 ENCOUNTER — Telehealth: Payer: Self-pay | Admitting: Adult Health

## 2015-01-05 NOTE — Telephone Encounter (Signed)
Per JJ she has form and is awaiting signature from TP.  Called pt and made aware.  Will forward to JJ to document once done

## 2015-01-08 NOTE — Telephone Encounter (Signed)
Has this form been sent yet?    To [employer]

## 2015-01-08 NOTE — Telephone Encounter (Signed)
Yes, form was signed by TP and faxed on 5.5.16 to [facility] spoke with patient, she is aware Form sent for scan  Nothing further needed; will sign off.

## 2015-01-12 NOTE — Assessment & Plan Note (Signed)
Recommended return to GI for upper endoscopy versus upper gi etc. Pt refused at this time.  Will send for eval for Hpylori. Continue PPI and avoid GER triggers.

## 2015-01-12 NOTE — Assessment & Plan Note (Signed)
Treated empirically few weeks ago given typical pain and pt history. Pain returned.. Recommend eval for diverticulitis with abd CT.  pt requests digital colonoscopy.Marland Kitchen Spent significant time counseling pt on how this is not appropriate test to determine presence of ongoing diverticulitis.

## 2015-01-26 ENCOUNTER — Ambulatory Visit: Payer: Medicare Other | Admitting: Internal Medicine

## 2015-02-10 ENCOUNTER — Ambulatory Visit: Payer: Medicare Other | Admitting: Internal Medicine

## 2015-02-15 ENCOUNTER — Ambulatory Visit: Payer: Medicare Other | Admitting: Podiatry

## 2015-02-18 ENCOUNTER — Telehealth: Payer: Self-pay | Admitting: Adult Health

## 2015-02-18 DIAGNOSIS — G4733 Obstructive sleep apnea (adult) (pediatric): Secondary | ICD-10-CM

## 2015-02-18 NOTE — Telephone Encounter (Signed)
Order- [facility] DME change CPAP to auto 5-15 cwp    Dx OSA

## 2015-02-18 NOTE — Telephone Encounter (Signed)
Spoke with Larkin Ina, RT with [facility] whom was at the pt's home setting up her new CPAP machine Per pt's chart, she was on autotitration at the last ov w/ CY on 5.21.15: Patient Instructions       We can continue CPAP Autotitration   Ok to use Claritin as needed  Please call as needed   Per the last CMN scanned into pt's chart (signed by CY on 4.1.16) states pt was on 6cm H2O Pt saw TP for face-to-face for new machine on 4.20.16 - order was sent 'use last pressure settings' which was signed by CY as 6cm  Pt is requesting machine be set on auto as discussed at the 01/2014 ov w/ CY Please advise, thank you.

## 2015-02-19 NOTE — Telephone Encounter (Signed)
Pt aware of order being placed. Apologized for the delay in getting everything handled. Explained that I am not sure where the ball was dropped but that she should be set now. Advised her that if she hasnt heard anything by Tue/Wed next week to call us. Expressed appreciation and understanding. Nothing further needed.

## 2015-02-19 NOTE — Telephone Encounter (Signed)
Order entered to change CPAP to auto 5-15 cwp. Left message for patient to call back.

## 2015-02-19 NOTE — Telephone Encounter (Signed)
LMTCB x1 for pt.  

## 2015-02-19 NOTE — Telephone Encounter (Signed)
Pt returned call  [phone]

## 2015-02-25 ENCOUNTER — Other Ambulatory Visit: Payer: Self-pay | Admitting: Family Medicine

## 2015-02-25 NOTE — Telephone Encounter (Signed)
Last refilled 12/11/14 #60 with 0 refills

## 2015-02-26 NOTE — Telephone Encounter (Signed)
Prescription called to pharmacy.

## 2015-03-02 ENCOUNTER — Other Ambulatory Visit: Payer: Self-pay | Admitting: Family Medicine

## 2015-03-15 ENCOUNTER — Ambulatory Visit: Payer: Medicare Other

## 2015-03-19 ENCOUNTER — Other Ambulatory Visit: Payer: Self-pay | Admitting: Family Medicine

## 2015-04-12 ENCOUNTER — Telehealth: Payer: Self-pay | Admitting: *Deleted

## 2015-04-12 NOTE — Telephone Encounter (Signed)
Lmom to call our office. Time to schedule a carotid u/s (6 mth f/u). 

## 2015-04-13 ENCOUNTER — Other Ambulatory Visit: Payer: Self-pay | Admitting: Family Medicine

## 2015-04-14 ENCOUNTER — Encounter: Payer: Self-pay | Admitting: Internal Medicine

## 2015-04-19 ENCOUNTER — Ambulatory Visit: Payer: Medicare Other

## 2015-04-26 ENCOUNTER — Other Ambulatory Visit: Payer: Self-pay | Admitting: Family Medicine

## 2015-04-26 DIAGNOSIS — I6523 Occlusion and stenosis of bilateral carotid arteries: Secondary | ICD-10-CM

## 2015-04-29 ENCOUNTER — Other Ambulatory Visit: Payer: Self-pay | Admitting: Family Medicine

## 2015-04-29 ENCOUNTER — Ambulatory Visit (INDEPENDENT_AMBULATORY_CARE_PROVIDER_SITE_OTHER): Payer: Medicare Other

## 2015-04-29 DIAGNOSIS — I6523 Occlusion and stenosis of bilateral carotid arteries: Secondary | ICD-10-CM

## 2015-04-30 NOTE — Telephone Encounter (Signed)
Last office visit 12/18/2014.  Last refilled 02/26/2015 for #60 with no refills.  Ok to refill?

## 2015-04-30 NOTE — Telephone Encounter (Signed)
Called to [facility] [address] [place]. 

## 2015-06-02 ENCOUNTER — Encounter: Payer: Self-pay | Admitting: Podiatry

## 2015-06-02 ENCOUNTER — Ambulatory Visit (INDEPENDENT_AMBULATORY_CARE_PROVIDER_SITE_OTHER): Payer: Medicare Other | Admitting: Podiatry

## 2015-06-02 DIAGNOSIS — B351 Tinea unguium: Secondary | ICD-10-CM

## 2015-06-02 DIAGNOSIS — L600 Ingrowing nail: Secondary | ICD-10-CM

## 2015-06-02 DIAGNOSIS — M79673 Pain in unspecified foot: Secondary | ICD-10-CM

## 2015-06-02 NOTE — Progress Notes (Signed)
She presents today with chief complaint of painful ingrown toenails and thick mycotic nails bilaterally.  Objective: Vital signs are stable she's alert and oriented 3 pulses are strongly palpable. Nails are thick yellow dystrophic onychomycotic and sharply incurvated particularly the hallux bilateral.  Assessment: Pain in limb secondary to onychomycosis bilateral.  Plan: Debridement of all of her nails 1 through 5 bilateral is covered service secondary to pain. Follow up with her in 3 months.

## 2015-06-07 ENCOUNTER — Ambulatory Visit: Payer: Medicare Other

## 2015-06-15 ENCOUNTER — Telehealth: Payer: Self-pay | Admitting: Family Medicine

## 2015-06-15 ENCOUNTER — Other Ambulatory Visit (INDEPENDENT_AMBULATORY_CARE_PROVIDER_SITE_OTHER): Payer: Medicare Other

## 2015-06-15 DIAGNOSIS — E042 Nontoxic multinodular goiter: Secondary | ICD-10-CM

## 2015-06-15 DIAGNOSIS — E7800 Pure hypercholesterolemia, unspecified: Secondary | ICD-10-CM

## 2015-06-15 LAB — COMPREHENSIVE METABOLIC PANEL
ALT: 19 U/L (ref 0–35)
AST: 17 U/L (ref 0–37)
Albumin: 4.5 g/dL (ref 3.5–5.2)
Alkaline Phosphatase: 68 U/L (ref 39–117)
BUN: 19 mg/dL (ref 6–23)
CALCIUM: 9.9 mg/dL (ref 8.4–10.5)
CO2: 26 meq/L (ref 19–32)
Chloride: 100 mEq/L (ref 96–112)
Creatinine, Ser: 0.85 mg/dL (ref 0.40–1.20)
GFR: 69.15 mL/min (ref >60.00)
Glucose, Bld: 98 mg/dL (ref 70–99)
POTASSIUM: 4.3 meq/L (ref 3.5–5.1)
Sodium: 136 mEq/L (ref 135–145)
Total Bilirubin: 0.5 mg/dL (ref 0.2–1.2)
Total Protein: 7.4 g/dL (ref 6.0–8.3)

## 2015-06-15 LAB — LIPID PANEL
CHOL/HDL RATIO: 2
Cholesterol: 152 mg/dL (ref 0–200)
HDL: 64.9 mg/dL (ref >39.00)
LDL Cholesterol: 64 mg/dL (ref 0–99)
NonHDL: 86.87
TRIGLYCERIDES: 115 mg/dL (ref 0.0–149.0)
VLDL: 23 mg/dL (ref 0.0–40.0)

## 2015-06-15 LAB — TSH: TSH: 1.13 u[IU]/mL (ref 0.35–4.50)

## 2015-06-15 NOTE — Telephone Encounter (Signed)
-----   Message from Ellamae Sia sent at 06/09/2015  5:10 PM EDT ----- Regarding: Lab orders for Tuesday, 10.11.16 Lab orders for a 6 month follow up appt

## 2015-06-22 ENCOUNTER — Ambulatory Visit (INDEPENDENT_AMBULATORY_CARE_PROVIDER_SITE_OTHER): Payer: Medicare Other | Admitting: Family Medicine

## 2015-06-22 ENCOUNTER — Encounter: Payer: Self-pay | Admitting: Family Medicine

## 2015-06-22 VITALS — BP 142/78 | HR 74 | Temp 97.9°F | Ht 63.0 in | Wt 165.2 lb

## 2015-06-22 DIAGNOSIS — E042 Nontoxic multinodular goiter: Secondary | ICD-10-CM

## 2015-06-22 DIAGNOSIS — I6523 Occlusion and stenosis of bilateral carotid arteries: Secondary | ICD-10-CM

## 2015-06-22 DIAGNOSIS — I10 Essential (primary) hypertension: Secondary | ICD-10-CM | POA: Diagnosis not present

## 2015-06-22 DIAGNOSIS — E7800 Pure hypercholesterolemia, unspecified: Secondary | ICD-10-CM

## 2015-06-22 NOTE — Progress Notes (Signed)
Pre visit review using our clinic review tool, if applicable. No additional management support is needed unless otherwise documented below in the visit note. 

## 2015-06-22 NOTE — Assessment & Plan Note (Signed)
Well controlled at home, pt upset prior to coming to office. Continue current medication.

## 2015-06-22 NOTE — Patient Instructions (Signed)
Keep up the great work on healthy eating and weight loss as well as regular exercise.

## 2015-06-22 NOTE — Assessment & Plan Note (Signed)
Excellent improvement in cholesterol with joining weight watchers!

## 2015-06-22 NOTE — Progress Notes (Signed)
Subjective:    Patient ID: [name] [name], female    DOB: 01/16/1941, 74 y.o.   MRN: [id number]  HPI   74 year old female presents for 6 month follow up cholesterol.  Hypertension: Well controlled  Previously on current regimen.  At goal BP at home. BP Readings from Last 3 Encounters:  06/22/15 142/78  12/23/14 132/80  12/18/14 120/74  Using medication without problems or lightheadedness: None  Chest pain with exertion: one  Edema: intermittent, but better than previously. Short of breath:None  Average home BPs: at goal at home. Other issues:   Elevated Cholesterol: 4 tabs daily of red yeast rice.Marland Kitchen LDL NOW at goal <70.  Lab Results  Component Value Date   CHOL 152 06/15/2015   HDL 64.90 06/15/2015   LDLCALC 64 06/15/2015   LDLDIRECT 114.4 06/10/2013   TRIG 115.0 06/15/2015   CHOLHDL 2 06/15/2015   SE to statins.  Using medications without problems:  Muscle aches: None  Diet compliance: She has joined weight watcher in last 6 months.. Exercise:[employer], two times a week. Other complaints: right carotid artery stenosis  Wt Readings from Last 3 Encounters:  06/22/15 165 lb 4 oz (74.957 kg)  12/23/14 166 lb 9.6 oz (75.569 kg)  12/18/14 167 lb (75.751 kg)   Body mass index is 29.28 kg/(m^2).   Thyroid Nodules: Muliptle thyroid nodules, followed by ENDO. BX showed no malignancy. TSH stable recent check..   Continues to use valium regularly at bedtime. Sleeping better at night.  Using back brace as needed.  No falls, no sedation with this med.                  Review of Systems  Constitutional: Negative for fever and fatigue.  HENT: Negative for ear pain.   Eyes: Negative for pain.  Respiratory: Negative for chest tightness and shortness of breath.   Cardiovascular: Negative for chest pain, palpitations and leg swelling.  Gastrointestinal: Negative for abdominal pain.  Genitourinary: Negative for dysuria.       Objective:   Physical Exam    Constitutional: Vital signs are normal. She appears well-developed and well-nourished. She is cooperative.  Non-toxic appearance. She does not appear ill. No distress.  HENT:  Head: Normocephalic.  Right Ear: Hearing, tympanic membrane, external ear and ear canal normal. Tympanic membrane is not erythematous, not retracted and not bulging.  Left Ear: Hearing, tympanic membrane, external ear and ear canal normal. Tympanic membrane is not erythematous, not retracted and not bulging.  Nose: No mucosal edema or rhinorrhea. Right sinus exhibits no maxillary sinus tenderness and no frontal sinus tenderness. Left sinus exhibits no maxillary sinus tenderness and no frontal sinus tenderness.  Mouth/Throat: Uvula is midline, oropharynx is clear and moist and mucous membranes are normal.  Eyes: Conjunctivae, EOM and lids are normal. Pupils are equal, round, and reactive to light. Lids are everted and swept, no foreign bodies found.  Neck: Trachea normal and normal range of motion. Neck supple. Carotid bruit is not present. No thyroid mass and no thyromegaly present.  Cardiovascular: Normal rate, regular rhythm, S1 normal, S2 normal, normal heart sounds, intact distal pulses and normal pulses.  Exam reveals no gallop and no friction rub.   No murmur heard. Pulmonary/Chest: Effort normal and breath sounds normal. No tachypnea. No respiratory distress. She has no decreased breath sounds. She has no wheezes. She has no rhonchi. She has no rales.  Abdominal: Soft. Normal appearance and bowel sounds are normal. There is no tenderness.  Neurological: She is alert.  Skin: Skin is warm, dry and intact. No rash noted.  Psychiatric: Her speech is normal and behavior is normal. Judgment and thought content normal. Her mood appears not anxious. Cognition and memory are normal. She does not exhibit a depressed mood.          Assessment & Plan:

## 2015-06-22 NOTE — Assessment & Plan Note (Signed)
Stable TSH

## 2015-06-28 ENCOUNTER — Ambulatory Visit: Payer: Medicare Other

## 2015-06-28 ENCOUNTER — Other Ambulatory Visit: Payer: Self-pay | Admitting: Family Medicine

## 2015-06-28 NOTE — Telephone Encounter (Signed)
Last office visit 06/22/2015.  Last refilled 04/30/2015 for #60 with no refills.  Ok to refill?

## 2015-06-29 NOTE — Telephone Encounter (Signed)
Valium called into [facility] [address].

## 2015-07-01 ENCOUNTER — Ambulatory Visit: Payer: Medicare Other

## 2015-08-02 ENCOUNTER — Encounter: Payer: Self-pay | Admitting: Family Medicine

## 2015-08-02 ENCOUNTER — Telehealth: Payer: Self-pay | Admitting: Family Medicine

## 2015-08-02 ENCOUNTER — Ambulatory Visit (INDEPENDENT_AMBULATORY_CARE_PROVIDER_SITE_OTHER): Payer: Medicare Other | Admitting: Family Medicine

## 2015-08-02 VITALS — BP 150/92 | HR 78 | Temp 97.4°F | Wt 160.2 lb

## 2015-08-02 DIAGNOSIS — I6523 Occlusion and stenosis of bilateral carotid arteries: Secondary | ICD-10-CM | POA: Diagnosis not present

## 2015-08-02 DIAGNOSIS — T881XXA Other complications following immunization, not elsewhere classified, initial encounter: Secondary | ICD-10-CM | POA: Diagnosis not present

## 2015-08-02 NOTE — Progress Notes (Signed)
Pre visit review using our clinic review tool, if applicable. No additional management support is needed unless otherwise documented below in the visit note. 

## 2015-08-02 NOTE — Assessment & Plan Note (Signed)
New- reassurance provided. Resolving. Advised prn NSAIDs/tylenol. Call or return to clinic prn if these symptoms worsen or fail to improve as anticipated. The patient indicates understanding of these issues and agrees with the plan.

## 2015-08-02 NOTE — Patient Instructions (Signed)
Great to meet you. This is a localized reaction. Ibuprofen, alleve, or tylenol as needed. Please keep [place] updated.

## 2015-08-02 NOTE — Telephone Encounter (Signed)
[place] [facility]  Patient Name: EIMAAN HASLER  DOB: [birth year]    Initial Comment [name] states had a flu shot; now red around cite; arm is sore; left arm   Nurse Assessment  Nurse: Wynetta Emery, RN, Baker Janus Date/Time Eilene Ghazi Time): 08/02/2015 9:04:44 AM  Confirm and document reason for call. If symptomatic, describe symptoms. ---Francesca Jewett received her flu shot on Friday in left upper arm and injection site is red -- bright red on Saturday and spread to inner arm Yesterday -- faded to paler red not firey red like Saturday and arm is sore.  Has the patient traveled out of the country within the last 30 days? ---No  Does the patient have any new or worsening symptoms? ---Yes  Will a triage be completed? ---Yes  Related visit to physician within the last 2 weeks? ---No  Does the PT have any chronic conditions? (i.e. diabetes, asthma, etc.) ---No  Is this a behavioral health call? ---No     Guidelines    Guideline Title Affirmed Question Affirmed Notes  Immunization Reactions [1] Redness or red streak around the injection site AND [2] begins > 48 hours after shot AND [3] no fever (Exception: red area < 1 inch or 2.5 cm wide)    Final Disposition User   See Physician within [place], RN, Baker Janus    Comments  NOTE: appt 07-31-2015 145pm Diona Browner, Amy immunization reaction at site   Referrals  REFERRED TO PCP OFFICE   Disagree/Comply: Comply

## 2015-08-02 NOTE — Telephone Encounter (Signed)
Appointment with Dr. Deborra Medina today at 3:00 pm.

## 2015-08-02 NOTE — Progress Notes (Signed)
Subjective:   Patient ID: [name] [name], female    DOB: 1941/08/30, 74 y.o.   MRN: [id number]  [name] [name] is a pleasant 74 y.o. year old female who presents to clinic today with Arm Pain  on 08/02/2015  HPI: ? Reaction to flu shot- Received her influenza vaccine 3 days ago in left upper arm. Shortly after receiving it, injection site became "bright red" and spread to inner arm.  Yesterday lighter in color but still present.  Arm is sore.  No fevers or chills.  Never had a reaction like this before when receiving any type of immunization.  Area is getting smaller.  No difficulty breathing or wheezing.  Has not felt like her throat is closing shut. Current Outpatient Prescriptions on File Prior to Visit  Medication Sig Dispense Refill  . aspirin 81 MG tablet Take 81 mg by mouth daily.      . CELEBREX 200 MG capsule TAKE 1 CAPSULE BY MOUTH TWICE DAILY 180 capsule 1  . Cream Base (PHYTOBASE) CREA Apply topically daily.      Marland Kitchen DIOVAN 80 MG tablet TAKE 1 TABLET BY MOUTH EVERY DAY 30 tablet 5  . estradiol (VIVELLE-DOT) 0.0375 MG/24HR Place 1 patch onto the skin 2 (two) times a week.    . loratadine (CLARITIN) 10 MG tablet Take 10 mg by mouth daily.    . NORVASC 10 MG tablet TAKE 1 TABLET BY MOUTH EVERY DAY 30 tablet 5  . Olopatadine HCl (PATADAY) 0.2 % SOLN Place 1 drop into both eyes daily. 2.5 mL 5  . pantoprazole (PROTONIX) 40 MG tablet TAKE 1 TABLET BY MOUTH TWICE DAILY 180 tablet 1  . Red Yeast Rice 600 MG CAPS Take 4 capsules by mouth daily.     Marland Kitchen VALIUM 2 MG tablet TAKE 1/2 TO 1 TABLET BY MOUTH EVERY 12 HOURS AS NEEDED 60 tablet 0   No current facility-administered medications on file prior to visit.    Allergies  Allergen Reactions  . Ace Inhibitors     REACTION: swelling  . Beta Adrenergic Blockers     REACTION: swelling  . Statins     REACTION: myalgia  . Tramadol     Does not want to take again.   Constipation, decreased appetite, hot/chills, did not do well  with it    Past Medical History  Diagnosis Date  . Hemorrhoids, internal, with bleeding   . Diverticulitis   . Vertigo   . Chronic lower back pain   . Scoliosis   . Vaginitis, atrophic   . OSA (obstructive sleep apnea)   . PUD (peptic ulcer disease)   . Arthritis   . GERD (gastroesophageal reflux disease)   . Hiatal hernia   . HLD (hyperlipidemia)   . Hypertension   . History of colon polyps   . Thyroid disease     hypo  . Thyroid nodule     Past Surgical History  Procedure Laterality Date  . Cholecystectomy      2004  . Ankle fusion  1967    LEFT  . Colonoscopy  06/22/2011    diverticulosis (no adenomas since 2001)  . Fracture surgery Left 1967    foot and ankle surgery    Family History  Problem Relation Age of Onset  . Cancer Mother     colon   . Colon cancer Mother 76  . Heart disease Mother   . Hypertension Mother   . Heart disease Father     cad  .  Deep vein thrombosis Father   . Heart attack Father   . Stomach cancer Sister   . Cancer Sister   . Diabetes Sister   . Heart disease Sister   . Hyperlipidemia Sister   . Hypertension Sister   . Diabetes Brother   . Heart disease Brother     Heart Disease before age 46  . Hyperlipidemia Brother   . Hypertension Brother   . Cancer Sister   . Heart disease Brother   . Heart attack Brother   . Heart disease Brother   . Heart disease Brother     Social History   Social History  . Marital Status: Married    Spouse Name: N/A  . Number of Children: 1  . Years of Education: N/A   Occupational History  .     Social History Main Topics  . Smoking status: Never Smoker   . Smokeless tobacco: Never Used  . Alcohol Use: Yes     Comment: wine occassionally  . Drug Use: No  . Sexual Activity: Not on file   Other Topics Concern  . Not on file   Social History Narrative   [name] [name] [place], has living will, full code ( reviewed 2015)   The PMH, PSH, Social History, Family History, Medications,  and allergies have been reviewed in [facility], and have been updated if relevant.   Review of Systems  Constitutional: Negative for fever and fatigue.  HENT: Negative.   Respiratory: Negative.   Cardiovascular: Negative.   Musculoskeletal: Positive for myalgias.  Skin: Positive for color change and rash.  All other systems reviewed and are negative.      Objective:    BP 150/92 mmHg  Pulse 78  Temp(Src) 97.4 F (36.3 C) (Oral)  Wt 160 lb 4 oz (72.689 kg)  SpO2 90%   Physical Exam  Constitutional: She is oriented to person, place, and time. She appears well-developed and well-nourished. No distress.  HENT:  Head: Normocephalic.  Eyes: Conjunctivae are normal.  Cardiovascular: Normal rate.   Pulmonary/Chest: Effort normal.  Musculoskeletal: Normal range of motion.  Neurological: She is alert and oriented to person, place, and time. No cranial nerve deficit.  Skin:     Psychiatric: She has a normal mood and affect. Her behavior is normal. Judgment and thought content normal.  Nursing note and vitals reviewed.         Assessment & Plan:   Local reaction to immunization, initial encounter No Follow-up on file.

## 2015-08-03 ENCOUNTER — Ambulatory Visit: Payer: Self-pay | Admitting: Family Medicine

## 2015-08-10 ENCOUNTER — Other Ambulatory Visit: Payer: Self-pay | Admitting: Family Medicine

## 2015-08-10 NOTE — Telephone Encounter (Signed)
Valium called into [facility] [address].

## 2015-08-10 NOTE — Telephone Encounter (Signed)
Last office visit 08/02/2015 with Dr. Deborra Medina.  Last refilled 06/29/2015 for #60 with no refills.  Ok to refill?

## 2015-09-08 ENCOUNTER — Ambulatory Visit: Payer: Medicare Other | Admitting: Podiatry

## 2015-09-17 ENCOUNTER — Ambulatory Visit (INDEPENDENT_AMBULATORY_CARE_PROVIDER_SITE_OTHER): Payer: Medicare Other | Admitting: Family Medicine

## 2015-09-17 ENCOUNTER — Encounter: Payer: Self-pay | Admitting: Family Medicine

## 2015-09-17 VITALS — BP 148/72 | HR 72 | Temp 97.6°F | Ht 63.0 in | Wt 164.5 lb

## 2015-09-17 DIAGNOSIS — L820 Inflamed seborrheic keratosis: Secondary | ICD-10-CM | POA: Diagnosis not present

## 2015-09-17 DIAGNOSIS — J069 Acute upper respiratory infection, unspecified: Secondary | ICD-10-CM | POA: Insufficient documentation

## 2015-09-17 DIAGNOSIS — B9789 Other viral agents as the cause of diseases classified elsewhere: Principal | ICD-10-CM

## 2015-09-17 MED ORDER — NORVASC 10 MG PO TABS: 10.0000 mg | ORAL_TABLET | Freq: Every day | ORAL | Status: DC

## 2015-09-17 MED ORDER — CELECOXIB 200 MG PO CAPS: 200.0000 mg | ORAL_CAPSULE | Freq: Two times a day (BID) | ORAL | Status: DC

## 2015-09-17 MED ORDER — DIOVAN 80 MG PO TABS: 80.0000 mg | ORAL_TABLET | Freq: Every day | ORAL | Status: DC

## 2015-09-17 MED ORDER — GUAIFENESIN-CODEINE 100-10 MG/5ML PO SYRP: 5.0000 mL | ORAL_SOLUTION | Freq: Every evening | ORAL | Status: DC | PRN

## 2015-09-17 NOTE — Patient Instructions (Addendum)
Keep CPAP mask off the area with pad/shirt. Can use Hydrocortisone OTC cream twice daily 1-2 weeks.  Call if interested in freezing areas off with liquid nitrogen.  Seborrheic Keratosis Seborrheic keratosis is a common, noncancerous (benign) skin growth. This condition causes waxy, rough, tan, brown, or black spots to appear on the skin. These skin growths can be flat or raised. CAUSES The cause of this condition is not known. RISK FACTORS This condition is more likely to develop in:  People who have a family history of seborrheic keratosis.  People who are 17 or older.  People who are pregnant.  People who have had estrogen replacement therapy. SYMPTOMS This condition often occurs on the face, chest, shoulders, back, or other areas. These growths:  Are usually painless, but may become irritated and itchy.  Can be yellow, brown, black, or other colors.  Are slightly raised or have a flat surface.  Are sometimes rough or wart-like in texture.  Are often waxy on the surface.  Are round or oval-shaped.  Sometimes look like they are "stuck on."  Often occur in groups, but may occur as a single growth. DIAGNOSIS This condition is diagnosed with a medical history and physical exam. A sample of the growth may be tested (skin biopsy). You may need to see a skin specialist (dermatologist). TREATMENT Treatment is not usually needed for this condition, unless the growths are irritated or are often bleeding. You may also choose to have the growths removed if you do not like their appearance. Most commonly, these growths are treated with a procedure in which liquid nitrogen is applied to "freeze" off the growth (cryosurgery). They may also be burned off with electricity or cut off. HOME CARE INSTRUCTIONS  Watch your growth for any changes.  Keep all follow-up visits as told by your health care provider. This is important.  Do not scratch or pick at the growth or growths. This can  cause them to become irritated or infected. SEEK MEDICAL CARE IF:  You suddenly have many new growths.  Your growth bleeds, itches, or hurts.  Your growth suddenly becomes larger or changes color.   This information is not intended to replace advice given to you by your health care provider. Make sure you discuss any questions you have with your health care provider.   Document Released: 09/23/2010 Document Revised: 05/12/2015 Document Reviewed: 01/06/2015 Elsevier Interactive Patient Education [employer].

## 2015-09-17 NOTE — Progress Notes (Signed)
Pre visit review using our clinic review tool, if applicable. No additional management support is needed unless otherwise documented below in the visit note. 

## 2015-09-17 NOTE — Assessment & Plan Note (Signed)
Pt reassured. Treat with steroid cream, pads for CPAP to avoid further irritation.

## 2015-09-17 NOTE — Assessment & Plan Note (Signed)
Symptomatic care. Filled rx for cough suppressant.

## 2015-09-17 NOTE — Progress Notes (Signed)
   Subjective:    Patient ID: [name] [name], female    DOB: [birth year], 75 y.o.   MRN: [id number]  HPI   75 year old female with sleep apnea on CPAP presents with skin lesion on face.Marland Kitchen She wonders if it is from the pressure of the CPAP mask. She first noted the lesion  In fall about 4 months ago. Started red, more swollen, rough patch on left lateral cheek.  Occ itchy. No pain.  Now in last few months it has been less raised, no redness.  Respiratory therapist feels that the area may be due to have pressure and skin thickening from mask ands skin thickening at nose piece. He has now adjusted the mask.  She has now put a pad around nose and a piece of tshirt on left sode of mouth wear under mask.  No fever. She has a cough at night. Viral symptoms.  She is requesting cough syrup to use at night.  Social History /Family History/Past Medical History reviewed and updated if needed.  Body mass index is 29.15 kg/(m^2). Wt Readings from Last 3 Encounters:  09/17/15 164 lb 8 oz (74.617 kg)  08/02/15 160 lb 4 oz (72.689 kg)  06/22/15 165 lb 4 oz (74.957 kg)    Review of Systems  Constitutional: Negative for fever and fatigue.  HENT: Negative for ear pain.   Eyes: Negative for pain.  Respiratory: Negative for chest tightness and shortness of breath.   Cardiovascular: Negative for chest pain, palpitations and leg swelling.  Gastrointestinal: Negative for abdominal pain.  Genitourinary: Negative for dysuria.       Objective:   Physical Exam  Constitutional: Vital signs are normal. She appears well-developed and well-nourished. She is cooperative.  Non-toxic appearance. She does not appear ill. No distress.  HENT:  Head: Normocephalic.  Right Ear: Hearing, tympanic membrane, external ear and ear canal normal. Tympanic membrane is not erythematous, not retracted and not bulging.  Left Ear: Hearing, tympanic membrane, external ear and ear canal normal. Tympanic membrane is not  erythematous, not retracted and not bulging.  Nose: Mucosal edema and rhinorrhea present. Right sinus exhibits no maxillary sinus tenderness and no frontal sinus tenderness. Left sinus exhibits no maxillary sinus tenderness and no frontal sinus tenderness.  Mouth/Throat: Uvula is midline, oropharynx is clear and moist and mucous membranes are normal.  Eyes: Conjunctivae, EOM and lids are normal. Pupils are equal, round, and reactive to light. Lids are everted and swept, no foreign bodies found.  Neck: Trachea normal and normal range of motion. Neck supple. Carotid bruit is not present. No thyroid mass and no thyromegaly present.  Cardiovascular: Normal rate, regular rhythm, S1 normal, S2 normal, normal heart sounds, intact distal pulses and normal pulses.  Exam reveals no gallop and no friction rub.   No murmur heard. Pulmonary/Chest: Effort normal and breath sounds normal. No tachypnea. No respiratory distress. She has no decreased breath sounds. She has no wheezes. She has no rhonchi. She has no rales.  Neurological: She is alert.  Skin: Skin is warm, dry and intact. No rash noted.  2 irritate sks on left face  Psychiatric: Her speech is normal and behavior is normal. Judgment normal. Her mood appears not anxious. Cognition and memory are normal. She does not exhibit a depressed mood.          Assessment & Plan:

## 2015-09-21 ENCOUNTER — Telehealth: Payer: Self-pay | Admitting: *Deleted

## 2015-09-21 NOTE — Telephone Encounter (Signed)
Morine at [employer] is calling to check on the status of a PA for Diovan.  Ms Boling has taken her last pill today.  Please call pharmacy at [phone] and patient at [phone] once approved or denied.

## 2015-09-21 NOTE — Telephone Encounter (Signed)
PA Completed on CoverMyMeds.  Awaiting decision.

## 2015-09-21 NOTE — Telephone Encounter (Signed)
I have not received a prior authorization request from pharmacy so no PA has been started.  Called [facility] and request that they fax over PA request again.

## 2015-09-22 ENCOUNTER — Ambulatory Visit (INDEPENDENT_AMBULATORY_CARE_PROVIDER_SITE_OTHER): Payer: Medicare Other | Admitting: Podiatry

## 2015-09-22 ENCOUNTER — Encounter: Payer: Self-pay | Admitting: Podiatry

## 2015-09-22 DIAGNOSIS — B351 Tinea unguium: Secondary | ICD-10-CM

## 2015-09-22 DIAGNOSIS — M79676 Pain in unspecified toe(s): Secondary | ICD-10-CM

## 2015-09-22 NOTE — Progress Notes (Signed)
She presents today with a chief complaint of painful elongated toenails bilateral.  Objective: Vital signs are stable she is alert and oriented 3. Pulses are strongly palpable bilateral. Neurologic sensorium is intact per Semmes-Weinstein monofilament. Toenails are thick yellow dystrophic clinic with mycotic and painful on palpation.  Assessment: Pain in limb secondary to onychomycosis 1 through 5 bilateral.  Plan: Debridement of toenails 1 through 5 bilateral covered service secondary to pain follow up with [place] in 3 months.

## 2015-09-23 NOTE — Telephone Encounter (Signed)
Patient called and asked for Butch Penny to call her about the PA.  Please call patient at [phone].

## 2015-09-23 NOTE — Telephone Encounter (Signed)
Spoke with [name] [name].  [employer] denied her Diovan PA.  She ask that we call and proceed to an appeal.   I was on the phone with [facility] [facility] for over 20 minutes and was just getting transferred from person to person.  I hung up and completed for appeal paperwork and faxed in to [employer] at [phone].  On fax I ask that they expedite appeal due to patient will be out of her Diovan on Monday 09/26/2014.

## 2015-09-24 ENCOUNTER — Telehealth: Payer: Self-pay | Admitting: Family Medicine

## 2015-09-24 NOTE — Telephone Encounter (Signed)
Pt called she needs new rx for divan 80mg .  She stated she needs a hand written rx [facility]  She can get a discount there She only has enough meds to last through Sunday  Please let pt know what she needs to do She has several questions about rx

## 2015-09-24 NOTE — Telephone Encounter (Signed)
Called to try and expedite the Diovan appeal.  They are still saying it can take up to 72 hours for a decision.  Spoke with Mrs. Weyrauch.  She has spoken with pharmacist and they did go ahead a fill a prescription for Valsartan to have on hand until we get a decision from the Appeal.

## 2015-09-24 NOTE — Telephone Encounter (Signed)
Called to try and expedite the Diovan appeal.  They are still saying it can take up to 72 hours for a decision.  Spoke with [name] [name].  She has spoken with pharmacist and they did go ahead a fill a prescription for Valsartan to have on hand until we get a decision from the Appeal.

## 2015-09-24 NOTE — Telephone Encounter (Signed)
Please call pt she has several questions and doesn't know what to do She would like a call today

## 2015-09-24 NOTE — Telephone Encounter (Signed)
Pt is concerned; she will be out of diovan on 09/26/15; pt does not know whether to pay for diovan out of pocket;try valsartan again or try Benicar as possible substitute. Pt has a different # to expedite the appeals request. [phone]. Pt request cb today.

## 2015-09-27 ENCOUNTER — Telehealth: Payer: Self-pay | Admitting: Family Medicine

## 2015-09-27 NOTE — Telephone Encounter (Signed)
I think that should be Diovan.  Noted.

## 2015-09-27 NOTE — Telephone Encounter (Signed)
Pt called stating medication Vivon was approved. No return call necessary.

## 2015-09-28 ENCOUNTER — Ambulatory Visit: Payer: Medicare Other | Admitting: Family Medicine

## 2015-09-28 NOTE — Telephone Encounter (Signed)
Appeal for Diovan approved.  Patient is aware.

## 2015-10-04 ENCOUNTER — Other Ambulatory Visit: Payer: Self-pay | Admitting: Family Medicine

## 2015-10-04 NOTE — Telephone Encounter (Signed)
Valium called into [facility] [address].

## 2015-10-04 NOTE — Telephone Encounter (Signed)
Last office visit 09/17/2015.  Last refilled 08/10/2015 for #60 with no refills.  Ok to refill?

## 2015-11-29 ENCOUNTER — Other Ambulatory Visit: Payer: Self-pay | Admitting: Family Medicine

## 2015-11-29 NOTE — Telephone Encounter (Signed)
Last office visit 09/17/2015.  Last refilled 10/04/2015 for #60 with no refills.  Ok to refill?

## 2015-11-30 NOTE — Telephone Encounter (Signed)
Valium called into [facility] [address].

## 2015-12-14 ENCOUNTER — Other Ambulatory Visit: Payer: Self-pay | Admitting: Family Medicine

## 2015-12-16 ENCOUNTER — Telehealth: Payer: Self-pay | Admitting: Family Medicine

## 2015-12-16 NOTE — Telephone Encounter (Signed)
Appointment 4/21 @ 10:30 Pt aware

## 2015-12-16 NOTE — Telephone Encounter (Signed)
LM for pt to sch AWV 4/21 at 8:15, in addition to her already scheduled lab appt. mn

## 2015-12-21 ENCOUNTER — Other Ambulatory Visit: Payer: Medicare Other

## 2015-12-22 ENCOUNTER — Ambulatory Visit (INDEPENDENT_AMBULATORY_CARE_PROVIDER_SITE_OTHER): Payer: Medicare Other | Admitting: Podiatry

## 2015-12-22 ENCOUNTER — Encounter: Payer: Self-pay | Admitting: Podiatry

## 2015-12-22 DIAGNOSIS — B351 Tinea unguium: Secondary | ICD-10-CM | POA: Diagnosis not present

## 2015-12-22 DIAGNOSIS — M79673 Pain in unspecified foot: Secondary | ICD-10-CM

## 2015-12-22 DIAGNOSIS — M79676 Pain in unspecified toe(s): Secondary | ICD-10-CM

## 2015-12-23 ENCOUNTER — Telehealth: Payer: Self-pay | Admitting: Family Medicine

## 2015-12-23 ENCOUNTER — Other Ambulatory Visit (INDEPENDENT_AMBULATORY_CARE_PROVIDER_SITE_OTHER): Payer: Medicare Other

## 2015-12-23 ENCOUNTER — Ambulatory Visit: Payer: Medicare Other | Admitting: Internal Medicine

## 2015-12-23 DIAGNOSIS — E7800 Pure hypercholesterolemia, unspecified: Secondary | ICD-10-CM | POA: Diagnosis not present

## 2015-12-23 DIAGNOSIS — E042 Nontoxic multinodular goiter: Secondary | ICD-10-CM

## 2015-12-23 LAB — COMPREHENSIVE METABOLIC PANEL
ALBUMIN: 4.5 g/dL (ref 3.5–5.2)
ALT: 18 U/L (ref 0–35)
AST: 16 U/L (ref 0–37)
Alkaline Phosphatase: 86 U/L (ref 39–117)
BUN: 27 mg/dL — ABNORMAL HIGH (ref 6–23)
CALCIUM: 9.9 mg/dL (ref 8.4–10.5)
CHLORIDE: 100 meq/L (ref 96–112)
CO2: 29 mEq/L (ref 19–32)
Creatinine, Ser: 0.79 mg/dL (ref 0.40–1.20)
GFR: 75.14 mL/min (ref >60.00)
Glucose, Bld: 102 mg/dL — ABNORMAL HIGH (ref 70–99)
POTASSIUM: 4.2 meq/L (ref 3.5–5.1)
SODIUM: 136 meq/L (ref 135–145)
Total Bilirubin: 0.6 mg/dL (ref 0.2–1.2)
Total Protein: 7.4 g/dL (ref 6.0–8.3)

## 2015-12-23 LAB — LIPID PANEL
CHOLESTEROL: 172 mg/dL (ref 0–200)
HDL: 67.5 mg/dL (ref >39.00)
LDL CALC: 86 mg/dL (ref 0–99)
NonHDL: 104.98
TRIGLYCERIDES: 97 mg/dL (ref 0.0–149.0)
Total CHOL/HDL Ratio: 3
VLDL: 19.4 mg/dL (ref 0.0–40.0)

## 2015-12-23 NOTE — Progress Notes (Signed)
She presents today with chief complaint of painful elongated toenails.  Objective: vital signs are stable she is alert and oriented 3. Pulses are palpable. Toenails are thick yellow dystrophic onychomycotic and painful on palpation.  Assessment:  Pain in limb secondary to onychomycosis 1 through 5 bilateral.  Plan: debridement of toenails 1 through 5 bilateral.

## 2015-12-23 NOTE — Telephone Encounter (Signed)
-----   Message from Ellamae Sia sent at 12/22/2015  3:07 PM EDT ----- Regarding: Lab orders for Thursday,4.20.17 Patient is scheduled for CPX labs, please order future labs, Thanks , Terri

## 2015-12-24 ENCOUNTER — Ambulatory Visit: Payer: Medicare Other

## 2015-12-24 ENCOUNTER — Other Ambulatory Visit: Payer: Medicare Other

## 2015-12-28 ENCOUNTER — Ambulatory Visit (INDEPENDENT_AMBULATORY_CARE_PROVIDER_SITE_OTHER): Payer: Medicare Other | Admitting: Family Medicine

## 2015-12-28 ENCOUNTER — Encounter: Payer: Self-pay | Admitting: Family Medicine

## 2015-12-28 VITALS — BP 146/67 | HR 70 | Temp 97.4°F | Ht 64.0 in | Wt 161.5 lb

## 2015-12-28 DIAGNOSIS — Z0000 Encounter for general adult medical examination without abnormal findings: Secondary | ICD-10-CM | POA: Diagnosis not present

## 2015-12-28 DIAGNOSIS — Z7189 Other specified counseling: Secondary | ICD-10-CM

## 2015-12-28 DIAGNOSIS — I10 Essential (primary) hypertension: Secondary | ICD-10-CM | POA: Diagnosis not present

## 2015-12-28 DIAGNOSIS — I6521 Occlusion and stenosis of right carotid artery: Secondary | ICD-10-CM

## 2015-12-28 DIAGNOSIS — E7800 Pure hypercholesterolemia, unspecified: Secondary | ICD-10-CM | POA: Diagnosis not present

## 2015-12-28 NOTE — Progress Notes (Signed)
Pre visit review using our clinic review tool, if applicable. No additional management support is needed unless otherwise documented below in the visit note. 

## 2015-12-28 NOTE — Progress Notes (Signed)
I have personally reviewed the Medicare Annual Wellness questionnaire and have noted  1. The patient's medical and social history  2. Their use of alcohol, tobacco or illicit drugs  3. Their current medications and supplements  4. The patient's functional ability including ADL's, fall risks, home safety risks and hearing or visual  impairment.  5. Diet and physical activities  6. Evidence for depression or mood disorders  7. List of providers reviewed and documented. The patients weight, height, BMI and visual acuity have been recorded in the chart  I have made referrals, counseling and provided education to the patient based review of the above and I have provided the pt with a written personalized care plan for preventive services.  Hypertension: Well controlled on current regimen, slightly above goal. BP Readings from Last 3 Encounters:  12/28/15 146/67  09/17/15 148/72  08/02/15 150/92  Using medication without problems or lightheadedness: None  Chest pain with exertion:None  Edema: intermittent , but better than previously. Short of breath:None  Average home BPs: not checking lately.  Other issues:   Elevated Cholesterol: 4 tabs daily of red yeast rice.Marland Kitchen LDL NOT at goal <70 with carotid stenosis. Lab Results  Component Value Date   CHOL 172 12/23/2015   HDL 67.50 12/23/2015   LDLCALC 86 12/23/2015   LDLDIRECT 114.4 06/10/2013   TRIG 97.0 12/23/2015   CHOLHDL 3 12/23/2015  SE to statins.  Using medications without problems:  Muscle aches: None  Diet compliance: Good. Exercise:[employer], three times week. Other complaints: right carotid artery stenosis   Thyroid Nodules: Muliptle thyroid nodules, followed by ENDO. BX showed no malignancy. TSH stable recent check.  Prediabetes: new .Marland Kitchen Glucose 102  Continues to use valium regularly at bedtime. Sleeping better at night.  Using back brace as needed.  No falls, no sedation with this med.    Carotid  stenosis: Last 04/2015.Marland Kitchen Plan repeat in 1 year, 04/2016  Social History /Family History/Past Medical History reviewed and updated if needed.  Review of Systems  Constitutional: Negative for fever and fatigue.  HENT: Negative for ear pain.  Eyes: Negative for pain.  Respiratory: Negative for chest tightness and shortness of breath.  Cardiovascular: Negative for chest pain, palpitations and leg swelling.  Gastrointestinal: Negative for abdominal pain.  Genitourinary: Negative for dysuria.  Musculoskeletal: Negative for back pain.  Objective:   Physical Exam  Constitutional: Vital signs are normal. She appears well-developed and well-nourished. She is cooperative. Non-toxic appearance. She does not appear ill. No distress.  HENT:  Head: Normocephalic.  Right Ear: Hearing, tympanic membrane, external ear and ear canal normal.  Left Ear: Hearing, tympanic membrane, external ear and ear canal normal.  Nose: Nose normal.  Eyes: Conjunctivae, EOM and lids are normal. Pupils are equal, round, and reactive to light. No foreign bodies found.  Neck: Trachea normal and normal range of motion. Neck supple. Carotid bruit is not present. No mass and no thyromegaly present.  Cardiovascular: Normal rate, regular rhythm, S1 normal, S2 normal, normal heart sounds and intact distal pulses. Exam reveals no gallop.  No murmur heard.  Breast Exam: per GYN.  Pulmonary/Chest: Effort normal and breath sounds normal. No respiratory distress. She has no wheezes. She has no rhonchi. She has no rales.  Abdominal: Soft. Normal appearance and bowel sounds are normal. She exhibits no distension, no fluid wave, no abdominal bruit and no mass. There is no hepatosplenomegaly. There is no tenderness. There is no rebound, no guarding and no CVA tenderness. No hernia.  Lymphadenopathy:  She has no cervical adenopathy.  She has no axillary adenopathy.  Neurological: She is alert. She has normal strength.  No cranial nerve deficit or sensory deficit.  Skin: Skin is warm, dry and intact. No rash noted.  Psychiatric: Her speech is normal and behavior is normal. Judgment normal. Her mood appears not anxious. Cognition and memory are normal. She does not exhibit a depressed mood.  Assessment & Plan:   AMW: The patient's preventative maintenance and recommended screening tests for an annual wellness exam were reviewed in full today.  Brought up to date unless services declined.  Counselled on the importance of diet, exercise, and its role in overall health and mortality.  The patient's FH and SH was reviewed, including their home life, tobacco status, and drug and alcohol status.   Vaccines: Uptodate with shingles, Td and PNA, she will consider prevnar in future. DEXA: osteopenia 06/2009, due.. Not interested.  Mammogram: GYN, not indicated Colon: Dr. Carlean Purl.. nml 2012... Recommended in 10 years. Mother with colon cancer.  DVE/PAP: Sees GYN Dr. Kenton Kingfisher, no pap indicated, no risk factors for ovarian/uterine cancer.. No further DVE.  Nonsmoker

## 2015-12-28 NOTE — Patient Instructions (Addendum)
Decrease carbs in diet. Work on [employer] as well, decrease animal fats.  Try to exercise as much as possible, may be increase to 5 times.

## 2015-12-28 NOTE — Assessment & Plan Note (Signed)
HCPOA: husband, has living will, full code (reviewed 2017)

## 2015-12-28 NOTE — Addendum Note (Signed)
Addended byEliezer Lofts E on: 12/28/2015 12:14 PM   Modules accepted: SmartSet

## 2015-12-28 NOTE — Assessment & Plan Note (Signed)
LDL almost at goal on red yeast rice. Goal < 70 given carotid stenosis.

## 2015-12-28 NOTE — Assessment & Plan Note (Addendum)
Well controlled. Continue current medication.  

## 2015-12-28 NOTE — Assessment & Plan Note (Signed)
Repeat due in 04/2016.

## 2015-12-31 ENCOUNTER — Ambulatory Visit (INDEPENDENT_AMBULATORY_CARE_PROVIDER_SITE_OTHER): Payer: Medicare Other | Admitting: Internal Medicine

## 2015-12-31 ENCOUNTER — Encounter: Payer: Self-pay | Admitting: Internal Medicine

## 2015-12-31 VITALS — BP 120/68 | HR 79 | Ht 64.0 in | Wt 162.4 lb

## 2015-12-31 DIAGNOSIS — I6521 Occlusion and stenosis of right carotid artery: Secondary | ICD-10-CM

## 2015-12-31 DIAGNOSIS — J309 Allergic rhinitis, unspecified: Secondary | ICD-10-CM | POA: Diagnosis not present

## 2015-12-31 DIAGNOSIS — J3089 Other allergic rhinitis: Secondary | ICD-10-CM

## 2015-12-31 DIAGNOSIS — J302 Other seasonal allergic rhinitis: Secondary | ICD-10-CM

## 2015-12-31 DIAGNOSIS — G4733 Obstructive sleep apnea (adult) (pediatric): Secondary | ICD-10-CM

## 2015-12-31 NOTE — Progress Notes (Signed)
08/08/11-75 year old female never smoker followed for obstructive sleep apnea, allergic rhinitis LOV-08/09/10 She plans to get flu shot today at the drugstore. Continues daily use of CPAP 9/[facility] all night every night. Has lost 20 pounds with [employer] and would like to have her pressure reduced to 8. Being treated medically for her degenerative disc disease and back pain without surgery planned. Nasal congestion has not been a problem.  12/31/12- 75 year old female never smoker followed for obstructive sleep apnea, allergic rhinitis FOLLOWS FOR: now uses Elite [employer]med machine now; wears CPAP every night for about 6-8 hours and states since new machine she is doing great(sleeps deeper and feels more alert during the day. This machine adjusts to what pressure she needs. CPAP/AutoPap/[facility] well tolerated with good control. No longer snoring. Minimal rhinitis symptoms this pollen season. Has not needed medications. Concerned about bilateral ankle edema and we discussed her calcium channel blocker.  01/22/14- 75 year old female never smoker followed for obstructive sleep apnea, allergic rhinitis/conjunctivitis FOLLOWS FOR: Wears CPAP auto/[facility] every night for about 7 hours; pressure works well for patient.  Claritin has been sufficient for watery eyes  12/31/2015-75 year old female never smoker followed for OSA, allergic rhinitis/conjunctivitis CPAP auto 5-15/[facility] She falls asleep easily and sleeps soundly with her CPAP machine is new and pressure has been comfortable auto 5-15. Her husband has been expressing some anxiety about what he says is her more labored breathing while asleep with CPAP. She does not notice daytime dyspnea. Download shows excellent compliance and control.  ROS-see HPI Constitutional:   No-   weight loss, night sweats, fevers, chills, fatigue, lassitude. HEENT:   No-  headaches, difficulty swallowing, tooth/dental problems, sore throat,       No-  sneezing,  itching, ear ache, nasal congestion, post nasal drip,  CV:  No-   chest pain, orthopnea, PND, +swelling in lower extremities, anasarca,                                                   dizziness, palpitations Resp: No-   shortness of breath with exertion or at rest.              No-   productive cough,  No non-productive cough,  No- coughing up of blood.              No-   change in color of mucus.  No- wheezing.   Skin: No-   rash or lesions. GI:  No-   heartburn, indigestion, abdominal pain, nausea, vomiting, GU: . MS:  No-   joint pain or swelling.  No- decreased range of motion.  No- back pain. Neuro-     nothing unusual Psych:  No- change in mood or affect. No depression or anxiety.  No memory loss.   OBJ General- Alert, Oriented, Affect-appropriate, Distress- none acute; looks well Skin- rash-none, lesions- none, excoriation- none Lymphadenopathy- none Head- atraumatic            Eyes- Gross vision intact, PERRLA, conjunctivae +minor capillary injection            Ears- Hearing, canals-normal            Nose- Clear, no-Septal dev, mucus, polyps, erosion, perforation             Throat- Mallampati II-III , mucosa clear , drainage- none, tonsils- atrophic Neck- flexible ,  trachea midline, no stridor , thyroid nl, carotid no bruit Chest - symmetrical excursion , unlabored           Heart/CV- RRR , no murmur , no gallop  , no rub, nl s1 s2                           - JVD- none , edema-+1 bilateral ankle, stasis changes- none, varices- none           Lung- clear to P&A, wheeze- none, cough- none , dullness-none, rub- none           Chest wall-  Abd-  Br/ Gen/ Rectal- Not done, not indicated Extrem- cyanosis- none, clubbing, none, atrophy- none, strength- nl Neuro- grossly intact to observation

## 2015-12-31 NOTE — Assessment & Plan Note (Signed)
Adequate compliance and control. She sleeps well and is satisfied with current pressure and mask. She implies that her husband's anxiety may be the reason he comments about her "labored" breathing while asleep. She is not snoring

## 2015-12-31 NOTE — Patient Instructions (Signed)
We can continue CPAP auto 5-15/ [facility]     Please call if needed

## 2015-12-31 NOTE — Assessment & Plan Note (Signed)
She reports satisfactory controlled this spring with Flonase and Claritin. Plan-continue current management

## 2016-01-06 ENCOUNTER — Encounter: Payer: Self-pay | Admitting: Internal Medicine

## 2016-02-11 ENCOUNTER — Telehealth: Payer: Self-pay | Admitting: Family Medicine

## 2016-02-11 NOTE — Telephone Encounter (Signed)
Valium called into [facility] [address].

## 2016-02-11 NOTE — Telephone Encounter (Signed)
Last office visit 12/31/2015.  Last refilled 11/30/2015 for #60 with no refills.  Ok to refill?

## 2016-02-15 NOTE — Telephone Encounter (Addendum)
Pt left v/m; pt going out of town early on 02/18/16; pt said pharmacy advised pt not sure can get Valium 2 mg any longer and suggest new rx for Valium 5 mg taking one half tab.  [facility] [address] Pt request cb when done. Due to pt going out of town will need to get new rx to pharmacy 02/16/16 so can order med and get in before pt goes out of town.

## 2016-02-16 MED ORDER — DIAZEPAM 5 MG PO TABS: 2.5000 mg | ORAL_TABLET | Freq: Two times a day (BID) | ORAL | Status: DC | PRN

## 2016-02-16 MED ORDER — VALIUM 5 MG PO TABS: 2.5000 mg | ORAL_TABLET | Freq: Two times a day (BID) | ORAL | Status: DC | PRN

## 2016-02-16 NOTE — Telephone Encounter (Addendum)
Rx faxed to [facility] at [phone].  Rx changed to Valium 5 mg take 1/2 tablet q 12 hours prn (as previously prescribed) for #30 with no refills.  Ms. Omana notified.

## 2016-02-16 NOTE — Telephone Encounter (Signed)
Ok to ref.  Valium 5 mg, 1/2 tab po q 8 hours prn anxiety #30, 0 ref

## 2016-02-16 NOTE — Addendum Note (Signed)
Addended by: Carter Kitten on: 02/16/2016 04:31 PM   Modules accepted: Orders

## 2016-03-23 ENCOUNTER — Other Ambulatory Visit: Payer: Self-pay | Admitting: Family Medicine

## 2016-03-24 NOTE — Telephone Encounter (Signed)
Last office visit 12/28/15.  Last refilled 02/16/16 for #30 with no refills.  Ok to refill?

## 2016-03-27 NOTE — Telephone Encounter (Signed)
Valium called into [facility] [address].

## 2016-03-29 ENCOUNTER — Ambulatory Visit: Payer: Medicare Other | Admitting: Podiatry

## 2016-04-07 ENCOUNTER — Encounter: Payer: Self-pay | Admitting: Sports Medicine

## 2016-04-07 ENCOUNTER — Ambulatory Visit (INDEPENDENT_AMBULATORY_CARE_PROVIDER_SITE_OTHER): Payer: Medicare Other | Admitting: Sports Medicine

## 2016-04-07 DIAGNOSIS — Q828 Other specified congenital malformations of skin: Secondary | ICD-10-CM | POA: Diagnosis not present

## 2016-04-07 DIAGNOSIS — M79676 Pain in unspecified toe(s): Secondary | ICD-10-CM | POA: Diagnosis not present

## 2016-04-07 DIAGNOSIS — B351 Tinea unguium: Secondary | ICD-10-CM | POA: Diagnosis not present

## 2016-04-07 DIAGNOSIS — Z87828 Personal history of other (healed) physical injury and trauma: Secondary | ICD-10-CM
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
## 2016-04-19 ENCOUNTER — Encounter: Payer: Self-pay | Admitting: Podiatry

## 2016-04-19 ENCOUNTER — Ambulatory Visit (INDEPENDENT_AMBULATORY_CARE_PROVIDER_SITE_OTHER): Payer: Medicare Other | Admitting: Podiatry

## 2016-04-19 VITALS — BP 132/88 | HR 69 | Resp 12

## 2016-04-19 DIAGNOSIS — Q828 Other specified congenital malformations of skin: Secondary | ICD-10-CM

## 2016-04-19 DIAGNOSIS — M79676 Pain in unspecified toe(s): Secondary | ICD-10-CM

## 2016-04-19 DIAGNOSIS — B351 Tinea unguium: Secondary | ICD-10-CM

## 2016-04-19 DIAGNOSIS — I6521 Occlusion and stenosis of right carotid artery: Secondary | ICD-10-CM | POA: Diagnosis not present

## 2016-04-19 NOTE — Progress Notes (Signed)
Raeonna presents today with a chief complaint of a painful lesion subsecond metatarsophalangeal joint of the left foot. She states that she has done a lot of walking lately as her husband has recently had a seizure disorder diagnosed. She states that she also has ingrown nail to the hallux.  Objective: Vital signs are stable alert and oriented 3 evaluation plantar aspect of the left foot demonstrates a reactive hyperkeratosis was debrided there is no bleeding. I placed padding. Pulses remain palpable. Sharp and radial nail margin on the tibiofibular border of the hallux left.  Assessment: Peripheral vascular disease. Reactive hyperkeratosis porokeratosis. Ingrown nail.  Plan: Debridement of nail hallux left. Debridement of all reactive hyperkeratosis placed padding will follow up with her when necessary.

## 2016-04-28 ENCOUNTER — Ambulatory Visit: Payer: Medicare Other | Admitting: Sports Medicine

## 2016-05-02 ENCOUNTER — Other Ambulatory Visit: Payer: Self-pay | Admitting: Family Medicine

## 2016-05-02 DIAGNOSIS — I6523 Occlusion and stenosis of bilateral carotid arteries: Secondary | ICD-10-CM

## 2016-05-03 ENCOUNTER — Telehealth: Payer: Self-pay

## 2016-05-03 NOTE — Telephone Encounter (Signed)
Pt left v/m; pt has been seeing Dr Milinda Pointer about foot problem and Dr Milinda Pointer recommended pt get handicap placard but Dr Milinda Pointer can only get temporary placard and PCP can get handicap placard for longer period of time. Pt request cb. Handicap placard form in Dr Arley Phenix in box. Last annual exam on 12/28/15.

## 2016-05-04 ENCOUNTER — Ambulatory Visit: Payer: Medicare Other

## 2016-05-04 DIAGNOSIS — I6523 Occlusion and stenosis of bilateral carotid arteries: Secondary | ICD-10-CM

## 2016-05-12 DIAGNOSIS — M79673 Pain in unspecified foot: Secondary | ICD-10-CM

## 2016-05-22 ENCOUNTER — Ambulatory Visit: Payer: Medicare Other | Admitting: Podiatry

## 2016-05-30 ENCOUNTER — Other Ambulatory Visit: Payer: Self-pay | Admitting: Family Medicine

## 2016-05-30 DIAGNOSIS — M79673 Pain in unspecified foot: Secondary | ICD-10-CM

## 2016-05-30 NOTE — Telephone Encounter (Signed)
Last office visit 12/28/2015.  Last refilled 03/27/2016 for #30 with no refills.  Ok to refill?

## 2016-05-30 NOTE — Telephone Encounter (Signed)
Valium called into [facility] [address].

## 2016-06-05 ENCOUNTER — Other Ambulatory Visit (INDEPENDENT_AMBULATORY_CARE_PROVIDER_SITE_OTHER): Payer: Medicare Other

## 2016-06-05 ENCOUNTER — Telehealth: Payer: Self-pay | Admitting: Family Medicine

## 2016-06-05 DIAGNOSIS — E7800 Pure hypercholesterolemia, unspecified: Secondary | ICD-10-CM

## 2016-06-05 LAB — COMPREHENSIVE METABOLIC PANEL
ALBUMIN: 4.6 g/dL (ref 3.5–5.2)
ALT: 20 U/L (ref 0–35)
AST: 19 U/L (ref 0–37)
Alkaline Phosphatase: 88 U/L (ref 39–117)
BUN: 22 mg/dL (ref 6–23)
CALCIUM: 9.6 mg/dL (ref 8.4–10.5)
CHLORIDE: 99 meq/L (ref 96–112)
CO2: 28 meq/L (ref 19–32)
Creatinine, Ser: 0.73 mg/dL (ref 0.40–1.20)
GFR: 82.21 mL/min (ref >60.00)
Glucose, Bld: 88 mg/dL (ref 70–99)
POTASSIUM: 4.1 meq/L (ref 3.5–5.1)
Sodium: 134 mEq/L — ABNORMAL LOW (ref 135–145)
Total Bilirubin: 0.6 mg/dL (ref 0.2–1.2)
Total Protein: 7.7 g/dL (ref 6.0–8.3)

## 2016-06-05 LAB — LIPID PANEL
CHOL/HDL RATIO: 3
CHOLESTEROL: 172 mg/dL (ref 0–200)
HDL: 67.7 mg/dL (ref >39.00)
LDL CALC: 79 mg/dL (ref 0–99)
NonHDL: 104.48
TRIGLYCERIDES: 129 mg/dL (ref 0.0–149.0)
VLDL: 25.8 mg/dL (ref 0.0–40.0)

## 2016-06-05 NOTE — Telephone Encounter (Signed)
-----   Message from Ellamae Sia sent at 05/30/2016  4:01 PM EDT ----- Regarding: Lab orders for Monday, 10.2.17 Lab orders for a 6 month follow up appt

## 2016-06-08 ENCOUNTER — Ambulatory Visit (INDEPENDENT_AMBULATORY_CARE_PROVIDER_SITE_OTHER): Payer: Medicare Other | Admitting: Family Medicine

## 2016-06-08 ENCOUNTER — Encounter: Payer: Self-pay | Admitting: Family Medicine

## 2016-06-08 VITALS — BP 146/74 | HR 74 | Temp 97.6°F | Ht 64.0 in | Wt 156.5 lb

## 2016-06-08 DIAGNOSIS — I10 Essential (primary) hypertension: Secondary | ICD-10-CM

## 2016-06-08 DIAGNOSIS — Z23 Encounter for immunization: Secondary | ICD-10-CM

## 2016-06-08 DIAGNOSIS — E7800 Pure hypercholesterolemia, unspecified: Secondary | ICD-10-CM | POA: Diagnosis not present

## 2016-06-08 DIAGNOSIS — I6523 Occlusion and stenosis of bilateral carotid arteries: Secondary | ICD-10-CM | POA: Diagnosis not present

## 2016-06-08 NOTE — Progress Notes (Signed)
Pre visit review using our clinic review tool, if applicable. No additional management support is needed unless otherwise documented below in the visit note. 

## 2016-06-08 NOTE — Assessment & Plan Note (Signed)
Almost at goal with red yeast rice.  Working on weight loss and healthy eating.

## 2016-06-08 NOTE — Progress Notes (Signed)
75 year old female presents for 6 month follow up.  Hypertension: Well controlled on current regimen, slightly above goal today. BP Readings from Last 3 Encounters:  06/08/16 (!) 146/74  04/19/16 132/88  12/31/15 120/68   Using medication without problems or lightheadedness: None  Chest pain with exertion:None  Edema: intermittent , but better than previously. Short of breath:None  Average home BPs: not checking lately.  Other issues:   Elevated Cholesterol: 4 tabs daily of red yeast rice.Marland Kitchen LDL NOT at goal <70 with carotid stenosis but almost! Lab Results  Component Value Date   CHOL 172 06/05/2016   HDL 67.70 06/05/2016   LDLCALC 79 06/05/2016   LDLDIRECT 114.4 06/10/2013   TRIG 129.0 06/05/2016   CHOLHDL 3 06/05/2016   SE to statins.  Using medications without problems:  Muscle aches: None  Diet compliance: Good on weight watchers. Exercise:[employer], three times week until recent foot issues. Wearing orthotic. Other complaints: right carotid artery stenosis   Thyroid Nodules: Muliptle thyroid nodules, followed by ENDO. BX showed no malignancy. TSH stable recent check.  Prediabetes: new .Marland Kitchen Glucose 88, improved.  Continues to use valium regularly at bedtime. Sleeping better at night.  Using back brace as needed.  No falls, no sedation with this med.    Carotid stenosis: Last 04/2016.Marland Kitchen Plan repeat in 1 year, 04/2017  Wt Readings from Last 3 Encounters:  06/08/16 156 lb 8 oz (71 kg)  12/31/15 162 lb 6.4 oz (73.7 kg)  12/28/15 161 lb 8 oz (73.3 kg)   She has a total of 20 lb weight loss in last year.  Body mass index is 26.86 kg/m.    Social History /Family History/Past Medical History reviewed and updated if needed.  Review of Systems  Constitutional: Negative for fever and fatigue.  HENT: Negative for ear pain.  Eyes: Negative for pain.  Respiratory: Negative for chest tightness and shortness of breath.  Cardiovascular: Negative for  chest pain, palpitations and leg swelling.  Gastrointestinal: Negative for abdominal pain.  Genitourinary: Negative for dysuria.  Musculoskeletal: Negative for back pain.  Objective:   Physical Exam  Constitutional: Vital signs are normal. She appears well-developed and well-nourished. She is cooperative. Non-toxic appearance. She does not appear ill. No distress.  HENT:  Head: Normocephalic.  Right Ear: Hearing, tympanic membrane, external ear and ear canal normal.  Left Ear: Hearing, tympanic membrane, external ear and ear canal normal.  Nose: Nose normal.  Eyes: Conjunctivae, EOM and lids are normal. Pupils are equal, round, and reactive to light. No foreign bodies found.  Neck: Trachea normal and normal range of motion. Neck supple. Carotid bruit is not present. No mass and no thyromegaly present.  Cardiovascular: Normal rate, regular rhythm, S1 normal, S2 normal, normal heart sounds and intact distal pulses. Exam reveals no gallop.  No murmur heard.  Breast Exam: per GYN.  Pulmonary/Chest: Effort normal and breath sounds normal. No respiratory distress. She has no wheezes. She has no rhonchi. She has no rales.  Abdominal: Soft. Normal appearance and bowel sounds are normal. She exhibits no distension, no fluid wave, no abdominal bruit and no mass. There is no hepatosplenomegaly. There is no tenderness. There is no rebound, no guarding and no CVA tenderness. No hernia.  Lymphadenopathy:  She has no cervical adenopathy.  She has no axillary adenopathy.  Neurological: She is alert. She has normal strength. No cranial nerve deficit or sensory deficit.  Skin: Skin is warm, dry and intact. No rash noted.  Psychiatric:  Her speech is normal and behavior is normal. Judgment normal. Her mood appears not anxious. Cognition and memory are normal. She does not exhibit a depressed mood.

## 2016-06-08 NOTE — Assessment & Plan Note (Signed)
Well controlled. Continue current medication.  

## 2016-06-08 NOTE — Patient Instructions (Signed)
Keep working on healthy lifestyle! Get back to exercise as able.

## 2016-06-20 ENCOUNTER — Other Ambulatory Visit: Payer: Medicare Other

## 2016-06-30 ENCOUNTER — Ambulatory Visit: Payer: Medicare Other | Admitting: Family Medicine

## 2016-07-11 ENCOUNTER — Ambulatory Visit: Payer: Medicare Other | Admitting: Podiatry

## 2016-07-12 ENCOUNTER — Encounter: Payer: Self-pay | Admitting: Podiatry

## 2016-07-12 ENCOUNTER — Ambulatory Visit (INDEPENDENT_AMBULATORY_CARE_PROVIDER_SITE_OTHER): Payer: Medicare Other | Admitting: Podiatry

## 2016-07-12 DIAGNOSIS — Q828 Other specified congenital malformations of skin: Secondary | ICD-10-CM

## 2016-07-12 DIAGNOSIS — M79676 Pain in unspecified toe(s): Secondary | ICD-10-CM

## 2016-07-12 DIAGNOSIS — B351 Tinea unguium: Secondary | ICD-10-CM | POA: Diagnosis not present

## 2016-07-12 NOTE — Progress Notes (Signed)
She presents today with chief complaint of a painful lesion sub-third metatarsophalangeal joint of the left foot. She is also complaining of painful elongated toenails.  Objective: Toenails are thick yellow dystrophic with mycotic reactive hyperkeratosis of third metatarsal head of the left foot associated with hammertoe deformity. No open lesions or wounds are noted. No signs of infection.  Assessment: Onychomycosis bilateral foot with pain in limb associated with that as well as the poor keratoma. Debridement of porokeratotic for as well.  Plan: Debrided nails 1 through 5 and debrided porokeratotic lesion place padding. Follow up with her in 2 months

## 2016-07-14 ENCOUNTER — Other Ambulatory Visit: Payer: Self-pay | Admitting: Family Medicine

## 2016-07-24 ENCOUNTER — Other Ambulatory Visit: Payer: Self-pay | Admitting: Family Medicine

## 2016-08-23 ENCOUNTER — Other Ambulatory Visit: Payer: Self-pay | Admitting: Family Medicine

## 2016-08-23 NOTE — Telephone Encounter (Signed)
Last office visit 06/08/2016.  Last refilled 05/30/2016 for #30 with no refills.  Ok to refill?

## 2016-08-24 NOTE — Telephone Encounter (Signed)
Called in to [facility] - [place], [place] - [address] [phone]  

## 2016-08-29 ENCOUNTER — Encounter: Payer: Self-pay | Admitting: Family Medicine

## 2016-08-29 ENCOUNTER — Ambulatory Visit (INDEPENDENT_AMBULATORY_CARE_PROVIDER_SITE_OTHER): Payer: Medicare Other | Admitting: Family Medicine

## 2016-08-29 VITALS — BP 140/80 | HR 77 | Temp 98.2°F | Ht 64.0 in | Wt 159.5 lb

## 2016-08-29 DIAGNOSIS — J209 Acute bronchitis, unspecified: Secondary | ICD-10-CM | POA: Diagnosis not present

## 2016-08-29 DIAGNOSIS — I6523 Occlusion and stenosis of bilateral carotid arteries: Secondary | ICD-10-CM | POA: Diagnosis not present

## 2016-08-29 MED ORDER — AZITHROMYCIN 250 MG PO TABS: ORAL_TABLET | ORAL | 0 refills | Status: DC

## 2016-08-29 NOTE — Progress Notes (Signed)
Subjective:    Patient ID: [name] [name], female    DOB: Jun 24, 1941, 75 y.o.   MRN: [id number]  HPI Here for uri symptoms   Started with a dry hacking cough 12/20  It subsided a bit on 12/25 Now loosing her voice [address] now -raw and glands feel swollen  Feels like low grade temp at night - took acetaminophen  Some body aches   Now cough is productive -yellow mucous Some facial pain - clear runny nose    Has allergies -takes claritin every day  Used some left over robitussin ac - helpful (from last year)    Has had a flu shot   Patient Active Problem List   Diagnosis Date Noted  . Counseling regarding end of life decision making 12/18/2014  . Raynaud phenomenon 08/31/2014  . Chronic allergic conjunctivitis 03/15/2014  . 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 Chronic back pain greater than 3 months duration 08/13/2012  . S/P ankle fusion 02/06/2011  . TEMPOROMANDIBULAR JOINT PAIN 05/20/2010  . Carotid stenosis 06/30/2009  . Multiple thyroid nodules 03/15/2009  . Obstructive sleep apnea 03/05/2009  . HEMORRHOIDS, INTERNAL, WITH BLEEDING 05/09/2007  . VERTIGO 12/18/2006  . COLONIC POLYPS 12/17/2006  . HYPERCHOLESTEROLEMIA 12/17/2006  . Essential hypertension, benign 12/17/2006  . DIVERTICULAR DISEASE 12/17/2006  . VAGINITIS, ATROPHIC 12/17/2006  . Osteoarthritis, generalized 12/17/2006  . SCOLIOSIS 12/17/2006  . Seasonal and perennial allergic rhinitis 12/17/2006   Past Medical History:  Diagnosis Date  . Arthritis   . Chronic lower back pain   . Diverticulitis   . GERD (gastroesophageal reflux disease)   . Hemorrhoids, internal, with bleeding   . Hiatal hernia   . History of colon polyps   . HLD (hyperlipidemia)   . Hypertension   . OSA (obstructive sleep apnea)   . PUD (peptic ulcer disease)   . Scoliosis   . Thyroid disease    hypo  . Thyroid nodule   . Vaginitis, atrophic   . Vertigo    Past Surgical History:  Procedure  Laterality Date  . ANKLE FUSION  1967   LEFT  . CHOLECYSTECTOMY     2004  . COLONOSCOPY  06/22/2011   diverticulosis (no adenomas since 2001)  . FRACTURE SURGERY Left 1967   foot and ankle surgery   Social History  Substance Use Topics  . Smoking status: Never Smoker  . Smokeless tobacco: Never Used  . Alcohol use Yes     Comment: wine occassionally   Family History  Problem Relation Age of Onset  . Cancer Mother     colon   . Colon cancer Mother 65  . Heart disease Mother   . Hypertension Mother   . Heart disease Father     cad  . Deep vein thrombosis Father   . Heart attack Father   . Stomach cancer Sister   . Cancer Sister   . Diabetes Sister   . Heart disease Sister   . Hyperlipidemia Sister   . Hypertension Sister   . Diabetes Brother   . Heart disease Brother     Heart Disease before age 2  . Hyperlipidemia Brother   . Hypertension Brother   . Cancer Sister   . Heart disease Brother   . Heart attack Brother   . Heart disease Brother   . Heart disease Brother    Allergies  Allergen Reactions  . Ace Inhibitors     REACTION: swelling  . Beta Adrenergic Blockers  REACTION: swelling  . Neosporin [Neomycin-Bacitracin Zn-Polymyx]   . Statins     REACTION: myalgia  . Tramadol     Does not want to take again.   Constipation, decreased appetite, hot/chills, did not do well with it   Current Outpatient Prescriptions on File Prior to Visit  Medication Sig Dispense Refill  . aspirin 81 MG tablet Take 81 mg by mouth daily.      Marland Kitchen azelastine (ASTELIN) 0.1 % nasal spray U 2 SPRAYS IEN Q 12 H.  5  . celecoxib (CELEBREX) 200 MG capsule TAKE 1 CAPSULE(200 MG) BY MOUTH TWICE DAILY 60 capsule 5  . DIOVAN 80 MG tablet TAKE 1 TABLET BY MOUTH EVERY DAY 90 tablet 2  . guaiFENesin-codeine (ROBITUSSIN AC) 100-10 MG/5ML syrup Take 5-10 mLs by mouth at bedtime as needed for cough. 180 mL 0  . loratadine (CLARITIN) 10 MG tablet Take 10 mg by mouth daily.    . NORVASC 10  MG tablet TAKE 1 TABLET(10 MG) BY MOUTH DAILY 30 tablet 5  . Olopatadine HCl (PATADAY) 0.2 % SOLN Place 1 drop into both eyes daily. 2.5 mL 5  . pantoprazole (PROTONIX) 40 MG tablet TAKE 1 TABLET BY MOUTH TWICE DAILY 180 tablet 3  . Red Yeast Rice 600 MG CAPS Take 4 capsules by mouth daily.     Marland Kitchen VALIUM 5 MG tablet TAKE 1/2 TABLET BY MOUTH EVERY 12 HOURS AS NEEDED 30 tablet 0   No current facility-administered medications on file prior to visit.     Review of Systems  Constitutional: Positive for appetite change and fatigue. Negative for fever.  HENT: Positive for congestion, postnasal drip, rhinorrhea, sinus pressure, sneezing and sore throat. Negative for ear pain.   Eyes: Negative for pain and discharge.  Respiratory: Positive for cough. Negative for shortness of breath, wheezing and stridor.   Cardiovascular: Negative for chest pain.  Gastrointestinal: Negative for diarrhea, nausea and vomiting.  Genitourinary: Negative for frequency, hematuria and urgency.  Musculoskeletal: Negative for arthralgias and myalgias.  Skin: Negative for rash.  Neurological: Positive for headaches. Negative for dizziness, weakness and light-headedness.  Psychiatric/Behavioral: Negative for confusion and dysphoric mood.       Objective:   Physical Exam  Constitutional: She appears well-developed and well-nourished. No distress.  Well appearing   HENT:  Head: Normocephalic and atraumatic.  Right Ear: External ear normal.  Left Ear: External ear normal.  Mouth/Throat: Oropharynx is clear and moist.  Nares are injected and congested  No sinus tenderness Clear rhinorrhea and post nasal drip Hoarse voice  Eyes: Conjunctivae and EOM are normal. Pupils are equal, round, and reactive to light. Right eye exhibits no discharge. Left eye exhibits no discharge.  Neck: Normal range of motion. Neck supple.  Cardiovascular: Normal rate and normal heart sounds.   Pulmonary/Chest: Effort normal and breath sounds  normal. No respiratory distress. She has no wheezes. She has no rales. She exhibits no tenderness.  Harsh bs Good air exch Some upper airway sounds noted   Lymphadenopathy:    She has no cervical adenopathy.  Neurological: She is alert.  Skin: Skin is warm and dry. No rash noted.  Psychiatric: She has a normal mood and affect.          Assessment & Plan:   Problem List Items Addressed This Visit      Respiratory   BRONCHITIS, ACUTE - Primary    With reassuring exam and no reactive airways or rales  Cover with zpak in  light of length of illness Disc symptomatic care - see instructions on AVS  Will use her robitussin ac prn  Acetaminophen prn Update if not starting to improve in a week or if worsening  =esp if wheeze or sob or fever

## 2016-08-29 NOTE — Patient Instructions (Signed)
I think you have laryngitis and bronchitis  Drink lots of fluids Rest Rest your voice Take the zpack as directed  Use the robitussin AC for cough as needed - caution of sedation   Update if not starting to improve in a week or if worsening

## 2016-08-29 NOTE — Assessment & Plan Note (Signed)
With reassuring exam and no reactive airways or rales  Cover with zpak in light of length of illness Disc symptomatic care - see instructions on AVS  Will use her robitussin ac prn  Acetaminophen prn Update if not starting to improve in a week or if worsening  =esp if wheeze or sob or fever

## 2016-08-29 NOTE — Progress Notes (Signed)
Pre visit review using our clinic review tool, if applicable. No additional management support is needed unless otherwise documented below in the visit note. 

## 2016-10-16 ENCOUNTER — Ambulatory Visit: Payer: Medicare Other | Admitting: Podiatry

## 2016-11-08 ENCOUNTER — Encounter: Payer: Self-pay | Admitting: Podiatry

## 2016-11-08 ENCOUNTER — Ambulatory Visit (INDEPENDENT_AMBULATORY_CARE_PROVIDER_SITE_OTHER): Payer: Medicare Other | Admitting: Podiatry

## 2016-11-08 DIAGNOSIS — B351 Tinea unguium: Secondary | ICD-10-CM | POA: Diagnosis not present

## 2016-11-08 DIAGNOSIS — M79676 Pain in unspecified toe(s): Secondary | ICD-10-CM | POA: Diagnosis not present

## 2016-11-08 DIAGNOSIS — Q828 Other specified congenital malformations of skin: Secondary | ICD-10-CM | POA: Diagnosis not present

## 2016-11-08 NOTE — Progress Notes (Signed)
She presents today for chief complaint of painful elongated toenails and calluses plantar aspect of the bilateral foot.  Objective: Vital signs are stable alert and oriented 3. Pulses are palpable. Neurologic sternum is intact. Hammertoe deformities resulting in plantar flexed metatarsals and reactive hyperkeratosis and porokeratotic lesions to the plantar aspect of the bilateral foot is observed. She has long thick yellow dystrophic sharply incurvated nail margins which may be mycotic.  Assessment: Pain in limb secondary to onychomycosis and porokeratosis.  Plan: Debridement of porokeratosis and toenails.

## 2016-11-23 ENCOUNTER — Telehealth: Payer: Self-pay

## 2016-11-23 NOTE — Telephone Encounter (Signed)
Pt left v/m; handicap placard has expired and pt request cb when new handicap placard form is completed. Form in Dr Arley Phenix in box.

## 2016-11-23 NOTE — Telephone Encounter (Signed)
Spoke with [name].  She was seen by Dr. Milinda Pointer who cut down her walking due to she has bone on bone in her left foot.  She is also requesting a permanent placard due to this will be a lifetime problem.

## 2016-11-23 NOTE — Telephone Encounter (Signed)
Ms. Mowbray notified Handicap Placard application is ready to be picked up at the front desk.

## 2016-11-23 NOTE — Telephone Encounter (Signed)
As far as I know her mobility is good.[name] [name] fine when comes into office. Call pt to find out what disability she has and whether placard is still needed.

## 2016-12-11 ENCOUNTER — Other Ambulatory Visit: Payer: Self-pay | Admitting: Family Medicine

## 2016-12-11 NOTE — Telephone Encounter (Signed)
Last office visit 08/29/2016 with Dr. Glori Bickers for bronchitis.  Last refilled 08/24/2016 for #30 with no refills.  Ok to refill?

## 2016-12-12 NOTE — Telephone Encounter (Signed)
Valium called into [facility] - [place],  - [address] Phone: [phone] 

## 2016-12-20 ENCOUNTER — Telehealth: Payer: Self-pay | Admitting: Internal Medicine

## 2016-12-20 NOTE — Telephone Encounter (Signed)
Spoke with pt and informed her to call [facility] and speak with Jonni Sanger and he can help her get her phone setup with machine. Pt verbalized understanding and stated she would call. Nothing further needed.

## 2016-12-20 NOTE — Telephone Encounter (Signed)
Pt states she is having some issues with her sleep machine. States her hose had a hole in it and it affected her eyes and hearing. She states she needs to have someone help her set up her phone to her machine. Please call.

## 2016-12-22 ENCOUNTER — Encounter: Payer: Medicare Other | Admitting: Family Medicine

## 2016-12-26 ENCOUNTER — Other Ambulatory Visit (INDEPENDENT_AMBULATORY_CARE_PROVIDER_SITE_OTHER): Payer: Medicare Other

## 2016-12-26 ENCOUNTER — Telehealth: Payer: Self-pay | Admitting: Family Medicine

## 2016-12-26 DIAGNOSIS — E042 Nontoxic multinodular goiter: Secondary | ICD-10-CM | POA: Diagnosis not present

## 2016-12-26 DIAGNOSIS — E7800 Pure hypercholesterolemia, unspecified: Secondary | ICD-10-CM

## 2016-12-26 DIAGNOSIS — I10 Essential (primary) hypertension: Secondary | ICD-10-CM

## 2016-12-26 LAB — LIPID PANEL
CHOL/HDL RATIO: 3
CHOLESTEROL: 184 mg/dL (ref 0–200)
HDL: 69.2 mg/dL (ref >39.00)
LDL CALC: 97 mg/dL (ref 0–99)
NonHDL: 115
TRIGLYCERIDES: 92 mg/dL (ref 0.0–149.0)
VLDL: 18.4 mg/dL (ref 0.0–40.0)

## 2016-12-26 LAB — COMPREHENSIVE METABOLIC PANEL
ALBUMIN: 4.6 g/dL (ref 3.5–5.2)
ALT: 19 U/L (ref 0–35)
AST: 16 U/L (ref 0–37)
Alkaline Phosphatase: 97 U/L (ref 39–117)
BUN: 29 mg/dL — ABNORMAL HIGH (ref 6–23)
CALCIUM: 9.8 mg/dL (ref 8.4–10.5)
CHLORIDE: 105 meq/L (ref 96–112)
CO2: 29 mEq/L (ref 19–32)
Creatinine, Ser: 0.76 mg/dL (ref 0.40–1.20)
GFR: 78.36 mL/min (ref >60.00)
Glucose, Bld: 94 mg/dL (ref 70–99)
POTASSIUM: 4.3 meq/L (ref 3.5–5.1)
Sodium: 139 mEq/L (ref 135–145)
Total Bilirubin: 0.5 mg/dL (ref 0.2–1.2)
Total Protein: 7.4 g/dL (ref 6.0–8.3)

## 2016-12-26 LAB — TSH: TSH: 1.22 u[IU]/mL (ref 0.35–4.50)

## 2016-12-26 LAB — T3, FREE: T3 FREE: 3.2 pg/mL (ref 2.3–4.2)

## 2016-12-26 LAB — T4, FREE: FREE T4: 0.7 ng/dL (ref 0.60–1.60)

## 2016-12-26 NOTE — Telephone Encounter (Signed)
-----   Message from Ellamae Sia sent at 12/14/2016  4:28 PM EDT ----- Regarding: Lab orders for Tuesday, 4.24.18 Patient is scheduled for CPX labs, please order future labs, Thanks , Karna Christmas

## 2016-12-27 ENCOUNTER — Other Ambulatory Visit: Payer: Self-pay | Admitting: Family Medicine

## 2016-12-28 ENCOUNTER — Other Ambulatory Visit: Payer: Self-pay | Admitting: Family Medicine

## 2016-12-29 ENCOUNTER — Ambulatory Visit (INDEPENDENT_AMBULATORY_CARE_PROVIDER_SITE_OTHER): Payer: Medicare Other | Admitting: Family Medicine

## 2016-12-29 ENCOUNTER — Encounter: Payer: Self-pay | Admitting: Family Medicine

## 2016-12-29 VITALS — BP 150/77 | HR 74 | Temp 97.5°F | Ht 64.0 in | Wt 160.8 lb

## 2016-12-29 DIAGNOSIS — E042 Nontoxic multinodular goiter: Secondary | ICD-10-CM

## 2016-12-29 DIAGNOSIS — F5104 Psychophysiologic insomnia: Secondary | ICD-10-CM | POA: Diagnosis not present

## 2016-12-29 DIAGNOSIS — I6521 Occlusion and stenosis of right carotid artery: Secondary | ICD-10-CM | POA: Diagnosis not present

## 2016-12-29 DIAGNOSIS — E7800 Pure hypercholesterolemia, unspecified: Secondary | ICD-10-CM

## 2016-12-29 DIAGNOSIS — E663 Overweight: Secondary | ICD-10-CM

## 2016-12-29 DIAGNOSIS — Z0000 Encounter for general adult medical examination without abnormal findings: Secondary | ICD-10-CM | POA: Diagnosis not present

## 2016-12-29 DIAGNOSIS — I10 Essential (primary) hypertension: Secondary | ICD-10-CM | POA: Diagnosis not present

## 2016-12-29 MED ORDER — DICLOFENAC SODIUM 1 % TD GEL: 4.0000 g | Freq: Four times a day (QID) | TRANSDERMAL | 1 refills | Status: DC

## 2016-12-29 NOTE — Progress Notes (Signed)
Pre visit review using our clinic review tool, if applicable. No additional management support is needed unless otherwise documented below in the visit note. 

## 2016-12-29 NOTE — Patient Instructions (Addendum)
Please stop at the front desk to set up referral for nutritionist. Try to decrease valium as able.    Use voltaren gel as needed.

## 2016-12-29 NOTE — Progress Notes (Signed)
Subjective:    Patient ID: [name] [name], female    DOB: 02-13-1941, 76 y.o.   MRN: [id number]  HPI   The patient presents for annual medicare wellness, complete physical and review of chronic health problems. He/She also has the following acute concerns today: She is interested in nutrition referral for weight loss. Weight loss is not working for her.  I have personally reviewed the Medicare Annual Wellness questionnaire and have noted 1. The patient's medical and social history 2. Their use of alcohol, tobacco or illicit drugs 3. Their current medications and supplements 4. The patient's functional ability including ADL's, fall risks, home safety risks and hearing or visual             impairment. 5. Diet and physical activities 6. Evidence for depression or mood disorders 7.         Updated provider list The patients weight, height, BMI and visual acuity have been recorded in the chart I have made referrals, counseling and provided education to the patient based review of the above and I have provided the pt with a written personalized care plan for preventive services.   Documentation of this information was scanned into the electronic record under the media tab.  Wt Readings from Last 3 Encounters:  12/29/16 160 lb 12 oz (72.9 kg)  08/29/16 159 lb 8 oz (72.3 kg)  06/08/16 156 lb 8 oz (71 kg)   Body mass index is 27.59 kg/m.   Elevated Cholesterol:   moderate control on red yeast rice. Goal < 70 given carotid stenosis Lab Results  Component Value Date   CHOL 184 12/26/2016   HDL 69.20 12/26/2016   LDLCALC 97 12/26/2016   LDLDIRECT 114.4 06/10/2013   TRIG 92.0 12/26/2016   CHOLHDL 3 12/26/2016   Using medications without problems:none Muscle aches: none Diet compliance: healhty Exercise: walking Other complaints:  multiple thyroid nodules: Stable TSH, free t3 and free t4.  Prediabetes: resolved with low carb diet.   Insomnia: Using valium at night. No falls,  no sedation with this med.  She is tolerating lower dose of 2 mg daily instead of 5 mg daily.  Carotid stenosis: 04/2016 [place] stable.   Hypertension: Well controlled at home on diovan and norvasc.   Using medication without problems or lightheadedness: none Chest pain with exertion:none Edema:yes Short of breath:none Average home BPs: well controlled at home. Other issues:    Hearing Screening   Method: Audiometry   125Hz  250Hz  500Hz  1000Hz  2000Hz  3000Hz  4000Hz  6000Hz  8000Hz   Right ear:   20 20 20  20     Left ear:   0 20 20  20     Vision Screening Comments: Eye Exam 11/14/16 with Dr. Sherrye Payor at [facility] directives and end of life planning reviewed in detail with patient and documented in EMR. Patient given handout on advance care directives if needed. HCPOA and living will updated if needed.   Social History /Family History/Past Medical History reviewed in detail and updated in EMR if needed.  Fall Risk  12/29/2016 12/28/2015 12/18/2014 12/25/2013  Falls in the past year? No No No No    Vitals:   12/29/16 1553  BP: (!) 150/77  Pulse: 74  Temp: 97.5 F (36.4 C)     Review of Systems  Constitutional: Negative for fatigue and fever.  HENT: Negative for congestion.   Eyes: Negative for pain.  Respiratory: Negative for cough and shortness of breath.   Cardiovascular: Negative for chest  pain, palpitations and leg swelling.  Gastrointestinal: Negative for abdominal pain.  Genitourinary: Negative for dysuria and vaginal bleeding.  Musculoskeletal: Negative for back pain.  Neurological: Negative for syncope, light-headedness and headaches.  Psychiatric/Behavioral: Negative for dysphoric mood.       Objective:   Physical Exam  Constitutional: Vital signs are normal. She appears well-developed and well-nourished. She is cooperative.  Non-toxic appearance. She does not appear ill. No distress.  HENT:  Head: Normocephalic.  Right Ear: Hearing, tympanic membrane,  external ear and ear canal normal.  Left Ear: Hearing, tympanic membrane, external ear and ear canal normal.  Nose: Nose normal.  Eyes: Conjunctivae, EOM and lids are normal. Pupils are equal, round, and reactive to light. Lids are everted and swept, no foreign bodies found.  Neck: Trachea normal and normal range of motion. Neck supple. Carotid bruit is not present. No thyroid mass and no thyromegaly present.  Cardiovascular: Normal rate, regular rhythm, S1 normal, S2 normal, normal heart sounds and intact distal pulses.  Exam reveals no gallop.   No murmur heard. Bilateral 1 plus pitting edema  Pulmonary/Chest: Effort normal and breath sounds normal. No respiratory distress. She has no wheezes. She has no rhonchi. She has no rales.  Abdominal: Soft. Normal appearance and bowel sounds are normal. She exhibits no distension, no fluid wave, no abdominal bruit and no mass. There is no hepatosplenomegaly. There is no tenderness. There is no rebound, no guarding and no CVA tenderness. No hernia.  Lymphadenopathy:    She has no cervical adenopathy.    She has no axillary adenopathy.  Neurological: She is alert. She has normal strength. No cranial nerve deficit or sensory deficit.  Skin: Skin is warm, dry and intact. No rash noted.  Psychiatric: Her speech is normal and behavior is normal. Judgment normal. Her mood appears not anxious. Cognition and memory are normal. She does not exhibit a depressed mood.          Assessment & Plan:  The patient's preventative maintenance and recommended screening tests for an annual wellness exam were reviewed in full today. Brought up to date unless services declined.  Counselled on the importance of diet, exercise, and its role in overall health and mortality. The patient's FH and SH was reviewed, including their home life, tobacco status, and drug and alcohol status.   Vaccines: Uptodate with shingles, Td and PNA, she will consider prevnar in  future. DEXA: osteopenia 06/2009, due.. Not interested.  Mammogram: no family history, pt refuses Colon: Dr. Carlean Purl.. nml 2012... Recommended in 10 years. Mother with colon cancer.  DVE/PAP:no pap indicated, no risk factors for ovarian/uterine cancer.. No further DVE.  Nonsmoker

## 2016-12-31 ENCOUNTER — Encounter: Payer: Self-pay | Admitting: Internal Medicine

## 2017-01-01 NOTE — Progress Notes (Signed)
* [facility]     Assessment and Plan:  OSA.  --Doing well with CPAP, but notes that she has a mark from her mask under her right eye, will have her undergo a fitting with [facility].   Rhinitis.  --Continue astelin  Date: 01/01/2017  MRN# [id number] [name] [name] 1941/07/13   [name] [name] is a 76 y.o. old female seen in follow up for chief complaint of  Chief Complaint  Patient presents with  . Follow-up    Pt wear cpap 7-8 hrs nightly. She was having some trouble with tubing but it was replaced.  . Sleep Apnea     HPI:   The patient is a 76 yo female with who had been following with Dr. Annamaria Boots at our [facility] office for OSA and rhinitis.  She had a problem with her hose, as it had a hole in it, the machine was blowing high pressures, and she notes that she was having problems with her ears and eyes, which have since cleared up. She then got a new tube, and and everything is fine now.  She is taking astelin one spray in each nostril every night, she also takes claritin every day.   Review of download data for 30 days shows average usage of 8 hours 35 minutes, CPAP, his AutoSet 5-15. 95th percentile pressure was 13.6. Review graphic data shows a pressure range between 12 and 14. Residual AHI is 0.5  Medication:   Outpatient Encounter Prescriptions as of 01/02/2017  Medication Sig  . aspirin 81 MG tablet Take 81 mg by mouth daily.    Marland Kitchen azelastine (ASTELIN) 0.1 % nasal spray U 2 SPRAYS IEN Q 12 H.  . celecoxib (CELEBREX) 200 MG capsule TAKE 1 CAPSULE(200 MG) BY MOUTH TWICE DAILY  . diclofenac sodium (VOLTAREN) 1 % GEL Apply 4 g topically 4 (four) times daily.  Marland Kitchen DIOVAN 80 MG tablet TAKE 1 TABLET BY MOUTH EVERY DAY  . guaiFENesin-codeine (ROBITUSSIN AC) 100-10 MG/5ML syrup Take 5-10 mLs by mouth at bedtime as needed for cough.  . loratadine (CLARITIN) 10 MG tablet Take 10 mg by mouth daily.  . NORVASC 10 MG tablet TAKE 1 TABLET(10 MG) BY MOUTH DAILY  .  Olopatadine HCl (PATADAY) 0.2 % SOLN Place 1 drop into both eyes daily.  . pantoprazole (PROTONIX) 40 MG tablet TAKE 1 TABLET BY MOUTH TWICE DAILY  . Red Yeast Rice 600 MG CAPS Take 4 capsules by mouth daily.   Marland Kitchen VALIUM 2 MG tablet TAKE 1/2 TO 1 TABLET BY MOUTH EVERY 12 HOURS   No facility-administered encounter medications on file as of 01/02/2017.      Allergies:  Ace inhibitors; Beta adrenergic blockers; Neosporin [neomycin-bacitracin zn-polymyx]; Statins; and Tramadol  Review of Systems: Gen:  Denies  fever, sweats. HEENT: Denies blurred vision. Cvc:  No dizziness, chest pain or heaviness Resp:   Denies cough or sputum porduction. Gi: Denies swallowing difficulty, stomach pain. constipation, bowel incontinence Gu:  Denies bladder incontinence, burning urine Ext:   No Joint pain, stiffness. Skin: No skin rash, easy bruising. Endoc:  No polyuria, polydipsia. Psych: No depression, insomnia. Other:  All other systems were reviewed and found to be negative other than what is mentioned in the HPI.   Physical Examination:   VS: BP (!) 160/80 (BP Location: Left Arm, Patient Position: Sitting, Cuff Size: Normal)   Pulse 71   Resp 16   Ht 5\' 4"  (1.626 m)   Wt 163 lb (73.9 kg)  SpO2 100%   BMI 27.98 kg/m    General Appearance: No distress  Neuro:without focal findings,  speech normal,  HEENT: PERRLA, EOM intact. Pulmonary: normal breath sounds, No wheezing.   CardiovascularNormal S1,S2.  No m/r/g.   Abdomen: Benign, Soft, non-tender. Renal:  No costovertebral tenderness  GU:  Not performed at this time. Endoc: No evident thyromegaly, no signs of acromegaly. Skin:   warm, no rash. Extremities: normal, no cyanosis, clubbing.   LABORATORY PANEL:   CBC No results for input(s): WBC, HGB, HCT, PLT in the last 168 hours. ------------------------------------------------------------------------------------------------------------------  Chemistries   Recent Labs Lab  12/26/16 0859  NA 139  K 4.3  CL 105  CO2 29  GLUCOSE 94  BUN 29*  CREATININE 0.76  CALCIUM 9.8  AST 16  ALT 19  ALKPHOS 97  BILITOT 0.5   ------------------------------------------------------------------------------------------------------------------  Cardiac Enzymes No results for input(s): TROPONINI in the last 168 hours. ------------------------------------------------------------  RADIOLOGY:   No results found for this or any previous visit. No results found for this or any previous visit. ------------------------------------------------------------------------------------------------------------------  Thank  you for allowing [facility] to assist in the care of your patient. Our recommendations are noted above.  Please contact us if we can be of further service.   Marda Stalker, MD.  [facility] Office Number: [phone]  Patricia Pesa, M.D.  Merton Border, M.D  01/01/2017

## 2017-01-02 ENCOUNTER — Encounter: Payer: Self-pay | Admitting: Internal Medicine

## 2017-01-02 ENCOUNTER — Ambulatory Visit (INDEPENDENT_AMBULATORY_CARE_PROVIDER_SITE_OTHER): Payer: Medicare Other | Admitting: Internal Medicine

## 2017-01-02 VITALS — BP 160/80 | HR 71 | Resp 16 | Ht 64.0 in | Wt 163.0 lb

## 2017-01-02 DIAGNOSIS — J302 Other seasonal allergic rhinitis: Secondary | ICD-10-CM | POA: Diagnosis not present

## 2017-01-02 DIAGNOSIS — G4733 Obstructive sleep apnea (adult) (pediatric): Secondary | ICD-10-CM | POA: Diagnosis not present

## 2017-01-02 DIAGNOSIS — J3089 Other allergic rhinitis: Secondary | ICD-10-CM | POA: Diagnosis not present

## 2017-01-02 DIAGNOSIS — F5104 Psychophysiologic insomnia: Secondary | ICD-10-CM | POA: Insufficient documentation

## 2017-01-02 NOTE — Assessment & Plan Note (Signed)
Ideally LDL goal < 70 .Marland Kitchen Pt not interested in statin. Continue red yeast rice ... Make sure at max dose.

## 2017-01-02 NOTE — Assessment & Plan Note (Signed)
TSH stable.  

## 2017-01-02 NOTE — Assessment & Plan Note (Signed)
Stable at last eval. LDL not at goal < 70 on red yeast rice. Encouraged exercise, weight loss, healthy eating habits.

## 2017-01-02 NOTE — Assessment & Plan Note (Signed)
In process of weaning down off this med if able.. Given increase risk with age > 41.  Pt has been able to decrease to2 mg at bedtime and will try to use only prn.

## 2017-01-02 NOTE — Assessment & Plan Note (Signed)
At home controlled on regimen.. Continue to follow.

## 2017-01-02 NOTE — Patient Instructions (Signed)
--  Continue using cpap every night.   --Can follow up with [facility] for mask fitting. If not able will refer to [place] to mask fitting clinic.

## 2017-01-05 ENCOUNTER — Ambulatory Visit (INDEPENDENT_AMBULATORY_CARE_PROVIDER_SITE_OTHER): Payer: Medicare Other | Admitting: Family Medicine

## 2017-01-05 ENCOUNTER — Encounter: Payer: Self-pay | Admitting: Family Medicine

## 2017-01-05 ENCOUNTER — Telehealth: Payer: Self-pay | Admitting: Family Medicine

## 2017-01-05 VITALS — BP 140/70 | HR 82 | Temp 97.2°F | Ht 64.0 in | Wt 162.5 lb

## 2017-01-05 DIAGNOSIS — S51832A Puncture wound without foreign body of left forearm, initial encounter: Secondary | ICD-10-CM | POA: Diagnosis not present

## 2017-01-05 DIAGNOSIS — Z23 Encounter for immunization: Secondary | ICD-10-CM | POA: Diagnosis not present

## 2017-01-05 DIAGNOSIS — I6521 Occlusion and stenosis of right carotid artery: Secondary | ICD-10-CM

## 2017-01-05 DIAGNOSIS — T148XXA Other injury of unspecified body region, initial encounter: Secondary | ICD-10-CM

## 2017-01-05 NOTE — Progress Notes (Signed)
Pre visit review using our clinic review tool, if applicable. No additional management support is needed unless otherwise documented below in the visit note. 

## 2017-01-05 NOTE — Assessment & Plan Note (Signed)
Area cleaned, not able to irrigate. Neosporin applied. Pt to watch for sign of infection.  Td booster given.

## 2017-01-05 NOTE — Telephone Encounter (Signed)
Patient Name: [name] [name] DOB: 04-05-1941 Initial Comment [name] states she stuck herself with something sharp at the grocery store, and was bleeding. It was deep. Nurse Assessment Nurse: Cherie Dark RN, Jarrett Soho Date/Time (Eastern Time): 01/05/2017 11:38:01 AM Confirm and document reason for call. If symptomatic, describe symptoms. ---[name] states she got stuck with a display at the grocery store today and it drew blood. It was some sort of plastic. She cut her arm and the pharmacist at the store cleaned it and gave her a band aid. Does the patient have any new or worsening symptoms? ---Yes Will a triage be completed? ---Yes Related visit to physician within the last 2 weeks? ---Yes Does the PT have any chronic conditions? (i.e. diabetes, asthma, etc.) ---Yes List chronic conditions. ---HTN, Is this a behavioral health or substance abuse call? ---No Guidelines Guideline Title Affirmed Question Affirmed Notes Puncture Wound Puncture wound from a sharp object that was very dirty Final Disposition User See Physician within 4 Hours (or PCP triage) Cherie Dark, RN, Jarrett Soho Referrals REFERRED TO PCP OFFICE Disagree/Comply: Comply Appt scheduled for 4pm today with Dr. Diona Browner.

## 2017-01-05 NOTE — Progress Notes (Signed)
   Subjective:    Patient ID: [name] [name], female    DOB: August 15, 1941, 76 y.o.   MRN: [id number]  HPI  76 year old female presents for puncture wound on left arm. Occur earlier today when skin punctured deeply by steel/plastic  item at store.  Area cleaned at store with alcohol.   Pt reports minimal pain in wound. Bleeding stopped. Last Tdap > 5 years ago.. Needs td booster.   Review of Systems  Constitutional: Negative for fatigue and fever.  HENT: Negative for ear pain.   Eyes: Negative for pain.  Respiratory: Negative for chest tightness and shortness of breath.   Cardiovascular: Negative for chest pain, palpitations and leg swelling.  Gastrointestinal: Negative for abdominal pain.  Genitourinary: Negative for dysuria.       Objective:   Physical Exam  Constitutional: She is oriented to person, place, and time. She appears well-developed and well-nourished. No distress.  HENT:  Head: Normocephalic.  Eyes: Conjunctivae are normal.  Cardiovascular: Normal rate and regular rhythm.   Pulmonary/Chest: Effort normal and breath sounds normal.  Neurological: She is alert and oriented to person, place, and time.  Skin: She is not diaphoretic.  Left forearm.. Pinpoint puncture, no surrounding redness, discharge.  Psychiatric: Her behavior is normal.          Assessment & Plan:

## 2017-01-05 NOTE — Patient Instructions (Signed)
Apply topical antibiotic. Keep wound clean.  Call if redness around wound, discharge or fever.

## 2017-01-05 NOTE — Telephone Encounter (Signed)
Pt has appt 01/05/17 at 4pm with Dr Diona Browner.

## 2017-01-25 ENCOUNTER — Ambulatory Visit (INDEPENDENT_AMBULATORY_CARE_PROVIDER_SITE_OTHER): Payer: Medicare Other | Admitting: Obstetrics & Gynecology

## 2017-01-25 ENCOUNTER — Encounter: Payer: Self-pay | Admitting: Obstetrics & Gynecology

## 2017-01-25 VITALS — BP 140/80 | HR 90 | Ht 64.0 in | Wt 160.0 lb

## 2017-01-25 DIAGNOSIS — I6521 Occlusion and stenosis of right carotid artery: Secondary | ICD-10-CM

## 2017-01-25 DIAGNOSIS — N949 Unspecified condition associated with female genital organs and menstrual cycle: Secondary | ICD-10-CM | POA: Diagnosis not present

## 2017-01-25 NOTE — Progress Notes (Signed)
HPI:      [name] [name] is a 76 y.o. with No LMP recorded. Patient is postmenopausal., presents today for a problem visit.  She complains of:  Vulvar concern:   This is a 76 year old Caucasian/White female who presents for the evaluation of vulvar lesion(s). She describes the vulvar lesion(s) as firm and NT on Left Labia. She indicates that she has noticed 1 lesions that the average size is approximately 27mm to 45mm.  She indicates she first noticed the problem three weeks ago. She admits to symptoms of min pain or other sx's.  The following aggravating factors are identified: none. The following alleviating factors are identified: none.  Shehas had no previous colposcopy for this condition. The lesion has had no been biopsied. She has had no previous treatment for this condition.   PMHx: She  has a past medical history of Arthritis; Chronic lower back pain; Diverticulitis; GERD (gastroesophageal reflux disease); Hemorrhoids, internal, with bleeding; Hiatal hernia; History of colon polyps; HLD (hyperlipidemia); Hypertension; OSA (obstructive sleep apnea); PUD (peptic ulcer disease); Scoliosis; Thyroid disease; Thyroid nodule; Vaginitis, atrophic; and Vertigo. Also,  has a past surgical history that includes Cholecystectomy; Ankle Fusion (1967); Colonoscopy (06/22/2011); and Fracture surgery (Left, 1967)., family history includes Cancer in her mother, sister, and sister; Colon cancer (age of onset: 28) in her mother; Deep vein thrombosis in her father; Diabetes in her brother and sister; Heart attack in her brother and father; Heart disease in her brother, brother, brother, brother, father, mother, and sister; Hyperlipidemia in her brother and sister; Hypertension in her brother, mother, and sister; Stomach cancer in her sister.,  reports that she has never smoked. She has never used smokeless tobacco. She reports that she drinks alcohol. She reports that she does not use drugs.  She has a current  medication list which includes the following prescription(s): aspirin, azelastine, celecoxib, diclofenac sodium, diovan, guaifenesin-codeine, loratadine, norvasc, olopatadine hcl, pantoprazole, red yeast rice, and valium. Also, is allergic to ace inhibitors; neosporin [neomycin-bacitracin zn-polymyx]; statins; tramadol; and beta adrenergic blockers.  Review of Systems  All other systems reviewed and are negative.  Objective: BP 140/80   Pulse 90   Ht 5\' 4"  (1.626 m)   Wt 160 lb (72.6 kg)   BMI 27.46 kg/m  Physical Exam  Constitutional: She is oriented to person, place, and time. She appears well-developed and well-nourished. No distress.  Genitourinary: Vagina normal and uterus normal. Pelvic exam was performed with patient supine. There is no rash, tenderness or lesion on the right labia.  There is lesion on the left labia. There is no rash or tenderness on the left labia.    No erythema or bleeding in the vagina. Right adnexum does not display mass and does not display tenderness. Left adnexum does not display mass and does not display tenderness. Cervix does not exhibit motion tenderness, discharge, polyp or nabothian cyst.   Uterus is mobile and midaxial. Uterus is not enlarged or exhibiting a mass.  Genitourinary Comments: Lesion 1-2 mm NT firm mobile with white head and yet no drg expressed  Abdominal: Soft. She exhibits no distension. There is no tenderness.  Musculoskeletal: Normal range of motion.  Neurological: She is alert and oriented to person, place, and time. No cranial nerve deficit.  Skin: Skin is warm and dry.  Psychiatric: She has a normal mood and affect.   ASSESSMENT/PLAN:   Visit Diagnoses    Vaginal lump    -  Primary    Likely  sebaceous cyst or hair follicle cyst. Monitor, consider excision if worsens. Annual soon, last PAP 2016 and MMG years ago  Barnett Applebaum, MD, [facility], [facility] 01/25/2017  4:07 PM

## 2017-02-05 ENCOUNTER — Emergency Department: Payer: Medicare Other

## 2017-02-05 ENCOUNTER — Emergency Department
Admission: EM | Admit: 2017-02-05 | Discharge: 2017-02-05 | Disposition: A | Discharge status: Home / Self Care | Payer: Medicare Other | Attending: Emergency Medicine | Admitting: Emergency Medicine

## 2017-02-05 ENCOUNTER — Encounter: Payer: Self-pay | Admitting: Emergency Medicine

## 2017-02-05 DIAGNOSIS — Z79899 Other long term (current) drug therapy: Secondary | ICD-10-CM | POA: Insufficient documentation

## 2017-02-05 DIAGNOSIS — R42 Dizziness and giddiness: Secondary | ICD-10-CM | POA: Insufficient documentation

## 2017-02-05 DIAGNOSIS — Z7982 Long term (current) use of aspirin: Secondary | ICD-10-CM | POA: Diagnosis not present

## 2017-02-05 DIAGNOSIS — I10 Essential (primary) hypertension: Secondary | ICD-10-CM | POA: Insufficient documentation

## 2017-02-05 LAB — CBC WITH DIFFERENTIAL/PLATELET
BASOS PCT: 1 %
Basophils Absolute: 0.1 10*3/uL (ref 0–0.1)
EOS ABS: 0.1 10*3/uL (ref 0–0.7)
Eosinophils Relative: 2 %
HCT: 43.7 % (ref 35.0–47.0)
HEMOGLOBIN: 14.9 g/dL (ref 12.0–16.0)
Lymphocytes Relative: 41 %
Lymphs Abs: 3.1 10*3/uL (ref 1.0–3.6)
MCH: 30.6 pg (ref 26.0–34.0)
MCHC: 34.1 g/dL (ref 32.0–36.0)
MCV: 89.6 fL (ref 80.0–100.0)
Monocytes Absolute: 0.3 10*3/uL (ref 0.2–0.9)
Monocytes Relative: 5 %
NEUTROS PCT: 51 %
Neutro Abs: 3.8 10*3/uL (ref 1.4–6.5)
Platelets: 280 10*3/uL (ref 150–440)
RBC: 4.87 MIL/uL (ref 3.80–5.20)
RDW: 13.1 % (ref 11.5–14.5)
WBC: 7.4 10*3/uL (ref 3.6–11.0)

## 2017-02-05 LAB — TROPONIN I

## 2017-02-05 LAB — BASIC METABOLIC PANEL
Anion gap: 10 (ref 5–15)
BUN: 21 mg/dL — ABNORMAL HIGH (ref 6–20)
CHLORIDE: 101 mmol/L (ref 101–111)
CO2: 24 mmol/L (ref 22–32)
CREATININE: 0.69 mg/dL (ref 0.44–1.00)
Calcium: 9.6 mg/dL (ref 8.9–10.3)
GFR calc Af Amer: >60 mL/min (ref >60)
GFR calc non Af Amer: >60 mL/min (ref >60)
Glucose, Bld: 97 mg/dL (ref 65–99)
Potassium: 4.2 mmol/L (ref 3.5–5.1)
SODIUM: 135 mmol/L (ref 135–145)

## 2017-02-05 MED ORDER — LORAZEPAM 2 MG/ML IJ SOLN
0.5000 mg | Freq: Once | INTRAMUSCULAR | Status: AC
  Administered 2017-02-05: 0.5 mg via INTRAVENOUS
  Filled 2017-02-05: qty 1

## 2017-02-05 MED ORDER — CLONIDINE HCL 0.1 MG PO TABS
0.1000 mg | ORAL_TABLET | Freq: Once | ORAL | Status: AC
  Administered 2017-02-05: 0.1 mg via ORAL
  Filled 2017-02-05: qty 1

## 2017-02-05 MED ORDER — CLONIDINE HCL 0.1 MG PO TABS: 0.2000 mg | ORAL_TABLET | Freq: Once | ORAL | Status: DC

## 2017-02-05 NOTE — ED Triage Notes (Signed)
Pt c/o feeling dizzy and mild headache today. Could feel her BP going up. BP was 200s/100s today. No chest pain. Did have some mild SHOB at times. Skin warm and dry

## 2017-02-05 NOTE — ED Notes (Signed)
To CT Scan via stretcher. AAOx3/  Skin warm and dry.  NAD 

## 2017-02-05 NOTE — Discharge Instructions (Addendum)
Please seek medical attention for any high fevers, chest pain, shortness of breath, change in behavior, persistent vomiting, bloody stool or any other new or concerning symptoms.  

## 2017-02-05 NOTE — ED Notes (Signed)

## 2017-02-05 NOTE — ED Provider Notes (Signed)
[facility] Emergency Department Provider Note  ____________________________________________   First MD Initiated Contact with Patient 02/05/17 1327     (approximate)  I have reviewed the triage vital signs and the nursing notes.   HISTORY  Chief Complaint Hypertension   HPI [name] [name] is a 75 y.o. female with a history of carotid stenosis who is presenting to the emergency department today with lightheadedness as well as unsteadiness with her gait. She says that she also has a pressure to the posterior of her head which is a 2 out of 10. She also says that she is under significant stress lately because of family issues. She says that she has been compliant with her blood pressure medications. Denying any chest pain. Denies any shortness of breath although medics and they were initially called for shortness of breath.   Past Medical History:  Diagnosis Date  . Arthritis   . Chronic lower back pain   . Diverticulitis   . GERD (gastroesophageal reflux disease)   . Hemorrhoids, internal, with bleeding   . Hiatal hernia   . History of colon polyps   . HLD (hyperlipidemia)   . Hypertension   . OSA (obstructive sleep apnea)   . PUD (peptic ulcer disease)   . Scoliosis   . Thyroid disease    hypo  . Thyroid nodule   . Vaginitis, atrophic   . Vertigo     Patient Active Problem List   Diagnosis Date Noted  . Puncture wound 01/05/2017  . Chronic insomnia 01/02/2017  . Counseling regarding end of life decision making 12/18/2014  . Raynaud phenomenon 08/31/2014  . Chronic allergic conjunctivitis 03/15/2014  . 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 Chronic back pain greater than 3 months duration 08/13/2012  . S/P ankle fusion 02/06/2011  . TEMPOROMANDIBULAR JOINT PAIN 05/20/2010  . Carotid stenosis 06/30/2009  . Multiple thyroid nodules 03/15/2009  . Obstructive sleep apnea 03/05/2009  . HEMORRHOIDS, INTERNAL, WITH BLEEDING  05/09/2007  . VERTIGO 12/18/2006  . COLONIC POLYPS 12/17/2006  . HYPERCHOLESTEROLEMIA 12/17/2006  . Essential hypertension, benign 12/17/2006  . DIVERTICULAR DISEASE 12/17/2006  . VAGINITIS, ATROPHIC 12/17/2006  . Osteoarthritis, generalized 12/17/2006  . SCOLIOSIS 12/17/2006  . Seasonal and perennial allergic rhinitis 12/17/2006    Past Surgical History:  Procedure Laterality Date  . ANKLE FUSION  1967   LEFT  . CHOLECYSTECTOMY     2004  . COLONOSCOPY  06/22/2011   diverticulosis (no adenomas since 2001)  . FRACTURE SURGERY Left 1967   foot and ankle surgery    Prior to Admission medications   Medication Sig Start Date End Date Taking? Authorizing Provider  aspirin 81 MG tablet Take 81 mg by mouth daily.      [provider]  azelastine (ASTELIN) 0.1 % nasal spray U 2 SPRAYS IEN Q 12 H. 05/30/16   [provider]  celecoxib (CELEBREX) 200 MG capsule TAKE 1 CAPSULE(200 MG) BY MOUTH TWICE DAILY 07/25/16   Bedsole, Amy E, MD  diclofenac sodium (VOLTAREN) 1 % GEL Apply 4 g topically 4 (four) times daily. 12/29/16   Bedsole, Amy E, MD  DIOVAN 80 MG tablet TAKE 1 TABLET BY MOUTH EVERY DAY 12/27/16   Bedsole, Amy E, MD  guaiFENesin-codeine (ROBITUSSIN AC) 100-10 MG/5ML syrup Take 5-10 mLs by mouth at bedtime as needed for cough. 09/17/15   Bedsole, Amy E, MD  loratadine (CLARITIN) 10 MG tablet Take 10 mg by mouth daily.    [provider]  NORVASC 10 MG tablet TAKE 1 TABLET(10 MG) BY MOUTH DAILY 07/14/16   Bedsole, Amy E, MD  Olopatadine HCl (PATADAY) 0.2 % SOLN Place 1 drop into both eyes daily. 12/23/14   Parrett, Tammy S, NP  pantoprazole (PROTONIX) 40 MG tablet TAKE 1 TABLET BY MOUTH TWICE DAILY 12/28/16   Bedsole, Amy E, MD  Red Yeast Rice 600 MG CAPS Take 4 capsules by mouth daily.     [provider]  VALIUM 2 MG tablet TAKE 1/2 TO 1 TABLET BY MOUTH EVERY 12 HOURS 12/12/16   Bedsole, Amy E, MD    Allergies Ace inhibitors; Neosporin  [neomycin-bacitracin zn-polymyx]; Statins; Tramadol; and Beta adrenergic blockers  Family History  Problem Relation Age of Onset  . Cancer Mother        colon   . Colon cancer Mother 29  . Heart disease Mother   . Hypertension Mother   . Heart disease Father        cad  . Deep vein thrombosis Father   . Heart attack Father   . Stomach cancer Sister   . Cancer Sister   . Diabetes Sister   . Heart disease Sister   . Hyperlipidemia Sister   . Hypertension Sister   . Diabetes Brother   . Heart disease Brother        Heart Disease before age 64  . Hyperlipidemia Brother   . Hypertension Brother   . Cancer Sister   . Heart disease Brother   . Heart attack Brother   . Heart disease Brother   . Heart disease Brother     Social History Social History  Substance Use Topics  . Smoking status: Never Smoker  . Smokeless tobacco: Never Used  . Alcohol use Yes     Comment: wine occassionally    Review of Systems  Constitutional: No fever/chills Eyes: No visual changes. ENT: No sore throat. Cardiovascular: Denies chest pain. Respiratory: Denies shortness of breath. Gastrointestinal: No abdominal pain.  No nausea, no vomiting.  No diarrhea.  No constipation. Genitourinary: Negative for dysuria. Musculoskeletal: Negative for back pain. Skin: Negative for rash. Neurological: Negative for focal weakness or numbness.   ____________________________________________   PHYSICAL EXAM:  VITAL SIGNS: ED Triage Vitals  Enc Vitals Group     BP 02/05/17 1323 (!) 207/96     Pulse Rate 02/05/17 1323 98     Resp 02/05/17 1323 (!) 22     Temp 02/05/17 1323 97.6 F (36.4 C)     Temp Source 02/05/17 1323 Oral     SpO2 02/05/17 1323 100 %     Weight 02/05/17 1321 160 lb (72.6 kg)     Height 02/05/17 1321 5\' 4"  (1.626 m)     Head Circumference --      Peak Flow --      Pain Score 02/05/17 1320 1     Pain Loc --      Pain Edu? --      Excl. in [place]? --     Constitutional: Alert  and oriented. Well appearing and in no acute distress. Eyes: Conjunctivae are normal.  Head: Atraumatic. Nose: No congestion/rhinnorhea. Mouth/Throat: Mucous membranes are moist.  Neck: No stridor.   Cardiovascular: Normal rate, regular rhythm. Grossly normal heart sounds.  Respiratory: Normal respiratory effort.  No retractions. Lungs CTAB. Gastrointestinal: Soft and nontender. No distention.  Musculoskeletal: No lower extremity tenderness nor edema.  No joint effusions. Neurologic:  Normal speech and language. No gross focal neurologic  deficits are appreciated. Skin:  Skin is warm, dry and intact. No rash noted. Psychiatric: Mood and affect are normal. Speech and behavior are normal.  ____________________________________________   LABS (all labs ordered are listed, but only abnormal results are displayed)  Labs Reviewed  BASIC METABOLIC PANEL - Abnormal; Notable for the following:       Result Value   BUN 21 (*)    All other components within normal limits  CBC WITH DIFFERENTIAL/PLATELET  TROPONIN I  TROPONIN I   ____________________________________________  EKG  ED ECG REPORT I, Libbie Bartley,  Youlanda Roys, the attending physician, personally viewed and interpreted this ECG.   Date: 02/05/2017  EKG Time: 1324  Rate: 92  Rhythm: normal sinus rhythm  Axis: normal  Intervals:none  ST&T Change: No ST segment elevation or depression. No abnormal T-wave inversion.  ____________________________________________  RADIOLOGY  No acute intracranial abnormality on head CT. 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 No acute findings on the chest x-ray. ____________________________________________   PROCEDURES  Procedure(s) performed:   Procedures  Critical Care performed:   ____________________________________________   INITIAL IMPRESSION / ASSESSMENT AND PLAN / ED COURSE  Pertinent labs & imaging results that were available during my care of the patient were reviewed  by me and considered in my medical decision making (see chart for details).  ----------------------------------------- 3:15 PM on 02/05/2017 -----------------------------------------  After medication the patient's blood pressure has dropped to the 130s and 140s over 50s and 60s. Patient is asymptomatic at this time. Plan on repeating her troponin at 4:30 PM. As long as the patient continues to feel at her baseline and her blood pressure remains reasonable than the plan will be to discharge to home. I discussed with patient and her husband is at the bedside. They're understanding and willing to comply. Signed out to Dr. Archie Balboa.      ____________________________________________   FINAL CLINICAL IMPRESSION(S) / ED DIAGNOSES  Final diagnoses:  Hypertension, unspecified type  Uncontrolled hypertension.    NEW MEDICATIONS STARTED DURING THIS VISIT:  New Prescriptions   No medications on file     Note:  This document was prepared using Dragon voice recognition software and may include unintentional dictation errors.     Orbie Pyo, MD 02/05/17 [phone]

## 2017-02-08 ENCOUNTER — Encounter: Payer: Self-pay | Admitting: Emergency Medicine

## 2017-02-08 ENCOUNTER — Emergency Department
Admission: EM | Admit: 2017-02-08 | Discharge: 2017-02-08 | Disposition: A | Discharge status: Home / Self Care | Payer: Medicare Other | Attending: Emergency Medicine | Admitting: Emergency Medicine

## 2017-02-08 ENCOUNTER — Telehealth: Payer: Self-pay

## 2017-02-08 DIAGNOSIS — Z7982 Long term (current) use of aspirin: Secondary | ICD-10-CM | POA: Insufficient documentation

## 2017-02-08 DIAGNOSIS — Z9049 Acquired absence of other specified parts of digestive tract: Secondary | ICD-10-CM | POA: Diagnosis not present

## 2017-02-08 DIAGNOSIS — Z79899 Other long term (current) drug therapy: Secondary | ICD-10-CM | POA: Diagnosis not present

## 2017-02-08 DIAGNOSIS — E039 Hypothyroidism, unspecified: Secondary | ICD-10-CM | POA: Insufficient documentation

## 2017-02-08 DIAGNOSIS — I10 Essential (primary) hypertension: Secondary | ICD-10-CM | POA: Diagnosis present

## 2017-02-08 DIAGNOSIS — F419 Anxiety disorder, unspecified: Secondary | ICD-10-CM

## 2017-02-08 DIAGNOSIS — I159 Secondary hypertension, unspecified: Secondary | ICD-10-CM

## 2017-02-08 LAB — BASIC METABOLIC PANEL
Anion gap: 10 (ref 5–15)
BUN: 25 mg/dL — AB (ref 6–20)
CALCIUM: 9.5 mg/dL (ref 8.9–10.3)
CO2: 25 mmol/L (ref 22–32)
CREATININE: 0.7 mg/dL (ref 0.44–1.00)
Chloride: 98 mmol/L — ABNORMAL LOW (ref 101–111)
GFR calc Af Amer: >60 mL/min (ref >60)
Glucose, Bld: 91 mg/dL (ref 65–99)
Potassium: 3.9 mmol/L (ref 3.5–5.1)
Sodium: 133 mmol/L — ABNORMAL LOW (ref 135–145)

## 2017-02-08 LAB — TROPONIN I: Troponin I: <0.03 ng/mL (ref <0.03)

## 2017-02-08 MED ORDER — LORAZEPAM 2 MG/ML IJ SOLN
1.0000 mg | Freq: Once | INTRAMUSCULAR | Status: AC
  Administered 2017-02-08: 1 mg via INTRAVENOUS
  Filled 2017-02-08: qty 1

## 2017-02-08 NOTE — ED Notes (Signed)
NAD noted at time of D/C. Pt denies questions or concerns. Pt taken to the lobby via wheelchair at this time.  

## 2017-02-08 NOTE — ED Notes (Signed)
Pt resting in bed at this time. Eyes closed, respirations even and unlabored, VSS and WNL. Will continue to monitor for further patient needs.

## 2017-02-08 NOTE — Telephone Encounter (Signed)
Per chart review tab pt went to [facility] ED. 

## 2017-02-08 NOTE — ED Provider Notes (Addendum)
[facility] Emergency Department Provider Note  ____________________________________________   I have reviewed the triage vital signs and the nursing notes.   HISTORY  Chief Complaint Hypertension    HPI [name] [name] is a 76 y.o. female  who presents today because her blood pressure is up. Patient has checked her blood pressure possibly 6 times in the course of the day. Since noon, every time she is checked and it has gone up. She essentially is asymptomatic with this she states sometimes he feels a little bit of "pressure" in her head when she gets her blood pressure up and not toxic and felt elevated. However, she is not having that at this moment but she is worried that it will come back. She states she is under great of stress because her husband has many health issues but she doesn't think that her stress is why her blood pressure is up. Patient was seen here 4 days ago and received Ativan and clonazepam and her blood pressure came down. Patient actually does endorse increased stress but does not think her stress has anything to do with her blood pressure. She denies chest pain or shortness of breath or headache. While in the room, her husband did follow me and states "this is all stress" he states that she's been helping her daughter with medical problems she's been helping him with medical problems and there is been some issues with other issues of a premonition between them. She has no SI or HI. He stated that she has been on a blood pressure cuff all day today. Patient has a follow-up on a tomorrow with her doctor. She had a negative CT and extensive workup when she was here a few days ago for similar.      Past Medical History:  Diagnosis Date  . Arthritis   . Chronic lower back pain   . Diverticulitis   . GERD (gastroesophageal reflux disease)   . Hemorrhoids, internal, with bleeding   . Hiatal hernia   . History of colon polyps   . HLD  (hyperlipidemia)   . Hypertension   . OSA (obstructive sleep apnea)   . PUD (peptic ulcer disease)   . Scoliosis   . Thyroid disease    hypo  . Thyroid nodule   . Vaginitis, atrophic   . Vertigo     Patient Active Problem List   Diagnosis Date Noted  . Puncture wound 01/05/2017  . Chronic insomnia 01/02/2017  . Counseling regarding end of life decision making 12/18/2014  . Raynaud phenomenon 08/31/2014  . Chronic allergic conjunctivitis 03/15/2014  . 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 Chronic back pain greater than 3 months duration 08/13/2012  . S/P ankle fusion 02/06/2011  . TEMPOROMANDIBULAR JOINT PAIN 05/20/2010  . Carotid stenosis 06/30/2009  . Multiple thyroid nodules 03/15/2009  . Obstructive sleep apnea 03/05/2009  . HEMORRHOIDS, INTERNAL, WITH BLEEDING 05/09/2007  . VERTIGO 12/18/2006  . COLONIC POLYPS 12/17/2006  . HYPERCHOLESTEROLEMIA 12/17/2006  . Essential hypertension, benign 12/17/2006  . DIVERTICULAR DISEASE 12/17/2006  . VAGINITIS, ATROPHIC 12/17/2006  . Osteoarthritis, generalized 12/17/2006  . SCOLIOSIS 12/17/2006  . Seasonal and perennial allergic rhinitis 12/17/2006    Past Surgical History:  Procedure Laterality Date  . ANKLE FUSION  1967   LEFT  . CHOLECYSTECTOMY     2004  . COLONOSCOPY  06/22/2011   diverticulosis (no adenomas since 2001)  . FRACTURE SURGERY Left 1967   foot and ankle surgery    Prior  to Admission medications   Medication Sig Start Date End Date Taking? Authorizing Provider  aspirin 81 MG tablet Take 81 mg by mouth daily.      [provider]  azelastine (ASTELIN) 0.1 % nasal spray U 2 SPRAYS IEN Q 12 H. 05/30/16   [provider]  celecoxib (CELEBREX) 200 MG capsule TAKE 1 CAPSULE(200 MG) BY MOUTH TWICE DAILY 07/25/16   Bedsole, Amy E, MD  diclofenac sodium (VOLTAREN) 1 % GEL Apply 4 g topically 4 (four) times daily. 12/29/16   Bedsole, Amy E, MD  DIOVAN 80 MG tablet TAKE 1  TABLET BY MOUTH EVERY DAY 12/27/16   Bedsole, Amy E, MD  guaiFENesin-codeine (ROBITUSSIN AC) 100-10 MG/5ML syrup Take 5-10 mLs by mouth at bedtime as needed for cough. 09/17/15   Bedsole, Amy E, MD  loratadine (CLARITIN) 10 MG tablet Take 10 mg by mouth daily.    [provider]  NORVASC 10 MG tablet TAKE 1 TABLET(10 MG) BY MOUTH DAILY 07/14/16   Bedsole, Amy E, MD  Olopatadine HCl (PATADAY) 0.2 % SOLN Place 1 drop into both eyes daily. 12/23/14   Parrett, Tammy S, NP  pantoprazole (PROTONIX) 40 MG tablet TAKE 1 TABLET BY MOUTH TWICE DAILY 12/28/16   Bedsole, Amy E, MD  Red Yeast Rice 600 MG CAPS Take 4 capsules by mouth daily.     [provider]  VALIUM 2 MG tablet TAKE 1/2 TO 1 TABLET BY MOUTH EVERY 12 HOURS 12/12/16   Bedsole, Amy E, MD    Allergies Ace inhibitors; Neosporin [neomycin-bacitracin zn-polymyx]; Statins; Tramadol; and Beta adrenergic blockers  Family History  Problem Relation Age of Onset  . Cancer Mother        colon   . Colon cancer Mother 53  . Heart disease Mother   . Hypertension Mother   . Heart disease Father        cad  . Deep vein thrombosis Father   . Heart attack Father   . Stomach cancer Sister   . Cancer Sister   . Diabetes Sister   . Heart disease Sister   . Hyperlipidemia Sister   . Hypertension Sister   . Diabetes Brother   . Heart disease Brother        Heart Disease before age 42  . Hyperlipidemia Brother   . Hypertension Brother   . Cancer Sister   . Heart disease Brother   . Heart attack Brother   . Heart disease Brother   . Heart disease Brother     Social History Social History  Substance Use Topics  . Smoking status: Never Smoker  . Smokeless tobacco: Never Used  . Alcohol use Yes     Comment: wine occassionally    Review of Systems Constitutional: No fever/chills Eyes: No visual changes. ENT: No sore throat. No stiff neck no neck pain Cardiovascular: Denies chest pain. Respiratory: Denies shortness of  breath. Gastrointestinal:   no vomiting.  No diarrhea.  No constipation. Genitourinary: Negative for dysuria. Musculoskeletal: Negative lower extremity swelling Skin: Negative for rash. Neurological: Negative for severe headaches, focal weakness or numbness.   ____________________________________________   PHYSICAL EXAM:  VITAL SIGNS: ED Triage Vitals [02/08/17 1414]  Enc Vitals Group     BP (!) 186/74     Pulse Rate 87     Resp 16     Temp 98 F (36.7 C)     Temp Source Oral     SpO2 100 %  Weight      Height      Head Circumference      Peak Flow      Pain Score      Pain Loc      Pain Edu?      Excl. in [place]?     Constitutional: Alert and oriented. Well appearing and in no acute distress. Eyes: Conjunctivae are normal Head: Atraumatic HEENT: No congestion/rhinnorhea. Mucous membranes are moist.  Oropharynx non-erythematous Neck:   Nontender with no meningismus, no masses, no stridor Cardiovascular: Normal rate, regular rhythm. Grossly normal heart sounds.  Good peripheral circulation. Respiratory: Normal respiratory effort.  No retractions. Lungs CTAB. Abdominal: Soft and nontender. No distention. No guarding no rebound Back:  There is no focal tenderness or step off.  there is no midline tenderness there are no lesions noted. there is no CVA tenderness Musculoskeletal: No lower extremity tenderness, no upper extremity tenderness. No joint effusions, no DVT signs strong distal pulses no edema Neurologic:  Normal speech and language. No gross focal neurologic deficits are appreciated.  Skin:  Skin is warm, dry and intact. No rash noted. Psychiatric: Mood and affect are anxious. Speech and behavior are normal.  ____________________________________________   LABS (all labs ordered are listed, but only abnormal results are displayed)  Labs Reviewed  BASIC METABOLIC PANEL  TROPONIN I   ____________________________________________  EKG  I personally  interpreted any EKGs ordered by me or triage Sinus rhythm at 90 bpm no acute ST elevation or acute ST depression normal axis unremarkable EKG, ____________________________________________  RADIOLOGY  I reviewed any imaging ordered by me or triage that were performed during my shift and, if possible, patient and/or family made aware of any abnormal findings. ____________________________________________   PROCEDURES  Procedure(s) performed: None  Procedures  Critical Care performed: None  ____________________________________________   INITIAL IMPRESSION / ASSESSMENT AND PLAN / ED COURSE  Pertinent labs & imaging results that were available during my care of the patient were reviewed by me and considered in my medical decision making (see chart for details).  Patient here for hypertension which I feel is likely mediated by her anxiety. Husband agrees. Patient is reluctant to endorse this as a probable cause. We will give her Ativan as I think that will help her more than anything. I am very reluctant rto treat this with antihypertensive medication s she's been well-controlled on her home blood pressure medication for years, and only in this time of anxiety as she had elevated blood pressure. She has no SI or HI, there is no reason to think she needs to see a psychiatrist, she will see her primary care doctor tomorrow. I don't think she is having any evidence of hypertensive crisis but we will make sure her kidney function is preserved and we will see about trying to get her safely home as soon as safely possible  ----------------------------------------- 5:39 PM on 02/08/2017 -----------------------------------------  Renal function is normal, blood pressure is in the 150s which is her baseline after 1 mg of Ativan she is resting comfortably. We did change the monitor so she could not watchher blood pressure being taken as I feel this is anxiety provoking to her. We'll continue to  observe her, she has no complains at this time.    ____________________________________________   FINAL CLINICAL IMPRESSION(S) / ED DIAGNOSES  Final diagnoses:  None      This chart was dictated using voice recognition software.  Despite best efforts to proofread,  errors can  occur which can change meaning.      Schuyler Amor, MD 02/08/17 1641    Schuyler Amor, MD 02/08/17 1739    Schuyler Amor, MD 02/08/17 [phone]

## 2017-02-08 NOTE — ED Triage Notes (Signed)
Pt comes into the ED via POV from home c/o hypertension that has been ongoing since Monday.  Patient was seen Monday and told to keep track of her BP.  Patient's B{P increasing throughout today.  Denies any chest pain, shortness of breath, or dizziness.  Patient allergic to most BP medications.  Patient in NAD at this time with even and unlabored respirations.

## 2017-02-08 NOTE — Telephone Encounter (Signed)
PLEASE NOTE: All timestamps contained within this report are represented as [place] Standard Time. CONFIDENTIALTY NOTICE: This fax transmission is intended only for the addressee. It contains information that is legally privileged, confidential or otherwise protected from use or disclosure. If you are not the intended recipient, you are strictly prohibited from reviewing, disclosing, copying using or disseminating any of this information or taking any action in reliance on or regarding this information. If you have received this fax in error, please notify us immediately by telephone so that we can arrange for its return to [place]. Phone: [phone], Toll-Free: [phone], Fax: [phone] Page: 1 of 2 Call Id: [id number] [place] Patient Name: [name] [name] Gender: Female DOB: 1941-04-17 Age: 76 Y 74 M 19 D Return Phone Number: [phone] (Primary) Address: [address] City/State/Zip: [place] [address] Client [facility] - Client Client [facility] - Day Physician Eliezer Lofts - MD Who Is Calling Patient / Member / Family / Caregiver Call Type Triage / Clinical [name] Name Grady Relationship To Patient Self Return Phone Number [phone] (Primary) Chief Complaint BLOOD PRESSURE HIGH - Systolic (top number) [phone] or greater (with symptoms) Reason for Call Symptomatic / Request for [name] states that her blood pressure is rising. The last blood pressure reading was 186/91. She has shortness of breath as well. She was seen at the ER on Monday regarding her blood pressure. She took her Bp medication this morning and states that she usually takes it at night. Appointment Disposition EMR Appointment Attempted - Not Scheduled Info pasted into Epic Yes Nurse Assessment Nurse: Mallie Mussel, RN, Alveta Heimlich  Date/Time Eilene Ghazi Time): 02/08/2017 1:13:20 PM Confirm and document reason for call. If symptomatic, describe symptoms. ---[name] states that her BP is 187/101. She has been under treatment for HTN for over 20 years. She is on the same medications as usual, nothing has changed. She has SOB even at rest. Heart rate is 89. She is speaking in complete sentences. Does the PT have any chronic conditions? (i.e. diabetes, asthma, etc.) ---Yes List chronic conditions. ---HTN, Carotid artery blockage. Guidelines Guideline Title Affirmed Question Breathing Difficulty [1] MILD difficulty breathing (e.g., minimal/no SOB at rest, SOB with walking, pulse <100) AND [2] NEW-onset or WORSE than normal Disp. Time Eilene Ghazi Time) Disposition Final User 02/08/2017 1:21:32 PM See Physician within 4 Hours (or PCP triage) Yes Mallie Mussel, RN, [facility] - ED Care Advice Given Per Guideline SEE PHYSICIAN WITHIN 4 HOURS (or PCP triage): * You become worse. PLEASE NOTE: All timestamps contained within this report are represented as [place] Standard Time. CONFIDENTIALTY NOTICE: This fax transmission is intended only for the addressee. It contains information that is legally privileged, confidential or otherwise protected from use or disclosure. If you are not the intended recipient, you are strictly prohibited from reviewing, disclosing, copying using or disseminating any of this information or taking any action in reliance on or regarding this information. If you have received this fax in error, please notify us immediately by telephone so that we can arrange for its return to [place]. Phone: [phone], Toll-Free: [phone], Fax: [phone] Page: 2 of 2 Call Id: [id number]

## 2017-02-09 ENCOUNTER — Ambulatory Visit (INDEPENDENT_AMBULATORY_CARE_PROVIDER_SITE_OTHER): Payer: Medicare Other | Admitting: Family Medicine

## 2017-02-09 ENCOUNTER — Encounter: Payer: Self-pay | Admitting: Family Medicine

## 2017-02-09 VITALS — BP 140/60 | HR 72 | Ht 64.0 in | Wt 159.5 lb

## 2017-02-09 DIAGNOSIS — F418 Other specified anxiety disorders: Secondary | ICD-10-CM | POA: Insufficient documentation

## 2017-02-09 DIAGNOSIS — I6521 Occlusion and stenosis of right carotid artery: Secondary | ICD-10-CM

## 2017-02-09 DIAGNOSIS — I10 Essential (primary) hypertension: Secondary | ICD-10-CM | POA: Diagnosis not present

## 2017-02-09 NOTE — Progress Notes (Signed)
   Subjective:    Patient ID: [name] [name], female    DOB: 13-Jul-1941, 76 y.o.   MRN: [id number]  HPI   76 year old female presents for follow up ER visit for increased BP and increase stress and anxiety.  Seen in ER 2 times in last week for increased BP.   notes reviewed in detail. BP in ER 186-207/ 74-96. Associated with dizziness, head pressure,   No CP, no SOB, no vision, no neuro changes. No recent change in activity, foods, meds, no increase in salt in diet.  Some increase in stress with husband and daughter's health. Had not missed BP meds, on norvasc 10 mg, diovan 80 mg Improved with rest and ativan at ER. BMET, troponin I and cbc negative except Na 133 on 6/7 EKG unremarkable x 2   NML thyroid testing 12/26/2016. BP Readings from Last 3 Encounters:  02/09/17 140/60  02/08/17 138/66  02/05/17 134/63   Today she reports:   Still  Having some pressure in posterior head. No dizziness. BP at home 138-165/78-90  She is using valium 1-2 mg prn  muscle spasm.. Using  Daily mainly for back muscles at nighttime. Review of Systems  Constitutional: Negative for fatigue and fever.  HENT: Negative for ear pain.   Eyes: Negative for pain.  Respiratory: Negative for chest tightness and shortness of breath.   Cardiovascular: Negative for chest pain, palpitations and leg swelling.  Gastrointestinal: Negative for abdominal pain.  Genitourinary: Negative for dysuria.       Objective:   Physical Exam  Constitutional: Vital signs are normal. She appears well-developed and well-nourished. She is cooperative.  Non-toxic appearance. She does not appear ill. No distress.  HENT:  Head: Normocephalic.  Right Ear: Hearing, tympanic membrane, external ear and ear canal normal. Tympanic membrane is not erythematous, not retracted and not bulging.  Left Ear: Hearing, tympanic membrane, external ear and ear canal normal. Tympanic membrane is not erythematous, not retracted and not bulging.    Nose: No mucosal edema or rhinorrhea. Right sinus exhibits no maxillary sinus tenderness and no frontal sinus tenderness. Left sinus exhibits no maxillary sinus tenderness and no frontal sinus tenderness.  Mouth/Throat: Uvula is midline, oropharynx is clear and moist and mucous membranes are normal.  Eyes: Conjunctivae, EOM and lids are normal. Pupils are equal, round, and reactive to light. Lids are everted and swept, no foreign bodies found.  Neck: Trachea normal and normal range of motion. Neck supple. Carotid bruit is not present. No thyroid mass and no thyromegaly present.  Cardiovascular: Normal rate, regular rhythm, S1 normal, S2 normal, normal heart sounds, intact distal pulses and normal pulses.  Exam reveals no gallop and no friction rub.   No murmur heard. Pulmonary/Chest: Effort normal and breath sounds normal. No tachypnea. No respiratory distress. She has no decreased breath sounds. She has no wheezes. She has no rhonchi. She has no rales.  Abdominal: Soft. Normal appearance and bowel sounds are normal. There is no tenderness.  Neurological: She is alert.  Skin: Skin is warm, dry and intact. No rash noted.  Psychiatric: Her speech is normal and behavior is normal. Judgment and thought content normal. Her mood appears not anxious. Cognition and memory are normal. She does not exhibit a depressed mood.          Assessment & Plan:

## 2017-02-09 NOTE — Patient Instructions (Addendum)
Work on stress reduction.  Increase exercise as able.  If BP spikes up.. Can use additional dose of 1/2 tab of diovan. Continue to follow BP daily.Marland Kitchen if remaining up we may need to have you stay on 1.5 tabs of Diovan daily. IF this is the case, call for further recommendation prescription.

## 2017-02-09 NOTE — Assessment & Plan Note (Signed)
Blood pressure improved.  No clear other secondary cause other than increase in stress and situational anxiety.  Pt can use additional 1/2 tab daily of diovan prn BP spikes... If this is daily... Return for repeat BMET and change in rx.

## 2017-02-09 NOTE — Assessment & Plan Note (Signed)
Pt refuses  SSRI or other long-term treatment for anxiety. Will work on stress reduction and relaxation.

## 2017-02-12 ENCOUNTER — Telehealth: Payer: Self-pay

## 2017-02-12 NOTE — Telephone Encounter (Signed)
Pt called to give update to Dr Diona Browner; pt was seen 02/09/17; on 02/10/17 BP was 175/95 and pt took meds as instructed by Dr Diona Browner and rested; when rechecked BP the BP was 140/86. If pt is sitting or laying she has no dizziness but when pt is up and moving around or getting up out of chair pt gets very dizzy.pt is afraid she might fall; pt cannot do her usual activities. Advised pt to get up very slowly when getting out of chair or bed and pt voiced understanding. Today BP 146/78 P 80. Pt does not want to change any meds until pt is seen on 02/16/17 by Dr Diona Browner; pt will rest as much as possible until 02/16/17. Pt will cb if needed. FYI to Dr Diona Browner.

## 2017-02-13 MED ORDER — VALSARTAN 40 MG PO TABS: 120.0000 mg | ORAL_TABLET | Freq: Every day | ORAL | 5 refills | Status: DC

## 2017-02-13 MED ORDER — DIOVAN 40 MG PO TABS: 120.0000 mg | ORAL_TABLET | Freq: Every day | ORAL | 5 refills | Status: DC

## 2017-02-13 NOTE — Telephone Encounter (Signed)
Pt called this morning; pt has been resting since pt called on 02/12/17 and feels better today; less dizziness;pt has been cutting the Diovan 80 mg in half but the cut is not even and pt request new rx of Diovan 40 mg taking 3 tabs daily to [facility] [address] Pt is going to see how she feels on 02/15/17 and will decide if going to keep appt on 02/16/17.Please advise.

## 2017-02-13 NOTE — Telephone Encounter (Signed)
Okay to refill as requested.

## 2017-02-13 NOTE — Telephone Encounter (Signed)
Rx for Diovan 40 mg (Brand Name Only) sent into [facility] as instructed by Dr. Diona Browner.  Called and left message at [facility] to cancel generic Rx originally sent in and to fill the Brand Name Only prescription.  Mrs. Abbey notified prescription has been sent to her pharmacy.

## 2017-02-14 ENCOUNTER — Ambulatory Visit: Payer: Medicare Other | Admitting: Podiatry

## 2017-02-16 ENCOUNTER — Ambulatory Visit: Payer: Medicare Other | Admitting: Family Medicine

## 2017-02-19 ENCOUNTER — Encounter: Payer: Self-pay | Admitting: Podiatry

## 2017-02-19 ENCOUNTER — Ambulatory Visit (INDEPENDENT_AMBULATORY_CARE_PROVIDER_SITE_OTHER): Payer: Medicare Other | Admitting: Podiatry

## 2017-02-19 VITALS — BP 156/73 | HR 70 | Resp 16

## 2017-02-19 DIAGNOSIS — B351 Tinea unguium: Secondary | ICD-10-CM

## 2017-02-19 DIAGNOSIS — M79676 Pain in unspecified toe(s): Secondary | ICD-10-CM | POA: Diagnosis not present

## 2017-02-19 DIAGNOSIS — Q828 Other specified congenital malformations of skin: Secondary | ICD-10-CM

## 2017-02-19 NOTE — Progress Notes (Signed)
She presents today chief complaint of painful elongated toenails corns and calluses.  Objective: She presents today vital signs stable alert and oriented 3. Pulses are palpable. Neurologic sensorium is intact. Deep tendon reflexes are intact. Muscle strength is normal bilateral. Toenails are long thick yellow dystrophic painful in nature. Plantar flex second metatarsal reactive hyperkeratosis noted plantar aspect of the bilateral second metatarsal.  Assessment: Pain in limb secondary to plantar porokeratotic lesions as well as pain in limb segment onychomycosis.  Plan: Debrided all reactive hyperkeratotic tissue place padding and debrided toenails 1 through 5.

## 2017-02-23 ENCOUNTER — Ambulatory Visit: Payer: Medicare Other | Admitting: Family Medicine

## 2017-03-01 ENCOUNTER — Ambulatory Visit: Payer: Medicare Other | Admitting: Family Medicine

## 2017-03-01 ENCOUNTER — Other Ambulatory Visit: Payer: Self-pay | Admitting: Family Medicine

## 2017-03-01 DIAGNOSIS — I6523 Occlusion and stenosis of bilateral carotid arteries: Secondary | ICD-10-CM

## 2017-03-05 ENCOUNTER — Ambulatory Visit (INDEPENDENT_AMBULATORY_CARE_PROVIDER_SITE_OTHER): Payer: Medicare Other | Admitting: Obstetrics & Gynecology

## 2017-03-05 ENCOUNTER — Encounter: Payer: Self-pay | Admitting: Obstetrics & Gynecology

## 2017-03-05 VITALS — BP 150/80 | HR 81 | Ht 64.0 in | Wt 162.0 lb

## 2017-03-05 DIAGNOSIS — Z1211 Encounter for screening for malignant neoplasm of colon: Secondary | ICD-10-CM

## 2017-03-05 DIAGNOSIS — Z0000 Encounter for general adult medical examination without abnormal findings: Secondary | ICD-10-CM

## 2017-03-05 DIAGNOSIS — Z01419 Encounter for gynecological examination (general) (routine) without abnormal findings: Secondary | ICD-10-CM | POA: Diagnosis not present

## 2017-03-05 DIAGNOSIS — Z1239 Encounter for other screening for malignant neoplasm of breast: Secondary | ICD-10-CM

## 2017-03-05 DIAGNOSIS — N9089 Other specified noninflammatory disorders of vulva and perineum: Secondary | ICD-10-CM | POA: Insufficient documentation

## 2017-03-05 DIAGNOSIS — N952 Postmenopausal atrophic vaginitis: Secondary | ICD-10-CM

## 2017-03-05 NOTE — Patient Instructions (Signed)
PAP every three years Mammogram every year Colonoscopy every 10 years Labs yearly (with PCP)  Call [phone] for mammogram scheduling

## 2017-03-05 NOTE — Progress Notes (Addendum)
HPI:      [name] is a 76 y.o.  who LMP was in the past, she presents today for her examination.  The patient has no complaints today other than continued left sided vulvar lesion as previously identified 2 mos ago, may be a little bigger no but no pain, redness, discharge. The patient is not sexually active. Herlast pap: approximate date 2016 and was normal and last mammogram: approximate date 2016 and was normal.  The patient does perform self breast exams.  There is no notable family history of breast or ovarian cancer in her family. The patient is not taking hormone replacement therapy. Patient denies post-menopausal vaginal bleeding.   The patient has regular exercise: yes. The patient denies current symptoms of depression.    GYN Hx: Last Colonoscopy:5 years ago. Normal.  Last DEXA: never ago.    PMHx: Past Medical History:  Diagnosis Date  . Arthritis   . Chronic lower back pain   . Diverticulitis   . GERD (gastroesophageal reflux disease)   . Hemorrhoids, internal, with bleeding   . Hiatal hernia   . History of colon polyps   . HLD (hyperlipidemia)   . Hypertension   . OSA (obstructive sleep apnea)   . PUD (peptic ulcer disease)   . Scoliosis   . Thyroid disease    hypo  . Thyroid nodule   . Vaginitis, atrophic   . Vertigo    Past Surgical History:  Procedure Laterality Date  . ANKLE FUSION  1967   LEFT  . CHOLECYSTECTOMY     2004  . COLONOSCOPY  06/22/2011   diverticulosis (no adenomas since 2001)  . FRACTURE SURGERY Left 1967   foot and ankle surgery   Family History  Problem Relation Age of Onset  . Cancer Mother        colon   . Colon cancer Mother 28  . Heart disease Mother   . Hypertension Mother   . Heart disease Father        cad  . Deep vein thrombosis Father   . Heart attack Father   . Stomach cancer Sister   . Cancer Sister   . Diabetes Sister   . Heart disease Sister   . Hyperlipidemia Sister   . Hypertension Sister   . Diabetes  Brother   . Heart disease Brother        Heart Disease before age 12  . Hyperlipidemia Brother   . Hypertension Brother   . Cancer Sister   . Heart disease Brother   . Heart attack Brother   . Heart disease Brother   . Heart disease Brother    Social History  Substance Use Topics  . Smoking status: Never Smoker  . Smokeless tobacco: Never Used  . Alcohol use Yes     Comment: wine occassionally    Current Outpatient Prescriptions:  .  aspirin 81 MG tablet, Take 81 mg by mouth daily.  , Disp: , Rfl:  .  azelastine (ASTELIN) 0.1 % nasal spray, U 2 SPRAYS IEN Q 12 H., Disp: , Rfl: 5 .  celecoxib (CELEBREX) 200 MG capsule, TAKE 1 CAPSULE(200 MG) BY MOUTH TWICE DAILY, Disp: 60 capsule, Rfl: 5 .  diclofenac sodium (VOLTAREN) 1 % GEL, Apply 4 g topically 4 (four) times daily., Disp: 100 g, Rfl: 1 .  DIOVAN 40 MG tablet, Take 3 tablets (120 mg total) by mouth daily., Disp: 90 tablet, Rfl: 5 .  guaiFENesin-codeine (ROBITUSSIN AC) 100-10 MG/5ML  syrup, Take 5-10 mLs by mouth at bedtime as needed for cough., Disp: 180 mL, Rfl: 0 .  loratadine (CLARITIN) 10 MG tablet, Take 10 mg by mouth daily., Disp: , Rfl:  .  NORVASC 10 MG tablet, TAKE 1 TABLET(10 MG) BY MOUTH DAILY, Disp: 30 tablet, Rfl: 5 .  Olopatadine HCl (PATADAY) 0.2 % SOLN, Place 1 drop into both eyes daily., Disp: 2.5 mL, Rfl: 5 .  pantoprazole (PROTONIX) 40 MG tablet, TAKE 1 TABLET BY MOUTH TWICE DAILY, Disp: 180 tablet, Rfl: 1 .  Red Yeast Rice 600 MG CAPS, Take 4 capsules by mouth daily. , Disp: , Rfl:  .  VALIUM 2 MG tablet, TAKE 1/2 TO 1 TABLET BY MOUTH EVERY 12 HOURS, Disp: 60 tablet, Rfl: 0 Allergies: Ace inhibitors; Neosporin [neomycin-bacitracin zn-polymyx]; Statins; Tramadol; and Beta adrenergic blockers  Review of Systems  Constitutional: Negative for chills, fever and malaise/fatigue.  HENT: Negative for congestion, sinus pain and sore throat.   Eyes: Negative for blurred vision and pain.  Respiratory: Negative for  cough and wheezing.   Cardiovascular: Negative for chest pain and leg swelling.  Gastrointestinal: Negative for abdominal pain, constipation, diarrhea, heartburn, nausea and vomiting.  Genitourinary: Negative for dysuria, frequency, hematuria and urgency.  Musculoskeletal: Negative for back pain, joint pain, myalgias and neck pain.  Skin: Negative for itching and rash.  Neurological: Negative for dizziness, tremors and weakness.  Endo/Heme/Allergies: Does not bruise/bleed easily.  Psychiatric/Behavioral: Negative for depression. The patient is not nervous/anxious and does not have insomnia.    Objective: BP (!) 150/80   Pulse 81   Ht 5\' 4"  (1.626 m)   Wt 162 lb (73.5 kg)   BMI 27.81 kg/m   Filed Weights   03/05/17 0836  Weight: 162 lb (73.5 kg)   Body mass index is 27.81 kg/m. Physical Exam  Constitutional: She is oriented to person, place, and time. She appears well-developed and well-nourished. No distress.  Genitourinary: Rectum normal, vagina normal and uterus normal. Pelvic exam was performed with patient supine. There is no rash or lesion on the right labia. There is no rash or lesion on the left labia. Vagina exhibits no lesion. No bleeding in the vagina. Right adnexum does not display mass and does not display tenderness. Left adnexum does not display mass and does not display tenderness. Cervix does not exhibit motion tenderness, lesion, friability or polyp.   Uterus is mobile and midaxial. Uterus is not enlarged or exhibiting a mass.  Genitourinary Comments: Atrophy of vagina External pimple like lesion on left labia  HENT:  Head: Normocephalic and atraumatic. Head is without laceration.  Right Ear: Hearing normal.  Left Ear: Hearing normal.  Nose: No epistaxis.  No foreign bodies.  Mouth/Throat: Uvula is midline, oropharynx is clear and moist and mucous membranes are normal.  Eyes: Pupils are equal, round, and reactive to light.  Neck: Normal range of motion. Neck  supple. No thyromegaly present.  Cardiovascular: Normal rate and regular rhythm.  Exam reveals no gallop and no friction rub.   No murmur heard. Pulmonary/Chest: Effort normal and breath sounds normal. No respiratory distress. She has no wheezes. Right breast exhibits no mass, no skin change and no tenderness. Left breast exhibits no mass, no skin change and no tenderness.  Abdominal: Soft. Bowel sounds are normal. She exhibits no distension. There is no tenderness. There is no rebound.  Musculoskeletal: Normal range of motion.  Neurological: She is alert and oriented to person, place, and time. No cranial nerve  deficit.  Skin: Skin is warm and dry.  Psychiatric: She has a normal mood and affect. Judgment normal.  Vitals reviewed.  Assessment:  1. Atrophic Vaginitis  2. Screening for breast cancer   3. Menopause  4. Vulvar lesion, left sided  5. Screen for colon cancer     Plan:            1.  Cervical Screening-  Pap smear schedule reviewed with patient, not due this year  2. Breast screening- Exam annually and mammogram scheduled  3. Colonoscopy every 10 years, Hemoccult testing after age 78- stool cards given to patient today; she will reach out to GI who did last colonoscopy to see when due again.  4. Labs managed by PCP  5. Counseling for hormonal therapy: none, no change in therapy today  6. Vulvar lesion, monitor, no visual or palpable concerns  7. Vaginal atrophy, no treatment desired     F/U  Return in about 1 year (around 03/05/2018) or as needed  Barnett Applebaum, MD, [place], [facility] 03/05/2017  9:08 AM

## 2017-03-08 ENCOUNTER — Other Ambulatory Visit: Payer: Self-pay | Admitting: Family Medicine

## 2017-03-09 NOTE — Telephone Encounter (Signed)
Last office visit 02/09/2017.  Last refilled 12/12/2016 for #60 with no refills.  Ok to refill?

## 2017-03-09 NOTE — Telephone Encounter (Signed)
Valium called into [facility] - [place], [place] - [address] Phone: [phone] 

## 2017-04-06 ENCOUNTER — Telehealth: Payer: Self-pay

## 2017-04-06 NOTE — Telephone Encounter (Signed)
PA completed on CoverMyMeds.  Sent for review.  Can take up to 72 hours for a decision. 

## 2017-04-06 NOTE — Telephone Encounter (Signed)
Pt said that she needs a prior auth for Diovan 40 mg taking 3 tabs by mouth daily. Ins will only approve 80 mg daily. [facility] on [address] [name] [name] request cb. Pt needs to pick up more med on 04/07/17. Request call to [facility] should authorize today. Phone # [phone].

## 2017-04-09 NOTE — Telephone Encounter (Signed)
PA approved through 09/03/2017.  [name] [name] notified by telephone of the PA approval.

## 2017-04-16 ENCOUNTER — Ambulatory Visit: Payer: Medicare Other

## 2017-04-16 DIAGNOSIS — I6523 Occlusion and stenosis of bilateral carotid arteries: Secondary | ICD-10-CM | POA: Diagnosis not present

## 2017-04-17 LAB — VAS US CAROTID
LCCADDIAS: -11 cm/s
LCCAPSYS: 118 cm/s
LEFT ECA DIAS: 0 cm/s
LEFT VERTEBRAL DIAS: 0 cm/s
LICADDIAS: -22 cm/s
LICADSYS: -88 cm/s
LICAPSYS: -52 cm/s
Left CCA dist sys: -81 cm/s
Left CCA prox dias: 13 cm/s
Left ICA prox dias: -11 cm/s
RIGHT ECA DIAS: -9 cm/s
RIGHT VERTEBRAL DIAS: 0 cm/s
Right CCA prox dias: 0 cm/s
Right CCA prox sys: 125 cm/s
Right cca dist sys: -114 cm/s

## 2017-04-23 ENCOUNTER — Encounter: Payer: Self-pay | Admitting: Podiatry

## 2017-04-23 ENCOUNTER — Ambulatory Visit (INDEPENDENT_AMBULATORY_CARE_PROVIDER_SITE_OTHER): Payer: Medicare Other | Admitting: Podiatry

## 2017-04-23 DIAGNOSIS — B351 Tinea unguium: Secondary | ICD-10-CM | POA: Diagnosis not present

## 2017-04-23 DIAGNOSIS — M79676 Pain in unspecified toe(s): Secondary | ICD-10-CM

## 2017-04-23 DIAGNOSIS — Q828 Other specified congenital malformations of skin: Secondary | ICD-10-CM

## 2017-04-23 NOTE — Progress Notes (Signed)
She presents today with chief complaint of painful elongated toenails. States that since she's been wearing the pads or calluses have resolved.  Objective: Vital signs are stable alert and oriented 3 pulses are palpable. Toenails are long yellow thick dystrophic onychomycotic subungual debris and brittle. No calluses are visualized.  Assessment: Pain in limb secondary to onychomycosis.  Plan: Debridement of toenails bilateral. Follow up with her in 2 months

## 2017-04-24 ENCOUNTER — Telehealth: Payer: Self-pay

## 2017-04-24 NOTE — Telephone Encounter (Signed)
Patient states her cyst that was evaluated at her last visit by Dr. Kenton Kingfisher has increased in size.  She wants it removed.  The first available appointment is Sept 4th.  Patient wants to know if it is okay for her to wait until then or would she need to be seen before that.  Also patient would like results from her labs from her last appointment as well.

## 2017-04-24 NOTE — Telephone Encounter (Signed)
Please advise 

## 2017-04-24 NOTE — Telephone Encounter (Signed)
OK to wait No stool card results yet (did she do?) No other labs done that I can see

## 2017-04-25 NOTE — Telephone Encounter (Signed)
No results pending in [facility], pt may need to repeat test

## 2017-04-25 NOTE — Telephone Encounter (Signed)
lmtrc

## 2017-04-25 NOTE — Telephone Encounter (Signed)
Pt Is calling back for an missed call. Please advise

## 2017-04-26 NOTE — Telephone Encounter (Signed)
Phone line busy.

## 2017-04-26 NOTE — Telephone Encounter (Signed)
Called pt , pt was out of the house at the moment, if she calls back please tell her to come get another stool card, the lab never got sample ,

## 2017-05-07 ENCOUNTER — Other Ambulatory Visit: Payer: Self-pay | Admitting: Family Medicine

## 2017-05-07 NOTE — Telephone Encounter (Signed)
Last office visit 02/09/17.   Last refilled 03/09/17 for #60 with no refills.   Ok to  Refill?

## 2017-05-08 ENCOUNTER — Encounter: Payer: Self-pay | Admitting: Obstetrics & Gynecology

## 2017-05-08 ENCOUNTER — Ambulatory Visit (INDEPENDENT_AMBULATORY_CARE_PROVIDER_SITE_OTHER): Payer: Medicare Other | Admitting: Obstetrics & Gynecology

## 2017-05-08 VITALS — BP 130/80 | HR 55 | Ht 64.0 in | Wt 166.0 lb

## 2017-05-08 DIAGNOSIS — N9089 Other specified noninflammatory disorders of vulva and perineum: Secondary | ICD-10-CM

## 2017-05-08 DIAGNOSIS — L720 Epidermal cyst: Secondary | ICD-10-CM

## 2017-05-08 NOTE — Addendum Note (Signed)
Addended by: Gae Dry on: 05/08/2017 11:32 AM   Modules accepted: Orders

## 2017-05-08 NOTE — Progress Notes (Signed)
VULVAR BIOPSY NOTE The indications for vulvar biopsy (rule out neoplasia, establish lichen sclerosus diagnosis) were reviewed.   Risks of the biopsy including pain, bleeding, infection, inadequate specimen, scarring and need for additional procedures  were discussed. The patient stated understanding and agreed to undergo procedure today. Consent was signed,  time out performed.   The patient's vulva was prepped with Betadine. 1% lidocaine was injected into area of concern where a LEFT SIDED 7-70mm firm NT lesion is noted. A 5 -mm punch biopsy was done with complete excision of lesion with help of scapel and forceps to encompass the entirity of the lesion, biopsy tissue was picked up with sterile forceps and sterile scissors were used to excise the lesion.  Small bleeding was noted and hemostasis was achieved using a 3-0 Vicryl Suture.  The patient tolerated the procedure well. Post-procedure instructions  (pelvic rest for one week) were given to the patient. The patient is to call with heavy bleeding, fever greater than 100.4, foul smelling vaginal discharge or other concerns.   F/U One week to check healing and review pathology.  Barnett Applebaum, MD, [facility], [facility] 05/08/2017  9:16 AM

## 2017-05-08 NOTE — Patient Instructions (Signed)
Vulva Biopsy, Care After These instructions give you information about caring for yourself after your procedure. Your doctor may also give you more specific instructions. Call your doctor if you have any problems or questions after your procedure. Follow these instructions at home: Biopsy Site Care   Do not rub the biopsy area after peeing (urinating). Gently: ? Pat the area dry. Or, use a bottle filled with warm water (peri-bottle) to clean the area. ? Wipe from front to back.  Follow instructions from your doctor about how to take care of your biopsy site. Make sure you: ? Clean the area using water and mild soap twice a day or as told by your doctor. Gently pat the area dry. ? If you were prescribed an antibiotic medical ointment, apply it as told by your doctor. Do not stop using the antibiotic even if your condition gets better. ? Take a warm water bath that is taken while you are sitting down (sitz bath). Do this as needed to help with pain. ? Leave stitches (sutures), skin glue, or skin tape (adhesive) strips in place. They may need to stay in place for 2 weeks or longer. If tape strips get loose and curl up, you may trim the loose edges. Do not remove tape strips completely unless your doctor says it is okay.  Check your biopsy site every day for signs of infection. Check for: ? More redness, swelling, or pain. ? More fluid or blood. ? Warmth. ? Pus or a bad smell. Lifestyle  Wear loose, cotton underwear.  Do not wear tight pants.  Do not use a tampon, douche, or put anything in your vagina for at least one week or until your doctor says it is okay.  Do not have sex for at least one week or until your doctor says it is okay.  Do not exercise until your doctor says it is okay.  Do not take baths, swim, or use a hot tub until your doctor says it is okay. General instructions  Take over-the-counter and prescription medicines only as told by your doctor.  Use a sanitary pad  until bleeding stops.  Keep all follow-up visits as told by your doctor. This is important.  If the sample is being sent for testing, it is your responsibility to get the results of your procedure. Ask your doctor or the department doing the procedure when your results will be ready. Contact a doctor if:  You have more redness, swelling, or pain around your biopsy site.  You have more fluid or blood coming from your biopsy site.  Your biopsy site feels warm when you touch it.  Medicine does not help your pain. Get help right away if:  You have a lot of bleeding from the vulva.  You have pus or a bad smell coming from your biopsy site.  You have a fever.  You have lower belly pain. This information is not intended to replace advice given to you by your health care provider. Make sure you discuss any questions you have with your health care provider. Document Released: 11/17/2008 Document Revised: 01/27/2016 Document Reviewed: 07/12/2015 Elsevier Interactive Patient Education  2018 Elsevier Inc.  

## 2017-05-08 NOTE — Telephone Encounter (Signed)
Valium called into [facility] - [place], [place] - [place] Phone: [phone]

## 2017-05-11 ENCOUNTER — Encounter: Payer: Self-pay | Admitting: Family Medicine

## 2017-05-11 ENCOUNTER — Ambulatory Visit (INDEPENDENT_AMBULATORY_CARE_PROVIDER_SITE_OTHER): Payer: Medicare Other | Admitting: Family Medicine

## 2017-05-11 VITALS — BP 126/80 | HR 75 | Temp 97.3°F | Ht 64.0 in | Wt 166.2 lb

## 2017-05-11 DIAGNOSIS — R3915 Urgency of urination: Secondary | ICD-10-CM

## 2017-05-11 DIAGNOSIS — M62830 Muscle spasm of back: Secondary | ICD-10-CM | POA: Diagnosis not present

## 2017-05-11 LAB — POC URINALSYSI DIPSTICK (AUTOMATED)
Bilirubin, UA: NEGATIVE
Blood, UA: NEGATIVE
GLUCOSE UA: NEGATIVE
Ketones, UA: NEGATIVE
Leukocytes, UA: NEGATIVE
Nitrite, UA: NEGATIVE
Protein, UA: NEGATIVE
SPEC GRAV UA: 1.025 (ref 1.010–1.025)
UROBILINOGEN UA: 0.2 U/dL
pH, UA: 6 (ref 5.0–8.0)

## 2017-05-11 LAB — PATHOLOGY

## 2017-05-11 NOTE — Assessment & Plan Note (Signed)
No sign of UTI.  Likely due to recent exercise change.  Treat with heat, tylenol and gentle stretching.

## 2017-05-11 NOTE — Patient Instructions (Signed)
Apply heat on mid-back, tylenol for pain, gentle stretching.  Avoid bending over repetitively, heavy lifting.  Call if rash appears.

## 2017-05-11 NOTE — Progress Notes (Signed)
Subjective:    Patient ID: [name] [name], female    DOB: 1940-11-07, 76 y.o.   MRN: [id number]  Flank Pain  This is a new problem. The current episode started 1 to 4 weeks ago. The problem has been waxing and waning since onset. The pain is present in the thoracic spine. The pain does not radiate. The pain is moderate. Associated symptoms include abdominal pain. Pertinent negatives include no chest pain, dysuria or fever. (2-3  Weeks of urgency and frequency No straining to urinates, no blood in urine  nml BMs) Risk factors include lack of exercise and menopause (no falls, recently started back to exercise.). She has tried nothing for the symptoms.  She has been fatigued.  No recent antibiotics.   No hx of recurrent UTI , last was years ago  No recent cath or uro procedure.   Review of Systems  Constitutional: Negative for fatigue and fever.  HENT: Negative for ear pain.   Eyes: Negative for pain.  Respiratory: Negative for chest tightness and shortness of breath.   Cardiovascular: Negative for chest pain, palpitations and leg swelling.  Gastrointestinal: Positive for abdominal pain.  Genitourinary: Positive for flank pain. Negative for dysuria.       Objective:   Physical Exam  Constitutional: Vital signs are normal. She appears well-developed and well-nourished. She is cooperative.  Non-toxic appearance. She does not appear ill. No distress.  HENT:  Head: Normocephalic.  Right Ear: Hearing, tympanic membrane, external ear and ear canal normal. Tympanic membrane is not erythematous, not retracted and not bulging.  Left Ear: Hearing, tympanic membrane, external ear and ear canal normal. Tympanic membrane is not erythematous, not retracted and not bulging.  Nose: No mucosal edema or rhinorrhea. Right sinus exhibits no maxillary sinus tenderness and no frontal sinus tenderness. Left sinus exhibits no maxillary sinus tenderness and no frontal sinus tenderness.  Mouth/Throat: Uvula  is midline, oropharynx is clear and moist and mucous membranes are normal.  Eyes: Pupils are equal, round, and reactive to light. Conjunctivae, EOM and lids are normal. Lids are everted and swept, no foreign bodies found.  Neck: Trachea normal and normal range of motion. Neck supple. Carotid bruit is not present. No thyroid mass and no thyromegaly present.  Cardiovascular: Normal rate, regular rhythm, S1 normal, S2 normal, normal heart sounds, intact distal pulses and normal pulses.  Exam reveals no gallop and no friction rub.   No murmur heard. Pulmonary/Chest: Effort normal and breath sounds normal. No tachypnea. No respiratory distress. She has no decreased breath sounds. She has no wheezes. She has no rhonchi. She has no rales.  Abdominal: Soft. Normal appearance and bowel sounds are normal. There is generalized tenderness. There is no rigidity, no rebound, no guarding and no CVA tenderness.  Pt always has mild tendernes sin abdomne.. Not new for her  Musculoskeletal:       Thoracic back: Normal. She exhibits normal range of motion, no tenderness and no bony tenderness.       Lumbar back: Normal. She exhibits normal range of motion.  No pain pon exam... But sharp pain when sitting up from table on left mid back.  Neurological: She is alert.  Skin: Skin is warm, dry and intact. No rash noted.  Psychiatric: Her speech is normal and behavior is normal. Judgment and thought content normal. Her mood appears not anxious. Cognition and memory are normal. She does not exhibit a depressed mood.          Assessment &  Plan:

## 2017-05-14 ENCOUNTER — Ambulatory Visit (INDEPENDENT_AMBULATORY_CARE_PROVIDER_SITE_OTHER): Payer: Medicare Other | Admitting: Obstetrics & Gynecology

## 2017-05-14 ENCOUNTER — Encounter: Payer: Self-pay | Admitting: Obstetrics & Gynecology

## 2017-05-14 VITALS — BP 130/80 | HR 73 | Ht 64.0 in | Wt 166.0 lb

## 2017-05-14 DIAGNOSIS — N907 Vulvar cyst: Secondary | ICD-10-CM | POA: Diagnosis not present

## 2017-05-14 DIAGNOSIS — I6523 Occlusion and stenosis of bilateral carotid arteries: Secondary | ICD-10-CM | POA: Diagnosis not present

## 2017-05-14 NOTE — Progress Notes (Signed)
   Follow-up Patient presents following excision of left vulvar lesion for wjhat turns out to be an Inclusion Cyst (was painful and bothersome to patient), 1 week ago.  Subjective: Patient reports marked improvement in her discomfort.  Some bleeding yesterdat at site.  Bruising..   Objective: BP 130/80   Pulse 73   Ht 5\' 4"  (1.626 m)   Wt 166 lb (75.3 kg)   BMI 28.49 kg/m  Physical Exam  Constitutional: She is oriented to person, place, and time. She appears well-developed and well-nourished. No distress.  Genitourinary: Vagina normal. Pelvic exam was performed with patient supine. There is no rash, tenderness or lesion on the right labia. There is no rash, tenderness or lesion on the left labia. No erythema or bleeding in the vagina.  Genitourinary Comments: Lesion biopsy site healing well.  Some left sided bruising noted.  Abdominal: Soft. She exhibits no distension. There is no tenderness.  Musculoskeletal: Normal range of motion.  Neurological: She is alert and oriented to person, place, and time. No cranial nerve deficit.  Skin: Skin is warm and dry.  Psychiatric: She has a normal mood and affect.    Assessment: Inclusion Cyst Left Vulva, Excised.  Plan: Patient has done well after procedure and is healing well Pathology discussed Return as needed  Activity plan: No restriction.  Hoyt Koch 05/14/2017, 9:14 AM

## 2017-05-15 ENCOUNTER — Ambulatory Visit: Payer: Medicare Other | Admitting: Obstetrics & Gynecology

## 2017-06-21 ENCOUNTER — Encounter: Payer: Self-pay | Admitting: Family Medicine

## 2017-06-21 ENCOUNTER — Ambulatory Visit (INDEPENDENT_AMBULATORY_CARE_PROVIDER_SITE_OTHER): Payer: Medicare Other | Admitting: Family Medicine

## 2017-06-21 VITALS — BP 130/62 | HR 73 | Temp 97.4°F | Ht 64.0 in | Wt 161.8 lb

## 2017-06-21 DIAGNOSIS — Z23 Encounter for immunization: Secondary | ICD-10-CM

## 2017-06-21 DIAGNOSIS — I6523 Occlusion and stenosis of bilateral carotid arteries: Secondary | ICD-10-CM

## 2017-06-21 DIAGNOSIS — R0989 Other specified symptoms and signs involving the circulatory and respiratory systems: Secondary | ICD-10-CM | POA: Diagnosis not present

## 2017-06-21 DIAGNOSIS — R0602 Shortness of breath: Secondary | ICD-10-CM | POA: Diagnosis not present

## 2017-06-21 DIAGNOSIS — E042 Nontoxic multinodular goiter: Secondary | ICD-10-CM | POA: Diagnosis not present

## 2017-06-21 MED ORDER — HYDROCHLOROTHIAZIDE 25 MG PO TABS: 25.0000 mg | ORAL_TABLET | Freq: Every day | ORAL | 1 refills | Status: DC | PRN

## 2017-06-21 NOTE — Assessment & Plan Note (Signed)
Seems to be more restrictive when pressure on abdomen and positional. Not clearly related to choking sensation or sleep apnea.  Given intermittant swelling issues... Will eval with [facility] of heart. MAy need further eval with labs, but given intermittent.. Doubt new thyroid issue or anemia.

## 2017-06-21 NOTE — Patient Instructions (Addendum)
Can use HCTZ as needed for swelling. Please stop at the front desk to set up referral.

## 2017-06-21 NOTE — Progress Notes (Signed)
   Subjective:    Patient ID: [name] [name], female    DOB: 30-Jun-1941, 76 y.o.   MRN: [id number]  HPI    76 year old female pt presents with new onset  Shortness of breath and sensation of choking.  She reports SOB in last few months, worse with sitting and  lying down and in  tight pants. Has gradually been worsening.  no paroxsysmal  Nocturnal dyspnea.   USes CPAP at night. No cough, no fever, no congestion. No chest pain,  Some dyspnea on exertion but not much. Stable swelling, off and on occurs if she eats out.  Occ liquids getting stuck, coughing.    She feels pressure on anterior neck.  She is worried her thyroid nodules have enlarged.  Hx of multinodular thyroid.       Review of Systems  Constitutional: Negative for fatigue and fever.  HENT: Negative for congestion.   Eyes: Negative for pain.  Respiratory: Positive for shortness of breath. Negative for cough.   Cardiovascular: Positive for leg swelling. Negative for chest pain and palpitations.  Gastrointestinal: Positive for abdominal distention. Negative for abdominal pain.  Genitourinary: Negative for dysuria and vaginal bleeding.  Musculoskeletal: Negative for back pain.  Neurological: Negative for syncope, light-headedness and headaches.  Psychiatric/Behavioral: Negative for dysphoric mood.       Objective:   Physical Exam  Constitutional: Vital signs are normal. She appears well-developed and well-nourished. She is cooperative.  Non-toxic appearance. She does not appear ill. No distress.  HENT:  Head: Normocephalic.  Right Ear: Hearing, tympanic membrane, external ear and ear canal normal. Tympanic membrane is not erythematous, not retracted and not bulging.  Left Ear: Hearing, tympanic membrane, external ear and ear canal normal. Tympanic membrane is not erythematous, not retracted and not bulging.  Nose: No mucosal edema or rhinorrhea. Right sinus exhibits no maxillary sinus tenderness and no frontal  sinus tenderness. Left sinus exhibits no maxillary sinus tenderness and no frontal sinus tenderness.  Mouth/Throat: Uvula is midline, oropharynx is clear and moist and mucous membranes are normal.  Eyes: Pupils are equal, round, and reactive to light. Conjunctivae, EOM and lids are normal. Lids are everted and swept, no foreign bodies found.  Neck: Trachea normal and normal range of motion. Neck supple. Carotid bruit is not present. No thyroid mass and no thyromegaly present.  Cardiovascular: Normal rate, regular rhythm, S1 normal, S2 normal, normal heart sounds, intact distal pulses and normal pulses.  Exam reveals no gallop and no friction rub.   No murmur heard. Pulmonary/Chest: Effort normal and breath sounds normal. No tachypnea. No respiratory distress. She has no decreased breath sounds. She has no wheezes. She has no rhonchi. She has no rales.  Abdominal: Soft. Normal appearance and bowel sounds are normal. There is no tenderness.  Neurological: She is alert.  Skin: Skin is warm, dry and intact. No rash noted.  Psychiatric: Her speech is normal and behavior is normal. Judgment and thought content normal. Her mood appears not anxious. Cognition and memory are normal. She does not exhibit a depressed mood.          Assessment & Plan:

## 2017-06-21 NOTE — Assessment & Plan Note (Signed)
Start eval with thyroid US, but if negative consider swallowing eval. Pt is not a good historian.

## 2017-06-21 NOTE — Addendum Note (Signed)
Addended by: Carter Kitten on: 06/21/2017 11:45 AM   Modules accepted: Orders

## 2017-06-25 ENCOUNTER — Ambulatory Visit (INDEPENDENT_AMBULATORY_CARE_PROVIDER_SITE_OTHER): Payer: Medicare Other

## 2017-06-25 ENCOUNTER — Other Ambulatory Visit: Payer: Self-pay

## 2017-06-25 ENCOUNTER — Ambulatory Visit: Payer: Medicare Other | Admitting: Podiatry

## 2017-06-25 DIAGNOSIS — R0602 Shortness of breath: Secondary | ICD-10-CM | POA: Diagnosis not present

## 2017-06-27 ENCOUNTER — Ambulatory Visit: Payer: Medicare Other

## 2017-06-27 ENCOUNTER — Telehealth: Payer: Self-pay | Admitting: Family Medicine

## 2017-06-27 NOTE — Telephone Encounter (Signed)
Please cancel the Thyroid US order for the patient. She is having it done at Dr [facility] office on 07/02/17.

## 2017-06-28 ENCOUNTER — Other Ambulatory Visit: Payer: Self-pay | Admitting: Family Medicine

## 2017-06-28 NOTE — Addendum Note (Signed)
Addended by: Eliezer Lofts E on: 06/28/2017 09:53 AM   Modules accepted: Orders

## 2017-06-28 NOTE — Telephone Encounter (Signed)
done

## 2017-07-03 ENCOUNTER — Other Ambulatory Visit: Payer: Self-pay | Admitting: Family Medicine

## 2017-07-04 ENCOUNTER — Ambulatory Visit (INDEPENDENT_AMBULATORY_CARE_PROVIDER_SITE_OTHER): Payer: Medicare Other | Admitting: Podiatry

## 2017-07-04 DIAGNOSIS — B351 Tinea unguium: Secondary | ICD-10-CM | POA: Diagnosis not present

## 2017-07-04 DIAGNOSIS — M79676 Pain in unspecified toe(s): Secondary | ICD-10-CM | POA: Diagnosis not present

## 2017-07-04 NOTE — Progress Notes (Signed)
She presents today with a chief complaint of painful elongated toenails which are incurvated margins.  Objective: Vital signs are stable alert and oriented 3. Pulses are palpable. 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 Mild edema lateral ankles. No open lesions or wounds.  Assessment: Diabetic with painful mycotic nails.  Plan: Debridement of toenails 1 through 5 bilateral.

## 2017-07-04 NOTE — Telephone Encounter (Signed)
Valium called into [facility] - [place], [place] - [place] Phone: [phone]

## 2017-07-04 NOTE — Telephone Encounter (Signed)
Last office visit 06/21/2017.  Last refilled 05/08/2017 for #60 with no refills.  Ok to refill?

## 2017-07-06 ENCOUNTER — Telehealth: Payer: Self-pay | Admitting: Family Medicine

## 2017-07-06 NOTE — Telephone Encounter (Signed)
[name] [name] is requesting copy of the ECHO report.  Copy printed and placed up front for patient to pick up at the front desk.

## 2017-07-06 NOTE — Telephone Encounter (Signed)
Copied from [place] [id number]. Topic: Quick Communication - Other Results >> Jul 06, 2017 10:39 AM Cleaster Corin, NT wrote: CRM for notification. See Telephone encounter for:  07/06/17. Pt would like to know results and have paperwork from heart ultrasound she will be able to pick it up sometime this afternoon if avaible

## 2017-07-12 ENCOUNTER — Other Ambulatory Visit: Payer: Self-pay | Admitting: Family Medicine

## 2017-07-23 ENCOUNTER — Ambulatory Visit: Payer: Medicare Other | Admitting: Podiatry

## 2017-08-08 ENCOUNTER — Telehealth: Payer: Self-pay | Admitting: Internal Medicine

## 2017-08-08 NOTE — Telephone Encounter (Signed)
Patient wants to know if she can pick up a compliance report for cpap prior to am appt with Dr. Rockey Situ tomorrow please call

## 2017-08-08 NOTE — Telephone Encounter (Signed)
Spoke with pt and informed her that last updated report in [facility] is last used 02/21/17. Informed her that she will need to contact [facility] about checking her machine to get a report. Pt states her breathing is getting more rapid when she is wearing her CPAP machine. appt scheduled for her to come in and see DR for possible adjustments to CPAP machine. Nothing further needed.

## 2017-08-08 NOTE — Progress Notes (Signed)
Cardiology Office Note  Date:  08/09/2017   ID:  [name] [name], DOB [birth year], MRN [id number]  PCP:  Jinny Sanders, MD   Chief Complaint  Patient presents with  . Other    New patient. Referred by Dr Diona Browner. pt has leaky valve. Patient c/o cramping under left breast and SOB.  Meds reviewed verbally with patient.     HPI:  [name] [name] is a 76 year old woman with past medical history of Peripheral arterial disease Hyperlipidemia Carotid disease 60% to 70% on the right Hypertension Episodes of dizziness Shortness of breath, leg swelling Who presents by referral from primary care for mitral valve disorder  Dizzy spells when she gets up, Worse after bad night sleep Does not think it is  Vertigo Reports balance is poor, legs weak, no regular exercise program  Taking Norvasc 5 mg Has noticed increasing lower extremity edema Does not take HCTZ Feels her blood pressure has been running high  EKG personally reviewed by myself on todays visit Shows normal sinus rhythm weight and 73 bpm no significant ST or T wave changes  Recent echocardiogram results reviewed with her in detail Left ventricle: The cavity size was normal. Systolic function was   normal. The estimated ejection fraction was in the range of 60%   to 65%. Wall motion was normal; there were no regional wall   motion abnormalities. Doppler parameters are consistent with   abnormal left ventricular relaxation (grade 1 diastolic   dysfunction). - Mitral valve: There was mild regurgitation. - Left atrium: The atrium was normal in size. - Right ventricle: Systolic function was normal. - Pulmonary arteries: Systolic pressure was midlly elevated. PA   peak pressure: 39 mm Hg (S).   PMH:   has a past medical history of Arthritis, Chronic lower back pain, Diverticulitis, GERD (gastroesophageal reflux disease), Hemorrhoids, internal, with bleeding, Hiatal hernia, History of colon polyps, HLD (hyperlipidemia), Hypertension,  OSA (obstructive sleep apnea), PUD (peptic ulcer disease), Scoliosis, Thyroid disease, Thyroid nodule, Vaginitis, atrophic, and Vertigo.  PSH:    Past Surgical History:  Procedure Laterality Date  . ANKLE FUSION  1967   LEFT  . CHOLECYSTECTOMY     2004  . COLONOSCOPY  06/22/2011   diverticulosis (no adenomas since 2001)  . FRACTURE SURGERY Left 1967   foot and ankle surgery    Current Outpatient Medications  Medication Sig Dispense Refill  . aspirin 81 MG tablet Take 81 mg by mouth daily.      Marland Kitchen azelastine (ASTELIN) 0.1 % nasal spray U 2 SPRAYS IEN Q 12 H.  5  . celecoxib (CELEBREX) 200 MG capsule TAKE 1 CAPSULE(200 MG) BY MOUTH TWICE DAILY 60 capsule 5  . diclofenac sodium (VOLTAREN) 1 % GEL Apply 4 g topically 4 (four) times daily. 100 g 1  . DIOVAN 40 MG tablet Take 3 tablets (120 mg total) by mouth daily. 90 tablet 5  . ezetimibe (ZETIA) 10 MG tablet Take 1 tablet (10 mg total) by mouth daily. 90 tablet 3  . guaiFENesin-codeine (ROBITUSSIN AC) 100-10 MG/5ML syrup Take 5-10 mLs by mouth at bedtime as needed for cough. 180 mL 0  . hydrochlorothiazide (HYDRODIURIL) 25 MG tablet Take 1 tablet (25 mg total) by mouth daily as needed. 30 tablet 1  . loratadine (CLARITIN) 10 MG tablet Take 10 mg by mouth daily.    . NORVASC 10 MG tablet TAKE 1 TABLET(10 MG) BY MOUTH DAILY 90 tablet 1  . Olopatadine HCl (PATADAY) 0.2 % SOLN Place 1  drop into both eyes daily. 2.5 mL 5  . pantoprazole (PROTONIX) 40 MG tablet TAKE 1 TABLET BY MOUTH TWICE DAILY 180 tablet 1  . Red Yeast Rice 600 MG CAPS Take 4 capsules by mouth daily.     Marland Kitchen VALIUM 2 MG tablet TAKE 1/2 TO 1 TABLET BY MOUTH EVERY 12 HOURS 60 tablet 0   No current facility-administered medications for this visit.      Allergies:   Ace inhibitors; Neosporin [neomycin-bacitracin zn-polymyx]; Statins; Tramadol; and Beta adrenergic blockers   Social History:  The patient  reports that  has never smoked. she has never used smokeless tobacco. She  reports that she drinks alcohol. She reports that she does not use drugs.   Family History:   family history includes Cancer in her mother, sister, and sister; Colon cancer (age of onset: 47) in her mother; Deep vein thrombosis in her father; Diabetes in her brother and sister; Heart attack in her brother and father; Heart disease in her brother, brother, brother, brother, father, mother, and sister; Hyperlipidemia in her brother and sister; Hypertension in her brother, mother, and sister; Stomach cancer in her sister.    Review of Systems: Review of Systems  Constitutional: Negative.   Respiratory: Negative.   Cardiovascular: Negative.   Gastrointestinal: Negative.   Musculoskeletal: Negative.   Neurological: Negative.   Psychiatric/Behavioral: Negative.   All other systems reviewed and are negative.    PHYSICAL EXAM: VS:  BP (!) 156/80 (BP Location: Left Arm, Patient Position: Sitting, Cuff Size: Normal)   Pulse 73   Ht 5\' 4"  (1.626 m)   Wt 161 lb (73 kg)   BMI 27.64 kg/m  , BMI Body mass index is 27.64 kg/m.  Difficulty getting down from the exam table, wobbly  GEN: Well nourished, well developed, in no acute distress  HEENT: normal  Neck: no JVD, carotid bruits, or masses Cardiac: RRR; no murmurs, rubs, or gallops, trace pitting lower extremity edema to the lotions Respiratory:  clear to auscultation bilaterally, normal work of breathing GI: soft, nontender, nondistended, + BS MS: no deformity or atrophy  Skin: warm and dry, no rash Neuro:  Strength and sensation are intact Psych: euthymic mood, full affect    Peripheral arterial disease recent Labs: 12/26/2016: ALT 19; TSH 1.22 02/05/2017: Hemoglobin 14.9; Platelets 280 02/08/2017: BUN 25; Creatinine, Ser 0.70; Potassium 3.9; Sodium 133    Lipid Panel Lab Results  Component Value Date   CHOL 184 12/26/2016   HDL 69.20 12/26/2016   LDLCALC 97 12/26/2016   TRIG 92.0 12/26/2016      Wt Readings from Last 3  Encounters:  08/09/17 161 lb (73 kg)  06/21/17 161 lb 12 oz (73.4 kg)  05/14/17 166 lb (75.3 kg)       ASSESSMENT AND PLAN:  Bilateral carotid artery stenosis - Plan: EKG 12-Lead 60-70% disease on the right Stressed importance of aggressive lipid management  Mixed hyperlipidemia - Plan: EKG 12-Lead Suggested she add Zetia 10 mg daily to reach goal LDL less than 70 Statin intolerance If not close to goal, she may benefit from repatha or praluent  Shortness of breath - Plan: NM Myocar Multi W/Spect W/Wall Motion / EF Etiology unclear, given significant peripheral arterial disease there is concern for coronary ischemia.  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  We have recommended pharmacologic Myoview  Lower extremity edema Uncertain if she has fluid retention and diastolic CHF or complication from amlodipine She has high fluid intake daily,  lots of water Recommend she try to moderate her fluid intake (which may be contributing to low sodium) And suggest she start HCTZ daily with potassium 10 mEq  Gait instability  Tremendous gait instability moving around the room, suspect this could be contributing to her "dizziness feeling".  Blood pressure stable, less likely orthostasis or arrhythmia    Total encounter time more than 60 minutes  Greater than 50% was spent in counseling and coordination of care with the patient      Orders Placed This Encounter  Procedures  . NM Myocar Multi W/Spect W/Wall Motion / EF  . EKG 12-Lead     Signed, Esmond Plants, M.D., Ph.D. 08/09/2017  [place], [place]

## 2017-08-09 ENCOUNTER — Other Ambulatory Visit: Payer: Self-pay | Admitting: Family Medicine

## 2017-08-09 ENCOUNTER — Encounter: Payer: Self-pay | Admitting: Cardiovascular Disease

## 2017-08-09 ENCOUNTER — Ambulatory Visit (INDEPENDENT_AMBULATORY_CARE_PROVIDER_SITE_OTHER): Payer: Medicare Other | Admitting: Cardiovascular Disease

## 2017-08-09 VITALS — BP 156/80 | HR 73 | Ht 64.0 in | Wt 161.0 lb

## 2017-08-09 DIAGNOSIS — I10 Essential (primary) hypertension: Secondary | ICD-10-CM | POA: Diagnosis not present

## 2017-08-09 DIAGNOSIS — R2681 Unsteadiness on feet: Secondary | ICD-10-CM | POA: Diagnosis not present

## 2017-08-09 DIAGNOSIS — R600 Localized edema: Secondary | ICD-10-CM

## 2017-08-09 DIAGNOSIS — E7800 Pure hypercholesterolemia, unspecified: Secondary | ICD-10-CM | POA: Diagnosis not present

## 2017-08-09 DIAGNOSIS — I6523 Occlusion and stenosis of bilateral carotid arteries: Secondary | ICD-10-CM

## 2017-08-09 DIAGNOSIS — I739 Peripheral vascular disease, unspecified: Secondary | ICD-10-CM | POA: Diagnosis not present

## 2017-08-09 DIAGNOSIS — E782 Mixed hyperlipidemia: Secondary | ICD-10-CM

## 2017-08-09 DIAGNOSIS — R0602 Shortness of breath: Secondary | ICD-10-CM | POA: Diagnosis not present

## 2017-08-09 MED ORDER — EZETIMIBE 10 MG PO TABS: 10.0000 mg | ORAL_TABLET | Freq: Every day | ORAL | 11 refills | Status: DC

## 2017-08-09 MED ORDER — EZETIMIBE 10 MG PO TABS: 10.0000 mg | ORAL_TABLET | Freq: Every day | ORAL | 3 refills | Status: DC

## 2017-08-09 NOTE — Patient Instructions (Addendum)
Medication Instructions:   Please try HCTZ daily Add potassium daily  Try to decrease the free water  Consider starting zetia one a day for cholesterol and carotid build up  Labwork:  No new labs needed  Testing/Procedures:  We will schedule a pharmacologic Myoview stress test for shortness of breath, unable to treadmill  Your physician has requested that you have a lexiscan myoview. For further information please visit [email or web address]. Please follow instruction sheet, as given.  [name] [name]  Your caregiver has ordered a Stress Test with nuclear imaging. The purpose of this test is to evaluate the blood supply to your heart muscle. This procedure is referred to as a "Non-Invasive Stress Test." This is because other than having an IV started in your vein, nothing is inserted or "invades" your body. Cardiac stress tests are done to find areas of poor blood flow to the heart by determining the extent of coronary artery disease (CAD). Some patients exercise on a treadmill, which naturally increases the blood flow to your heart, while others who are  unable to walk on a treadmill due to physical limitations have a pharmacologic/chemical stress agent called Lexiscan . This medicine will mimic walking on a treadmill by temporarily increasing your coronary blood flow.   Please note: these test may take anywhere between 2-4 hours to complete  PLEASE REPORT TO [facility] AT THE FIRST DESK WILL DIRECT YOU WHERE TO GO  Date of Procedure:_____01/10/2019__________  Arrival Time for Procedure:____08:45 am___________  Instructions regarding medication:   _X_:  Hold other medications as follows:______Hydrochlorothiazide________  PLEASE NOTIFY THE OFFICE AT LEAST 24 HOURS IN ADVANCE IF YOU ARE UNABLE TO KEEP YOUR APPOINTMENT.  [phone] AND  PLEASE NOTIFY NUCLEAR MEDICINE AT [facility] AT LEAST 24 HOURS IN ADVANCE IF YOU ARE UNABLE TO KEEP YOUR APPOINTMENT.  [phone]  How to prepare for your Myoview test:  1. Do not eat or drink after midnight 2. No caffeine for 24 hours prior to test 3. No smoking 24 hours prior to test. 4. Your medication may be taken with water.  If your doctor stopped a medication because of this test, do not take that medication. 5. Ladies, please do not wear dresses.  Skirts or pants are appropriate. Please wear a short sleeve shirt. 6. No perfume, cologne or lotion. 7. Wear comfortable walking shoes. No heels!        Follow-Up: It was a pleasure seeing you in the office today. Please call us if you have new issues that need to be addressed before your next appt.  [phone]  Your physician wants you to follow-up in:  As needed  If you need a refill on your cardiac medications before your next appointment, please call your pharmacy.   Cardiac Nuclear Scan A cardiac nuclear scan is a test that measures blood flow to the heart when a person is resting and when he or she is exercising. The test looks for problems such as:  Not enough blood reaching a portion of the heart.  The heart muscle not working normally.  You may need this test if:  You have heart disease.  You have had abnormal lab results.  You have had heart surgery or angioplasty.  You have chest pain.  You have shortness of breath.  In this test, a radioactive dye (tracer) is injected into your bloodstream. After the tracer has traveled to your heart, an imaging device is used to measure how much of the tracer is  absorbed by or distributed to various areas of your heart. This procedure is usually done at a hospital and takes 2-4 hours. Tell a health care provider about:  Any allergies you have.  All medicines you are taking, including vitamins, herbs, eye drops, creams, and over-the-counter medicines.  Any problems you or family members have had with the use of anesthetic medicines.  Any blood disorders you have.  Any surgeries  you have had.  Any medical conditions you have.  Whether you are pregnant or may be pregnant. What are the risks? Generally, this is a safe procedure. However, problems may occur, including:  Serious chest pain and heart attack. This is only a risk if the stress portion of the test is done.  Rapid heartbeat.  Sensation of warmth in your chest. This usually passes quickly.  What happens before the procedure?  Ask your health care provider about changing or stopping your regular medicines. This is especially important if you are taking diabetes medicines or blood thinners.  Remove your jewelry on the day of the procedure. What happens during the procedure?  An IV tube will be inserted into one of your veins.  Your health care provider will inject a small amount of radioactive tracer through the tube.  You will wait for 20-40 minutes while the tracer travels through your bloodstream.  Your heart activity will be monitored with an electrocardiogram (ECG).  You will lie down on an exam table.  Images of your heart will be taken for about 15-20 minutes.  You may be asked to exercise on a treadmill or stationary bike. While you exercise, your heart's activity will be monitored with an ECG, and your blood pressure will be checked. If you are unable to exercise, you may be given a medicine to increase blood flow to parts of your heart.  When blood flow to your heart has peaked, a tracer will again be injected through the IV tube.  After 20-40 minutes, you will get back on the exam table and have more images taken of your heart.  When the procedure is over, your IV tube will be removed. The procedure may vary among health care providers and hospitals. Depending on the type of tracer used, scans may need to be repeated 3-4 hours later. What happens after the procedure?  Unless your health care provider tells you otherwise, you may return to your normal schedule, including diet,  activities, and medicines.  Unless your health care provider tells you otherwise, you may increase your fluid intake. This will help flush the contrast dye from your body. Drink enough fluid to keep your urine clear or pale yellow.  It is up to you to get your test results. Ask your health care provider, or the department that is doing the test, when your results will be ready. Summary  A cardiac nuclear scan measures the blood flow to the heart when a person is resting and when he or she is exercising.  You may need this test if you are at risk for heart disease.  Tell your health care provider if you are pregnant.  Unless your health care provider tells you otherwise, increase your fluid intake. This will help flush the contrast dye from your body. Drink enough fluid to keep your urine clear or pale yellow. This information is not intended to replace advice given to you by your health care provider. Make sure you discuss any questions you have with your health care provider. Document Released: 09/15/2004  Document Revised: 08/23/2016 Document Reviewed: 07/30/2013 Elsevier Interactive Patient Education  2017 [employer].

## 2017-08-09 NOTE — Telephone Encounter (Signed)
Last office visit 06/21/2017.  Last refilled 07/04/2017 for #60 with no refills.  Ok to refill?

## 2017-08-10 NOTE — Telephone Encounter (Signed)
Valium called into [facility] - [place], [place]

## 2017-08-13 ENCOUNTER — Ambulatory Visit: Payer: Medicare Other | Admitting: Internal Medicine

## 2017-08-16 ENCOUNTER — Other Ambulatory Visit: Payer: Self-pay | Admitting: Family Medicine

## 2017-08-22 ENCOUNTER — Other Ambulatory Visit: Payer: Self-pay | Admitting: Family Medicine

## 2017-08-22 MED ORDER — DIOVAN 40 MG PO TABS: 120.0000 mg | ORAL_TABLET | Freq: Every day | ORAL | 1 refills | Status: DC

## 2017-08-22 NOTE — Telephone Encounter (Signed)
Pt last seen 02/2017 for BP ck. Refilled per protocol and pt voiced understanding.

## 2017-08-22 NOTE — Telephone Encounter (Signed)
Copied from [place] [id number]. Topic: Quick Communication - Rx Refill/Question >> Aug 22, 2017  1:01 PM Arletha Grippe wrote: Has the patient contacted their pharmacy? Yes.     (Agent: If no, request that the patient contact the pharmacy for the refill.)   Preferred Pharmacy (with phone number or street name): [facility] in [place].  Pt needs 90 day supply of DIOVAN 40 MG tablet cb # [phone]   Agent: Please be advised that RX refills may take up to 3 business days. We ask that you follow-up with your pharmacy.

## 2017-08-24 ENCOUNTER — Other Ambulatory Visit: Payer: Self-pay | Admitting: Family Medicine

## 2017-08-24 NOTE — Telephone Encounter (Signed)
Copied from [place] [id number]. Topic: Quick Communication - See Telephone Encounter >> Aug 24, 2017 11:50 AM Ether Griffins B wrote: CRM for notification. See Telephone encounter for:  Pt needing refill on Norvasc and Pantoprazole. Pharmacy told pt to call office to expedite process   08/24/17.

## 2017-08-24 NOTE — Telephone Encounter (Signed)
Refill for pantoprazole sent to [facility].  There should already be refills for Norvasc at pharmacy.  That was refilled on 07/12/17.

## 2017-09-05 ENCOUNTER — Encounter: Payer: Self-pay | Admitting: Podiatry

## 2017-09-05 ENCOUNTER — Ambulatory Visit (INDEPENDENT_AMBULATORY_CARE_PROVIDER_SITE_OTHER): Payer: Medicare Other | Admitting: Podiatry

## 2017-09-05 DIAGNOSIS — M79676 Pain in unspecified toe(s): Secondary | ICD-10-CM | POA: Diagnosis not present

## 2017-09-05 DIAGNOSIS — B351 Tinea unguium: Secondary | ICD-10-CM | POA: Diagnosis not present

## 2017-09-05 NOTE — Progress Notes (Signed)
She presents today chief complaint of painful elongated toenails.  Objective: Toenails are long thick yellow dystrophic-like mycotic pulses are strongly palpable no open lesions or wounds are noted.  Assessment: Pain in limb secondary to onychomycosis 1 through 5 bilateral.  Plan: Debridement of toenails 1 through 5 bilateral.

## 2017-09-07 ENCOUNTER — Ambulatory Visit: Payer: Medicare Other | Admitting: Family Medicine

## 2017-09-13 ENCOUNTER — Other Ambulatory Visit: Payer: Medicare Other

## 2017-09-13 ENCOUNTER — Encounter
Admission: RE | Admit: 2017-09-13 | Discharge: 2017-09-13 | Disposition: A | Discharge status: Home / Self Care | Payer: Medicare Other | Source: Ambulatory Visit | Attending: Cardiovascular Disease | Admitting: Cardiovascular Disease

## 2017-09-13 DIAGNOSIS — R0602 Shortness of breath: Secondary | ICD-10-CM

## 2017-09-13 LAB — NM MYOCAR MULTI W/SPECT W/WALL MOTION / EF
CHL CUP NUCLEAR SRS: 0
CSEPED: 0 min
Estimated workload: 1 METS
Exercise duration (sec): 0 s
LVDIAVOL: 52 mL (ref 46–106)
LVSYSVOL: 20 mL
MPHR: 142 {beats}/min
Peak HR: 112 {beats}/min
Percent HR: 78 %
Rest HR: 80 {beats}/min
SDS: 0
SSS: 2
TID: 1.11

## 2017-09-13 MED ORDER — TECHNETIUM TC 99M TETROFOSMIN IV KIT
32.0150 | PACK | Freq: Once | INTRAVENOUS | Status: AC | PRN
  Administered 2017-09-13: 32.015 via INTRAVENOUS

## 2017-09-13 MED ORDER — REGADENOSON 0.4 MG/5ML IV SOLN
0.4000 mg | Freq: Once | INTRAVENOUS | Status: AC
  Administered 2017-09-13: 0.4 mg via INTRAVENOUS

## 2017-09-13 MED ORDER — TECHNETIUM TC 99M TETROFOSMIN IV KIT
10.0000 | PACK | Freq: Once | INTRAVENOUS | Status: AC | PRN
  Administered 2017-09-13: 13.32 via INTRAVENOUS

## 2017-09-18 ENCOUNTER — Ambulatory Visit (INDEPENDENT_AMBULATORY_CARE_PROVIDER_SITE_OTHER): Payer: Medicare Other | Admitting: Family Medicine

## 2017-09-18 ENCOUNTER — Encounter: Payer: Self-pay | Admitting: Family Medicine

## 2017-09-18 ENCOUNTER — Other Ambulatory Visit: Payer: Self-pay

## 2017-09-18 VITALS — BP 150/68 | HR 73 | Temp 97.4°F | Ht 64.0 in | Wt 165.5 lb

## 2017-09-18 DIAGNOSIS — I10 Essential (primary) hypertension: Secondary | ICD-10-CM

## 2017-09-18 DIAGNOSIS — E7800 Pure hypercholesterolemia, unspecified: Secondary | ICD-10-CM

## 2017-09-18 DIAGNOSIS — I5189 Other ill-defined heart diseases: Secondary | ICD-10-CM

## 2017-09-18 NOTE — Assessment & Plan Note (Signed)
Encouraged a trial of zetia for cholesterol control.

## 2017-09-18 NOTE — Patient Instructions (Addendum)
Encouraged trial of zetia as recommended by cardiology.  Continue HCTZ.  Consider use cane for stability.

## 2017-09-18 NOTE — Assessment & Plan Note (Signed)
Good control at home.

## 2017-09-18 NOTE — Progress Notes (Signed)
Subjective:    Patient ID: [name] [name], female    DOB: 12-13-1940, 77 y.o.   MRN: [id number]  HPI   77 year old female presents for follow up.  Reviewed D.r Rockey Situ, cardiology OV note from 08/09/2017  Recs were as follows: Bilateral carotid artery stenosis  60-70% disease on the right Stressed importance of aggressive lipid management  Mixed hyperlipidemia -  Suggested she add Zetia 10 mg daily to reach goal LDL less than 70 Statin intolerance If not close to goal, she may benefit from repatha or praluent  Shortness of breath - Plan: NM Myocar Multi W/Spect W/Wall Motion / EF Etiology unclear, given significant peripheral arterial disease there is concern for coronary ischemia.  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  We have recommended pharmacologic Myoview  Lower extremity edema Uncertain if she has fluid retention and diastolic CHF or complication from amlodipine She has high fluid intake daily, lots of water Recommend she try to moderate her fluid intake (which may be contributing to low sodium) And suggest she start HCTZ daily with potassium 10 mEq  Gait instability  Tremendous gait instability moving around the room, suspect this could be contributing to her "dizziness feeling".  Blood pressure stable, less likely orthostasis or arrhythmia  Today she reports:    She has noted some improved SOB and decreased swelling with HCTZ addition.  Myoview was low risk.  She has refused zetia .Marland Kitchen Has not tried yet.. She is hesitant given SE  to statin. She is on red yeast rice. She has also had some improvement in dizziness.   Blood pressure (!) 150/68, pulse 73, temperature (!) 97.4 F (36.3 C), temperature source Oral, height 5\' 4"  (1.626 m), weight 165 lb 8 oz (75.1 kg), SpO2 100 %.  Review of Systems  Constitutional: Negative for fatigue and fever.  HENT: Negative for ear pain.   Eyes: Negative for pain.  Respiratory: Negative for chest tightness and shortness  of breath.   Cardiovascular: Negative for chest pain, palpitations and leg swelling.  Gastrointestinal: Negative for abdominal pain.  Genitourinary: Negative for dysuria.       Objective:   Physical Exam  Constitutional: Vital signs are normal. She appears well-developed and well-nourished. She is cooperative.  Non-toxic appearance. She does not appear ill. No distress.  HENT:  Head: Normocephalic.  Right Ear: Hearing, tympanic membrane, external ear and ear canal normal. Tympanic membrane is not erythematous, not retracted and not bulging.  Left Ear: Hearing, tympanic membrane, external ear and ear canal normal. Tympanic membrane is not erythematous, not retracted and not bulging.  Nose: No mucosal edema or rhinorrhea. Right sinus exhibits no maxillary sinus tenderness and no frontal sinus tenderness. Left sinus exhibits no maxillary sinus tenderness and no frontal sinus tenderness.  Mouth/Throat: Uvula is midline, oropharynx is clear and moist and mucous membranes are normal.  Eyes: Conjunctivae, EOM and lids are normal. Pupils are equal, round, and reactive to light. Lids are everted and swept, no foreign bodies found.  Neck: Trachea normal and normal range of motion. Neck supple. Carotid bruit is not present. No thyroid mass and no thyromegaly present.  Cardiovascular: Normal rate, regular rhythm, S1 normal, S2 normal, normal heart sounds, intact distal pulses and normal pulses. Exam reveals no gallop and no friction rub.  No murmur heard.  bilaterally 1 plus pitting edema  Pulmonary/Chest: Effort normal and breath sounds normal. No tachypnea. No respiratory distress. She has no decreased breath sounds. She has no wheezes.  She has no rhonchi. She has no rales.  Abdominal: Soft. Normal appearance and bowel sounds are normal. There is no tenderness.  Neurological: She is alert.  Skin: Skin is warm, dry and intact. No rash noted.  Psychiatric: Her speech is normal and behavior is normal.  Judgment and thought content normal. Her mood appears not anxious. Cognition and memory are normal. She does not exhibit a depressed mood.          Assessment & Plan:

## 2017-09-18 NOTE — Assessment & Plan Note (Signed)
Low risk myoview.   Symptoms improved with HCTZ and decreased in fluid overload.

## 2017-09-20 ENCOUNTER — Other Ambulatory Visit: Payer: Self-pay | Admitting: Family Medicine

## 2017-09-24 ENCOUNTER — Telehealth: Payer: Self-pay | Admitting: Cardiovascular Disease

## 2017-09-24 DIAGNOSIS — R42 Dizziness and giddiness: Secondary | ICD-10-CM

## 2017-09-24 DIAGNOSIS — R0602 Shortness of breath: Secondary | ICD-10-CM

## 2017-09-24 NOTE — Telephone Encounter (Signed)
Spoke with patient and she states that Dr. Rockey Situ mentioned a "butterfly" monitor she could get. Advised that I would check with Dr. Rockey Situ and once reviewed I would enter order and have scheduling call her to get this set up. She verbalized understanding and had no further questions at this time.

## 2017-09-24 NOTE — Telephone Encounter (Signed)
Ok to order a zio monitor for dizziness, shortness of breath episodes

## 2017-09-24 NOTE — Telephone Encounter (Signed)
Patient is calling in regards to butterfly patch heart monitor Dr Rockey Situ suggested she get  If there is a particular time she can come in for monitor it is ok to leave message on vm Please call to discuss

## 2017-09-25 NOTE — Telephone Encounter (Signed)
Spoke with patient and reviewed that order has been placed for monitor and that I would have someone from scheduling give her a call to assist with getting her scheduled to come in for placement. She verbalized understanding and was appreciative for the call back with update.

## 2017-09-26 NOTE — Telephone Encounter (Signed)
1/28 at 10 scheduled

## 2017-10-01 ENCOUNTER — Telehealth: Payer: Self-pay | Admitting: Cardiovascular Disease

## 2017-10-01 ENCOUNTER — Other Ambulatory Visit: Payer: Self-pay | Admitting: Family Medicine

## 2017-10-01 ENCOUNTER — Ambulatory Visit (INDEPENDENT_AMBULATORY_CARE_PROVIDER_SITE_OTHER): Payer: Medicare Other

## 2017-10-01 DIAGNOSIS — R42 Dizziness and giddiness: Secondary | ICD-10-CM | POA: Diagnosis not present

## 2017-10-01 DIAGNOSIS — R0602 Shortness of breath: Secondary | ICD-10-CM

## 2017-10-01 NOTE — Telephone Encounter (Signed)
Zio monitor [id number] placed on patient. Education given. Registration confirmed.

## 2017-10-02 NOTE — Telephone Encounter (Signed)
Last office visit 09/18/2017.  Last refilled 08/10/217 for #60 with no refills.  Ok to refill?

## 2017-10-16 ENCOUNTER — Ambulatory Visit: Payer: Medicare Other | Admitting: Family Medicine

## 2017-10-23 ENCOUNTER — Telehealth: Payer: Self-pay | Admitting: Cardiovascular Disease

## 2017-10-23 NOTE — Telephone Encounter (Signed)
Pt calling stating she is going to see dentist next week for a cleaning  Was advised to call us and see if she needs to be on any medication prior or needs to be off any before   It is scheduled for 10/29/17  With Dr Daily (phone: [phone])  Please call patient and let her know.

## 2017-10-23 NOTE — Telephone Encounter (Signed)
Received call back from dentist.   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   They wanted to verify patient does not need SBE abx prior to cleaning.   Advised would send to Dr. Rockey Situ to verify abx not needed.   Chart review: Echo 10/22 evidence on mitral regurgitation OV 12/6 with Dr. Rockey Situ, no mention of MVP or history of valve replacement

## 2017-10-23 NOTE — Telephone Encounter (Signed)
No antibiotics needed.

## 2017-10-23 NOTE — Telephone Encounter (Signed)
Attempt to call Dr. Daily office to clarify what type of procedure is scheduled.     Left message to call back

## 2017-10-24 NOTE — Telephone Encounter (Signed)
Spoke with Diane at Dr. Providence Crosby office and reviewed that patient does not need any antibiotics. She confirmed information with no further questions at this time.

## 2017-10-24 NOTE — Telephone Encounter (Signed)
Spoke with patient and advised that she does not need antibiotics at this time. She verbalized understanding with no further questions at this time.

## 2017-11-01 NOTE — Progress Notes (Signed)
Cardiology Office Note  Date:  11/02/2017   ID:  [name] [name], DOB [birth year], MRN [id number]  PCP:  Jinny Sanders, MD   Chief Complaint  Patient presents with  . other    F/u zio monitor discuss pulmonary artery pressure/systolic and balance issue. Pt has not started Zetia. Meds reviewed verbally with pt.    HPI:  [name] [name] is a 77 year old woman with past medical history of Peripheral arterial disease Hyperlipidemia Carotid disease 60% to 70% on the right Hypertension Episodes of dizziness Shortness of breath, leg swelling Who presents for  mitral valve disorder, dizziness  Still having some stumbling, balance issue,  Down 7 pounds Changed her diet Not exercising  Goes to the [facility], not this winter Worse after bad night sleep not Vertigo Reports balance is poor, legs weak  No  lower extremity edema Does not take HCTZ  She has questions about some of the verbiage on her echocardiogram Concerned about mild MR Concerned about right heart pressures 39 mm hg Long discussion about each finding  EKG personally reviewed by myself on todays visit Shows normal sinus rhythm rate 75 bpm no significant ST or T wave changes   Stress test 09/2017 reviewed with her on today's visit  There was no ST segment deviation noted during stress.  The study is normal.  This is a low risk study.  The left ventricular ejection fraction is hyperdynamic (>65%).     Event monitor reviewed with her on today's visit  Predominant underlying rhythm was Sinus Rhythm. 16 Supraventricular Tachycardia runs occurred, the longest lasting 13.4 secs with an avg rate of 110 bpm.  Some episodes of Supraventricular Tachycardia may be possible Atrial Tachycardia with variable block.  Isolated SVEs were rare (<1.0%), SVE Couplets were rare (<1.0%), and SVE Triplets were rare (<1.0%). Isolated VEs were rare (<1.0%), and no VE Couplets or VE Triplets were present. Having rare episodes of atrial  tachycardia , longest 13 seconds, to 110 bpm  sporadically, rarely 16 times in 2 weeks and may not be associated with symptoms  EKG personally reviewed by myself on todays visit Shows normal sinus rhythm weight and 75 bpm no significant ST or T wave changes  echocardiogram results reviewed with her on today's visit Left ventricle: The cavity size was normal. Systolic function was   normal. The estimated ejection fraction was in the range of 60%   to 65%. Wall motion was normal; there were no regional wall   motion abnormalities. Doppler parameters are consistent with   abnormal left ventricular relaxation (grade 1 diastolic   dysfunction). - Mitral valve: There was mild regurgitation. - Left atrium: The atrium was normal in size. - Right ventricle: Systolic function was normal. - Pulmonary arteries: Systolic pressure was midlly elevated. PA   peak pressure: 39 mm Hg (S).   PMH:   has a past medical history of Arthritis, Chronic lower back pain, Diverticulitis, GERD (gastroesophageal reflux disease), Hemorrhoids, internal, with bleeding, Hiatal hernia, History of colon polyps, HLD (hyperlipidemia), Hypertension, OSA (obstructive sleep apnea), PUD (peptic ulcer disease), Scoliosis, Thyroid disease, Thyroid nodule, Vaginitis, atrophic, and Vertigo.  PSH:    Past Surgical History:  Procedure Laterality Date  . ANKLE FUSION  1967   LEFT  . CHOLECYSTECTOMY     2004  . COLONOSCOPY  06/22/2011   diverticulosis (no adenomas since 2001)  . FRACTURE SURGERY Left 1967   foot and ankle surgery    Current Outpatient Medications  Medication Sig Dispense Refill  .  aspirin 81 MG tablet Take 81 mg by mouth daily.      Marland Kitchen azelastine (ASTELIN) 0.1 % nasal spray U 2 SPRAYS IEN Q 12 H.  5  . CELEBREX 200 MG capsule TAKE 1 CAPSULE(200 MG) BY MOUTH TWICE DAILY 60 capsule 3  . diclofenac sodium (VOLTAREN) 1 % GEL Apply 4 g topically 4 (four) times daily. 100 g 1  . DIOVAN 40 MG tablet Take 3 tablets  (120 mg total) by mouth daily. 270 tablet 1  . guaiFENesin-codeine (ROBITUSSIN AC) 100-10 MG/5ML syrup Take 5-10 mLs by mouth at bedtime as needed for cough. 180 mL 0  . hydrochlorothiazide (HYDRODIURIL) 25 MG tablet TAKE 1 TABLET(25 MG) BY MOUTH DAILY AS NEEDED 90 tablet 1  . loratadine (CLARITIN) 10 MG tablet Take 10 mg by mouth daily.    . NORVASC 10 MG tablet TAKE 1 TABLET(10 MG) BY MOUTH DAILY 90 tablet 1  . Olopatadine HCl (PATADAY) 0.2 % SOLN Place 1 drop into both eyes daily. 2.5 mL 5  . pantoprazole (PROTONIX) 40 MG tablet TAKE 1 TABLET BY MOUTH TWICE DAILY 180 tablet 1  . Red Yeast Rice 600 MG CAPS Take 4 capsules by mouth daily.     Marland Kitchen VALIUM 2 MG tablet TAKE 1/2 TO 1 TABLET BY MOUTH EVERY 12 HOURS 60 tablet 0  . ezetimibe (ZETIA) 10 MG tablet Take 1 tablet (10 mg total) by mouth daily. (Patient not taking: Reported on 09/18/2017) 90 tablet 3   No current facility-administered medications for this visit.      Allergies:   Ace inhibitors; Neosporin [neomycin-bacitracin zn-polymyx]; Statins; Tramadol; and Beta adrenergic blockers   Social History:  The patient  reports that  has never smoked. she has never used smokeless tobacco. She reports that she drinks alcohol. She reports that she does not use drugs.   Family History:   family history includes Cancer in her mother, sister, and sister; Colon cancer (age of onset: 73) in her mother; Deep vein thrombosis in her father; Diabetes in her brother and sister; Heart attack in her brother and father; Heart disease in her brother, brother, brother, brother, father, mother, and sister; Hyperlipidemia in her brother and sister; Hypertension in her brother, mother, and sister; Stomach cancer in her sister.    Review of Systems: Review of Systems  Constitutional: Negative.        Balance issues  Respiratory: Negative.   Cardiovascular: Negative.   Gastrointestinal: Negative.   Musculoskeletal: Negative.   Neurological: Negative.    Psychiatric/Behavioral: Negative.   All other systems reviewed and are negative.    PHYSICAL EXAM: VS:  BP (!) 142/80 (BP Location: Left Arm, Patient Position: Sitting, Cuff Size: Normal)   Pulse 75   Ht 5\' 4"  (1.626 m)   Wt 158 lb (71.7 kg)   BMI 27.12 kg/m  , BMI Body mass index is 27.12 kg/m.  Difficulty getting down from the exam table, wobbly  Constitutional:  oriented to person, place, and time. No distress.  HENT:  Head: Normocephalic and atraumatic.  Eyes:  no discharge. No scleral icterus.  Neck: Normal range of motion. Neck supple. No JVD present.  Cardiovascular: Normal rate, regular rhythm, normal heart sounds and intact distal pulses. Exam reveals no gallop and no friction rub. No edema No murmur heard. Pulmonary/Chest: Effort normal and breath sounds normal. No stridor. No respiratory distress.  no wheezes.  no rales.  no tenderness.  Abdominal: Soft.  no distension.  no tenderness.  Musculoskeletal:  Normal range of motion.  no  tenderness or deformity.  Neurological:  normal muscle tone. Coordination normal. No atrophy Skin: Skin is warm and dry. No rash noted. not diaphoretic.  Psychiatric:  normal mood and affect. behavior is normal. Thought content normal.    Peripheral arterial disease recent Labs: 12/26/2016: ALT 19; TSH 1.22 02/05/2017: Hemoglobin 14.9; Platelets 280 02/08/2017: BUN 25; Creatinine, Ser 0.70; Potassium 3.9; Sodium 133    Lipid Panel Lab Results  Component Value Date   CHOL 184 12/26/2016   HDL 69.20 12/26/2016   LDLCALC 97 12/26/2016   TRIG 92.0 12/26/2016      Wt Readings from Last 3 Encounters:  11/02/17 158 lb (71.7 kg)  09/18/17 165 lb 8 oz (75.1 kg)  08/09/17 161 lb (73 kg)       ASSESSMENT AND PLAN:  Bilateral carotid artery stenosis - Plan: EKG 12-Lead 60-70% disease on the right Stressed importance of aggressive lipid management She is not want pills, has not started Zetia given to her on her last clinic visit Does  not want a statin Prefers to treat this with weight loss  Mixed hyperlipidemia - Plan: EKG 12-Lead Has not started Zetia 10 mg daily  goal LDL less than 70 Statin intolerance  Shortness of breath - Plan: NM Myocar Multi W/Spect W/Wall Motion / EF Likely secondary to deconditioning Recommended regular walking program  Lower extremity edema She is taking HCTZ daily Recommend she moderate her fluid intake,  Compression hose  also eats out a lot  Gait instability  Inactivity, sedentary, needs to work on her conditioning, more walking   Total encounter time more than 45 minutes  Greater than 50% was spent in counseling and coordination of care with the patient  Follow-up one year as needed   Orders Placed This Encounter  Procedures  . EKG 12-Lead     Signed, Esmond Plants, M.D., Ph.D. 11/02/2017  [place], [place]

## 2017-11-02 ENCOUNTER — Encounter: Payer: Self-pay | Admitting: Cardiovascular Disease

## 2017-11-02 ENCOUNTER — Ambulatory Visit (INDEPENDENT_AMBULATORY_CARE_PROVIDER_SITE_OTHER): Payer: Medicare Other | Admitting: Cardiovascular Disease

## 2017-11-02 VITALS — BP 142/80 | HR 75 | Ht 64.0 in | Wt 158.0 lb

## 2017-11-02 DIAGNOSIS — R0602 Shortness of breath: Secondary | ICD-10-CM | POA: Diagnosis not present

## 2017-11-02 DIAGNOSIS — R600 Localized edema: Secondary | ICD-10-CM | POA: Diagnosis not present

## 2017-11-02 DIAGNOSIS — E782 Mixed hyperlipidemia: Secondary | ICD-10-CM | POA: Diagnosis not present

## 2017-11-02 DIAGNOSIS — I739 Peripheral vascular disease, unspecified: Secondary | ICD-10-CM | POA: Diagnosis not present

## 2017-11-02 DIAGNOSIS — I10 Essential (primary) hypertension: Secondary | ICD-10-CM

## 2017-11-02 DIAGNOSIS — I6523 Occlusion and stenosis of bilateral carotid arteries: Secondary | ICD-10-CM

## 2017-11-02 MED ORDER — HYDROCHLOROTHIAZIDE 25 MG PO TABS: 25.0000 mg | ORAL_TABLET | Freq: Every day | ORAL | 2 refills | Status: DC

## 2017-11-02 NOTE — Patient Instructions (Addendum)

## 2017-11-12 ENCOUNTER — Ambulatory Visit: Payer: Medicare Other | Admitting: Podiatry

## 2017-11-12 ENCOUNTER — Ambulatory Visit (INDEPENDENT_AMBULATORY_CARE_PROVIDER_SITE_OTHER): Payer: Medicare Other | Admitting: Podiatry

## 2017-11-12 ENCOUNTER — Encounter: Payer: Self-pay | Admitting: Podiatry

## 2017-11-12 DIAGNOSIS — B351 Tinea unguium: Secondary | ICD-10-CM

## 2017-11-12 DIAGNOSIS — M79676 Pain in unspecified toe(s): Secondary | ICD-10-CM
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
## 2017-11-15 ENCOUNTER — Ambulatory Visit: Payer: Medicare Other | Admitting: Podiatry

## 2017-12-11 ENCOUNTER — Telehealth: Payer: Self-pay | Admitting: Podiatry

## 2017-12-11 NOTE — Telephone Encounter (Signed)
Could you look to see if we have a copy of that order somewhere?  She just needs a rx for a lift to be added to a shoe left or right, I don't recall.  Nor do I recall how tall.....see what you can find.

## 2017-12-11 NOTE — Telephone Encounter (Signed)
Patient called said that Dr. Milinda Pointer normally writes an RX for her shoes to be built up from a deficiency that she has with her feet. She said that he would know what she is talking about.

## 2017-12-13 ENCOUNTER — Telehealth: Payer: Self-pay | Admitting: Family Medicine

## 2017-12-13 DIAGNOSIS — E7800 Pure hypercholesterolemia, unspecified: Secondary | ICD-10-CM

## 2017-12-13 DIAGNOSIS — E042 Nontoxic multinodular goiter: Secondary | ICD-10-CM

## 2017-12-13 NOTE — Telephone Encounter (Signed)
-----   Message from Lendon Collar, RT sent at 12/12/2017 10:30 AM EDT ----- Regarding: Lab appt 12/20/17 Please enter lab orders for Thursday 12/20/17. Thanks-Lauren

## 2017-12-17 ENCOUNTER — Other Ambulatory Visit: Payer: Medicare Other

## 2017-12-20 ENCOUNTER — Other Ambulatory Visit (INDEPENDENT_AMBULATORY_CARE_PROVIDER_SITE_OTHER): Payer: Medicare Other

## 2017-12-20 DIAGNOSIS — E7800 Pure hypercholesterolemia, unspecified: Secondary | ICD-10-CM

## 2017-12-20 DIAGNOSIS — E042 Nontoxic multinodular goiter: Secondary | ICD-10-CM | POA: Diagnosis not present

## 2017-12-20 LAB — COMPREHENSIVE METABOLIC PANEL
ALK PHOS: 88 U/L (ref 39–117)
ALT: 20 U/L (ref 0–35)
AST: 12 U/L (ref 0–37)
Albumin: 4.2 g/dL (ref 3.5–5.2)
BILIRUBIN TOTAL: 0.9 mg/dL (ref 0.2–1.2)
BUN: 27 mg/dL — AB (ref 6–23)
CO2: 27 meq/L (ref 19–32)
Calcium: 9.3 mg/dL (ref 8.4–10.5)
Chloride: 94 mEq/L — ABNORMAL LOW (ref 96–112)
Creatinine, Ser: 0.82 mg/dL (ref 0.40–1.20)
GFR: 71.6 mL/min (ref >60.00)
GLUCOSE: 82 mg/dL (ref 70–99)
Potassium: 3.6 mEq/L (ref 3.5–5.1)
SODIUM: 130 meq/L — AB (ref 135–145)
TOTAL PROTEIN: 6.8 g/dL (ref 6.0–8.3)

## 2017-12-20 LAB — T4, FREE: FREE T4: 0.79 ng/dL (ref 0.60–1.60)

## 2017-12-20 LAB — LIPID PANEL
Cholesterol: 179 mg/dL (ref 0–200)
HDL: 88.3 mg/dL (ref >39.00)
NONHDL: 90.86
Total CHOL/HDL Ratio: 2
Triglycerides: 213 mg/dL — ABNORMAL HIGH (ref 0.0–149.0)
VLDL: 42.6 mg/dL — AB (ref 0.0–40.0)

## 2017-12-20 LAB — TSH: TSH: 1.81 u[IU]/mL (ref 0.35–4.50)

## 2017-12-20 LAB — LDL CHOLESTEROL, DIRECT: LDL DIRECT: 66 mg/dL

## 2017-12-20 LAB — T3, FREE: T3 FREE: 3.7 pg/mL (ref 2.3–4.2)

## 2017-12-24 ENCOUNTER — Other Ambulatory Visit: Payer: Self-pay | Admitting: Family Medicine

## 2017-12-24 NOTE — Telephone Encounter (Signed)
Last office visit 09/18/2017.  Last refilled 10/02/2017 for #60 with no refills.  Ok to refill?

## 2017-12-25 ENCOUNTER — Other Ambulatory Visit: Payer: Medicare Other

## 2018-01-01 ENCOUNTER — Ambulatory Visit: Payer: Medicare Other

## 2018-01-08 ENCOUNTER — Other Ambulatory Visit: Payer: Self-pay

## 2018-01-08 ENCOUNTER — Encounter: Payer: Self-pay | Admitting: Family Medicine

## 2018-01-08 ENCOUNTER — Ambulatory Visit (INDEPENDENT_AMBULATORY_CARE_PROVIDER_SITE_OTHER): Payer: Medicare Other | Admitting: Family Medicine

## 2018-01-08 VITALS — BP 120/80 | HR 80 | Temp 97.4°F | Ht 62.5 in | Wt 165.5 lb

## 2018-01-08 DIAGNOSIS — F418 Other specified anxiety disorders: Secondary | ICD-10-CM

## 2018-01-08 DIAGNOSIS — H8109 Meniere's disease, unspecified ear: Secondary | ICD-10-CM | POA: Diagnosis not present

## 2018-01-08 DIAGNOSIS — F5104 Psychophysiologic insomnia: Secondary | ICD-10-CM

## 2018-01-08 DIAGNOSIS — I5189 Other ill-defined heart diseases: Secondary | ICD-10-CM | POA: Diagnosis not present

## 2018-01-08 DIAGNOSIS — I6523 Occlusion and stenosis of bilateral carotid arteries: Secondary | ICD-10-CM

## 2018-01-08 DIAGNOSIS — E7800 Pure hypercholesterolemia, unspecified: Secondary | ICD-10-CM | POA: Diagnosis not present

## 2018-01-08 DIAGNOSIS — Z1211 Encounter for screening for malignant neoplasm of colon: Secondary | ICD-10-CM | POA: Diagnosis not present

## 2018-01-08 DIAGNOSIS — I739 Peripheral vascular disease, unspecified: Secondary | ICD-10-CM

## 2018-01-08 DIAGNOSIS — I10 Essential (primary) hypertension: Secondary | ICD-10-CM

## 2018-01-08 DIAGNOSIS — I6521 Occlusion and stenosis of right carotid artery: Secondary | ICD-10-CM

## 2018-01-08 DIAGNOSIS — E871 Hypo-osmolality and hyponatremia: Secondary | ICD-10-CM | POA: Insufficient documentation

## 2018-01-08 DIAGNOSIS — E042 Nontoxic multinodular goiter: Secondary | ICD-10-CM | POA: Diagnosis not present

## 2018-01-08 LAB — BASIC METABOLIC PANEL
BUN: 22 mg/dL (ref 6–23)
CO2: 25 mEq/L (ref 19–32)
CREATININE: 0.73 mg/dL (ref 0.40–1.20)
Calcium: 10 mg/dL (ref 8.4–10.5)
Chloride: 92 mEq/L — ABNORMAL LOW (ref 96–112)
GFR: 81.87 mL/min (ref >60.00)
Glucose, Bld: 93 mg/dL (ref 70–99)
Potassium: 4.1 mEq/L (ref 3.5–5.1)
Sodium: 129 mEq/L — ABNORMAL LOW (ref 135–145)

## 2018-01-08 NOTE — Assessment & Plan Note (Signed)
Asymptomatic. 

## 2018-01-08 NOTE — Patient Instructions (Addendum)
Please stop at the lab to have labs drawn. Plan repeat doppler in 04/2018  Return for prevnar.  Please stop at the front desk to set up referral.

## 2018-01-08 NOTE — Assessment & Plan Note (Signed)
Well controlled. Continue current medication.  Work on lower trigs.

## 2018-01-08 NOTE — Assessment & Plan Note (Signed)
Stable on valium low dose.. Unable to wean off given muscle cramp and OA keeps her from sleeping.  Denies anxiety, depression.

## 2018-01-08 NOTE — Assessment & Plan Note (Signed)
Well controlled. Continue current medication. Encouraged exercise, weight loss, healthy eating habits.  
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
## 2018-01-08 NOTE — Assessment & Plan Note (Signed)
Followed by ENT 

## 2018-01-09 ENCOUNTER — Telehealth: Payer: Self-pay | Admitting: Family Medicine

## 2018-01-09 NOTE — Telephone Encounter (Signed)
I don't see any lab results yet.  Will check again tomorrow.

## 2018-01-09 NOTE — Telephone Encounter (Signed)
Copied from [place] [id number]. Topic: Quick Communication - See Telephone Encounter >> Jan 09, 2018  4:08 PM Aurelio Brash B wrote: CRM for notification. See Telephone encounter for: 01/09/18. Pt is requesting lab results

## 2018-01-10 NOTE — Telephone Encounter (Signed)
Lab results discussed with Mrs. Kim.  See result note from labs done on 01/08/2018.

## 2018-01-10 NOTE — Telephone Encounter (Signed)
Left message for Mrs. Vanderpool to call back in regards to lab results.

## 2018-01-14 ENCOUNTER — Telehealth: Payer: Self-pay

## 2018-01-14 NOTE — Telephone Encounter (Signed)
Dr Diona Browner, Dr Collene Mares declined to see patient due to the insurance (not taking New Medicare patients). Do you have another doctor you would like her to see?Marland KitchenKris Mouton, RMA   Copied from [place] [phone]. Topic: Referral - Status >> Jan 14, 2018 10:39 AM Synthia Innocent wrote: Reason for CRM: Dr Collene Mares declined to see patient

## 2018-01-15 NOTE — Telephone Encounter (Signed)
[facility] or ask pt.

## 2018-01-15 NOTE — Telephone Encounter (Signed)
Dr Collene Mares declined referral. Spoke with patient, she is out of town till 01/21/18 and then will do some research and let me know who she wants to go to. Advised patient I will follow up with her in June if I have not heard from her back by then-AH

## 2018-01-23 ENCOUNTER — Ambulatory Visit: Payer: Medicare Other | Admitting: Podiatry

## 2018-02-08 ENCOUNTER — Other Ambulatory Visit (INDEPENDENT_AMBULATORY_CARE_PROVIDER_SITE_OTHER): Payer: Medicare Other

## 2018-02-08 DIAGNOSIS — I10 Essential (primary) hypertension: Secondary | ICD-10-CM | POA: Diagnosis not present

## 2018-02-08 LAB — BASIC METABOLIC PANEL
BUN: 29 mg/dL — AB (ref 6–23)
CHLORIDE: 99 meq/L (ref 96–112)
CO2: 26 meq/L (ref 19–32)
Calcium: 9.8 mg/dL (ref 8.4–10.5)
Creatinine, Ser: 0.79 mg/dL (ref 0.40–1.20)
GFR: 74.72 mL/min (ref >60.00)
GLUCOSE: 92 mg/dL (ref 70–99)
POTASSIUM: 3.9 meq/L (ref 3.5–5.1)
Sodium: 136 mEq/L (ref 135–145)

## 2018-02-18 ENCOUNTER — Ambulatory Visit (INDEPENDENT_AMBULATORY_CARE_PROVIDER_SITE_OTHER): Payer: Medicare Other | Admitting: Family Medicine

## 2018-02-18 ENCOUNTER — Other Ambulatory Visit: Payer: Self-pay | Admitting: Family Medicine

## 2018-02-18 ENCOUNTER — Encounter: Payer: Self-pay | Admitting: Family Medicine

## 2018-02-18 ENCOUNTER — Telehealth: Payer: Self-pay | Admitting: Cardiovascular Disease

## 2018-02-18 VITALS — BP 162/82 | HR 100 | Temp 98.1°F | Ht 62.5 in | Wt 162.2 lb

## 2018-02-18 DIAGNOSIS — H8109 Meniere's disease, unspecified ear: Secondary | ICD-10-CM | POA: Diagnosis not present

## 2018-02-18 DIAGNOSIS — I5189 Other ill-defined heart diseases: Secondary | ICD-10-CM

## 2018-02-18 DIAGNOSIS — E871 Hypo-osmolality and hyponatremia: Secondary | ICD-10-CM

## 2018-02-18 DIAGNOSIS — I10 Essential (primary) hypertension: Secondary | ICD-10-CM

## 2018-02-18 DIAGNOSIS — R0602 Shortness of breath: Secondary | ICD-10-CM | POA: Diagnosis not present

## 2018-02-18 DIAGNOSIS — I6523 Occlusion and stenosis of bilateral carotid arteries: Secondary | ICD-10-CM

## 2018-02-18 DIAGNOSIS — R600 Localized edema: Secondary | ICD-10-CM | POA: Diagnosis not present

## 2018-02-18 LAB — POC URINALSYSI DIPSTICK (AUTOMATED)
Bilirubin, UA: NEGATIVE
Glucose, UA: NEGATIVE
Ketones, UA: NEGATIVE
Leukocytes, UA: NEGATIVE
NITRITE UA: NEGATIVE
PH UA: 6 (ref 5.0–8.0)
PROTEIN UA: NEGATIVE
RBC UA: NEGATIVE
SPEC GRAV UA: 1.025 (ref 1.010–1.025)
UROBILINOGEN UA: 0.2 U/dL

## 2018-02-18 MED ORDER — FUROSEMIDE 20 MG PO TABS: 10.0000 mg | ORAL_TABLET | Freq: Every day | ORAL | 0 refills | Status: DC

## 2018-02-18 NOTE — Telephone Encounter (Signed)
Pt calling stating she's been going to physical therapy and they noticed she's been retaining fluid, along with SOB.  She notices the SOB at night when she lays down more.   It is In both legs She was advised to call us  She noticed this on Tuesday  She increased her sodium for it was low but not she not able to balance the retention Dr Josefa Half is the one who asked her to have more sodium, she called her office and she is off this week.   She states she is taking her fluid pills  But is not sure what else to do.   Please advise

## 2018-02-18 NOTE — Telephone Encounter (Signed)
Spoke with patient and she reports some shortness of breath and ankle swelling. Reviewed importance of low sodium diet and limiting fluid intake. Also discussed compression hose and leg elevation during the day. She is unwilling to try the compression socks stating that it just does not work for her. She was told to call if any problems and that Dr. Rockey Situ may be able to add some medications without having to come in. Advised that I would send this to him and would call her back with any of his recommendations. Reviewed signs and symptoms she should monitor for that would require immediate evaluation in the ED. She verbalized understanding with no further questions at this time.

## 2018-02-18 NOTE — Assessment & Plan Note (Addendum)
On HCTZ per ENT - may need to consider taper or discontinuation in hyponatremia history.

## 2018-02-18 NOTE — Assessment & Plan Note (Signed)
Deteriorated, with hypertensive urgency today given markedly elevated readings despite compliance with medications. Will start lasix 10mg  daily x 3 days for stronger diuresis, update [place] with effect.

## 2018-02-18 NOTE — Assessment & Plan Note (Signed)
H/o this - start lasix 10mg  daily x 3 days, call us with effect. Low threshold to titrate dose to effect.

## 2018-02-18 NOTE — Assessment & Plan Note (Signed)
Need to balance salt restriction with hctz use and with fluid intake. I anticipate recent hyponatremia due to thiazide use - see section on Meniere's.

## 2018-02-18 NOTE — Assessment & Plan Note (Signed)
Acutely worse with hypertensive urgency in setting of recent increased sodium intake. Advised to again limit sodium, will Rx lasix 10mg  daily x 3 days and call us on Wednesday with update.

## 2018-02-18 NOTE — Progress Notes (Signed)
BP (!) 162/82 (BP Location: Right Arm, Patient Position: Sitting, Cuff Size: Normal)   Pulse 100   Temp 98.1 F (36.7 C) (Oral)   Ht 5' 2.5" (1.588 m)   Wt 162 lb 4 oz (73.6 kg)   SpO2 94%   BMI 29.20 kg/m   Elevated on repeat bp check  CC: leg swelling Subjective:    Patient ID: [name] [name], female    DOB: 11/07/1940, 77 y.o.   MRN: [id number]  HPI: [name] [name] is a 77 y.o. female presenting on 02/18/2018 for Leg Swelling (Told to increase sodium due to low levels. Then edema started in bilateral LEs. Also, has fluid in left ear.)   Noticing increasing swelling of legs, hands, even in abdomen - this started 1 week ago after eating chinese food. She also has had increased dyspnea this past week, orthopnea (however just sleeps on 1 pillow) and exertional dyspnea. Some orthostatic dizziness. Over the last month she has not restricted sodium in her diet after labs showed hyponatremia. Latest labs (02/08/2018) returned improved, with normal renal function).   Current BP regimen is norvasc 10mg  daily, hctz 25mg  daily, diovan 120mg  daily (3 40mg  tablets).  She takes celebrex 200mg  BID PRN (usually once daily)  Denies chest pain, tightness, cough.  Known MVP with regurgitation. Denies h/o CHF.  Known chronic venous insufficiency.  Known meniere's disease (dx 09/2017) - ENT recommended limiting sodium, limiting water. She received prednisone course as well. Also undergoing vestibular PT for this. Even saw soft tissue therapist last week for leg swelling.   Relevant past medical, surgical, family and social history reviewed and updated as indicated. Interim medical history since our last visit reviewed. Allergies and medications reviewed and updated. Outpatient Medications Prior to Visit  Medication Sig Dispense Refill  . aspirin 81 MG tablet Take 81 mg by mouth daily.      Marland Kitchen azelastine (ASTELIN) 0.1 % nasal spray U 2 SPRAYS IEN Q 12 H.  5  . CELEBREX 200 MG capsule TAKE 1 CAPSULE(200  MG) BY MOUTH TWICE DAILY 60 capsule 3  . diclofenac sodium (VOLTAREN) 1 % GEL Apply 4 g topically 4 (four) times daily. 100 g 1  . DIOVAN 40 MG tablet Take 3 tablets (120 mg total) by mouth daily. 270 tablet 1  . guaiFENesin-codeine (ROBITUSSIN AC) 100-10 MG/5ML syrup Take 5-10 mLs by mouth at bedtime as needed for cough. 180 mL 0  . hydrochlorothiazide (HYDRODIURIL) 25 MG tablet Take 1 tablet (25 mg total) by mouth daily. 90 tablet 2  . loratadine (CLARITIN) 10 MG tablet Take 10 mg by mouth daily.    . NORVASC 10 MG tablet TAKE 1 TABLET(10 MG) BY MOUTH DAILY 90 tablet 1  . Olopatadine HCl (PATADAY) 0.2 % SOLN Place 1 drop into both eyes daily. 2.5 mL 5  . pantoprazole (PROTONIX) 40 MG tablet TAKE 1 TABLET BY MOUTH TWICE DAILY 180 tablet 1  . Red Yeast Rice 600 MG CAPS Take 4 capsules by mouth daily.     Marland Kitchen VALIUM 2 MG tablet TAKE 1/2 TO 1 TABLET BY MOUTH EVERY 12 HOURS 60 tablet 0   No facility-administered medications prior to visit.      Per HPI unless specifically indicated in ROS section below Review of Systems     Objective:    BP (!) 162/82 (BP Location: Right Arm, Patient Position: Sitting, Cuff Size: Normal)   Pulse 100   Temp 98.1 F (36.7 C) (Oral)   Ht 5' 2.5" (  1.588 m)   Wt 162 lb 4 oz (73.6 kg)   SpO2 94%   BMI 29.20 kg/m   Wt Readings from Last 3 Encounters:  02/18/18 162 lb 4 oz (73.6 kg)  01/08/18 165 lb 8 oz (75.1 kg)  11/02/17 158 lb (71.7 kg)    Physical Exam  Constitutional: She appears well-developed and well-nourished. No distress.  HENT:  Mouth/Throat: Oropharynx is clear and moist. No oropharyngeal exudate.  Neck: No thyromegaly present.  Cardiovascular: Normal rate and regular rhythm.  Murmur (3/6 systolic at apex) heard. Pulmonary/Chest: Effort normal and breath sounds normal. No respiratory distress. She has no wheezes. She has no rales.  Musculoskeletal: She exhibits edema (tr-1+ pitting edema bilateral LE, tr edema of hands).  Lymphadenopathy:      She has no cervical adenopathy.  Nursing note and vitals reviewed.  Results for orders placed or performed in visit on 02/18/18  POCT Urinalysis Dipstick (Automated)  Result Value Ref Range   Color, UA yellow    Clarity, UA clear    Glucose, UA Negative Negative   Bilirubin, UA negative    Ketones, UA negative    Spec Grav, UA 1.025 1.010 - 1.025   Blood, UA negative    pH, UA 6.0 5.0 - 8.0   Protein, UA Negative Negative   Urobilinogen, UA 0.2 0.2 or 1.0 E.U./dL   Nitrite, UA negative    Leukocytes, UA Negative Negative      Assessment & Plan:   Problem List Items Addressed This Visit    Meniere's disease    On HCTZ per ENT - may need to consider taper or discontinuation in hyponatremia history.       Lower extremity edema - Primary    Acutely worse with hypertensive urgency in setting of recent increased sodium intake. Advised to again limit sodium, will Rx lasix 10mg  daily x 3 days and call us on Wednesday with update.       Relevant Orders   POCT Urinalysis Dipstick (Automated) (Completed)   Basic metabolic panel   Brain natriuretic peptide   Hyponatremia    Need to balance salt restriction with hctz use and with fluid intake. I anticipate recent hyponatremia due to thiazide use - see section on Meniere's.      Relevant Orders   Basic metabolic panel   Essential hypertension    Deteriorated, with hypertensive urgency today given markedly elevated readings despite compliance with medications. Will start lasix 10mg  daily x 3 days for stronger diuresis, update [place] with effect.       Relevant Medications   furosemide (LASIX) 20 MG tablet   Diastolic dysfunction without heart failure    H/o this - start lasix 10mg  daily x 3 days, call us with effect. Low threshold to titrate dose to effect.        Other Visit Diagnoses    Shortness of breath       Relevant Orders   Brain natriuretic peptide       Meds ordered this encounter  Medications  . furosemide  (LASIX) 20 MG tablet    Sig: Take 0.5 tablets (10 mg total) by mouth daily. For 3 days and as needed    Dispense:  20 tablet    Refill:  0   Orders Placed This Encounter  Procedures  . Basic metabolic panel  . Brain natriuretic peptide  . POCT Urinalysis Dipstick (Automated)    Follow up plan: Return if symptoms worsen or fail to improve.  Ria Bush, MD

## 2018-02-18 NOTE — Patient Instructions (Addendum)
We have to find a good balance between medicines, sodium in the diet, and symptoms.  I recommend you go back to restricting salt/sodium in the diet, no more than 2000mg /day of sodium.  Continue current medicines, add lasix (furosemide) 20mg  1/2 tablet daily for next 3 days to help get rid of some fluid.  Labs today. Urine test today (urinalysis).  Update [place] on Wednesday with effect of new medicine.

## 2018-02-19 ENCOUNTER — Telehealth: Payer: Self-pay | Admitting: Cardiovascular Disease

## 2018-02-19 ENCOUNTER — Ambulatory Visit (INDEPENDENT_AMBULATORY_CARE_PROVIDER_SITE_OTHER): Payer: Medicare Other | Admitting: Internal Medicine

## 2018-02-19 ENCOUNTER — Encounter: Payer: Self-pay | Admitting: Internal Medicine

## 2018-02-19 VITALS — BP 142/78 | HR 85 | Resp 16 | Ht 62.5 in | Wt 162.0 lb

## 2018-02-19 DIAGNOSIS — J3089 Other allergic rhinitis: Secondary | ICD-10-CM | POA: Diagnosis not present

## 2018-02-19 DIAGNOSIS — J302 Other seasonal allergic rhinitis: Secondary | ICD-10-CM | POA: Diagnosis not present

## 2018-02-19 DIAGNOSIS — I6523 Occlusion and stenosis of bilateral carotid arteries: Secondary | ICD-10-CM | POA: Diagnosis not present

## 2018-02-19 DIAGNOSIS — G4733 Obstructive sleep apnea (adult) (pediatric): Secondary | ICD-10-CM

## 2018-02-19 DIAGNOSIS — I6521 Occlusion and stenosis of right carotid artery: Secondary | ICD-10-CM

## 2018-02-19 LAB — BASIC METABOLIC PANEL
BUN: 23 mg/dL (ref 6–23)
CALCIUM: 10.2 mg/dL (ref 8.4–10.5)
CHLORIDE: 98 meq/L (ref 96–112)
CO2: 24 meq/L (ref 19–32)
CREATININE: 0.77 mg/dL (ref 0.40–1.20)
GFR: 76.96 mL/min (ref >60.00)
Glucose, Bld: 92 mg/dL (ref 70–99)
Potassium: 3.7 mEq/L (ref 3.5–5.1)
Sodium: 134 mEq/L — ABNORMAL LOW (ref 135–145)

## 2018-02-19 LAB — BRAIN NATRIURETIC PEPTIDE: Pro B Natriuretic peptide (BNP): 45 pg/mL (ref 0.0–100.0)

## 2018-02-19 NOTE — Telephone Encounter (Signed)
Patient has another encounter in your box as well. She also wants carotids. Do you want [place] to order?

## 2018-02-19 NOTE — Telephone Encounter (Signed)
Patient wants a carotid u/s .  No active order and results say fu PRN.  Please advise .

## 2018-02-19 NOTE — Telephone Encounter (Signed)
Last office visit 02/18/2018 with Dr. Darnell Level.  Last refilled 12/25/2017 for #60 with no refills.  Ok to refill?

## 2018-02-19 NOTE — Progress Notes (Signed)
* [facility]     Assessment and Plan:  OSA.  --Doing well with CPAP, should continue.    Rhinitis.  --Continue astelin  Date: 02/19/2018  MRN# [id number] [name] [name] June 19, 1941   [name] [name] is a 77 y.o. old female seen in follow up for chief complaint of  Chief Complaint  Patient presents with  . Sleep Apnea    pt states cpap therapy is doing ok.She is currently retaining fluid and states started lasix yesterday.     HPI:   The patient is a 49 female with OSA. She was recently also diagnosed with Meniere's disease and MVP.  She has been doing well with her CPAP, she no longer sleeps flat due to orthopnea.   She is taking astelin one spray in each nostril every night, she also takes claritin every day.   **Download 30 days as or 02/18/18; data personally reviewed. Usage great than 4 hours is 28/30 days. Avg usage on days used is 8 hours and 29 minutes. Median pressure is 9, 95th percentile is 12, max pressure is 14.   Review of download data for 30 days shows average usage of 8 hours 35 minutes, CPAP, his AutoSet 5-15. 95th percentile pressure was 13.6. Review graphic data shows a pressure range between 12 and 14. Residual AHI is 0.5  Medication:   Outpatient Encounter Medications as of 02/19/2018  Medication Sig  . aspirin 81 MG tablet Take 81 mg by mouth daily.    Marland Kitchen azelastine (ASTELIN) 0.1 % nasal spray U 2 SPRAYS IEN Q 12 H.  . CELEBREX 200 MG capsule TAKE 1 CAPSULE(200 MG) BY MOUTH TWICE DAILY  . diclofenac sodium (VOLTAREN) 1 % GEL Apply 4 g topically 4 (four) times daily.  Marland Kitchen DIOVAN 40 MG tablet Take 3 tablets (120 mg total) by mouth daily.  . furosemide (LASIX) 20 MG tablet Take 0.5 tablets (10 mg total) by mouth daily. For 3 days and as needed  . guaiFENesin-codeine (ROBITUSSIN AC) 100-10 MG/5ML syrup Take 5-10 mLs by mouth at bedtime as needed for cough.  . hydrochlorothiazide (HYDRODIURIL) 25 MG tablet Take 1 tablet (25 mg total) by mouth  daily.  Marland Kitchen loratadine (CLARITIN) 10 MG tablet Take 10 mg by mouth daily.  . NORVASC 10 MG tablet TAKE 1 TABLET(10 MG) BY MOUTH DAILY  . Olopatadine HCl (PATADAY) 0.2 % SOLN Place 1 drop into both eyes daily.  . pantoprazole (PROTONIX) 40 MG tablet TAKE 1 TABLET BY MOUTH TWICE DAILY  . Red Yeast Rice 600 MG CAPS Take 4 capsules by mouth daily.   Marland Kitchen VALIUM 2 MG tablet TAKE 1/2 TO 1 TABLET BY MOUTH EVERY 12 HOURS   No facility-administered encounter medications on file as of 02/19/2018.      Allergies:  Ace inhibitors; Neosporin [neomycin-bacitracin zn-polymyx]; Statins; Tramadol; and Beta adrenergic blockers  Review of Systems: Gen:  Denies  fever, sweats. HEENT: Denies blurred vision. Cvc:  No dizziness, chest pain or heaviness Resp:   Denies cough or sputum porduction. Gi: Denies swallowing difficulty, stomach pain. constipation, bowel incontinence Gu:  Denies bladder incontinence, burning urine Ext:   No Joint pain, stiffness. Skin: No skin rash, easy bruising. Endoc:  No polyuria, polydipsia. Psych: No depression, insomnia. Other:  All other systems were reviewed and found to be negative other than what is mentioned in the HPI.   Physical Examination:   VS: BP (!) 142/78 (BP Location: Left Arm, Cuff Size: Normal)   Pulse 85  Resp 16   Ht 5' 2.5" (1.588 m)   Wt 162 lb (73.5 kg)   SpO2 99%   BMI 29.16 kg/m    General Appearance: No distress  Neuro:without focal findings,  speech normal,  HEENT: PERRLA, EOM intact. Pulmonary: normal breath sounds, No wheezing.   CardiovascularNormal S1,S2.  No m/r/g.   Abdomen: Benign, Soft, non-tender. Renal:  No costovertebral tenderness  GU:  Not performed at this time. Endoc: No evident thyromegaly, no signs of acromegaly. Skin:   warm, no rash. Extremities: normal, no cyanosis, clubbing.   LABORATORY PANEL:   CBC No results for input(s): WBC, HGB, HCT, PLT in the last 168  hours. ------------------------------------------------------------------------------------------------------------------  Chemistries  Recent Labs  Lab 02/18/18 1716  NA 134*  K 3.7  CL 98  CO2 24  GLUCOSE 92  BUN 23  CREATININE 0.77  CALCIUM 10.2   ------------------------------------------------------------------------------------------------------------------  Cardiac Enzymes No results for input(s): TROPONINI in the last 168 hours. ------------------------------------------------------------  RADIOLOGY:   No results found for this or any previous visit. No results found for this or any previous visit. ------------------------------------------------------------------------------------------------------------------  Thank  you for allowing [facility] to assist in the care of your patient. Our recommendations are noted above.  Please contact us if we can be of further service.   Marda Stalker, MD.  [facility] Office Number: [phone]  Patricia Pesa, M.D.  Merton Border, M.D  02/19/2018

## 2018-02-19 NOTE — Patient Instructions (Signed)
Continue to use CPAP 

## 2018-02-20 NOTE — Telephone Encounter (Signed)
Okay to order carotid ultrasound she has disease on the right

## 2018-02-20 NOTE — Telephone Encounter (Signed)
On last clinic visit was eating out frequently, likely contributing to high salt intake and fluid retention Some leg swelling may be from venous insufficiency as well Mildly elevated right heart pressures, but not significant on echo If she's not been taking her HCTZ, would restart until symptoms improve If she has been taking HCTZ, she could add Lasix 20 g for 3 days, would take with potassium 20 with each Lasix BNP was low indicating she does not have much fluid in her but perhaps Lasix might help a little bit

## 2018-02-21 ENCOUNTER — Telehealth: Payer: Self-pay

## 2018-02-21 NOTE — Telephone Encounter (Signed)
Copied from [place] [id number]. Topic: Inquiry >> Feb 21, 2018 10:34 AM Pricilla Handler wrote: Reason for CRM: Patient called in stating that she was to call back in and give Dr. Darnell Level a progress report after her visit with him on 02/18/2018. Please call patient back at [phone].       Thank You!!!

## 2018-02-21 NOTE — Telephone Encounter (Signed)
Spoke with pt relaying Dr. G's message. Pt verbalizes understanding.  

## 2018-02-21 NOTE — Telephone Encounter (Signed)
Order for carotids entered. Patient notified and transferred to scheduler. She was appreciative.

## 2018-02-21 NOTE — Telephone Encounter (Signed)
S/w patient. She went to see Dr Danise Mina on 02/18/18 who had patient take furosemide 20 mg daily for 3 days and labs drawn. Patient has PCP f/u on 02/28/18 as well. Patient states her legs are back down to normal now. She is taking the HCTZ. She says she's trying to limit sodium and going out to eat. She will call us if any new symptoms arise or return.

## 2018-02-21 NOTE — Telephone Encounter (Signed)
Glad to ear. Continue to limit salt intake. Keep lasix on hand to use 1/2 tablet daily as needed for leg swelling or weight gain.  Will need to discuss pros/cons of hctz use (likely contributing to low sodium levels) with PCP and/or ENT next visit.  Keep appt next week with PCP.  Will cc Dr Diona Browner as Juluis Rainier.

## 2018-02-21 NOTE — Telephone Encounter (Signed)
I spoke with pt; pt last seen 02/18/18 and has been taking Lasix 10 mg daily for 3 days; swelling in legs is gone today and BP normal range today BP 119/79 P 79 wt was down 2.4 lbs today; breathing OK without problem. Pt request cb after Dr Darnell Level reviews.

## 2018-02-22 NOTE — Telephone Encounter (Signed)
Noted  

## 2018-02-25 ENCOUNTER — Ambulatory Visit (INDEPENDENT_AMBULATORY_CARE_PROVIDER_SITE_OTHER): Payer: Medicare Other | Admitting: Podiatry

## 2018-02-25 ENCOUNTER — Encounter: Payer: Self-pay | Admitting: Podiatry

## 2018-02-25 ENCOUNTER — Encounter

## 2018-02-25 DIAGNOSIS — Q828 Other specified congenital malformations of skin: Secondary | ICD-10-CM | POA: Diagnosis not present

## 2018-02-25 DIAGNOSIS — M79676 Pain in unspecified toe(s): Secondary | ICD-10-CM

## 2018-02-25 DIAGNOSIS — B351 Tinea unguium: Secondary | ICD-10-CM

## 2018-02-25 NOTE — Progress Notes (Signed)
She presents today chief complaint of painful elongated toenails 1 through 5 bilateral.  Objective: Pulses are palpable.  Toenails are long thick yellow dystrophic with mycotic reactive hyper keratomas plantar aspect of bilateral foot as well.  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  Plan: Debridement of toenails 1 through 5 bilateral debridement of all reactive hyperkeratosis.

## 2018-02-28 ENCOUNTER — Ambulatory Visit (INDEPENDENT_AMBULATORY_CARE_PROVIDER_SITE_OTHER): Payer: Medicare Other | Admitting: Family Medicine

## 2018-02-28 ENCOUNTER — Encounter: Payer: Self-pay | Admitting: Family Medicine

## 2018-02-28 DIAGNOSIS — I10 Essential (primary) hypertension: Secondary | ICD-10-CM

## 2018-02-28 DIAGNOSIS — R600 Localized edema: Secondary | ICD-10-CM

## 2018-02-28 DIAGNOSIS — E871 Hypo-osmolality and hyponatremia: Secondary | ICD-10-CM | POA: Diagnosis not present

## 2018-02-28 DIAGNOSIS — I6523 Occlusion and stenosis of bilateral carotid arteries: Secondary | ICD-10-CM

## 2018-02-28 DIAGNOSIS — I5189 Other ill-defined heart diseases: Secondary | ICD-10-CM

## 2018-02-28 NOTE — Assessment & Plan Note (Signed)
Follow BP at home continue current meds, but call if running > 140/90.  If BP spikes very high.. Go to ER.

## 2018-02-28 NOTE — Assessment & Plan Note (Signed)
Improved despite being on HCTZ for meniere's

## 2018-02-28 NOTE — Assessment & Plan Note (Addendum)
Improved now... After lasix course.  Due to diastolic HF and venous insufficiency.

## 2018-02-28 NOTE — Assessment & Plan Note (Signed)
Reviewed ECHO with patient. Fluid control and BP controlled.

## 2018-02-28 NOTE — Progress Notes (Signed)
   Subjective:    Patient ID: [name] [name], female    DOB: [birth year], 77 y.o.   MRN: [id number]  HPI 77 year old female presents  For follow up B LE  peripheral swelling.   She saw Dr. Darnell Level on 02/18/2018. BP as elevated  Eval with labs/UA neg except NA 134  Started on lasix 10 mg daily x 3 days, then as needed.  BP Readings from Last 3 Encounters:  02/28/18 (!) 150/78  02/19/18 (!) 142/78  02/18/18 (!) 162/82   On diovan 120, HCTZ, norvasc 10   Today she reports Lasix helped significantly with peripheral swelling. She is now back at baseline.  In last few day 127-130/70-80.  Wt Readings from Last 3 Encounters:  02/28/18 162 lb 4.8 oz (73.6 kg)  02/19/18 162 lb (73.5 kg)  02/18/18 162 lb 4 oz (73.6 kg)  Blood pressure (!) 150/78, pulse 74, temperature (!) 97.5 F (36.4 C), temperature source Oral, height 5' 2.5" (1.588 m), weight 162 lb 4.8 oz (73.6 kg), SpO2 97 %.  Review of Systems  Constitutional: Negative for fatigue and fever.  HENT: Negative for ear pain.   Eyes: Negative for pain.  Respiratory: Negative for chest tightness and shortness of breath.   Cardiovascular: Negative for chest pain, palpitations and leg swelling.  Gastrointestinal: Negative for abdominal pain.  Genitourinary: Negative for dysuria.       Objective:   Physical Exam  Constitutional: Vital signs are normal. She appears well-developed and well-nourished. She is cooperative.  Non-toxic appearance. She does not appear ill. No distress.  HENT:  Head: Normocephalic.  Right Ear: Hearing, tympanic membrane, external ear and ear canal normal. Tympanic membrane is not erythematous, not retracted and not bulging.  Left Ear: Hearing, tympanic membrane, external ear and ear canal normal. Tympanic membrane is not erythematous, not retracted and not bulging.  Nose: No mucosal edema or rhinorrhea. Right sinus exhibits no maxillary sinus tenderness and no frontal sinus tenderness. Left sinus exhibits no maxillary  sinus tenderness and no frontal sinus tenderness.  Mouth/Throat: Uvula is midline, oropharynx is clear and moist and mucous membranes are normal.  Eyes: Pupils are equal, round, and reactive to light. Conjunctivae, EOM and lids are normal. Lids are everted and swept, no foreign bodies found.  Neck: Trachea normal and normal range of motion. Neck supple. Carotid bruit is not present. No thyroid mass and no thyromegaly present.  Cardiovascular: Normal rate, regular rhythm, S1 normal, S2 normal, normal heart sounds, intact distal pulses and normal pulses. Exam reveals no gallop and no friction rub.  No murmur heard. No edema in right ankle, < 1plus edema in left ankle.  Pulmonary/Chest: Effort normal and breath sounds normal. No tachypnea. No respiratory distress. She has no decreased breath sounds. She has no wheezes. She has no rhonchi. She has no rales.  Abdominal: Soft. Normal appearance and bowel sounds are normal. There is no tenderness.  Neurological: She is alert.  Skin: Skin is warm, dry and intact. No rash noted.  Psychiatric: Her speech is normal and behavior is normal. Judgment and thought content normal. Her mood appears not anxious. Cognition and memory are normal. She does not exhibit a depressed mood.       Assessment & Plan:

## 2018-02-28 NOTE — Patient Instructions (Addendum)
Follow BP closely, goal < 140/90.  Elevate feet as much as able.  Slowly get back to exercise 3-5 times a week.. Will help with swelling, energy, breathing.

## 2018-03-01 ENCOUNTER — Ambulatory Visit (INDEPENDENT_AMBULATORY_CARE_PROVIDER_SITE_OTHER): Payer: Medicare Other

## 2018-03-01 DIAGNOSIS — I6521 Occlusion and stenosis of right carotid artery: Secondary | ICD-10-CM | POA: Diagnosis not present

## 2018-03-03 ENCOUNTER — Other Ambulatory Visit: Payer: Self-pay | Admitting: Family Medicine

## 2018-03-04 ENCOUNTER — Other Ambulatory Visit: Payer: Self-pay | Admitting: Family Medicine

## 2018-03-26 ENCOUNTER — Encounter: Payer: Self-pay | Admitting: Internal Medicine

## 2018-04-02 ENCOUNTER — Other Ambulatory Visit: Payer: Self-pay | Admitting: Family Medicine

## 2018-04-11 ENCOUNTER — Other Ambulatory Visit: Payer: Self-pay | Admitting: Family Medicine

## 2018-04-12 NOTE — Telephone Encounter (Signed)
Last office visit 02/28/2018 for lower extremity edema.  Last refilled 09/03/2017 for #60 with 3 refills.  Ok to refill?

## 2018-04-22 ENCOUNTER — Other Ambulatory Visit: Payer: Self-pay | Admitting: Family Medicine

## 2018-04-22 NOTE — Telephone Encounter (Signed)
Last office visit 02/28/2018 for edema.  Last refilled 02/19/2018 for #60 with no refills. Follow up scheduled for 07/12/2018.  Ok to refill?

## 2018-04-25 ENCOUNTER — Telehealth: Payer: Self-pay

## 2018-04-25 DIAGNOSIS — M545 Low back pain, unspecified: Secondary | ICD-10-CM

## 2018-04-25 NOTE — Telephone Encounter (Signed)
Left message for [name] [name] letting her know that Dr. Diona Browner was okay with her going to PT.  I ask that she call us back if she needs [place] to send a referral.

## 2018-04-25 NOTE — Telephone Encounter (Signed)
Okay for PT... Does she need a referral sent?

## 2018-04-25 NOTE — Telephone Encounter (Signed)
I spoke with pt; pt having lumbar region back pain on lt side; pain at waist line and not going down leg. More of an ache but if walks fast gets sharpe pain. Pt is restricting her activity. This is not a new symptom has had pain on and off for years. Pt thinks PT might help the pain; moist wet heat and tens like machine that helps pain, does not need manipulation at PT but a massage of back would be helpful. Pt last seen acute visit for lower extremity edema on 02/28/18. Pt request to make her own PT appt at PT on [address]pt has been taking Tylenol which helps pain somewhat but lately has been waking pt up at night.pt has appt with ortho for scoliosis in Sept. Please advise.

## 2018-04-25 NOTE — Telephone Encounter (Signed)
Copied from [place] [id number]. Topic: Quick Communication - See Telephone Encounter >> Apr 25, 2018 11:26 AM Antonieta Iba C wrote: CRM for notification. See Telephone encounter for: 04/25/18.  Pt says that she have back pain and would like to discuss PT. Pt says that when she have PT it helps.    CB: [phone]

## 2018-04-25 NOTE — Telephone Encounter (Signed)
Spoke with patient she is requesting [facility] contact number : [phone]. She stated this where she would like her referral. Please advise

## 2018-04-30 ENCOUNTER — Other Ambulatory Visit: Payer: Self-pay | Admitting: Family Medicine

## 2018-05-02 ENCOUNTER — Other Ambulatory Visit: Payer: Self-pay | Admitting: Family Medicine

## 2018-05-03 ENCOUNTER — Ambulatory Visit: Payer: Medicare Other | Admitting: Family Medicine

## 2018-05-03 MED ORDER — DIOVAN 40 MG PO TABS: 120.0000 mg | ORAL_TABLET | Freq: Every day | ORAL | 5 refills | Status: DC

## 2018-05-03 NOTE — Addendum Note (Signed)
Addended by: Helene Shoe on: 05/03/2018 09:07 AM   Modules accepted: Orders

## 2018-05-03 NOTE — Telephone Encounter (Signed)
I spoke with Colletta Maryland at [employer]. On  03/04/18 [facility] only received # 90 x 0. I advised per pt that pt only wants 30 day rx. Refilled # 90 x 5. Colletta Maryland said would be ready for pick up after 10:30 this morning. Pt voiced understanding.

## 2018-05-03 NOTE — Telephone Encounter (Signed)
Pt states she didn't receive #270 on 03/04/2018 she received #90. Pt states she is completely out  Cb# [phone]

## 2018-05-08 ENCOUNTER — Encounter: Payer: Self-pay | Admitting: Podiatry

## 2018-05-08 ENCOUNTER — Ambulatory Visit (INDEPENDENT_AMBULATORY_CARE_PROVIDER_SITE_OTHER): Payer: Medicare Other | Admitting: Podiatry

## 2018-05-08 DIAGNOSIS — Q828 Other specified congenital malformations of skin: Secondary | ICD-10-CM | POA: Diagnosis not present

## 2018-05-08 DIAGNOSIS — M79676 Pain in unspecified toe(s): Secondary | ICD-10-CM | POA: Diagnosis not present

## 2018-05-08 DIAGNOSIS — B351 Tinea unguium: Secondary | ICD-10-CM

## 2018-05-08 NOTE — Progress Notes (Signed)
She presents today chief complaint of painful elongated toenails.  Objective: Vital signs are stable alert and oriented x3.  Pulses are palpable.  Neurologic sensorium is intact degenerative flexors are intact toenails are long thick yellow dystrophic with mycotic painful palpation.  Assessment: Pain in limb secondary to onychomycosis.  Plan: Debridement of toenails 1 through 5 bilateral.

## 2018-05-13 ENCOUNTER — Ambulatory Visit: Payer: Medicare Other | Attending: Family Medicine

## 2018-05-13 DIAGNOSIS — M62830 Muscle spasm of back: Secondary | ICD-10-CM | POA: Diagnosis present

## 2018-05-13 DIAGNOSIS — M6281 Muscle weakness (generalized): Secondary | ICD-10-CM | POA: Diagnosis present

## 2018-05-13 DIAGNOSIS — G8929 Other chronic pain: Secondary | ICD-10-CM | POA: Insufficient documentation

## 2018-05-13 DIAGNOSIS — M545 Low back pain: Secondary | ICD-10-CM | POA: Insufficient documentation

## 2018-05-13 NOTE — Therapy (Signed)
[facility] [address], [place], [address] Phone: [phone]   Fax:  [phone]  Physical Therapy Evaluation  Patient Details  Name: [name] [name] MRN: [id number] Date of Birth: 12/26/1940 Referring Provider: Eliezer Lofts   Encounter Date: 05/13/2018  PT End of Session - 05/13/18 1738    Visit Number  1    Number of Visits  13    Date for PT Re-Evaluation  06/24/18    Authorization Type  1 / 10 G Code    PT Start Time  1600    PT Stop Time  1700    PT Time Calculation (min)  60 min    Activity Tolerance  Patient tolerated treatment well    Behavior During Therapy  [facility] for tasks assessed/performed       Past Medical History:  Diagnosis Date  . Arthritis   . Chronic lower back pain   . Diverticulitis   . GERD (gastroesophageal reflux disease)   . Hemorrhoids, internal, with bleeding   . Hiatal hernia   . History of colon polyps   . HLD (hyperlipidemia)   . Hypertension   . OSA (obstructive sleep apnea)   . PUD (peptic ulcer disease)   . Scoliosis   . Thyroid disease    hypo  . Thyroid nodule   . Vaginitis, atrophic   . Vertigo     Past Surgical History:  Procedure Laterality Date  . ANKLE FUSION  1967   LEFT  . CHOLECYSTECTOMY     2004  . COLONOSCOPY  06/22/2011   diverticulosis (no adenomas since 2001)  . FRACTURE SURGERY Left 1967   foot and ankle surgery    There were no vitals filed for this visit.   Subjective Assessment - 05/13/18 1622    Subjective  Patient reports increased LBP which started in 'the winter months' but recently increased in the beginning of August 2019. Patient states increased LBP along the R side of her back that radiates into the R hip. Patient reports increased pain with standing, lifting/bending, and walking distance. Patient reports she has increased pain immediately in standing. Patient reports she has been resisting the performance of all lumbar  mobility. Patient reports she avoids performing all bending and lifting activities secondary to increased pain. Patient states this limits the ability to perform household chores and other ADLs such as cooking for long periods of time.      Pertinent History  ankle fusion on the R side, lumbar scoliosis, menieres disease    Limitations  Lifting;Walking;Standing    How long can you walk comfortably?  Immediate pain    Patient Stated Goals  To decrease pain    Currently in Pain?  Yes    Pain Score  2    7/10 worst   Pain Location  Back    Pain Orientation  Lower    Pain Descriptors / Indicators  Aching;Sharp    Pain Type  Chronic pain;Acute pain    Pain Onset  More than a month ago    Pain Frequency  Intermittent         [facility] PT Assessment - 05/13/18 1615      Assessment   Medical Diagnosis  LBP    Referring Provider  Amy Bedsole    Onset Date/Surgical Date  04/04/18    Hand Dominance  Right    Next MD Visit  unknown    Prior Therapy  yes - LBP  Balance Screen   Has the patient fallen in the past 6 months  No    Has the patient had a decrease in activity level because of a fear of falling?   Yes    Is the patient reluctant to leave their home because of a fear of falling?   No      Home Film/video editor residence    Living Arrangements  Spouse/significant other    Available Help at Discharge  Family    Type of [place] to enter    Entrance Stairs-Number of Steps  45    Entrance Stairs-Rails  Can reach both    [place]  One level    [place] - single point;Shower seat;Grab bars - toilet;Grab bars - tub/shower      Prior Function   Level of Independence  Independent    Vocation  Retired    [employer]  N/A    Leisure  walking/exercising      Cognition   Overall Cognitive Status  Within Functional Limits for tasks assessed      Observation/Other Assessments   Observations  Decreased AROM  with pelvic tilts      Sensation   Light Touch  Appears Intact      Functional Tests   Functional tests  Sit to Stand      Sit to Stand   Comments  Requires use of hands      Posture/Postural Control   Posture Comments  Increased lumbar flexion in standing       ROM / Strength   AROM / PROM / Strength  AROM;Strength      AROM   AROM Assessment Site  Hip;Knee;Lumbar    Right/Left Hip  Right;Left    Right Hip Flexion  110    Right Hip External Rotation   30    Right Hip Internal Rotation   20   increased pain   Right Hip ABduction  20    Left Hip Flexion  100    Left Hip External Rotation   30    Left Hip Internal Rotation   20    Left Hip ABduction  20    Left Hip ADduction  20    Right/Left Knee  Right;Left    Right Knee Extension  -5    Right Knee Flexion  100    Left Knee Extension  -10    Left Knee Flexion  100    Lumbar Flexion  90% limited with pain    Lumbar Extension  80% limited with pain    Lumbar - Right Side Bend  70% limited with pain    Lumbar - Left Side Bend  60% limited with pain    Lumbar - Right Rotation  90% limited with pain    Lumbar - Left Rotation  90% limited with pain      Strength   Strength Assessment Site  Hip;Knee    Right/Left Hip  Right;Left    Right Hip Flexion  4-/5    Right Hip External Rotation   3+/5    Right Hip Internal Rotation  3/5   pain   Right Hip ABduction  3+/5    Right Hip ADduction  4-/5    Left Hip Flexion  4/5    Left Hip External Rotation  4-/5    Left Hip Internal Rotation  4-/5  Left Hip ABduction  4-/5    Left Hip ADduction  4-/5    Right/Left Knee  Right;Left    Right Knee Flexion  4/5    Right Knee Extension  4+/5    Left Knee Flexion  4/5    Left Knee Extension  4+/5      Palpation   Spinal mobility  L4-S1 increased pain upon palpation with central spinus processes    Palpation comment  Increased TTP along glute max (upper aspect) and multifidus on the R side       Ambulation/Gait   Gait  velocity  .68m/s    Gait Comments  Flat foot strike B, trendelenberg on the R, decreased step length B, decreased arm swing on the R      High Level Balance   High Level Balance Activites  Other (comment)    High Level Balance Comments  Tandem stance: >10sec; SLS: 2 sec B       Objective measurements completed on examination: See above findings.   TREATMENT: Therapeutic Exercise: LTRs in hooklying -- 2 x 10 Bridges in hooklying -- 2 x 10  Pelvic Tilts in hooklying -- 2 x 10   Patient demonstrates no increase in pain at the end of the session     PT Education - 05/13/18 1711    Education Details  HEP: LTRs, bridges, pelvic tilts     Person(s) Educated  Patient    Methods  Explanation;Demonstration;Handout    Comprehension  Returned demonstration;Tactile cues required;Verbal cues required;Verbalized understanding          PT Long Term Goals - 05/13/18 1743      PT LONG TERM GOAL #1   Title  Patient will be independent with exercise performance and progression to continue benefits of therapy after discharge    Baseline  form/technique with exercise    Time  6    Period  Weeks    Status  New    Target Date  06/24/18      PT LONG TERM GOAL #2   Title  Patient will improve 64mwt to over 24m/s to indicate a decrease in fall risk with performing community ambulation    Baseline  .71 m/s    Time  6    Period  Weeks    Status  New    Target Date  06/24/18      PT LONG TERM GOAL #3   Title  Patient will have a worst pain of 3/10 to indicate significant improvement and greater ability to perform standing with less pain allowing for performance of greater amount of ADLs.     Baseline  7/10 worst pain    Time  6    Period  Weeks    Status  New    Target Date  06/24/18             Plan - 05/13/18 1739    Clinical Impression Statement  Patient is [name] [name] yo right hand dominant female presenting with increased right sided acute on chronic low back from insidious onset in  August of 2019. Patient demonstrates lumbar dysfunction as indicated by significantly limited AROM, hip strength, and muscular coordination of the pelvis and lumbar region. Patient demosntrates increased fall risk from decreased single leg stance and ambulation time. Patient will benefit from further skilled therapy to return to prior level of funciton.     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    Clinical Presentation  Stable    Clinical Presentation due to:  Pain improving    Clinical Decision Making  Moderate    Rehab Potential  Good    Clinical Impairments Affecting Rehab Potential  (+) highly motivated, active lifestyle (-) chronicity, age    PT Frequency  2x / week    PT Duration  6 weeks    PT Treatment/Interventions  Gait training;Stair training;Functional mobility training;Therapeutic activities;Therapeutic exercise;Balance training;Electrical Stimulation;Cryotherapy;Iontophoresis 4mg /ml Dexamethasone;Moist Heat;Traction;Patient/family education;Manual techniques;Neuromuscular re-education;Passive range of motion;Dry needling    PT Next Visit Plan  progress exercise performance and technique    PT Home Exercise Plan  See education section(s)    Consulted and Agree with Plan of Care  Patient       Patient will benefit from skilled therapeutic intervention in order to improve the following deficits and impairments:  Decreased endurance, Decreased activity tolerance, Decreased balance, Pain, Decreased range of motion, Decreased strength, Difficulty walking, Abnormal gait, Increased fascial restricitons, Decreased coordination, Increased muscle spasms  Visit Diagnosis: Chronic right-sided low back pain without sciatica  Muscle weakness (generalized)  Muscle spasm of back     Problem List Patient Active Problem List   Diagnosis Date Noted  . Hyponatremia 01/08/2018  . Meniere's disease 01/08/2018  . Diastolic dysfunction  without heart failure 09/18/2017  . Lower extremity edema 08/09/2017  . Gait instability 08/09/2017  . Choking sensation 06/21/2017  . Inclusion cyst of vulva 05/14/2017  . Spasm of thoracic back muscle 05/11/2017  . Vulvar lesion 03/05/2017  . Situational anxiety 02/09/2017  . Puncture wound 01/05/2017  . Chronic insomnia 01/02/2017  . Counseling regarding end of life decision making 12/18/2014  . Raynaud phenomenon 08/31/2014  . Chronic allergic conjunctivitis 03/15/2014  . PAD (peripheral artery disease) ([place]) 03/05/2013  . Chronic back pain greater than 3 months duration 08/13/2012  . S/P ankle fusion 02/06/2011  . TEMPOROMANDIBULAR JOINT PAIN 05/20/2010  . Carotid stenosis 06/30/2009  . Multiple thyroid nodules 03/15/2009  . Obstructive sleep apnea 03/05/2009  . HEMORRHOIDS, INTERNAL, WITH BLEEDING 05/09/2007  . VERTIGO 12/18/2006  . COLONIC POLYPS 12/17/2006  . HYPERCHOLESTEROLEMIA 12/17/2006  . Essential hypertension 12/17/2006  . DIVERTICULAR DISEASE 12/17/2006  . VAGINITIS, ATROPHIC 12/17/2006  . Osteoarthritis, generalized 12/17/2006  . SCOLIOSIS 12/17/2006  . Seasonal and perennial allergic rhinitis 12/17/2006    Blythe Stanford, PT DPT 05/13/2018, 5:59 PM  [facility] [address], [place], [address] Phone: [phone]   Fax:  [phone]  Name: [name] [name] MRN: [id number] Date of Birth: 1941-08-15

## 2018-05-13 NOTE — Addendum Note (Signed)
Addended by: Blain Pais on: 05/13/2018 06:02 PM   Modules accepted: Orders

## 2018-05-14 ENCOUNTER — Ambulatory Visit: Payer: Medicare Other | Admitting: Obstetrics & Gynecology

## 2018-05-16 ENCOUNTER — Ambulatory Visit: Payer: Medicare Other

## 2018-05-16 DIAGNOSIS — M545 Low back pain: Principal | ICD-10-CM

## 2018-05-16 DIAGNOSIS — G8929 Other chronic pain: Secondary | ICD-10-CM

## 2018-05-16 DIAGNOSIS — M6281 Muscle weakness (generalized): Secondary | ICD-10-CM

## 2018-05-16 DIAGNOSIS — M62830 Muscle spasm of back: Secondary | ICD-10-CM

## 2018-05-16 NOTE — Therapy (Signed)
[facility] [address] [place], [address] Phone: [phone]   Fax:  [phone]  Physical Therapy Treatment  Patient Details  Name: [name] [name] MRN: [id number] Date of Birth: 05/08/1941 Referring Provider: Eliezer Lofts   Encounter Date: 05/16/2018  PT End of Session - 05/16/18 0919    Visit Number  2    Number of Visits  13    Date for PT Re-Evaluation  06/24/18    Authorization Type  2 / 10 G Code    PT Start Time  0900    PT Stop Time  0945    PT Time Calculation (min)  45 min    Activity Tolerance  Patient tolerated treatment well    Behavior During Therapy  [facility] for tasks assessed/performed       Past Medical History:  Diagnosis Date  . Arthritis   . Chronic lower back pain   . Diverticulitis   . GERD (gastroesophageal reflux disease)   . Hemorrhoids, internal, with bleeding   . Hiatal hernia   . History of colon polyps   . HLD (hyperlipidemia)   . Hypertension   . OSA (obstructive sleep apnea)   . PUD (peptic ulcer disease)   . Scoliosis   . Thyroid disease    hypo  . Thyroid nodule   . Vaginitis, atrophic   . Vertigo     Past Surgical History:  Procedure Laterality Date  . ANKLE FUSION  1967   LEFT  . CHOLECYSTECTOMY     2004  . COLONOSCOPY  06/22/2011   diverticulosis (no adenomas since 2001)  . FRACTURE SURGERY Left 1967   foot and ankle surgery    There were no vitals filed for this visit.  Subjective Assessment - 05/16/18 0911    Subjective  Patient reports the pain has been feeling better since the previous visit. Patient states  her pain ins increased in the morning and reports the pain comes and goes.     Pertinent History  ankle fusion on the R side, lumbar scoliosis, menieres disease    Limitations  Lifting;Walking;Standing    How long can you walk comfortably?  Immediate pain    Patient Stated Goals  To decrease pain    Currently in Pain?  Yes    Pain Score   5     Pain Location  Back    Pain Orientation  Lower    Pain Descriptors / Indicators  Aching    Pain Type  Chronic pain    Pain Onset  More than a month ago    Pain Frequency  Intermittent       TREATMENT Therapeutic Exercise Bridges in hooklying - x 20 ; x20 with YTB around knees  Knee to chest with physioball - x20  LTRs in hookyling - x 20  LTRs with feet on physioball - x 20  Hooklying pelvic tilts - x 20  Seated pelvic tilts - x 20  Hip abduction in standing with UE support - 3 x 10 Hip extension in standing with UE support - 3 x 10  Standing squats with UE - x 15  Patient demonstrates increased fatigue at the end of the session  PT Education - 05/16/18 0919    Education Details  form/technique with exercise    Person(s) Educated  Patient    Methods  Explanation;Demonstration    Comprehension  Verbalized understanding;Returned demonstration  PT Long Term Goals - 05/13/18 1743      PT LONG TERM GOAL #1   Title  Patient will be independent with exercise performance and progression to continue benefits of therapy after discharge    Baseline  form/technique with exercise    Time  6    Period  Weeks    Status  New    Target Date  06/24/18      PT LONG TERM GOAL #2   Title  Patient will improve 47mwt to over 21m/s to indicate a decrease in fall risk with performing community ambulation    Baseline  .71 m/s    Time  6    Period  Weeks    Status  New    Target Date  06/24/18      PT LONG TERM GOAL #3   Title  Patient will have a worst pain of 3/10 to indicate significant improvement and greater ability to perform standing with less pain allowing for performance of greater amount of ADLs.     Baseline  7/10 worst pain    Time  6    Period  Weeks    Status  New    Target Date  06/24/18            Plan - 05/16/18 0945    Clinical Impression Statement  Patient deomsntrates improvement in lumbar pain and spasms after performance of exercises  indicating improvement in motor control and coordination. Patient demonstrates decreased mobility with performance of pelvic tilts and patient requires tactile cueing from PT to perform correctly. Patient will benefit from further skilled therapy to return to prior level of function.     Rehab Potential  Good    Clinical Impairments Affecting Rehab Potential  (+) highly motivated, active lifestyle (-) chronicity, age    PT Frequency  2x / week    PT Duration  6 weeks    PT Treatment/Interventions  Gait training;Stair training;Functional mobility training;Therapeutic activities;Therapeutic exercise;Balance training;Electrical Stimulation;Cryotherapy;Iontophoresis 4mg /ml Dexamethasone;Moist Heat;Traction;Patient/family education;Manual techniques;Neuromuscular re-education;Passive range of motion;Dry needling    PT Next Visit Plan  progress exercise performance and technique    PT Home Exercise Plan  See education section(s)    Consulted and Agree with Plan of Care  Patient       Patient will benefit from skilled therapeutic intervention in order to improve the following deficits and impairments:  Decreased endurance, Decreased activity tolerance, Decreased balance, Pain, Decreased range of motion, Decreased strength, Difficulty walking, Abnormal gait, Increased fascial restricitons, Decreased coordination, Increased muscle spasms  Visit Diagnosis: Chronic right-sided low back pain without sciatica  Muscle weakness (generalized)  Muscle spasm of back     Problem List Patient Active Problem List   Diagnosis Date Noted  . Hyponatremia 01/08/2018  . Meniere's disease 01/08/2018  . 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 Lower extremity edema 08/09/2017  . Gait instability 08/09/2017  . Choking sensation 06/21/2017  . Inclusion cyst of vulva 05/14/2017  . Spasm of thoracic back muscle 05/11/2017  . Vulvar lesion 03/05/2017  . Situational anxiety 02/09/2017  . Puncture  wound 01/05/2017  . Chronic insomnia 01/02/2017  . Counseling regarding end of life decision making 12/18/2014  . Raynaud phenomenon 08/31/2014  . Chronic allergic conjunctivitis 03/15/2014  . PAD (peripheral artery disease) ([name]) 03/05/2013  . Chronic back pain greater than 3 months duration 08/13/2012  . S/P ankle fusion 02/06/2011  . TEMPOROMANDIBULAR JOINT PAIN 05/20/2010  . Carotid stenosis 06/30/2009  .  Multiple thyroid nodules 03/15/2009  . Obstructive sleep apnea 03/05/2009  . HEMORRHOIDS, INTERNAL, WITH BLEEDING 05/09/2007  . VERTIGO 12/18/2006  . COLONIC POLYPS 12/17/2006  . HYPERCHOLESTEROLEMIA 12/17/2006  . Essential hypertension 12/17/2006  . DIVERTICULAR DISEASE 12/17/2006  . VAGINITIS, ATROPHIC 12/17/2006  . Osteoarthritis, generalized 12/17/2006  . SCOLIOSIS 12/17/2006  . Seasonal and perennial allergic rhinitis 12/17/2006    Blythe Stanford, PT DPT 05/16/2018, 10:23 AM  [facility] [address], [place], [address] Phone: [phone]   Fax:  [phone]  Name: [name] [name] MRN: [id number] Date of Birth: 03-05-1941

## 2018-05-21 ENCOUNTER — Ambulatory Visit: Payer: Medicare Other

## 2018-05-21 DIAGNOSIS — M545 Low back pain: Principal | ICD-10-CM

## 2018-05-21 DIAGNOSIS — M6281 Muscle weakness (generalized): Secondary | ICD-10-CM

## 2018-05-21 DIAGNOSIS — G8929 Other chronic pain: Secondary | ICD-10-CM

## 2018-05-21 DIAGNOSIS — M62830 Muscle spasm of back: Secondary | ICD-10-CM

## 2018-05-21 NOTE — Therapy (Signed)
[facility] [address], [place], [address] Phone: [phone]   Fax:  [phone]  Physical Therapy Treatment  Patient Details  Name: [name] [name] MRN: [id number] Date of Birth: Mar 31, 1941 Referring Provider: Eliezer Lofts   Encounter Date: 05/21/2018  PT End of Session - 05/21/18 1137    Visit Number  3    Number of Visits  13    Date for PT Re-Evaluation  06/24/18    Authorization Type  3 / 10 G Code    PT Start Time  1030    PT Stop Time  1115    PT Time Calculation (min)  45 min    Activity Tolerance  Patient tolerated treatment well    Behavior During Therapy  [facility] for tasks assessed/performed       Past Medical History:  Diagnosis Date  . Arthritis   . Chronic lower back pain   . Diverticulitis   . GERD (gastroesophageal reflux disease)   . Hemorrhoids, internal, with bleeding   . Hiatal hernia   . History of colon polyps   . HLD (hyperlipidemia)   . Hypertension   . OSA (obstructive sleep apnea)   . PUD (peptic ulcer disease)   . Scoliosis   . Thyroid disease    hypo  . Thyroid nodule   . Vaginitis, atrophic   . Vertigo     Past Surgical History:  Procedure Laterality Date  . ANKLE FUSION  1967   LEFT  . CHOLECYSTECTOMY     2004  . COLONOSCOPY  06/22/2011   diverticulosis (no adenomas since 2001)  . FRACTURE SURGERY Left 1967   foot and ankle surgery    There were no vitals filed for this visit.  Subjective Assessment - 05/21/18 1130    Subjective  Patient reports she is improving and states the pain is not as intense but continues to have pain with performing quick movements in standing.     Pertinent History  ankle fusion on the R side, lumbar scoliosis, menieres disease    Limitations  Lifting;Walking;Standing    How long can you walk comfortably?  Immediate pain    Patient Stated Goals  To decrease pain    Currently in Pain?  Yes    Pain Score  3     Pain Location   Back    Pain Orientation  Lower    Pain Descriptors / Indicators  Aching    Pain Type  Chronic pain    Pain Onset  More than a month ago    Pain Frequency  Intermittent       TREATMENT Therapeutic Exercise Seated pelvic tilts - x 20  Standing back extension CKC - x 20  Standing squats with UE support- x 15 Sitting prayer stretch with physioball  Hip abduction in standing with UE support - 2 x 10 Hip extension in standing with UE support - 2 x 10  Side stepping with YTB around knees - x 34ft B     Patient demonstrates increased fatigue at the end of the session   PT Education - 05/21/18 1137    Education Details  form/technique with exercise    Person(s) Educated  Patient    Methods  Explanation;Demonstration    Comprehension  Verbalized understanding;Returned demonstration          PT Long Term Goals - 05/13/18 1743      PT LONG TERM GOAL #1   Title  Patient will be independent with exercise performance and progression to continue benefits of therapy after discharge    Baseline  form/technique with exercise    Time  6    Period  Weeks    Status  New    Target Date  06/24/18      PT LONG TERM GOAL #2   Title  Patient will improve 69mwt to over 68m/s to indicate a decrease in fall risk with performing community ambulation    Baseline  .71 m/s    Time  6    Period  Weeks    Status  New    Target Date  06/24/18      PT LONG TERM GOAL #3   Title  Patient will have a worst pain of 3/10 to indicate significant improvement and greater ability to perform standing with less pain allowing for performance of greater amount of ADLs.     Baseline  7/10 worst pain    Time  6    Period  Weeks    Status  New    Target Date  06/24/18            Plan - 05/21/18 1144    Clinical Impression Statement  Patient demonstrates improvement with ability to perform greater amount of standing exercises compared to the previous sessions. Patient continues to have limited lumbar  AROM and muscular strength in the supporting musculature. Patient will benefit from further skilled therapy to return to prior level of function.     Rehab Potential  Good    Clinical Impairments Affecting Rehab Potential  (+) highly motivated, active lifestyle (-) chronicity, age    PT Frequency  2x / week    PT Duration  6 weeks    PT Treatment/Interventions  Gait training;Stair training;Functional mobility training;Therapeutic activities;Therapeutic exercise;Balance training;Electrical Stimulation;Cryotherapy;Iontophoresis 4mg /ml Dexamethasone;Moist Heat;Traction;Patient/family education;Manual techniques;Neuromuscular re-education;Passive range of motion;Dry needling    PT Next Visit Plan  progress exercise performance and technique    PT Home Exercise Plan  See education section(s)    Consulted and Agree with Plan of Care  Patient       Patient will benefit from skilled therapeutic intervention in order to improve the following deficits and impairments:  Decreased endurance, Decreased activity tolerance, Decreased balance, Pain, Decreased range of motion, Decreased strength, Difficulty walking, Abnormal gait, Increased fascial restricitons, Decreased coordination, Increased muscle spasms  Visit Diagnosis: Chronic right-sided low back pain without sciatica  Muscle weakness (generalized)  Muscle spasm of back     Problem List Patient Active Problem List   Diagnosis Date Noted  . Hyponatremia 01/08/2018  . Meniere's disease 01/08/2018  . 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 Lower extremity edema 08/09/2017  . Gait instability 08/09/2017  . Choking sensation 06/21/2017  . Inclusion cyst of vulva 05/14/2017  . Spasm of thoracic back muscle 05/11/2017  . Vulvar lesion 03/05/2017  . Situational anxiety 02/09/2017  . Puncture wound 01/05/2017  . Chronic insomnia 01/02/2017  . Counseling regarding end of life decision making 12/18/2014  . Raynaud phenomenon  08/31/2014  . Chronic allergic conjunctivitis 03/15/2014  . PAD (peripheral artery disease) ([place]) 03/05/2013  . Chronic back pain greater than 3 months duration 08/13/2012  . S/P ankle fusion 02/06/2011  . TEMPOROMANDIBULAR JOINT PAIN 05/20/2010  . Carotid stenosis 06/30/2009  . Multiple thyroid nodules 03/15/2009  . Obstructive sleep apnea 03/05/2009  . HEMORRHOIDS, INTERNAL, WITH BLEEDING 05/09/2007  . VERTIGO 12/18/2006  . COLONIC POLYPS 12/17/2006  .  HYPERCHOLESTEROLEMIA 12/17/2006  . Essential hypertension 12/17/2006  . DIVERTICULAR DISEASE 12/17/2006  . VAGINITIS, ATROPHIC 12/17/2006  . Osteoarthritis, generalized 12/17/2006  . SCOLIOSIS 12/17/2006  . Seasonal and perennial allergic rhinitis 12/17/2006    Blythe Stanford, PT DPT 05/21/2018, 11:47 AM  [facility] [address], [place], [address] Phone: [phone]   Fax:  [phone]  Name: [name] [name] MRN: [id number] Date of Birth: 1941/02/12

## 2018-05-23 ENCOUNTER — Ambulatory Visit: Payer: Medicare Other

## 2018-05-23 DIAGNOSIS — M545 Low back pain, unspecified: Secondary | ICD-10-CM

## 2018-05-23 DIAGNOSIS — G8929 Other chronic pain: Secondary | ICD-10-CM

## 2018-05-23 DIAGNOSIS — M6281 Muscle weakness (generalized): Secondary | ICD-10-CM

## 2018-05-23 DIAGNOSIS — M62830 Muscle spasm of back: Secondary | ICD-10-CM

## 2018-05-23 NOTE — Therapy (Signed)
[facility] [address], [place], [address] Phone: [phone]   Fax:  [phone]  Physical Therapy Treatment  Patient Details  Name: [name] [name] MRN: [id number] Date of Birth: 05-20-1941 Referring Provider: Eliezer Lofts   Encounter Date: 05/23/2018  PT End of Session - 05/23/18 1416    Visit Number  4    Number of Visits  13    Date for PT Re-Evaluation  06/24/18    Authorization Type  4 / 10 G Code    PT Start Time  1300    PT Stop Time  7035    PT Time Calculation (min)  45 min    Activity Tolerance  Patient tolerated treatment well    Behavior During Therapy  [facility] for tasks assessed/performed       Past Medical History:  Diagnosis Date  . Arthritis   . Chronic lower back pain   . Diverticulitis   . GERD (gastroesophageal reflux disease)   . Hemorrhoids, internal, with bleeding   . Hiatal hernia   . History of colon polyps   . HLD (hyperlipidemia)   . Hypertension   . OSA (obstructive sleep apnea)   . PUD (peptic ulcer disease)   . Scoliosis   . Thyroid disease    hypo  . Thyroid nodule   . Vaginitis, atrophic   . Vertigo     Past Surgical History:  Procedure Laterality Date  . ANKLE FUSION  1967   LEFT  . CHOLECYSTECTOMY     2004  . COLONOSCOPY  06/22/2011   diverticulosis (no adenomas since 2001)  . FRACTURE SURGERY Left 1967   foot and ankle surgery    There were no vitals filed for this visit.  Subjective Assessment - 05/23/18 1411    Subjective  Patient reports continued improvement with exercises but states increased fatigue with walking for long periods of time.     Pertinent History  ankle fusion on the R side, lumbar scoliosis, menieres disease    Limitations  Lifting;Walking;Standing    How long can you walk comfortably?  Immediate pain    Patient Stated Goals  To decrease pain    Currently in Pain?  Yes    Pain Score  3     Pain Location  Back    Pain  Orientation  Lower    Pain Descriptors / Indicators  Aching    Pain Type  Chronic pain    Pain Onset  More than a month ago    Pain Frequency  Intermittent       TREATMENT Therapeutic Exercise: Standing hip abduction at machine - x 20 10# B Standing hip extension at machine - x 20 40# B Ball straight arm rotations - x 20 B with physioball Ball straight arm flexion - x20 B with physioball Pelvic tilts in sitting - x 25 Sit to stands - x20, x 10 from raised seat height Nustep in sitting level 1 seat - x 5 min   Patient demonstrates increased fatigue at the end of the session  PT Education - 05/23/18 1416    Education Details  form/technique with exercise    Person(s) Educated  Patient    Methods  Explanation;Demonstration    Comprehension  Verbalized understanding;Returned demonstration          PT Long Term Goals - 05/13/18 1743      PT LONG TERM GOAL #1   Title  Patient will be  independent with exercise performance and progression to continue benefits of therapy after discharge    Baseline  form/technique with exercise    Time  6    Period  Weeks    Status  New    Target Date  06/24/18      PT LONG TERM GOAL #2   Title  Patient will improve 32mwt to over 40m/s to indicate a decrease in fall risk with performing community ambulation    Baseline  .71 m/s    Time  6    Period  Weeks    Status  New    Target Date  06/24/18      PT LONG TERM GOAL #3   Title  Patient will have a worst pain of 3/10 to indicate significant improvement and greater ability to perform standing with less pain allowing for performance of greater amount of ADLs.     Baseline  7/10 worst pain    Time  6    Period  Weeks    Status  New    Target Date  06/24/18            Plan - 05/23/18 1417    Clinical Impression Statement  Patient demonstrates increased fatigue with performing hip extension and abduction indicating poor muscular strength and endurance. Patient demonstrates improvement  with exercise and is able to perform a greater amount of exercises compared to the previous visits. Patient will benefit from further skilled therapy to return to prior level of function.     Rehab Potential  Good    Clinical Impairments Affecting Rehab Potential  (+) highly motivated, active lifestyle (-) chronicity, age    PT Frequency  2x / week    PT Duration  6 weeks    PT Treatment/Interventions  Gait training;Stair training;Functional mobility training;Therapeutic activities;Therapeutic exercise;Balance training;Electrical Stimulation;Cryotherapy;Iontophoresis 4mg /ml Dexamethasone;Moist Heat;Traction;Patient/family education;Manual techniques;Neuromuscular re-education;Passive range of motion;Dry needling    PT Next Visit Plan  progress exercise performance and technique    PT Home Exercise Plan  See education section(s)    Consulted and Agree with Plan of Care  Patient       Patient will benefit from skilled therapeutic intervention in order to improve the following deficits and impairments:  Decreased endurance, Decreased activity tolerance, Decreased balance, Pain, Decreased range of motion, Decreased strength, Difficulty walking, Abnormal gait, Increased fascial restricitons, Decreased coordination, Increased muscle spasms  Visit Diagnosis: Chronic right-sided low back pain without sciatica  Muscle weakness (generalized)  Muscle spasm of back     Problem List Patient Active Problem List   Diagnosis Date Noted  . Hyponatremia 01/08/2018  . Meniere's disease 01/08/2018  . 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 Lower extremity edema 08/09/2017  . Gait instability 08/09/2017  . Choking sensation 06/21/2017  . Inclusion cyst of vulva 05/14/2017  . Spasm of thoracic back muscle 05/11/2017  . Vulvar lesion 03/05/2017  . Situational anxiety 02/09/2017  . Puncture wound 01/05/2017  . Chronic insomnia 01/02/2017  . Counseling regarding end of life decision  making 12/18/2014  . Raynaud phenomenon 08/31/2014  . Chronic allergic conjunctivitis 03/15/2014  . PAD (peripheral artery disease) ([place]) 03/05/2013  . Chronic back pain greater than 3 months duration 08/13/2012  . S/P ankle fusion 02/06/2011  . TEMPOROMANDIBULAR JOINT PAIN 05/20/2010  . Carotid stenosis 06/30/2009  . Multiple thyroid nodules 03/15/2009  . Obstructive sleep apnea 03/05/2009  . HEMORRHOIDS, INTERNAL, WITH BLEEDING 05/09/2007  . VERTIGO 12/18/2006  . COLONIC POLYPS 12/17/2006  .  HYPERCHOLESTEROLEMIA 12/17/2006  . Essential hypertension 12/17/2006  . DIVERTICULAR DISEASE 12/17/2006  . VAGINITIS, ATROPHIC 12/17/2006  . Osteoarthritis, generalized 12/17/2006  . SCOLIOSIS 12/17/2006  . Seasonal and perennial allergic rhinitis 12/17/2006    Blythe Stanford, PT DPT 05/23/2018, 2:23 PM  [facility] [address], [place], [address] Phone: [phone]   Fax:  [phone]  Name: [name] [name] MRN: [id number] Date of Birth: 1940-11-05

## 2018-05-28 ENCOUNTER — Ambulatory Visit: Payer: Medicare Other

## 2018-05-28 DIAGNOSIS — G8929 Other chronic pain: Secondary | ICD-10-CM

## 2018-05-28 DIAGNOSIS — M545 Low back pain: Secondary | ICD-10-CM | POA: Diagnosis not present

## 2018-05-28 DIAGNOSIS — M62830 Muscle spasm of back: Secondary | ICD-10-CM

## 2018-05-28 DIAGNOSIS — M6281 Muscle weakness (generalized): Secondary | ICD-10-CM

## 2018-05-28 NOTE — Therapy (Signed)
[facility] [facility] [address], [place], [address] Phone: [phone]   Fax:  [phone]  Physical Therapy Treatment  Patient Details  Name: [name] [name] MRN: [id number] Date of Birth: 03-30-1941 Referring Provider: Eliezer Lofts   Encounter Date: 05/28/2018  PT End of Session - 05/28/18 1146    Visit Number  5    Number of Visits  13    Date for PT Re-Evaluation  06/24/18    Authorization Type  5 / 10 G Code    PT Start Time  1115    PT Stop Time  1200    PT Time Calculation (min)  45 min    Activity Tolerance  Patient tolerated treatment well    Behavior During Therapy  [facility] for tasks assessed/performed       Past Medical History:  Diagnosis Date  . Arthritis   . Chronic lower back pain   . Diverticulitis   . GERD (gastroesophageal reflux disease)   . Hemorrhoids, internal, with bleeding   . Hiatal hernia   . History of colon polyps   . HLD (hyperlipidemia)   . Hypertension   . OSA (obstructive sleep apnea)   . PUD (peptic ulcer disease)   . Scoliosis   . Thyroid disease    hypo  . Thyroid nodule   . Vaginitis, atrophic   . Vertigo     Past Surgical History:  Procedure Laterality Date  . ANKLE FUSION  1967   LEFT  . CHOLECYSTECTOMY     2004  . COLONOSCOPY  06/22/2011   diverticulosis (no adenomas since 2001)  . FRACTURE SURGERY Left 1967   foot and ankle surgery    There were no vitals filed for this visit.  Subjective Assessment - 05/28/18 1139    Subjective  Patient reports increased stiffness along her lumbar spine and states she would like to know where her S1 facet is located.     Pertinent History  ankle fusion on the R side, lumbar scoliosis, menieres disease    Limitations  Lifting;Walking;Standing    How long can you walk comfortably?  Immediate pain    Patient Stated Goals  To decrease pain    Currently in Pain?  Yes    Pain Score  4     Pain Location  Back    Pain  Orientation  Lower    Pain Descriptors / Indicators  Aching    Pain Type  Chronic pain    Pain Onset  More than a month ago    Pain Frequency  Intermittent        TREATMENT Therapeutic Exercise: Seated physioball prayer stretch - x20 Standing hip abduction at machine - 2 x 10 25# B Standing hip extension at machine - x 20 40# B, x 10 B 55# Standing squats with B UE support - x 20 Standing lumbar side bending with CKC - x 20  Sit to stands - x20, x 20 with holding volleyball with shoulders at 90degrees. Nustep in sitting with UE support - x 10 min level 1    Patient demonstrates increased fatigue at the end of the session    PT Education - 05/28/18 1146    Education Details  form/technique with exercise    Person(s) Educated  Patient    Methods  Explanation;Demonstration    Comprehension  Verbalized understanding;Returned demonstration          PT Long Term Goals - 05/13/18 1743  PT LONG TERM GOAL #1   Title  Patient will be independent with exercise performance and progression to continue benefits of therapy after discharge    Baseline  form/technique with exercise    Time  6    Period  Weeks    Status  New    Target Date  06/24/18      PT LONG TERM GOAL #2   Title  Patient will improve 27mwt to over 91m/s to indicate a decrease in fall risk with performing community ambulation    Baseline  .71 m/s    Time  6    Period  Weeks    Status  New    Target Date  06/24/18      PT LONG TERM GOAL #3   Title  Patient will have a worst pain of 3/10 to indicate significant improvement and greater ability to perform standing with less pain allowing for performance of greater amount of ADLs.     Baseline  7/10 worst pain    Time  6    Period  Weeks    Status  New    Target Date  06/24/18            Plan - 05/28/18 1151    Clinical Impression Statement  Patient demonstrates improvement with exercise performance with patient ability to perform greater amount of  exercise with greater amount of resistance indicating functional carryover between sessions. Patient demonstrates significant hip weakness most significant in hip ext and abd indicating further lumbar/hip dysfunction. Patient will benefit from further skilled therapy to return to prior level of function.     Rehab Potential  Good    Clinical Impairments Affecting Rehab Potential  (+) highly motivated, active lifestyle (-) chronicity, age    PT Frequency  2x / week    PT Duration  6 weeks    PT Treatment/Interventions  Gait training;Stair training;Functional mobility training;Therapeutic activities;Therapeutic exercise;Balance training;Electrical Stimulation;Cryotherapy;Iontophoresis 4mg /ml Dexamethasone;Moist Heat;Traction;Patient/family education;Manual techniques;Neuromuscular re-education;Passive range of motion;Dry needling    PT Next Visit Plan  progress exercise performance and technique    PT Home Exercise Plan  See education section(s)    Consulted and Agree with Plan of Care  Patient       Patient will benefit from skilled therapeutic intervention in order to improve the following deficits and impairments:  Decreased endurance, Decreased activity tolerance, Decreased balance, Pain, Decreased range of motion, Decreased strength, Difficulty walking, Abnormal gait, Increased fascial restricitons, Decreased coordination, Increased muscle spasms  Visit Diagnosis: Chronic right-sided low back pain without sciatica  Muscle weakness (generalized)  Muscle spasm of back     Problem List Patient Active Problem List   Diagnosis Date Noted  . Hyponatremia 01/08/2018  . Meniere's disease 01/08/2018  . 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 Lower extremity edema 08/09/2017  . Gait instability 08/09/2017  . Choking sensation 06/21/2017  . Inclusion cyst of vulva 05/14/2017  . Spasm of thoracic back muscle 05/11/2017  . Vulvar lesion 03/05/2017  . Situational anxiety  02/09/2017  . Puncture wound 01/05/2017  . Chronic insomnia 01/02/2017  . Counseling regarding end of life decision making 12/18/2014  . Raynaud phenomenon 08/31/2014  . Chronic allergic conjunctivitis 03/15/2014  . PAD (peripheral artery disease) ([place]) 03/05/2013  . Chronic back pain greater than 3 months duration 08/13/2012  . S/P ankle fusion 02/06/2011  . TEMPOROMANDIBULAR JOINT PAIN 05/20/2010  . Carotid stenosis 06/30/2009  . Multiple thyroid nodules 03/15/2009  . Obstructive sleep apnea  03/05/2009  . HEMORRHOIDS, INTERNAL, WITH BLEEDING 05/09/2007  . VERTIGO 12/18/2006  . COLONIC POLYPS 12/17/2006  . HYPERCHOLESTEROLEMIA 12/17/2006  . Essential hypertension 12/17/2006  . DIVERTICULAR DISEASE 12/17/2006  . VAGINITIS, ATROPHIC 12/17/2006  . Osteoarthritis, generalized 12/17/2006  . SCOLIOSIS 12/17/2006  . Seasonal and perennial allergic rhinitis 12/17/2006    Blythe Stanford, PT DPT 05/28/2018, 12:03 PM  [place] [facility] [address], [place], [address] Phone: [phone]   Fax:  [phone]  Name: [name] [name] MRN: [id number] Date of Birth: 1940/09/21

## 2018-05-30 ENCOUNTER — Ambulatory Visit: Payer: Medicare Other

## 2018-05-30 DIAGNOSIS — M6281 Muscle weakness (generalized): Secondary | ICD-10-CM

## 2018-05-30 DIAGNOSIS — M545 Low back pain: Principal | ICD-10-CM

## 2018-05-30 DIAGNOSIS — M62830 Muscle spasm of back: Secondary | ICD-10-CM

## 2018-05-30 DIAGNOSIS — G8929 Other chronic pain: Secondary | ICD-10-CM

## 2018-05-30 NOTE — Therapy (Signed)
[facility] [address] [place], [address] Phone: [phone]   Fax:  [phone]  Physical Therapy Treatment  Patient Details  Name: [name] [name] MRN: [id number] Date of Birth: [birth year] Referring Provider (PT): Amy Diona Browner   Encounter Date: 05/30/2018  PT End of Session - 05/30/18 1158    Visit Number  6    Number of Visits  13    Date for PT Re-Evaluation  06/24/18    Authorization Type  6 / 10 G Code    PT Start Time  1115    PT Stop Time  1200    PT Time Calculation (min)  45 min    Activity Tolerance  Patient tolerated treatment well    Behavior During Therapy  [facility] for tasks assessed/performed       Past Medical History:  Diagnosis Date  . Arthritis   . Chronic lower back pain   . Diverticulitis   . GERD (gastroesophageal reflux disease)   . Hemorrhoids, internal, with bleeding   . Hiatal hernia   . History of colon polyps   . HLD (hyperlipidemia)   . Hypertension   . OSA (obstructive sleep apnea)   . PUD (peptic ulcer disease)   . Scoliosis   . Thyroid disease    hypo  . Thyroid nodule   . Vaginitis, atrophic   . Vertigo     Past Surgical History:  Procedure Laterality Date  . ANKLE FUSION  1967   LEFT  . CHOLECYSTECTOMY     2004  . COLONOSCOPY  06/22/2011   diverticulosis (no adenomas since 2001)  . FRACTURE SURGERY Left 1967   foot and ankle surgery    There were no vitals filed for this visit.  Subjective Assessment - 05/30/18 1133    Subjective  Patient reports she was feeling a little stiff today but is overall improving. Patient reports she enjoys to exercise.     Pertinent History  ankle fusion on the R side, lumbar scoliosis, menieres disease    Limitations  Lifting;Walking;Standing    How long can you walk comfortably?  Immediate pain    Patient Stated Goals  To decrease pain    Currently in Pain?  Yes    Pain Score  4     Pain Location  Back    Pain  Orientation  Lower    Pain Descriptors / Indicators  Aching    Pain Type  Chronic pain    Pain Onset  More than a month ago    Pain Frequency  Intermittent        TREATMENT Therapeutic Exercise: Overhead with holding physioball with lumbar extension - x 20 Lumbar rotation with holding physioball - x 20 B  Standing hip abduction at machine - x 10 25# B, x10 10# Standing hip extension at machine - x 20 40# B, Seated physioball prayer stretch - x20 Seated ball squeeze butt squeeze - x 20 with 2kg ball  Standing squats with focus on activating glute and core musculature - x 10  Standing hip extension - x 20  Nustep in sitting with UE support - x 10 min level 2  Patient demonstrates increased fatigue at the end of the session   PT Education - 05/30/18 1138    Education Details  form/technique with exercise    Person(s) Educated  Patient    Methods  Explanation;Demonstration    Comprehension  Verbalized understanding;Returned demonstration  PT Long Term Goals - 05/13/18 1743      PT LONG TERM GOAL #1   Title  Patient will be independent with exercise performance and progression to continue benefits of therapy after discharge    Baseline  form/technique with exercise    Time  6    Period  Weeks    Status  New    Target Date  06/24/18      PT LONG TERM GOAL #2   Title  Patient will improve 1mwt to over 44m/s to indicate a decrease in fall risk with performing community ambulation    Baseline  .71 m/s    Time  6    Period  Weeks    Status  New    Target Date  06/24/18      PT LONG TERM GOAL #3   Title  Patient will have a worst pain of 3/10 to indicate significant improvement and greater ability to perform standing with less pain allowing for performance of greater amount of ADLs.     Baseline  7/10 worst pain    Time  6    Period  Weeks    Status  New    Target Date  06/24/18            Plan - 05/30/18 1201    Clinical Impression Statement  Patient  demonstrates ability to perform greater standing exercises today compared to the previous visits. Patient demonstrates increased pain along her L glute after performing lumbar flexion and demonstrates decreased pain after performing glute squeezes. Patient will benefit from further skilled therapy to return to prior level of function.     Rehab Potential  Good    Clinical Impairments Affecting Rehab Potential  (+) highly motivated, active lifestyle (-) chronicity, age    PT Frequency  2x / week    PT Duration  6 weeks    PT Treatment/Interventions  Gait training;Stair training;Functional mobility training;Therapeutic activities;Therapeutic exercise;Balance training;Electrical Stimulation;Cryotherapy;Iontophoresis 4mg /ml Dexamethasone;Moist Heat;Traction;Patient/family education;Manual techniques;Neuromuscular re-education;Passive range of motion;Dry needling    PT Next Visit Plan  progress exercise performance and technique    PT Home Exercise Plan  See education section(s)    Consulted and Agree with Plan of Care  Patient       Patient will benefit from skilled therapeutic intervention in order to improve the following deficits and impairments:  Decreased endurance, Decreased activity tolerance, Decreased balance, Pain, Decreased range of motion, Decreased strength, Difficulty walking, Abnormal gait, Increased fascial restricitons, Decreased coordination, Increased muscle spasms  Visit Diagnosis: Chronic right-sided low back pain without sciatica  Muscle weakness (generalized)  Muscle spasm of back     Problem List Patient Active Problem List   Diagnosis Date Noted  . Hyponatremia 01/08/2018  . Meniere's disease 01/08/2018  . 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 Lower extremity edema 08/09/2017  . Gait instability 08/09/2017  . Choking sensation 06/21/2017  . Inclusion cyst of vulva 05/14/2017  . Spasm of thoracic back muscle 05/11/2017  . Vulvar lesion  03/05/2017  . Situational anxiety 02/09/2017  . Puncture wound 01/05/2017  . Chronic insomnia 01/02/2017  . Counseling regarding end of life decision making 12/18/2014  . Raynaud phenomenon 08/31/2014  . Chronic allergic conjunctivitis 03/15/2014  . PAD (peripheral artery disease) ([place]) 03/05/2013  . Chronic back pain greater than 3 months duration 08/13/2012  . S/P ankle fusion 02/06/2011  . TEMPOROMANDIBULAR JOINT PAIN 05/20/2010  . Carotid stenosis 06/30/2009  . Multiple thyroid nodules 03/15/2009  .  Obstructive sleep apnea 03/05/2009  . HEMORRHOIDS, INTERNAL, WITH BLEEDING 05/09/2007  . VERTIGO 12/18/2006  . COLONIC POLYPS 12/17/2006  . HYPERCHOLESTEROLEMIA 12/17/2006  . Essential hypertension 12/17/2006  . DIVERTICULAR DISEASE 12/17/2006  . VAGINITIS, ATROPHIC 12/17/2006  . Osteoarthritis, generalized 12/17/2006  . SCOLIOSIS 12/17/2006  . Seasonal and perennial allergic rhinitis 12/17/2006    Blythe Stanford, PT DPT 05/30/2018, 12:49 PM  [facility] [address], [place], [address] Phone: [phone]   Fax:  [phone]  Name: [name] [name] MRN: [id number] Date of Birth: [birth year]

## 2018-06-04 ENCOUNTER — Ambulatory Visit: Payer: Medicare Other | Attending: Family Medicine

## 2018-06-04 DIAGNOSIS — M545 Low back pain: Secondary | ICD-10-CM | POA: Diagnosis present

## 2018-06-04 DIAGNOSIS — G8929 Other chronic pain: Secondary | ICD-10-CM | POA: Diagnosis present

## 2018-06-04 DIAGNOSIS — M6281 Muscle weakness (generalized): Secondary | ICD-10-CM | POA: Insufficient documentation

## 2018-06-04 DIAGNOSIS — M62830 Muscle spasm of back: Secondary | ICD-10-CM | POA: Diagnosis present

## 2018-06-04 NOTE — Therapy (Signed)
[facility] [address], [place], [address] Phone: [phone]   Fax:  [phone]  Physical Therapy Treatment  Patient Details  Name: [name] [name] MRN: [id number] Date of Birth: 17-Jun-1941 Referring Provider (PT): Amy Diona Browner   Encounter Date: 06/04/2018  PT End of Session - 06/04/18 1549    Visit Number  7    Number of Visits  13    Date for PT Re-Evaluation  06/24/18    Authorization Type  7 / 10 G Code    PT Start Time  1194    PT Stop Time  1600    PT Time Calculation (min)  45 min    Activity Tolerance  Patient tolerated treatment well    Behavior During Therapy  [facility] for tasks assessed/performed       Past Medical History:  Diagnosis Date  . Arthritis   . Chronic lower back pain   . Diverticulitis   . GERD (gastroesophageal reflux disease)   . Hemorrhoids, internal, with bleeding   . Hiatal hernia   . History of colon polyps   . HLD (hyperlipidemia)   . Hypertension   . OSA (obstructive sleep apnea)   . PUD (peptic ulcer disease)   . Scoliosis   . Thyroid disease    hypo  . Thyroid nodule   . Vaginitis, atrophic   . Vertigo     Past Surgical History:  Procedure Laterality Date  . ANKLE FUSION  1967   LEFT  . CHOLECYSTECTOMY     2004  . COLONOSCOPY  06/22/2011   diverticulosis (no adenomas since 2001)  . FRACTURE SURGERY Left 1967   foot and ankle surgery    There were no vitals filed for this visit.  Subjective Assessment - 06/04/18 1540    Subjective  Patient reports increased R knee pain along the superior aspect of her patella. Patient reports she feels she is getting stronger with performing the exercise.     Pertinent History  ankle fusion on the R side, lumbar scoliosis, menieres disease    Limitations  Lifting;Walking;Standing    How long can you walk comfortably?  Immediate pain    Patient Stated Goals  To decrease pain    Currently in Pain?  Yes    Pain Score   4     Pain Location  Back    Pain Orientation  Lower    Pain Descriptors / Indicators  Aching    Pain Onset  More than a month ago    Pain Frequency  Intermittent       TREATMENT Therapeutic Exercise: Seated ball roll outs - x 63min  Standing hip abduction with RTB around knees - x 20 Standing hip extension with RTB around knees -x 20  Standing Lumbar rotations with RTB - x 10  Side stepping across airex beam - x 5 Hamstring stretch in sitting - x 20  Paloff press with RTB - x 10  Squats with RTB around the knees - x 10    Patient demonstrates increased fatigue at the end of the session   PT Education - 06/04/18 1548    Education Details  form/technique with exercise    Person(s) Educated  Patient    Methods  Explanation;Demonstration    Comprehension  Verbalized understanding;Returned demonstration          PT Long Term Goals - 05/13/18 1743      PT LONG TERM GOAL #1  Title  Patient will be independent with exercise performance and progression to continue benefits of therapy after discharge    Baseline  form/technique with exercise    Time  6    Period  Weeks    Status  New    Target Date  06/24/18      PT LONG TERM GOAL #2   Title  Patient will improve 97mwt to over 58m/s to indicate a decrease in fall risk with performing community ambulation    Baseline  .71 m/s    Time  6    Period  Weeks    Status  New    Target Date  06/24/18      PT LONG TERM GOAL #3   Title  Patient will have a worst pain of 3/10 to indicate significant improvement and greater ability to perform standing with less pain allowing for performance of greater amount of ADLs.     Baseline  7/10 worst pain    Time  6    Period  Weeks    Status  New    Target Date  06/24/18            Plan - 06/04/18 1555    Clinical Impression Statement  Patient demonstrates improvement in knee pain after performing exercises indicating decreased pain and spasms. Patient demonstrates decreased pain  and spasms after performing exercises indicating decreased pain and spasms. Patient continues to demosntrate poor hip/lumbar strength and will benefit from further skilled therapy to return to prior level of function.     Rehab Potential  Good    Clinical Impairments Affecting Rehab Potential  (+) highly motivated, active lifestyle (-) chronicity, age    PT Frequency  2x / week    PT Duration  6 weeks    PT Treatment/Interventions  Gait training;Stair training;Functional mobility training;Therapeutic activities;Therapeutic exercise;Balance training;Electrical Stimulation;Cryotherapy;Iontophoresis 4mg /ml Dexamethasone;Moist Heat;Traction;Patient/family education;Manual techniques;Neuromuscular re-education;Passive range of motion;Dry needling    PT Next Visit Plan  progress exercise performance and technique    PT Home Exercise Plan  See education section(s)    Consulted and Agree with Plan of Care  Patient       Patient will benefit from skilled therapeutic intervention in order to improve the following deficits and impairments:  Decreased endurance, Decreased activity tolerance, Decreased balance, Pain, Decreased range of motion, Decreased strength, Difficulty walking, Abnormal gait, Increased fascial restricitons, Decreased coordination, Increased muscle spasms  Visit Diagnosis: Chronic right-sided low back pain without sciatica  Muscle weakness (generalized)  Muscle spasm of back     Problem List Patient Active Problem List   Diagnosis Date Noted  . Hyponatremia 01/08/2018  . Meniere's disease 01/08/2018  . 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 Lower extremity edema 08/09/2017  . Gait instability 08/09/2017  . Choking sensation 06/21/2017  . Inclusion cyst of vulva 05/14/2017  . Spasm of thoracic back muscle 05/11/2017  . Vulvar lesion 03/05/2017  . Situational anxiety 02/09/2017  . Puncture wound 01/05/2017  . Chronic insomnia 01/02/2017  . Counseling  regarding end of life decision making 12/18/2014  . Raynaud phenomenon 08/31/2014  . Chronic allergic conjunctivitis 03/15/2014  . PAD (peripheral artery disease) ([place]) 03/05/2013  . Chronic back pain greater than 3 months duration 08/13/2012  . S/P ankle fusion 02/06/2011  . TEMPOROMANDIBULAR JOINT PAIN 05/20/2010  . Carotid stenosis 06/30/2009  . Multiple thyroid nodules 03/15/2009  . Obstructive sleep apnea 03/05/2009  . HEMORRHOIDS, INTERNAL, WITH BLEEDING 05/09/2007  . VERTIGO 12/18/2006  .  COLONIC POLYPS 12/17/2006  . HYPERCHOLESTEROLEMIA 12/17/2006  . Essential hypertension 12/17/2006  . DIVERTICULAR DISEASE 12/17/2006  . VAGINITIS, ATROPHIC 12/17/2006  . Osteoarthritis, generalized 12/17/2006  . SCOLIOSIS 12/17/2006  . Seasonal and perennial allergic rhinitis 12/17/2006    Blythe Stanford, PT DPT 06/04/2018, 4:44 PM  [facility] [address], [place], [address] Phone: [phone]   Fax:  [phone]  Name: [name] [name] MRN: [id number] Date of Birth: 04/05/1941

## 2018-06-06 ENCOUNTER — Ambulatory Visit: Payer: Medicare Other

## 2018-06-10 ENCOUNTER — Ambulatory Visit: Payer: Medicare Other

## 2018-06-10 DIAGNOSIS — M545 Low back pain: Secondary | ICD-10-CM | POA: Diagnosis not present

## 2018-06-10 DIAGNOSIS — G8929 Other chronic pain: Secondary | ICD-10-CM

## 2018-06-10 DIAGNOSIS — M6281 Muscle weakness (generalized): Secondary | ICD-10-CM

## 2018-06-10 NOTE — Therapy (Signed)
[facility] [address], [place], [address] Phone: [phone]   Fax:  [phone]  Physical Therapy Treatment  Patient Details  Name: [name] [name] MRN: [id number] Date of Birth: 03-28-1941 Referring Provider (PT): Eliezer Lofts   Encounter Date: 06/10/2018  PT End of Session - 06/10/18 1717    Visit Number  8    Number of Visits  13    Date for PT Re-Evaluation  06/24/18    Authorization Type  8 / 10 G Code    PT Start Time  1430    PT Stop Time  1515    PT Time Calculation (min)  45 min    Activity Tolerance  Patient tolerated treatment well    Behavior During Therapy  [facility] for tasks assessed/performed       Past Medical History:  Diagnosis Date  . Arthritis   . Chronic lower back pain   . Diverticulitis   . GERD (gastroesophageal reflux disease)   . Hemorrhoids, internal, with bleeding   . Hiatal hernia   . History of colon polyps   . HLD (hyperlipidemia)   . Hypertension   . OSA (obstructive sleep apnea)   . PUD (peptic ulcer disease)   . Scoliosis   . Thyroid disease    hypo  . Thyroid nodule   . Vaginitis, atrophic   . Vertigo     Past Surgical History:  Procedure Laterality Date  . ANKLE FUSION  1967   LEFT  . CHOLECYSTECTOMY     2004  . COLONOSCOPY  06/22/2011   diverticulosis (no adenomas since 2001)  . FRACTURE SURGERY Left 1967   foot and ankle surgery    There were no vitals filed for this visit.  Subjective Assessment - 06/10/18 1438    Subjective  Patient reports no increase in pain since the previous session. Patient states no major changes since the previous session.     Pertinent History  ankle fusion on the R side, lumbar scoliosis, menieres disease    Limitations  Lifting;Walking;Standing    How long can you walk comfortably?  Immediate pain    Patient Stated Goals  To decrease pain    Currently in Pain?  Yes    Pain Score  5     Pain Location  Back    Pain  Orientation  Lower    Pain Descriptors / Indicators  Aching    Pain Type  Chronic pain    Pain Onset  More than a month ago    Pain Frequency  Intermittent       TREATMENT Therapeutic Exercise: Seated ball roll outs - x 73min  Standing hip abduction with YTB around knees - x 20 Standing hip extension with YTB around knees -x 20  Seated hip adductor with ball with glute squeeze - x 20  Paloff press with RTB - x 10  Squats with RTB around the knees - x 10  Side stepping across airex beam - x 5 with UE support  Manual Therapy STM performed to the R glute to decrease increased pain and spasms along the affected area with the patient positioned in sitting    Patient demonstrates increased fatigue at the end of the session  PT Education - 06/10/18 1716    Education Details  form/technique with exercise    Person(s) Educated  Patient    Methods  Explanation;Demonstration    Comprehension  Verbalized understanding;Returned demonstration  PT Long Term Goals - 05/13/18 1743      PT LONG TERM GOAL #1   Title  Patient will be independent with exercise performance and progression to continue benefits of therapy after discharge    Baseline  form/technique with exercise    Time  6    Period  Weeks    Status  New    Target Date  06/24/18      PT LONG TERM GOAL #2   Title  Patient will improve 60mwt to over 34m/s to indicate a decrease in fall risk with performing community ambulation    Baseline  .71 m/s    Time  6    Period  Weeks    Status  New    Target Date  06/24/18      PT LONG TERM GOAL #3   Title  Patient will have a worst pain of 3/10 to indicate significant improvement and greater ability to perform standing with less pain allowing for performance of greater amount of ADLs.     Baseline  7/10 worst pain    Time  6    Period  Weeks    Status  New    Target Date  06/24/18            Plan - 06/10/18 1718    Clinical Impression Statement  Patient  demonstrates increased pain with leaning forward and turning to an erect posture indicating poor motor control and increased muscle spasms along the multifidus and glute max musculature. Patient demosntrates decreased pain with performance of exercises indicating improvement in motor control with muscular recruitment and patient will benefit from further skilled therapy to return to prior level of function.     Rehab Potential  Good    Clinical Impairments Affecting Rehab Potential  (+) highly motivated, active lifestyle (-) chronicity, age    PT Frequency  2x / week    PT Duration  6 weeks    PT Treatment/Interventions  Gait training;Stair training;Functional mobility training;Therapeutic activities;Therapeutic exercise;Balance training;Electrical Stimulation;Cryotherapy;Iontophoresis 4mg /ml Dexamethasone;Moist Heat;Traction;Patient/family education;Manual techniques;Neuromuscular re-education;Passive range of motion;Dry needling    PT Next Visit Plan  progress exercise performance and technique    PT Home Exercise Plan  See education section(s)    Consulted and Agree with Plan of Care  Patient       Patient will benefit from skilled therapeutic intervention in order to improve the following deficits and impairments:  Decreased endurance, Decreased activity tolerance, Decreased balance, Pain, Decreased range of motion, Decreased strength, Difficulty walking, Abnormal gait, Increased fascial restricitons, Decreased coordination, Increased muscle spasms  Visit Diagnosis: Chronic right-sided low back pain without sciatica  Muscle weakness (generalized)     Problem List Patient Active Problem List   Diagnosis Date Noted  . Hyponatremia 01/08/2018  . Meniere's disease 01/08/2018  . 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 Lower extremity edema 08/09/2017  . Gait instability 08/09/2017  . Choking sensation 06/21/2017  . Inclusion cyst of vulva 05/14/2017  . Spasm of  thoracic back muscle 05/11/2017  . Vulvar lesion 03/05/2017  . Situational anxiety 02/09/2017  . Puncture wound 01/05/2017  . Chronic insomnia 01/02/2017  . Counseling regarding end of life decision making 12/18/2014  . Raynaud phenomenon 08/31/2014  . Chronic allergic conjunctivitis 03/15/2014  . PAD (peripheral artery disease) ([place]) 03/05/2013  . Chronic back pain greater than 3 months duration 08/13/2012  . S/P ankle fusion 02/06/2011  . TEMPOROMANDIBULAR JOINT PAIN 05/20/2010  . Carotid stenosis 06/30/2009  .  Multiple thyroid nodules 03/15/2009  . Obstructive sleep apnea 03/05/2009  . HEMORRHOIDS, INTERNAL, WITH BLEEDING 05/09/2007  . VERTIGO 12/18/2006  . COLONIC POLYPS 12/17/2006  . HYPERCHOLESTEROLEMIA 12/17/2006  . Essential hypertension 12/17/2006  . DIVERTICULAR DISEASE 12/17/2006  . VAGINITIS, ATROPHIC 12/17/2006  . Osteoarthritis, generalized 12/17/2006  . SCOLIOSIS 12/17/2006  . Seasonal and perennial allergic rhinitis 12/17/2006    Blythe Stanford, PT DPT 06/10/2018, 5:20 PM  [facility] [address] [place], [address] Phone: [phone]   Fax:  [phone]  Name: [name] [name] MRN: [id number] Date of Birth: 05-21-1941

## 2018-06-13 ENCOUNTER — Ambulatory Visit: Payer: Medicare Other

## 2018-06-13 ENCOUNTER — Other Ambulatory Visit: Payer: Self-pay | Admitting: Family Medicine

## 2018-06-13 DIAGNOSIS — M6281 Muscle weakness (generalized): Secondary | ICD-10-CM

## 2018-06-13 DIAGNOSIS — M62830 Muscle spasm of back: Secondary | ICD-10-CM

## 2018-06-13 DIAGNOSIS — M545 Low back pain, unspecified: Secondary | ICD-10-CM

## 2018-06-13 DIAGNOSIS — G8929 Other chronic pain: Secondary | ICD-10-CM

## 2018-06-13 NOTE — Therapy (Signed)
[facility] [address] [place], [address] Phone: [phone]   Fax:  [phone]  Physical Therapy Treatment  Patient Details  Name: [name] [name] MRN: [id number] Date of Birth: April 29, 1941 Referring Provider (PT): Amy Diona Browner   Encounter Date: 06/13/2018  PT End of Session - 06/13/18 1547    Visit Number  9    Number of Visits  13    Date for PT Re-Evaluation  06/24/18    Authorization Type  9 / 10 G Code    PT Start Time  6962    PT Stop Time  1500    PT Time Calculation (min)  45 min    Activity Tolerance  Patient tolerated treatment well    Behavior During Therapy  [facility] for tasks assessed/performed       Past Medical History:  Diagnosis Date  . Arthritis   . Chronic lower back pain   . Diverticulitis   . GERD (gastroesophageal reflux disease)   . Hemorrhoids, internal, with bleeding   . Hiatal hernia   . History of colon polyps   . HLD (hyperlipidemia)   . Hypertension   . OSA (obstructive sleep apnea)   . PUD (peptic ulcer disease)   . Scoliosis   . Thyroid disease    hypo  . Thyroid nodule   . Vaginitis, atrophic   . Vertigo     Past Surgical History:  Procedure Laterality Date  . ANKLE FUSION  1967   LEFT  . CHOLECYSTECTOMY     2004  . COLONOSCOPY  06/22/2011   diverticulosis (no adenomas since 2001)  . FRACTURE SURGERY Left 1967   foot and ankle surgery    There were no vitals filed for this visit.  Subjective Assessment - 06/13/18 1545    Subjective  Patient reports increased low back pain spasms along the bilateral aspect of her low back. Patient states she is improving and getting stronger.     Pertinent History  ankle fusion on the R side, lumbar scoliosis, menieres disease    Limitations  Lifting;Walking;Standing    How long can you walk comfortably?  Immediate pain    Patient Stated Goals  To decrease pain    Currently in Pain?  Yes    Pain Score  4     Pain  Location  Back    Pain Orientation  Lower    Pain Descriptors / Indicators  Aching    Pain Type  Chronic pain    Pain Onset  More than a month ago    Pain Frequency  Intermittent       TREATMENT Therapeutic Exercise Hip sliders forward, laterally, posteriorly with sliders - x 10 B (all three directions) Standing hip abduction at machine - x 20 25# Standing hip extension at machine - x 20 25# Standing hip squats - x 20  Standing straight UE lumbar ext - x 20  Paloff press with OMEGA 5# with therapist support - x 15 Bridges in hooklying - x 20  Hookyling ball lifts overhead - x 20    Patient demonstrates increased fatigue at the end of the session   PT Education - 06/13/18 1546    Education Details  form/technique with exercise    Person(s) Educated  Patient    Methods  Explanation;Demonstration    Comprehension  Verbalized understanding;Returned demonstration          PT Long Term Goals - 05/13/18 1743  PT LONG TERM GOAL #1   Title  Patient will be independent with exercise performance and progression to continue benefits of therapy after discharge    Baseline  form/technique with exercise    Time  6    Period  Weeks    Status  New    Target Date  06/24/18      PT LONG TERM GOAL #2   Title  Patient will improve 81mwt to over 25m/s to indicate a decrease in fall risk with performing community ambulation    Baseline  .71 m/s    Time  6    Period  Weeks    Status  New    Target Date  06/24/18      PT LONG TERM GOAL #3   Title  Patient will have a worst pain of 3/10 to indicate significant improvement and greater ability to perform standing with less pain allowing for performance of greater amount of ADLs.     Baseline  7/10 worst pain    Time  6    Period  Weeks    Status  New    Target Date  06/24/18            Plan - 06/13/18 1549    Clinical Impression Statement  Patient demonstrates decreased lumbar extension and compensates with performing  increased knee flexion to complete motion. Patient demonstrates poor core stability with performance of abdominal bracing most notably with reavching outside of BOS. Patient demonstrates improvement with performing resistive based exercise and will benefit from further skilled therapy to return to prior level of function.     Rehab Potential  Good    Clinical Impairments Affecting Rehab Potential  (+) highly motivated, active lifestyle (-) chronicity, age    PT Frequency  2x / week    PT Duration  6 weeks    PT Treatment/Interventions  Gait training;Stair training;Functional mobility training;Therapeutic activities;Therapeutic exercise;Balance training;Electrical Stimulation;Cryotherapy;Iontophoresis 4mg /ml Dexamethasone;Moist Heat;Traction;Patient/family education;Manual techniques;Neuromuscular re-education;Passive range of motion;Dry needling    PT Next Visit Plan  progress exercise performance and technique    PT Home Exercise Plan  See education section(s)    Consulted and Agree with Plan of Care  Patient       Patient will benefit from skilled therapeutic intervention in order to improve the following deficits and impairments:  Decreased endurance, Decreased activity tolerance, Decreased balance, Pain, Decreased range of motion, Decreased strength, Difficulty walking, Abnormal gait, Increased fascial restricitons, Decreased coordination, Increased muscle spasms  Visit Diagnosis: Chronic right-sided low back pain without sciatica  Muscle weakness (generalized)  Muscle spasm of back     Problem List Patient Active Problem List   Diagnosis Date Noted  . Hyponatremia 01/08/2018  . Meniere's disease 01/08/2018  . 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 Lower extremity edema 08/09/2017  . Gait instability 08/09/2017  . Choking sensation 06/21/2017  . Inclusion cyst of vulva 05/14/2017  . Spasm of thoracic back muscle 05/11/2017  . Vulvar lesion 03/05/2017  .  Situational anxiety 02/09/2017  . Puncture wound 01/05/2017  . Chronic insomnia 01/02/2017  . Counseling regarding end of life decision making 12/18/2014  . Raynaud phenomenon 08/31/2014  . Chronic allergic conjunctivitis 03/15/2014  . PAD (peripheral artery disease) ([place]) 03/05/2013  . Chronic back pain greater than 3 months duration 08/13/2012  . S/P ankle fusion 02/06/2011  . TEMPOROMANDIBULAR JOINT PAIN 05/20/2010  . Carotid stenosis 06/30/2009  . Multiple thyroid nodules 03/15/2009  . Obstructive sleep apnea 03/05/2009  .  HEMORRHOIDS, INTERNAL, WITH BLEEDING 05/09/2007  . VERTIGO 12/18/2006  . COLONIC POLYPS 12/17/2006  . HYPERCHOLESTEROLEMIA 12/17/2006  . Essential hypertension 12/17/2006  . DIVERTICULAR DISEASE 12/17/2006  . VAGINITIS, ATROPHIC 12/17/2006  . Osteoarthritis, generalized 12/17/2006  . SCOLIOSIS 12/17/2006  . Seasonal and perennial allergic rhinitis 12/17/2006    Blythe Stanford, PT DPT 06/13/2018, 5:52 PM  [facility] [address], [place], [address] Phone: [phone]   Fax:  [phone]  Name: Anniston Nellums MRN: [id number] Date of Birth: 09-Nov-1940

## 2018-06-14 NOTE — Telephone Encounter (Signed)
Last office visit 02/28/2018.  Next appt 07/12/2018 for follow up. Both meds last refilled 04/23/2018 for #60 with no refills.  Ok to refill?

## 2018-06-17 ENCOUNTER — Ambulatory Visit: Payer: Medicare Other

## 2018-06-17 DIAGNOSIS — M6281 Muscle weakness (generalized): Secondary | ICD-10-CM

## 2018-06-17 DIAGNOSIS — M545 Low back pain: Secondary | ICD-10-CM | POA: Diagnosis not present

## 2018-06-17 DIAGNOSIS — G8929 Other chronic pain: Secondary | ICD-10-CM

## 2018-06-17 DIAGNOSIS — M62830 Muscle spasm of back: Secondary | ICD-10-CM

## 2018-06-17 NOTE — Therapy (Signed)
[facility] [address] [place], [address] Phone: [phone]   Fax:  [phone]  Physical Therapy Treatment  Patient Details  Name: [name] [name] MRN: [id number] Date of Birth: 04-13-1941 Referring Provider (PT): Amy Diona Browner   Encounter Date: 06/17/2018  PT End of Session - 06/17/18 1521    Visit Number  10    Number of Visits  13    Date for PT Re-Evaluation  06/24/18    Authorization Type  10 / 10 G Code    PT Start Time  6712    PT Stop Time  1530    PT Time Calculation (min)  45 min    Activity Tolerance  Patient tolerated treatment well    Behavior During Therapy  [facility] for tasks assessed/performed       Past Medical History:  Diagnosis Date  . Arthritis   . Chronic lower back pain   . Diverticulitis   . GERD (gastroesophageal reflux disease)   . Hemorrhoids, internal, with bleeding   . Hiatal hernia   . History of colon polyps   . HLD (hyperlipidemia)   . Hypertension   . OSA (obstructive sleep apnea)   . PUD (peptic ulcer disease)   . Scoliosis   . Thyroid disease    hypo  . Thyroid nodule   . Vaginitis, atrophic   . Vertigo     Past Surgical History:  Procedure Laterality Date  . ANKLE FUSION  1967   LEFT  . CHOLECYSTECTOMY     2004  . COLONOSCOPY  06/22/2011   diverticulosis (no adenomas since 2001)  . FRACTURE SURGERY Left 1967   foot and ankle surgery    There were no vitals filed for this visit.  Subjective Assessment - 06/17/18 1502    Subjective  Patient reports she had busy today and states she has been improving overall.    Pertinent History  ankle fusion on the R side, lumbar scoliosis, menieres disease    Limitations  Lifting;Walking;Standing    How long can you walk comfortably?  Immediate pain    Patient Stated Goals  To decrease pain    Currently in Pain?  Yes    Pain Score  4     Pain Location  Back    Pain Orientation  Lower    Pain Descriptors /  Indicators  Aching    Pain Type  Chronic pain    Pain Onset  More than a month ago    Pain Frequency  Intermittent         TREATMENT  Therapeutic Exercise Standing hip squats - 2 x 10 Standing hip abduction - 2 x 10  Standing hip abduction at hip machine - x20 25# Standing hip extension at hip machine - x 20 25#  Backwards walking - 2 x 86ft  Hip sliders in abduction & extension - x 20 B  Lumbar/hip rotation in standing with GTB - x 20 B Nustep level 3 with seat level 9 - 5 min    Patient demonstrates increased fatigue at the end of the session   PT Education - 06/17/18 1512    Education Details  form/technique with exercise    Person(s) Educated  Patient    Methods  Explanation;Demonstration    Comprehension  Verbalized understanding;Returned demonstration          PT Long Term Goals - 06/17/18 1707      PT LONG TERM GOAL #1  Title  Patient will be independent with exercise performance and progression to continue benefits of therapy after discharge    Baseline  form/technique with exercise; 06/17/2018: Moderate cueing on form/technique    Time  6    Period  Weeks    Status  On-going      PT LONG TERM GOAL #2   Title  Patient will improve 50mwt to over 5m/s to indicate a decrease in fall risk with performing community ambulation    Baseline  .71 m/s; 06/17/2018: .56m/s    Time  6    Period  Weeks    Status  On-going      PT LONG TERM GOAL #3   Title  Patient will have a worst pain of 3/10 to indicate significant improvement and greater ability to perform standing with less pain allowing for performance of greater amount of ADLs.     Baseline  7/10 worst pain; 06/17/2018: 5/10 worst pain    Time  6    Period  Weeks    Status  On-going            Plan - 06/17/18 1535    Clinical Impression Statement  Patient is making progress towards long term goals with improvement with worst pain and 39mwt compared to the previous session indicating functional  carryover between sessions. Patient demonstrates poor strength, but is improving considerably with strength with exercise compared to previous sessions. Patient will benefit from further skilled therapy to return to prior level of function.      Rehab Potential  Good    Clinical Impairments Affecting Rehab Potential  (+) highly motivated, active lifestyle (-) chronicity, age    PT Frequency  2x / week    PT Duration  6 weeks    PT Treatment/Interventions  Gait training;Stair training;Functional mobility training;Therapeutic activities;Therapeutic exercise;Balance training;Electrical Stimulation;Cryotherapy;Iontophoresis 4mg /ml Dexamethasone;Moist Heat;Traction;Patient/family education;Manual techniques;Neuromuscular re-education;Passive range of motion;Dry needling    PT Next Visit Plan  progress exercise performance and technique    PT Home Exercise Plan  See education section(s)    Consulted and Agree with Plan of Care  Patient       Patient will benefit from skilled therapeutic intervention in order to improve the following deficits and impairments:  Decreased endurance, Decreased activity tolerance, Decreased balance, Pain, Decreased range of motion, Decreased strength, Difficulty walking, Abnormal gait, Increased fascial restricitons, Decreased coordination, Increased muscle spasms  Visit Diagnosis: Chronic right-sided low back pain without sciatica  Muscle weakness (generalized)  Muscle spasm of back     Problem List Patient Active Problem List   Diagnosis Date Noted  . Hyponatremia 01/08/2018  . Meniere's disease 01/08/2018  . 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 Lower extremity edema 08/09/2017  . Gait instability 08/09/2017  . Choking sensation 06/21/2017  . Inclusion cyst of vulva 05/14/2017  . Spasm of thoracic back muscle 05/11/2017  . Vulvar lesion 03/05/2017  . Situational anxiety 02/09/2017  . Puncture wound 01/05/2017  . Chronic insomnia  01/02/2017  . Counseling regarding end of life decision making 12/18/2014  . Raynaud phenomenon 08/31/2014  . Chronic allergic conjunctivitis 03/15/2014  . PAD (peripheral artery disease) ([place]) 03/05/2013  . Chronic back pain greater than 3 months duration 08/13/2012  . S/P ankle fusion 02/06/2011  . TEMPOROMANDIBULAR JOINT PAIN 05/20/2010  . Carotid stenosis 06/30/2009  . Multiple thyroid nodules 03/15/2009  . Obstructive sleep apnea 03/05/2009  . HEMORRHOIDS, INTERNAL, WITH BLEEDING 05/09/2007  . VERTIGO 12/18/2006  . COLONIC POLYPS  12/17/2006  . HYPERCHOLESTEROLEMIA 12/17/2006  . Essential hypertension 12/17/2006  . DIVERTICULAR DISEASE 12/17/2006  . VAGINITIS, ATROPHIC 12/17/2006  . Osteoarthritis, generalized 12/17/2006  . SCOLIOSIS 12/17/2006  . Seasonal and perennial allergic rhinitis 12/17/2006    Blythe Stanford, PT DPT 06/17/2018, 5:08 PM  [facility] [address], [place], [address] Phone: [phone]   Fax:  [phone]  Name: [name] [name] MRN: [id number] Date of Birth: 11/09/1940

## 2018-06-20 ENCOUNTER — Ambulatory Visit: Payer: Medicare Other

## 2018-06-20 DIAGNOSIS — M545 Low back pain, unspecified: Secondary | ICD-10-CM

## 2018-06-20 DIAGNOSIS — M6281 Muscle weakness (generalized): Secondary | ICD-10-CM

## 2018-06-20 DIAGNOSIS — G8929 Other chronic pain: Secondary | ICD-10-CM

## 2018-06-20 NOTE — Therapy (Signed)
[facility] [address] [place], [address] Phone: [phone]   Fax:  [phone]  Physical Therapy Treatment  Patient Details  Name: [name] [name] MRN: [id number] Date of Birth: 1941/02/13 Referring Provider (PT): Amy Diona Browner   Encounter Date: 06/20/2018  PT End of Session - 06/20/18 1352    Visit Number  11    Number of Visits  13    Date for PT Re-Evaluation  06/24/18    Authorization Type  10 / 10 G Code    PT Start Time  1300    PT Stop Time  8841    PT Time Calculation (min)  45 min    Activity Tolerance  Patient tolerated treatment well    Behavior During Therapy  [facility] for tasks assessed/performed       Past Medical History:  Diagnosis Date  . Arthritis   . Chronic lower back pain   . Diverticulitis   . GERD (gastroesophageal reflux disease)   . Hemorrhoids, internal, with bleeding   . Hiatal hernia   . History of colon polyps   . HLD (hyperlipidemia)   . Hypertension   . OSA (obstructive sleep apnea)   . PUD (peptic ulcer disease)   . Scoliosis   . Thyroid disease    hypo  . Thyroid nodule   . Vaginitis, atrophic   . Vertigo     Past Surgical History:  Procedure Laterality Date  . ANKLE FUSION  1967   LEFT  . CHOLECYSTECTOMY     2004  . COLONOSCOPY  06/22/2011   diverticulosis (no adenomas since 2001)  . FRACTURE SURGERY Left 1967   foot and ankle surgery    There were no vitals filed for this visit.  Subjective Assessment - 06/20/18 1308    Subjective  Pt reports being able to ambulate for longer distances (such as shopping) and complete household chores.     Pertinent History  ankle fusion on the R side, lumbar scoliosis, menieres disease    Limitations  Lifting;Walking;Standing    How long can you walk comfortably?  Immediate pain    Patient Stated Goals  To decrease pain    Currently in Pain?  Yes    Pain Score  3     Pain Location  Back    Pain Orientation   Lower    Pain Descriptors / Indicators  Aching    Pain Type  Chronic pain    Pain Onset  More than a month ago    Pain Frequency  Intermittent       TREATMENT  Therapeutic Exercise Standing squats - 2 x 10 Standing hip abduction at hip machine - x20 25# Standing hip extension at hip machine - x 20 25#  Backwards walking - 3 x 24ft  Hip sliders in abduction & extension - x 20 B  Lumbar/hip rotation in standing with GTB - x 20 B Salsa side stepping with black tubing held in front Nustep level 3 with seat level 9 - 5 min    Patient demonstrates increased fatigue at the end of the session   PT Education - 06/20/18 1319    Person(s) Educated  Patient    Methods  Explanation;Demonstration    Comprehension  Verbalized understanding;Returned demonstration          PT Long Term Goals - 06/17/18 1707      PT LONG TERM GOAL #1   Title  Patient will be  independent with exercise performance and progression to continue benefits of therapy after discharge    Baseline  form/technique with exercise; 06/17/2018: Moderate cueing on form/technique    Time  6    Period  Weeks    Status  On-going      PT LONG TERM GOAL #2   Title  Patient will improve 50mwt to over 20m/s to indicate a decrease in fall risk with performing community ambulation    Baseline  .71 m/s; 06/17/2018: .58m/s    Time  6    Period  Weeks    Status  On-going      PT LONG TERM GOAL #3   Title  Patient will have a worst pain of 3/10 to indicate significant improvement and greater ability to perform standing with less pain allowing for performance of greater amount of ADLs.     Baseline  7/10 worst pain; 06/17/2018: 5/10 worst pain    Time  6    Period  Weeks    Status  On-going            Plan - 06/20/18 1353    Clinical Impression Statement  Pt is making progress towards pain reduction and functional goals. Pt is now able to complete shopping trips and chores without pain stopping her. Pt demo LE weakness,  balance deficiency and increased fatigue. Pt will benefit from skilled therapy to increase strength, endurance and balance in order to return to PLOF.     Rehab Potential  Good    Clinical Impairments Affecting Rehab Potential  (+) highly motivated, active lifestyle (-) chronicity, age    PT Frequency  2x / week    PT Duration  6 weeks    PT Treatment/Interventions  Gait training;Stair training;Functional mobility training;Therapeutic activities;Therapeutic exercise;Balance training;Electrical Stimulation;Cryotherapy;Iontophoresis 4mg /ml Dexamethasone;Moist Heat;Traction;Patient/family education;Manual techniques;Neuromuscular re-education;Passive range of motion;Dry needling    PT Next Visit Plan  progress exercise performance and technique    PT Home Exercise Plan  See education section(s)    Consulted and Agree with Plan of Care  Patient       Patient will benefit from skilled therapeutic intervention in order to improve the following deficits and impairments:  Decreased endurance, Decreased activity tolerance, Decreased balance, Pain, Decreased range of motion, Decreased strength, Difficulty walking, Abnormal gait, Increased fascial restricitons, Decreased coordination, Increased muscle spasms  Visit Diagnosis: Chronic right-sided low back pain without sciatica  Muscle weakness (generalized)     Problem List Patient Active Problem List   Diagnosis Date Noted  . Hyponatremia 01/08/2018  . Meniere's disease 01/08/2018  . 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 Lower extremity edema 08/09/2017  . Gait instability 08/09/2017  . Choking sensation 06/21/2017  . Inclusion cyst of vulva 05/14/2017  . Spasm of thoracic back muscle 05/11/2017  . Vulvar lesion 03/05/2017  . Situational anxiety 02/09/2017  . Puncture wound 01/05/2017  . Chronic insomnia 01/02/2017  . Counseling regarding end of life decision making 12/18/2014  . Raynaud phenomenon 08/31/2014  .  Chronic allergic conjunctivitis 03/15/2014  . PAD (peripheral artery disease) ([place]) 03/05/2013  . Chronic back pain greater than 3 months duration 08/13/2012  . S/P ankle fusion 02/06/2011  . TEMPOROMANDIBULAR JOINT PAIN 05/20/2010  . Carotid stenosis 06/30/2009  . Multiple thyroid nodules 03/15/2009  . Obstructive sleep apnea 03/05/2009  . HEMORRHOIDS, INTERNAL, WITH BLEEDING 05/09/2007  . VERTIGO 12/18/2006  . COLONIC POLYPS 12/17/2006  . HYPERCHOLESTEROLEMIA 12/17/2006  . Essential hypertension 12/17/2006  . DIVERTICULAR DISEASE 12/17/2006  .  VAGINITIS, ATROPHIC 12/17/2006  . Osteoarthritis, generalized 12/17/2006  . SCOLIOSIS 12/17/2006  . Seasonal and perennial allergic rhinitis 12/17/2006   Karilyn Cota SPT  Blythe Stanford PT DPT 06/20/2018, 1:58 PM  [place] [facility] [address], [place], [address] Phone: [phone]   Fax:  [phone]  Name: Ellery Tash MRN: [id number] Date of Birth: 03-29-1941

## 2018-06-24 ENCOUNTER — Other Ambulatory Visit: Payer: Self-pay | Admitting: Family Medicine

## 2018-06-25 ENCOUNTER — Ambulatory Visit: Payer: Medicare Other

## 2018-06-25 DIAGNOSIS — M6281 Muscle weakness (generalized): Secondary | ICD-10-CM

## 2018-06-25 DIAGNOSIS — M545 Low back pain: Secondary | ICD-10-CM | POA: Diagnosis not present

## 2018-06-25 DIAGNOSIS — M62830 Muscle spasm of back: Secondary | ICD-10-CM

## 2018-06-25 DIAGNOSIS — G8929 Other chronic pain: Secondary | ICD-10-CM

## 2018-06-25 NOTE — Therapy (Signed)
[facility] [address] [place], [address] Phone: [phone]   Fax:  [phone]  Physical Therapy Treatment  Patient Details  Name: [name] [name] MRN: [id number] Date of Birth: Feb 28, 1941 Referring Provider (PT): Amy Diona Browner   Encounter Date: 06/25/2018  PT End of Session - 06/25/18 1446    Visit Number  12    Number of Visits  21    Date for PT Re-Evaluation  07/30/18    Authorization Type  1/ 10 G Code    PT Start Time  7867    PT Stop Time  1515    PT Time Calculation (min)  40 min    Activity Tolerance  Patient tolerated treatment well    Behavior During Therapy  [facility] for tasks assessed/performed       Past Medical History:  Diagnosis Date  . Arthritis   . Chronic lower back pain   . Diverticulitis   . GERD (gastroesophageal reflux disease)   . Hemorrhoids, internal, with bleeding   . Hiatal hernia   . History of colon polyps   . HLD (hyperlipidemia)   . Hypertension   . OSA (obstructive sleep apnea)   . PUD (peptic ulcer disease)   . Scoliosis   . Thyroid disease    hypo  . Thyroid nodule   . Vaginitis, atrophic   . Vertigo     Past Surgical History:  Procedure Laterality Date  . ANKLE FUSION  1967   LEFT  . CHOLECYSTECTOMY     2004  . COLONOSCOPY  06/22/2011   diverticulosis (no adenomas since 2001)  . FRACTURE SURGERY Left 1967   foot and ankle surgery    There were no vitals filed for this visit.  Subjective Assessment - 06/25/18 1444    Subjective  Patient reports her back is in incrased pain today secondary to the rain. Patient states the pain has been increased recently.    Pertinent History  ankle fusion on the R side, lumbar scoliosis, menieres disease    Limitations  Lifting;Walking;Standing    How long can you walk comfortably?  Immediate pain    Patient Stated Goals  To decrease pain    Currently in Pain?  Yes    Pain Score  4     Pain Location  Back    Pain Orientation  Lower    Pain Descriptors / Indicators  Aching    Pain Type  Chronic pain    Pain Onset  More than a month ago    Pain Frequency  Intermittent       TREATMENT  Therapeutic Exercise Standing squats - 2 x 10 Standing hip abduction at hip machine - x20 25# Standing hip extension at hip machine - x 20 25#  Lumbar/hip rotation in standing for rotation- x 20 B Seated pelvic tilts - x 20  Standing paloff press - x 20 with black tubing Low row in standing with GTB - x 20  Side stepping up and down airex pad - x 20  Nustep level 4 with seat level 9 - x5 min    Patient demonstrates increased fatigue at the end of the session  PT Education - 06/25/18 1445    Education Details  form/technique with exercise    Person(s) Educated  Patient    Methods  Explanation;Demonstration    Comprehension  Verbalized understanding;Returned demonstration          PT Long Term Goals -  06/17/18 1707      PT LONG TERM GOAL #1   Title  Patient will be independent with exercise performance and progression to continue benefits of therapy after discharge    Baseline  form/technique with exercise; 06/17/2018: Moderate cueing on form/technique    Time  6    Period  Weeks    Status  On-going      PT LONG TERM GOAL #2   Title  Patient will improve 43mwt to over 42m/s to indicate a decrease in fall risk with performing community ambulation    Baseline  .71 m/s; 06/17/2018: .40m/s    Time  6    Period  Weeks    Status  On-going      PT LONG TERM GOAL #3   Title  Patient will have a worst pain of 3/10 to indicate significant improvement and greater ability to perform standing with less pain allowing for performance of greater amount of ADLs.     Baseline  7/10 worst pain; 06/17/2018: 5/10 worst pain    Time  6    Period  Weeks    Status  On-going            Plan - 06/25/18 1507    Clinical Impression Statement  Patient demonstrates decreased pain at the end of the session  indicating improvement in muscula spasms. Patient demonstrates ability to perform greater amount of exercises compared to the previous sessions indicating functional carryover between sessions. Patient continues to demosntrate increased weakness with stabilization and will benefit from further skilled therapy to return to prior level of function.     Rehab Potential  Good    Clinical Impairments Affecting Rehab Potential  (+) highly motivated, active lifestyle (-) chronicity, age    PT Frequency  2x / week    PT Duration  6 weeks    PT Treatment/Interventions  Gait training;Stair training;Functional mobility training;Therapeutic activities;Therapeutic exercise;Balance training;Electrical Stimulation;Cryotherapy;Iontophoresis 4mg /ml Dexamethasone;Moist Heat;Traction;Patient/family education;Manual techniques;Neuromuscular re-education;Passive range of motion;Dry needling    PT Next Visit Plan  progress exercise performance and technique    PT Home Exercise Plan  See education section(s)    Consulted and Agree with Plan of Care  Patient       Patient will benefit from skilled therapeutic intervention in order to improve the following deficits and impairments:  Decreased endurance, Decreased activity tolerance, Decreased balance, Pain, Decreased range of motion, Decreased strength, Difficulty walking, Abnormal gait, Increased fascial restricitons, Decreased coordination, Increased muscle spasms  Visit Diagnosis: Chronic right-sided low back pain without sciatica  Muscle weakness (generalized)  Muscle spasm of back     Problem List Patient Active Problem List   Diagnosis Date Noted  . Hyponatremia 01/08/2018  . Meniere's disease 01/08/2018  . 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 Lower extremity edema 08/09/2017  . Gait instability 08/09/2017  . Choking sensation 06/21/2017  . Inclusion cyst of vulva 05/14/2017  . Spasm of thoracic back muscle 05/11/2017  . Vulvar  lesion 03/05/2017  . Situational anxiety 02/09/2017  . Puncture wound 01/05/2017  . Chronic insomnia 01/02/2017  . Counseling regarding end of life decision making 12/18/2014  . Raynaud phenomenon 08/31/2014  . Chronic allergic conjunctivitis 03/15/2014  . PAD (peripheral artery disease) ([place]) 03/05/2013  . Chronic back pain greater than 3 months duration 08/13/2012  . S/P ankle fusion 02/06/2011  . TEMPOROMANDIBULAR JOINT PAIN 05/20/2010  . Carotid stenosis 06/30/2009  . Multiple thyroid nodules 03/15/2009  . Obstructive sleep apnea 03/05/2009  .  HEMORRHOIDS, INTERNAL, WITH BLEEDING 05/09/2007  . VERTIGO 12/18/2006  . COLONIC POLYPS 12/17/2006  . HYPERCHOLESTEROLEMIA 12/17/2006  . Essential hypertension 12/17/2006  . DIVERTICULAR DISEASE 12/17/2006  . VAGINITIS, ATROPHIC 12/17/2006  . Osteoarthritis, generalized 12/17/2006  . SCOLIOSIS 12/17/2006  . Seasonal and perennial allergic rhinitis 12/17/2006    Blythe Stanford, PT DPT 06/25/2018, 3:13 PM  [facility] [address], [place], [address] Phone: [phone]   Fax:  [phone]  Name: Shallon Yaklin MRN: [id number] Date of Birth: 07-14-1941

## 2018-07-02 ENCOUNTER — Ambulatory Visit: Payer: Medicare Other

## 2018-07-02 DIAGNOSIS — M545 Low back pain, unspecified: Secondary | ICD-10-CM

## 2018-07-02 DIAGNOSIS — G8929 Other chronic pain: Secondary | ICD-10-CM

## 2018-07-02 DIAGNOSIS — M6281 Muscle weakness (generalized): Secondary | ICD-10-CM

## 2018-07-02 DIAGNOSIS — M62830 Muscle spasm of back: Secondary | ICD-10-CM

## 2018-07-02 NOTE — Therapy (Signed)
[facility] [address], [place], [address] Phone: [phone]   Fax:  [phone]  Physical Therapy Treatment  Patient Details  Name: [name] [name] MRN: [id number] Date of Birth: [birth year] Referring Provider (PT): Amy Diona Browner   Encounter Date: 07/02/2018  PT End of Session - 07/02/18 1139    Visit Number  13    Number of Visits  21    Date for PT Re-Evaluation  07/30/18    Authorization Type  2/ 10 G Code    PT Start Time  1115    PT Stop Time  1200    PT Time Calculation (min)  45 min    Activity Tolerance  Patient tolerated treatment well    Behavior During Therapy  [facility] for tasks assessed/performed       Past Medical History:  Diagnosis Date  . Arthritis   . Chronic lower back pain   . Diverticulitis   . GERD (gastroesophageal reflux disease)   . Hemorrhoids, internal, with bleeding   . Hiatal hernia   . History of colon polyps   . HLD (hyperlipidemia)   . Hypertension   . OSA (obstructive sleep apnea)   . PUD (peptic ulcer disease)   . Scoliosis   . Thyroid disease    hypo  . Thyroid nodule   . Vaginitis, atrophic   . Vertigo     Past Surgical History:  Procedure Laterality Date  . ANKLE FUSION  1967   LEFT  . CHOLECYSTECTOMY     2004  . COLONOSCOPY  06/22/2011   diverticulosis (no adenomas since 2001)  . FRACTURE SURGERY Left 1967   foot and ankle surgery    There were no vitals filed for this visit.  Subjective Assessment - 07/02/18 1136    Subjective  Patient reports increased back pain along her L and R aspct of her low back. Patient states its from standing for prolonged periods of time.     Pertinent History  ankle fusion on the R side, lumbar scoliosis, menieres disease    Limitations  Lifting;Walking;Standing    How long can you walk comfortably?  Immediate pain    Patient Stated Goals  To decrease pain    Currently in Pain?  Yes    Pain Score  6     Pain  Location  Back    Pain Orientation  Lower    Pain Descriptors / Indicators  Aching    Pain Type  Chronic pain    Pain Onset  More than a month ago    Pain Frequency  Intermittent        TREATMENT  Therapeutic Exercise Seated prayer stretch - x 3 min Seated pelvic tilts - x 20  Standing squats - 2 x 10 Standing hip abduction at hip machine - 2x20 25# Standing hip extension at hip machine - x 20 25#  Lumbar/hip rotation in standing for rotation- x 20 B RTB Standing with 4# ball performing ball circles - x 20   Manual therapy STM performed to the multifidus and glute max on the L side to decrease increased pain and spasms with patient positioned in sitting    Patient demonstrates increased fatigue at the end of the session     PT Education - 07/02/18 1139    Education Details  form/technique with exercise    Person(s) Educated  Patient    Methods  Explanation;Demonstration    Comprehension  Verbalized understanding;Returned  demonstration          PT Long Term Goals - 06/17/18 1707      PT LONG TERM GOAL #1   Title  Patient will be independent with exercise performance and progression to continue benefits of therapy after discharge    Baseline  form/technique with exercise; 06/17/2018: Moderate cueing on form/technique    Time  6    Period  Weeks    Status  On-going      PT LONG TERM GOAL #2   Title  Patient will improve 78mwt to over 9m/s to indicate a decrease in fall risk with performing community ambulation    Baseline  .71 m/s; 06/17/2018: .66m/s    Time  6    Period  Weeks    Status  On-going      PT LONG TERM GOAL #3   Title  Patient will have a worst pain of 3/10 to indicate significant improvement and greater ability to perform standing with less pain allowing for performance of greater amount of ADLs.     Baseline  7/10 worst pain; 06/17/2018: 5/10 worst pain    Time  6    Period  Weeks    Status  On-going            Plan - 07/02/18 1301     Clinical Impression Statement  Patient demonstrates improvement in pain after performing exercises most notably exercises which address hip abduction weakness indcating poor motor control and recruitment of hip stabilization musculature with movement. Patient continues to have increased pain along her R and L multifidus overall. Patient demonstrates mobility limitations most notably in the extension direction. Patient will benefit from further skilled therapy to return to prior level of function.     Rehab Potential  Good    Clinical Impairments Affecting Rehab Potential  (+) highly motivated, active lifestyle (-) chronicity, age    PT Frequency  2x / week    PT Duration  6 weeks    PT Treatment/Interventions  Gait training;Stair training;Functional mobility training;Therapeutic activities;Therapeutic exercise;Balance training;Electrical Stimulation;Cryotherapy;Iontophoresis 4mg /ml Dexamethasone;Moist Heat;Traction;Patient/family education;Manual techniques;Neuromuscular re-education;Passive range of motion;Dry needling    PT Next Visit Plan  progress exercise performance and technique    PT Home Exercise Plan  See education section(s)    Consulted and Agree with Plan of Care  Patient       Patient will benefit from skilled therapeutic intervention in order to improve the following deficits and impairments:  Decreased endurance, Decreased activity tolerance, Decreased balance, Pain, Decreased range of motion, Decreased strength, Difficulty walking, Abnormal gait, Increased fascial restricitons, Decreased coordination, Increased muscle spasms  Visit Diagnosis: Chronic right-sided low back pain without sciatica  Muscle weakness (generalized)  Muscle spasm of back     Problem List Patient Active Problem List   Diagnosis Date Noted  . Hyponatremia 01/08/2018  . Meniere's disease 01/08/2018  . 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 Lower extremity edema 08/09/2017  . Gait  instability 08/09/2017  . Choking sensation 06/21/2017  . Inclusion cyst of vulva 05/14/2017  . Spasm of thoracic back muscle 05/11/2017  . Vulvar lesion 03/05/2017  . Situational anxiety 02/09/2017  . Puncture wound 01/05/2017  . Chronic insomnia 01/02/2017  . Counseling regarding end of life decision making 12/18/2014  . Raynaud phenomenon 08/31/2014  . Chronic allergic conjunctivitis 03/15/2014  . PAD (peripheral artery disease) ([place]) 03/05/2013  . Chronic back pain greater than 3 months duration 08/13/2012  . S/P ankle fusion 02/06/2011  .  TEMPOROMANDIBULAR JOINT PAIN 05/20/2010  . Carotid stenosis 06/30/2009  . Multiple thyroid nodules 03/15/2009  . Obstructive sleep apnea 03/05/2009  . HEMORRHOIDS, INTERNAL, WITH BLEEDING 05/09/2007  . VERTIGO 12/18/2006  . COLONIC POLYPS 12/17/2006  . HYPERCHOLESTEROLEMIA 12/17/2006  . Essential hypertension 12/17/2006  . DIVERTICULAR DISEASE 12/17/2006  . VAGINITIS, ATROPHIC 12/17/2006  . Osteoarthritis, generalized 12/17/2006  . SCOLIOSIS 12/17/2006  . Seasonal and perennial allergic rhinitis 12/17/2006    Blythe Stanford, PT DPT 07/02/2018, 1:03 PM  [facility] [address], [place], [address] Phone: [phone]   Fax:  [phone]  Name: [name] [name] MRN: [id number] Date of Birth: [birth year]

## 2018-07-05 ENCOUNTER — Telehealth: Payer: Self-pay | Admitting: Family Medicine

## 2018-07-05 DIAGNOSIS — E7800 Pure hypercholesterolemia, unspecified: Secondary | ICD-10-CM

## 2018-07-05 NOTE — Telephone Encounter (Signed)
-----   Message from Ellamae Sia sent at 07/01/2018  2:27 PM EDT ----- Regarding: Lab orders for Monday, 11.4.19 Lab orders for f/u

## 2018-07-08 ENCOUNTER — Other Ambulatory Visit (INDEPENDENT_AMBULATORY_CARE_PROVIDER_SITE_OTHER): Payer: Medicare Other

## 2018-07-08 ENCOUNTER — Ambulatory Visit: Payer: Medicare Other | Admitting: Family Medicine

## 2018-07-08 ENCOUNTER — Ambulatory Visit: Payer: Medicare Other | Attending: Family Medicine

## 2018-07-08 DIAGNOSIS — M62830 Muscle spasm of back: Secondary | ICD-10-CM | POA: Diagnosis present

## 2018-07-08 DIAGNOSIS — M6281 Muscle weakness (generalized): Secondary | ICD-10-CM | POA: Diagnosis present

## 2018-07-08 DIAGNOSIS — M545 Low back pain: Secondary | ICD-10-CM | POA: Insufficient documentation

## 2018-07-08 DIAGNOSIS — E7800 Pure hypercholesterolemia, unspecified: Secondary | ICD-10-CM

## 2018-07-08 DIAGNOSIS — G8929 Other chronic pain: Secondary | ICD-10-CM | POA: Diagnosis present

## 2018-07-08 LAB — LIPID PANEL
Cholesterol: 164 mg/dL (ref 0–200)
HDL: 61.8 mg/dL
LDL Cholesterol: 73 mg/dL (ref 0–99)
NonHDL: 102.44
Total CHOL/HDL Ratio: 3
Triglycerides: 146 mg/dL (ref 0.0–149.0)
VLDL: 29.2 mg/dL (ref 0.0–40.0)

## 2018-07-08 LAB — COMPREHENSIVE METABOLIC PANEL WITH GFR
ALT: 28 U/L (ref 0–35)
AST: 19 U/L (ref 0–37)
Albumin: 4.6 g/dL (ref 3.5–5.2)
Alkaline Phosphatase: 100 U/L (ref 39–117)
BUN: 20 mg/dL (ref 6–23)
CO2: 27 meq/L (ref 19–32)
Calcium: 9.5 mg/dL (ref 8.4–10.5)
Chloride: 94 meq/L — ABNORMAL LOW (ref 96–112)
Creatinine, Ser: 0.77 mg/dL (ref 0.40–1.20)
GFR: 76.88 mL/min
Glucose, Bld: 94 mg/dL (ref 70–99)
Potassium: 4.1 meq/L (ref 3.5–5.1)
Sodium: 129 meq/L — ABNORMAL LOW (ref 135–145)
Total Bilirubin: 0.6 mg/dL (ref 0.2–1.2)
Total Protein: 7.2 g/dL (ref 6.0–8.3)

## 2018-07-08 NOTE — Therapy (Signed)
[facility] [address], [place], [address] Phone: [phone]   Fax:  [phone]  Physical Therapy Treatment  Patient Details  Name: [name] [name] MRN: [id number] Date of Birth: 1941/02/18 Referring Provider (PT): Eliezer Lofts   Encounter Date: 07/08/2018  PT End of Session - 07/08/18 1509    Visit Number  14    Number of Visits  21    Date for PT Re-Evaluation  07/30/18    Authorization Type  4/ 10 G Code    PT Start Time  1430    PT Stop Time  1515    PT Time Calculation (min)  45 min    Activity Tolerance  Patient tolerated treatment well    Behavior During Therapy  [facility] for tasks assessed/performed       Past Medical History:  Diagnosis Date  . Arthritis   . Chronic lower back pain   . Diverticulitis   . GERD (gastroesophageal reflux disease)   . Hemorrhoids, internal, with bleeding   . Hiatal hernia   . History of colon polyps   . HLD (hyperlipidemia)   . Hypertension   . OSA (obstructive sleep apnea)   . PUD (peptic ulcer disease)   . Scoliosis   . Thyroid disease    hypo  . Thyroid nodule   . Vaginitis, atrophic   . Vertigo     Past Surgical History:  Procedure Laterality Date  . ANKLE FUSION  1967   LEFT  . CHOLECYSTECTOMY     2004  . COLONOSCOPY  06/22/2011   diverticulosis (no adenomas since 2001)  . FRACTURE SURGERY Left 1967   foot and ankle surgery    There were no vitals filed for this visit.  Subjective Assessment - 07/08/18 1442    Subjective  Patient reports no increase in pain today. Patient states no increase in pain after the previous session.     Pertinent History  ankle fusion on the R side, lumbar scoliosis, menieres disease    Limitations  Lifting;Walking;Standing    How long can you walk comfortably?  Immediate pain    Patient Stated Goals  To decrease pain    Currently in Pain?  No/denies    Pain Onset  More than a month ago         TREATMENT   Therapeutic Exercise Standing squats - 2 x 10 Standing hip abduction at hip machine - x20 RTB around ankles Standing hip extension at hip machine - x 20 RTB around ankles Side stepping up and over 6" step - x 20  Downward chops with Black tubing - x 20 Feet together ball throws standing on airex pad - 2 x 10  Nustep level 3 with seat level 9 - x8 min Side stepping with arms straight - 2 x 67ft Backwards walking with holding 3# weights (2) - 2 x 13ft   Patient demonstrates increased fatigue at the end of the session  PT Education - 07/08/18 1444    Education Details  form/techniqe with exercise    Person(s) Educated  Patient    Methods  Explanation;Demonstration    Comprehension  Verbalized understanding;Returned demonstration          PT Long Term Goals - 06/17/18 1707      PT LONG TERM GOAL #1   Title  Patient will be independent with exercise performance and progression to continue benefits of therapy after discharge    Baseline  form/technique  with exercise; 06/17/2018: Moderate cueing on form/technique    Time  6    Period  Weeks    Status  On-going      PT LONG TERM GOAL #2   Title  Patient will improve 48mwt to over 45m/s to indicate a decrease in fall risk with performing community ambulation    Baseline  .71 m/s; 06/17/2018: .5m/s    Time  6    Period  Weeks    Status  On-going      PT LONG TERM GOAL #3   Title  Patient will have a worst pain of 3/10 to indicate significant improvement and greater ability to perform standing with less pain allowing for performance of greater amount of ADLs.     Baseline  7/10 worst pain; 06/17/2018: 5/10 worst pain    Time  6    Period  Weeks    Status  On-going            Plan - 07/08/18 1520    Clinical Impression Statement  Patient demonstrates no increase in pain at the end of the session and is able to perform greater amount of exercises compared to the previous sessions. Patient demonstrates improvement with  ability to perform greater standing exercises compared to previous sessions however to have increased difficulty to exercises and will benefit from further skilled therapy to return to prior level of function.      Rehab Potential  Good    Clinical Impairments Affecting Rehab Potential  (+) highly motivated, active lifestyle (-) chronicity, age    PT Frequency  2x / week    PT Duration  6 weeks    PT Treatment/Interventions  Gait training;Stair training;Functional mobility training;Therapeutic activities;Therapeutic exercise;Balance training;Electrical Stimulation;Cryotherapy;Iontophoresis 4mg /ml Dexamethasone;Moist Heat;Traction;Patient/family education;Manual techniques;Neuromuscular re-education;Passive range of motion;Dry needling    PT Next Visit Plan  progress exercise performance and technique    PT Home Exercise Plan  See education section(s)    Consulted and Agree with Plan of Care  Patient       Patient will benefit from skilled therapeutic intervention in order to improve the following deficits and impairments:  Decreased endurance, Decreased activity tolerance, Decreased balance, Pain, Decreased range of motion, Decreased strength, Difficulty walking, Abnormal gait, Increased fascial restricitons, Decreased coordination, Increased muscle spasms  Visit Diagnosis: Chronic right-sided low back pain without sciatica  Muscle weakness (generalized)  Muscle spasm of back     Problem List Patient Active Problem List   Diagnosis Date Noted  . Hyponatremia 01/08/2018  . Meniere's disease 01/08/2018  . 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 Lower extremity edema 08/09/2017  . Gait instability 08/09/2017  . Choking sensation 06/21/2017  . Inclusion cyst of vulva 05/14/2017  . Spasm of thoracic back muscle 05/11/2017  . Vulvar lesion 03/05/2017  . Situational anxiety 02/09/2017  . Puncture wound 01/05/2017  . Chronic insomnia 01/02/2017  . Counseling regarding  end of life decision making 12/18/2014  . Raynaud phenomenon 08/31/2014  . Chronic allergic conjunctivitis 03/15/2014  . PAD (peripheral artery disease) ([place]) 03/05/2013  . Chronic back pain greater than 3 months duration 08/13/2012  . S/P ankle fusion 02/06/2011  . TEMPOROMANDIBULAR JOINT PAIN 05/20/2010  . Carotid stenosis 06/30/2009  . Multiple thyroid nodules 03/15/2009  . Obstructive sleep apnea 03/05/2009  . HEMORRHOIDS, INTERNAL, WITH BLEEDING 05/09/2007  . VERTIGO 12/18/2006  . COLONIC POLYPS 12/17/2006  . HYPERCHOLESTEROLEMIA 12/17/2006  . Essential hypertension 12/17/2006  . DIVERTICULAR DISEASE 12/17/2006  . VAGINITIS,  ATROPHIC 12/17/2006  . Osteoarthritis, generalized 12/17/2006  . SCOLIOSIS 12/17/2006  . Seasonal and perennial allergic rhinitis 12/17/2006    Blythe Stanford, PT DPT 07/08/2018, 3:26 PM  [facility] [address] [place], [address] Phone: [phone]   Fax:  [phone]  Name: [name] [name] MRN: [id number] Date of Birth: 1940-11-12

## 2018-07-08 NOTE — Addendum Note (Signed)
Addended by: Carter Kitten on: 07/08/2018 02:55 PM   Modules accepted: Orders

## 2018-07-10 ENCOUNTER — Ambulatory Visit (INDEPENDENT_AMBULATORY_CARE_PROVIDER_SITE_OTHER): Payer: Medicare Other | Admitting: Podiatry

## 2018-07-10 ENCOUNTER — Encounter: Payer: Self-pay | Admitting: Podiatry

## 2018-07-10 ENCOUNTER — Ambulatory Visit: Payer: Medicare Other

## 2018-07-10 VITALS — BP 163/81 | HR 87

## 2018-07-10 DIAGNOSIS — M6281 Muscle weakness (generalized): Secondary | ICD-10-CM

## 2018-07-10 DIAGNOSIS — M545 Low back pain: Secondary | ICD-10-CM | POA: Diagnosis not present

## 2018-07-10 DIAGNOSIS — B351 Tinea unguium: Secondary | ICD-10-CM

## 2018-07-10 DIAGNOSIS — M79676 Pain in unspecified toe(s): Secondary | ICD-10-CM

## 2018-07-10 DIAGNOSIS — G8929 Other chronic pain: Secondary | ICD-10-CM

## 2018-07-10 NOTE — Progress Notes (Signed)
She presents today chief complaint of painful elongated toenails.  Objective: Toenails are long thick yellow dystrophic-like mycotic painful palpation.  Assessment: Pain in limb secondary onychomycosis 1 through 5 bilateral.  Plan: Debridement of toenails 1 through 5 bilateral.

## 2018-07-10 NOTE — Therapy (Signed)
[facility] [address] [place], [address] Phone: [phone]   Fax:  [phone]  Physical Therapy Treatment  Patient Details  Name: [name] [name] MRN: [id number] Date of Birth: March 04, 1941 Referring Provider (PT): Eliezer Lofts   Encounter Date: 07/10/2018  PT End of Session - 07/10/18 1111    Visit Number  15    Number of Visits  21    Date for PT Re-Evaluation  07/30/18    Authorization Type  5/ 10 G Code    PT Start Time  1030    PT Stop Time  1115    PT Time Calculation (min)  45 min    Activity Tolerance  Patient tolerated treatment well    Behavior During Therapy  [facility] for tasks assessed/performed       Past Medical History:  Diagnosis Date  . Arthritis   . Chronic lower back pain   . Diverticulitis   . GERD (gastroesophageal reflux disease)   . Hemorrhoids, internal, with bleeding   . Hiatal hernia   . History of colon polyps   . HLD (hyperlipidemia)   . Hypertension   . OSA (obstructive sleep apnea)   . PUD (peptic ulcer disease)   . Scoliosis   . Thyroid disease    hypo  . Thyroid nodule   . Vaginitis, atrophic   . Vertigo     Past Surgical History:  Procedure Laterality Date  . ANKLE FUSION  1967   LEFT  . CHOLECYSTECTOMY     2004  . COLONOSCOPY  06/22/2011   diverticulosis (no adenomas since 2001)  . FRACTURE SURGERY Left 1967   foot and ankle surgery    There were no vitals filed for this visit.  Subjective Assessment - 07/10/18 1051    Subjective  Patient reports increased pain with performing prolonged walking (10-28min) but otherwise has not been bothering her considerably.    Pertinent History  ankle fusion on the R side, lumbar scoliosis, menieres disease    Limitations  Lifting;Walking;Standing    How long can you walk comfortably?  Immediate pain    Patient Stated Goals  To decrease pain    Currently in Pain?  No/denies    Pain Onset  More than a month ago          TREATMENT  Therapeutic Exercise Standing hip extension at hip machine - x 20 25# Standing hip abduction at hip machine - x20 25# Standing hip extension with one foot on airex pad - x 15 Hip hikes on airex pad - x 20  Sit to stand squats with UE support - x 20  Hip abduction with sliders - x 10  Side stepping with arms straight with GTB - x 10 B  Nustep level 4 with seat level 9 - x6.5  min   Patient demonstrates increased fatigue at the end of the session    PT Education - 07/10/18 1057    Education Details  form/technique with exercise    Person(s) Educated  Patient    Methods  Explanation;Demonstration    Comprehension  Verbalized understanding;Returned demonstration          PT Long Term Goals - 06/17/18 1707      PT LONG TERM GOAL #1   Title  Patient will be independent with exercise performance and progression to continue benefits of therapy after discharge    Baseline  form/technique with exercise; 06/17/2018: Moderate cueing on form/technique  Time  6    Period  Weeks    Status  On-going      PT LONG TERM GOAL #2   Title  Patient will improve 66mwt to over 56m/s to indicate a decrease in fall risk with performing community ambulation    Baseline  .71 m/s; 06/17/2018: .62m/s    Time  6    Period  Weeks    Status  On-going      PT LONG TERM GOAL #3   Title  Patient will have a worst pain of 3/10 to indicate significant improvement and greater ability to perform standing with less pain allowing for performance of greater amount of ADLs.     Baseline  7/10 worst pain; 06/17/2018: 5/10 worst pain    Time  6    Period  Weeks    Status  On-going            Plan - 07/10/18 1111    Clinical Impression Statement  Patient demonstrates poor hip strength with inability to perform exercises with significant resistance with full AROM. Patient continues to have increased pain with prolonged walking however, this is improving overall. Patient will benefit  from further skilled therapy to return to prior level of function.     Rehab Potential  Good    Clinical Impairments Affecting Rehab Potential  (+) highly motivated, active lifestyle (-) chronicity, age    PT Frequency  2x / week    PT Duration  6 weeks    PT Treatment/Interventions  Gait training;Stair training;Functional mobility training;Therapeutic activities;Therapeutic exercise;Balance training;Electrical Stimulation;Cryotherapy;Iontophoresis 4mg /ml Dexamethasone;Moist Heat;Traction;Patient/family education;Manual techniques;Neuromuscular re-education;Passive range of motion;Dry needling    PT Next Visit Plan  progress exercise performance and technique    PT Home Exercise Plan  See education section(s)    Consulted and Agree with Plan of Care  Patient       Patient will benefit from skilled therapeutic intervention in order to improve the following deficits and impairments:  Decreased endurance, Decreased activity tolerance, Decreased balance, Pain, Decreased range of motion, Decreased strength, Difficulty walking, Abnormal gait, Increased fascial restricitons, Decreased coordination, Increased muscle spasms  Visit Diagnosis: Chronic right-sided low back pain without sciatica  Muscle weakness (generalized)     Problem List Patient Active Problem List   Diagnosis Date Noted  . Hyponatremia 01/08/2018  . Meniere's disease 01/08/2018  . 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 Lower extremity edema 08/09/2017  . Gait instability 08/09/2017  . Choking sensation 06/21/2017  . Inclusion cyst of vulva 05/14/2017  . Spasm of thoracic back muscle 05/11/2017  . Vulvar lesion 03/05/2017  . Situational anxiety 02/09/2017  . Puncture wound 01/05/2017  . Chronic insomnia 01/02/2017  . Counseling regarding end of life decision making 12/18/2014  . Raynaud phenomenon 08/31/2014  . Chronic allergic conjunctivitis 03/15/2014  . PAD (peripheral artery disease) ([place])  03/05/2013  . Chronic back pain greater than 3 months duration 08/13/2012  . S/P ankle fusion 02/06/2011  . TEMPOROMANDIBULAR JOINT PAIN 05/20/2010  . Carotid stenosis 06/30/2009  . Multiple thyroid nodules 03/15/2009  . Obstructive sleep apnea 03/05/2009  . HEMORRHOIDS, INTERNAL, WITH BLEEDING 05/09/2007  . VERTIGO 12/18/2006  . COLONIC POLYPS 12/17/2006  . HYPERCHOLESTEROLEMIA 12/17/2006  . Essential hypertension 12/17/2006  . DIVERTICULAR DISEASE 12/17/2006  . VAGINITIS, ATROPHIC 12/17/2006  . Osteoarthritis, generalized 12/17/2006  . SCOLIOSIS 12/17/2006  . Seasonal and perennial allergic rhinitis 12/17/2006    Blythe Stanford, PT DPT 07/10/2018, 12:40 PM  [place]  [facility] [address] [place], [address] Phone: [phone]   Fax:  [phone]  Name: [name] [name] MRN: [id number] Date of Birth: 1941/08/18

## 2018-07-12 ENCOUNTER — Ambulatory Visit (INDEPENDENT_AMBULATORY_CARE_PROVIDER_SITE_OTHER): Payer: Medicare Other | Admitting: Family Medicine

## 2018-07-12 ENCOUNTER — Encounter: Payer: Self-pay | Admitting: Family Medicine

## 2018-07-12 VITALS — BP 130/78 | HR 79 | Temp 97.7°F | Ht 62.5 in | Wt 167.2 lb

## 2018-07-12 DIAGNOSIS — E7800 Pure hypercholesterolemia, unspecified: Secondary | ICD-10-CM | POA: Diagnosis not present

## 2018-07-12 DIAGNOSIS — Z23 Encounter for immunization: Secondary | ICD-10-CM | POA: Diagnosis not present

## 2018-07-12 DIAGNOSIS — R600 Localized edema: Secondary | ICD-10-CM | POA: Diagnosis not present

## 2018-07-12 DIAGNOSIS — I5189 Other ill-defined heart diseases: Secondary | ICD-10-CM

## 2018-07-12 DIAGNOSIS — I6523 Occlusion and stenosis of bilateral carotid arteries: Secondary | ICD-10-CM

## 2018-07-12 DIAGNOSIS — E871 Hypo-osmolality and hyponatremia: Secondary | ICD-10-CM | POA: Diagnosis not present

## 2018-07-12 DIAGNOSIS — I10 Essential (primary) hypertension: Secondary | ICD-10-CM

## 2018-07-12 LAB — BASIC METABOLIC PANEL
BUN: 20 mg/dL (ref 6–23)
CHLORIDE: 99 meq/L (ref 96–112)
CO2: 30 meq/L (ref 19–32)
Calcium: 9.8 mg/dL (ref 8.4–10.5)
Creatinine, Ser: 0.77 mg/dL (ref 0.40–1.20)
GFR: 76.88 mL/min (ref >60.00)
GLUCOSE: 77 mg/dL (ref 70–99)
POTASSIUM: 4.2 meq/L (ref 3.5–5.1)
SODIUM: 135 meq/L (ref 135–145)

## 2018-07-12 MED ORDER — GUAIFENESIN-CODEINE 100-10 MG/5ML PO SYRP: 5.0000 mL | ORAL_SOLUTION | Freq: Every evening | ORAL | 0 refills | Status: DC | PRN

## 2018-07-12 MED ORDER — BENZONATATE 200 MG PO CAPS: 200.0000 mg | ORAL_CAPSULE | Freq: Two times a day (BID) | ORAL | 1 refills | Status: DC | PRN

## 2018-07-12 NOTE — Assessment & Plan Note (Signed)
Good control in office but trending up.

## 2018-07-12 NOTE — Assessment & Plan Note (Signed)
Due to HCTZ, but she does not do well off of this. LAsix is too strong per pt.. Causes diarrhea and overdiuresis if taken regularly per pt. In past she has been able to keep Na up on higher NA diet on HCTZ.  Will re-eval Na today and restart HCTZ and high Na diet. Recheck in 2 weeks.

## 2018-07-12 NOTE — Progress Notes (Signed)
Subjective:    Patient ID: [name] [name], female    DOB: Jun 28, 1941, 77 y.o.   MRN: [id number]  HPI  77 year old female present for follow up.   Hypertension:BP well controlled in office today.  recent labs showed hyponatremia at 129.. Told to hold HCTZ.  Has lasix to use prn.. She has not used this.   Since stopping the HCTZ 4 days ago she has had significant recurrence of edema, bloating in abdomen. She feels more short of breath.   She has gained 2-5 lbs. Using medication without problems or lightheadedness:  Chest pain with exertion:none Edema:yes Short of breath: yes.. With fluid back on. Average home BPs: higher than they had been. In Afternoon 989 systolic. Other issues: Wt Readings from Last 3 Encounters:  07/12/18 167 lb 4 oz (75.9 kg)  02/28/18 162 lb 4.8 oz (73.6 kg)  02/19/18 162 lb (73.5 kg)    Elevated Cholesterol:   Her cholesterol today is great and almost at goal LDL < 70!! On red yeast rice. Lab Results  Component Value Date   CHOL 164 07/08/2018   HDL 61.80 07/08/2018   LDLCALC 73 07/08/2018   LDLDIRECT 66.0 12/20/2017   TRIG 146.0 07/08/2018   CHOLHDL 3 07/08/2018  Using medications without problems: Muscle aches:  Diet compliance: Exercise: Other complaints:  She is going out of town for a few week and is worried that her allergy cough will come back on the trip.Marland Kitchen Request a Rx to have for cough suppressant to use as needed.  Social History /Family History/Past Medical History reviewed in detail and updated in EMR if needed. Blood pressure 130/78, pulse 79, temperature 97.7 F (36.5 C), temperature source Oral, height 5' 2.5" (1.588 m), weight 167 lb 4 oz (75.9 kg).  Review of Systems  Constitutional: Positive for fatigue.  Respiratory: Positive for shortness of breath.   Cardiovascular: Negative for chest pain.       Objective:   Physical Exam  Constitutional: Vital signs are normal. She appears well-developed and well-nourished. She is  cooperative.  Non-toxic appearance. She does not appear ill. No distress.  HENT:  Head: Normocephalic.  Right Ear: Hearing, tympanic membrane, external ear and ear canal normal. Tympanic membrane is not erythematous, not retracted and not bulging.  Left Ear: Hearing, tympanic membrane, external ear and ear canal normal. Tympanic membrane is not erythematous, not retracted and not bulging.  Nose: No mucosal edema or rhinorrhea. Right sinus exhibits no maxillary sinus tenderness and no frontal sinus tenderness. Left sinus exhibits no maxillary sinus tenderness and no frontal sinus tenderness.  Mouth/Throat: Uvula is midline, oropharynx is clear and moist and mucous membranes are normal.  Eyes: Pupils are equal, round, and reactive to light. Conjunctivae, EOM and lids are normal. Lids are everted and swept, no foreign bodies found.  Neck: Trachea normal and normal range of motion. Neck supple. Carotid bruit is not present. No thyroid mass and no thyromegaly present.  Cardiovascular: Normal rate, regular rhythm, S1 normal, S2 normal, normal heart sounds, intact distal pulses and normal pulses. Exam reveals no gallop and no friction rub.  No murmur heard.  1 plus peripheral edema  Pulmonary/Chest: Effort normal and breath sounds normal. No tachypnea. No respiratory distress. She has no decreased breath sounds. She has no wheezes. She has no rhonchi. She has no rales.  Abdominal: Soft. Normal appearance and bowel sounds are normal. There is no tenderness.  Neurological: She is alert.  Skin: Skin is warm, dry and  intact. No rash noted.  Psychiatric: Her speech is normal and behavior is normal. Judgment and thought content normal. Her mood appears not anxious. Cognition and memory are normal. She does not exhibit a depressed mood.          Assessment & Plan:

## 2018-07-12 NOTE — Assessment & Plan Note (Signed)
Fluid overloaded today. Pulmonary exam clear. Take a dose of lasix and restart HCTZ .

## 2018-07-12 NOTE — Patient Instructions (Addendum)
Please stop at the lab to have labs drawn. Take a 1/2 tab of lasix today.  As long as sodium has increase some.. Will plan to restart HCTZ daily.  Increase salt in diet.. propel water etc.  Follow up lab only appt  in 2 weeks for sodium recheck.

## 2018-07-15 ENCOUNTER — Ambulatory Visit: Payer: Medicare Other

## 2018-07-15 DIAGNOSIS — M545 Low back pain: Principal | ICD-10-CM

## 2018-07-15 DIAGNOSIS — M62830 Muscle spasm of back: Secondary | ICD-10-CM

## 2018-07-15 DIAGNOSIS — G8929 Other chronic pain: Secondary | ICD-10-CM

## 2018-07-15 DIAGNOSIS — M6281 Muscle weakness (generalized): Secondary | ICD-10-CM

## 2018-07-15 NOTE — Therapy (Signed)
[facility] [address] [place], [address] Phone: [phone]   Fax:  [phone]  Physical Therapy Treatment  Patient Details  Name: [name] [name] MRN: [id number] Date of Birth: [birth year] Referring Provider (PT): Amy Diona Browner   Encounter Date: 07/15/2018  PT End of Session - 07/15/18 1059    Visit Number  16    Number of Visits  21    Date for PT Re-Evaluation  07/30/18    Authorization Type  6/ 10 G Code    PT Start Time  1030    PT Stop Time  1115    PT Time Calculation (min)  45 min    Activity Tolerance  Patient tolerated treatment well    Behavior During Therapy  [facility] for tasks assessed/performed       Past Medical History:  Diagnosis Date  . Arthritis   . Chronic lower back pain   . Diverticulitis   . GERD (gastroesophageal reflux disease)   . Hemorrhoids, internal, with bleeding   . Hiatal hernia   . History of colon polyps   . HLD (hyperlipidemia)   . Hypertension   . OSA (obstructive sleep apnea)   . PUD (peptic ulcer disease)   . Scoliosis   . Thyroid disease    hypo  . Thyroid nodule   . Vaginitis, atrophic   . Vertigo     Past Surgical History:  Procedure Laterality Date  . ANKLE FUSION  1967   LEFT  . CHOLECYSTECTOMY     2004  . COLONOSCOPY  06/22/2011   diverticulosis (no adenomas since 2001)  . FRACTURE SURGERY Left 1967   foot and ankle surgery    There were no vitals filed for this visit.  Subjective Assessment - 07/15/18 1043    Subjective  Patient reports no major changes but states her back has been improving considerably. Patient states decreased ability to ambulate for long periods of time.    Pertinent History  ankle fusion on the R side, lumbar scoliosis, menieres disease    Limitations  Lifting;Walking;Standing    How long can you walk comfortably?  Immediate pain    Patient Stated Goals  To decrease pain    Currently in Pain?  No/denies    Pain  Onset  More than a month ago       TREATMENT  Therapeutic Exercise Sit to stand squats with UE support - x 20  Standing squats in standing - x 10  Standing hip extension with one foot on airex pad - x 15 Side stepping with YTB around knee on airex beam - x 15 Unilateral Farmers carry with 5# weight - 37ft x 4 B  Standing hip extension at hip machine - x 20 25# Standing hip abduction at hip machine - x20 25# Nustep level 4 with seat level 9 - x6.5 min   Patient demonstrates increased fatigue at the end of the session   PT Education - 07/15/18 1051    Education Details  form/technique with exercise    Person(s) Educated  Patient    Methods  Explanation;Demonstration    Comprehension  Verbalized understanding;Returned demonstration          PT Long Term Goals - 06/17/18 1707      PT LONG TERM GOAL #1   Title  Patient will be independent with exercise performance and progression to continue benefits of therapy after discharge    Baseline  form/technique with exercise;  06/17/2018: Moderate cueing on form/technique    Time  6    Period  Weeks    Status  On-going      PT LONG TERM GOAL #2   Title  Patient will improve 60mwt to over 2m/s to indicate a decrease in fall risk with performing community ambulation    Baseline  .71 m/s; 06/17/2018: .51m/s    Time  6    Period  Weeks    Status  On-going      PT LONG TERM GOAL #3   Title  Patient will have a worst pain of 3/10 to indicate significant improvement and greater ability to perform standing with less pain allowing for performance of greater amount of ADLs.     Baseline  7/10 worst pain; 06/17/2018: 5/10 worst pain    Time  6    Period  Weeks    Status  On-going            Plan - 07/15/18 1102    Clinical Impression Statement  Patient demonstrates difficulty with performing farmers carry indicating poor ability to carry groceries into her house. Patient continues to have improvement with ability to perform  greater amount of balance and strengthening exercises. Patient will benefit from further skilled therapy to return to prior level of function.     Rehab Potential  Good    Clinical Impairments Affecting Rehab Potential  (+) highly motivated, active lifestyle (-) chronicity, age    PT Frequency  2x / week    PT Duration  6 weeks    PT Treatment/Interventions  Gait training;Stair training;Functional mobility training;Therapeutic activities;Therapeutic exercise;Balance training;Electrical Stimulation;Cryotherapy;Iontophoresis 4mg /ml Dexamethasone;Moist Heat;Traction;Patient/family education;Manual techniques;Neuromuscular re-education;Passive range of motion;Dry needling    PT Next Visit Plan  progress exercise performance and technique    PT Home Exercise Plan  See education section(s)    Consulted and Agree with Plan of Care  Patient       Patient will benefit from skilled therapeutic intervention in order to improve the following deficits and impairments:  Decreased endurance, Decreased activity tolerance, Decreased balance, Pain, Decreased range of motion, Decreased strength, Difficulty walking, Abnormal gait, Increased fascial restricitons, Decreased coordination, Increased muscle spasms  Visit Diagnosis: Chronic right-sided low back pain without sciatica  Muscle weakness (generalized)  Muscle spasm of back     Problem List Patient Active Problem List   Diagnosis Date Noted  . Hyponatremia 01/08/2018  . Meniere's disease 01/08/2018  . 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 Lower extremity edema 08/09/2017  . Gait instability 08/09/2017  . Choking sensation 06/21/2017  . Inclusion cyst of vulva 05/14/2017  . Spasm of thoracic back muscle 05/11/2017  . Vulvar lesion 03/05/2017  . Situational anxiety 02/09/2017  . Puncture wound 01/05/2017  . Chronic insomnia 01/02/2017  . Counseling regarding end of life decision making 12/18/2014  . Raynaud phenomenon  08/31/2014  . Chronic allergic conjunctivitis 03/15/2014  . PAD (peripheral artery disease) ([place]) 03/05/2013  . Chronic back pain greater than 3 months duration 08/13/2012  . S/P ankle fusion 02/06/2011  . TEMPOROMANDIBULAR JOINT PAIN 05/20/2010  . Carotid stenosis 06/30/2009  . Multiple thyroid nodules 03/15/2009  . Obstructive sleep apnea 03/05/2009  . HEMORRHOIDS, INTERNAL, WITH BLEEDING 05/09/2007  . VERTIGO 12/18/2006  . COLONIC POLYPS 12/17/2006  . HYPERCHOLESTEROLEMIA 12/17/2006  . Essential hypertension 12/17/2006  . DIVERTICULAR DISEASE 12/17/2006  . VAGINITIS, ATROPHIC 12/17/2006  . Osteoarthritis, generalized 12/17/2006  . SCOLIOSIS 12/17/2006  . Seasonal and perennial allergic rhinitis  12/17/2006    Blythe Stanford, PT DPT 07/15/2018, 11:12 AM  [facility] [address] [place], [address] Phone: [phone]   Fax:  [phone]  Name: [name] [name] MRN: [id number] Date of Birth: Aug 24, 1941

## 2018-07-17 ENCOUNTER — Ambulatory Visit: Payer: Medicare Other

## 2018-07-17 DIAGNOSIS — M545 Low back pain: Principal | ICD-10-CM

## 2018-07-17 DIAGNOSIS — G8929 Other chronic pain: Secondary | ICD-10-CM

## 2018-07-17 DIAGNOSIS — M62830 Muscle spasm of back: Secondary | ICD-10-CM

## 2018-07-17 DIAGNOSIS — M6281 Muscle weakness (generalized): Secondary | ICD-10-CM

## 2018-07-17 NOTE — Therapy (Signed)
[place] [facility] [address], [place], [address] Phone: [phone]   Fax:  [phone]  Physical Therapy Treatment  Patient Details  Name: [name] [name] MRN: [id number] Date of Birth: 28-Apr-1941 Referring Provider (PT): Eliezer Lofts   Encounter Date: 07/17/2018  PT End of Session - 07/17/18 1132    Visit Number  17    Number of Visits  21    Date for PT Re-Evaluation  07/30/18    Authorization Type  7/ 10 G Code    PT Start Time  1115    PT Stop Time  1200    PT Time Calculation (min)  45 min    Activity Tolerance  Patient tolerated treatment well    Behavior During Therapy  [facility] for tasks assessed/performed       Past Medical History:  Diagnosis Date  . Arthritis   . Chronic lower back pain   . Diverticulitis   . GERD (gastroesophageal reflux disease)   . Hemorrhoids, internal, with bleeding   . Hiatal hernia   . History of colon polyps   . HLD (hyperlipidemia)   . Hypertension   . OSA (obstructive sleep apnea)   . PUD (peptic ulcer disease)   . Scoliosis   . Thyroid disease    hypo  . Thyroid nodule   . Vaginitis, atrophic   . Vertigo     Past Surgical History:  Procedure Laterality Date  . ANKLE FUSION  1967   LEFT  . CHOLECYSTECTOMY     2004  . COLONOSCOPY  06/22/2011   diverticulosis (no adenomas since 2001)  . FRACTURE SURGERY Left 1967   foot and ankle surgery    There were no vitals filed for this visit.  Subjective Assessment - 07/17/18 1125    Subjective  Patient reports no major changes since the previous session. Patient states she continues to improve in strengthening and working on walking for prolonged periods of time.     Pertinent History  ankle fusion on the R side, lumbar scoliosis, menieres disease    Limitations  Lifting;Walking;Standing    How long can you walk comfortably?  Immediate pain    Patient Stated Goals  To decrease pain    Currently in Pain?   No/denies    Pain Onset  More than a month ago       TREATMENT  Therapeutic Exercise Sit to stand squats with UE support - x 20  Hip sliders with running man with UE support - x 20  Standing hip extension at hip machine - x 20 25# Standing hip abduction at hip machine - x20 25# Hip abduction with RTB - x 20 with UE support  Hip extension with RTB - x 20 with UE support  Upward chops with black tubing - x 20  Downward chops with black tubing - x20 Nustep level 4 with seat level 9 - x6 min   Patient demonstrates increased fatigue at the end of the session   PT Education - 07/17/18 1131    Education Details  form/technique with exercise    Person(s) Educated  Patient    Methods  Explanation;Demonstration    Comprehension  Verbalized understanding;Returned demonstration          PT Long Term Goals - 06/17/18 1707      PT LONG TERM GOAL #1   Title  Patient will be independent with exercise performance and progression to continue benefits of therapy after  discharge    Baseline  form/technique with exercise; 06/17/2018: Moderate cueing on form/technique    Time  6    Period  Weeks    Status  On-going      PT LONG TERM GOAL #2   Title  Patient will improve 51mwt to over 55m/s to indicate a decrease in fall risk with performing community ambulation    Baseline  .71 m/s; 06/17/2018: .72m/s    Time  6    Period  Weeks    Status  On-going      PT LONG TERM GOAL #3   Title  Patient will have a worst pain of 3/10 to indicate significant improvement and greater ability to perform standing with less pain allowing for performance of greater amount of ADLs.     Baseline  7/10 worst pain; 06/17/2018: 5/10 worst pain    Time  6    Period  Weeks    Status  On-going            Plan - 07/17/18 1150    Clinical Impression Statement  Continues to perform exercises in standing to improve hip stabilization and lumbar strengthening. Patient demonstrates ability to perform greater  amount of exercises today indicating functional carryover. Although patient is improving she continues to have increased pain and spasms with prolonged ambulation. Patient will benefit from further skilled therapy to return to prior level of function.     Rehab Potential  Good    Clinical Impairments Affecting Rehab Potential  (+) highly motivated, active lifestyle (-) chronicity, age    PT Frequency  2x / week    PT Duration  6 weeks    PT Treatment/Interventions  Gait training;Stair training;Functional mobility training;Therapeutic activities;Therapeutic exercise;Balance training;Electrical Stimulation;Cryotherapy;Iontophoresis 4mg /ml Dexamethasone;Moist Heat;Traction;Patient/family education;Manual techniques;Neuromuscular re-education;Passive range of motion;Dry needling    PT Next Visit Plan  progress exercise performance and technique    PT Home Exercise Plan  See education section(s)    Consulted and Agree with Plan of Care  Patient       Patient will benefit from skilled therapeutic intervention in order to improve the following deficits and impairments:  Decreased endurance, Decreased activity tolerance, Decreased balance, Pain, Decreased range of motion, Decreased strength, Difficulty walking, Abnormal gait, Increased fascial restricitons, Decreased coordination, Increased muscle spasms  Visit Diagnosis: Chronic right-sided low back pain without sciatica  Muscle weakness (generalized)  Muscle spasm of back     Problem List Patient Active Problem List   Diagnosis Date Noted  . Hyponatremia 01/08/2018  . Meniere's disease 01/08/2018  . 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 Lower extremity edema 08/09/2017  . Gait instability 08/09/2017  . Choking sensation 06/21/2017  . Inclusion cyst of vulva 05/14/2017  . Spasm of thoracic back muscle 05/11/2017  . Vulvar lesion 03/05/2017  . Situational anxiety 02/09/2017  . Puncture wound 01/05/2017  . Chronic  insomnia 01/02/2017  . Counseling regarding end of life decision making 12/18/2014  . Raynaud phenomenon 08/31/2014  . Chronic allergic conjunctivitis 03/15/2014  . PAD (peripheral artery disease) ([place]) 03/05/2013  . Chronic back pain greater than 3 months duration 08/13/2012  . S/P ankle fusion 02/06/2011  . TEMPOROMANDIBULAR JOINT PAIN 05/20/2010  . Carotid stenosis 06/30/2009  . Multiple thyroid nodules 03/15/2009  . Obstructive sleep apnea 03/05/2009  . HEMORRHOIDS, INTERNAL, WITH BLEEDING 05/09/2007  . VERTIGO 12/18/2006  . COLONIC POLYPS 12/17/2006  . HYPERCHOLESTEROLEMIA 12/17/2006  . Essential hypertension 12/17/2006  . DIVERTICULAR DISEASE 12/17/2006  . VAGINITIS,  ATROPHIC 12/17/2006  . Osteoarthritis, generalized 12/17/2006  . SCOLIOSIS 12/17/2006  . Seasonal and perennial allergic rhinitis 12/17/2006    Blythe Stanford, PT DPT 07/17/2018, 11:57 AM  [place] [facility] [address] [place], [address] Phone: [phone]   Fax:  [phone]  Name: [name] [name] MRN: [id number] Date of Birth: Sep 29, 1940

## 2018-07-22 ENCOUNTER — Ambulatory Visit: Payer: Medicare Other

## 2018-07-22 DIAGNOSIS — M545 Low back pain: Secondary | ICD-10-CM | POA: Diagnosis not present

## 2018-07-22 DIAGNOSIS — G8929 Other chronic pain: Secondary | ICD-10-CM

## 2018-07-22 DIAGNOSIS — M62830 Muscle spasm of back: Secondary | ICD-10-CM

## 2018-07-22 DIAGNOSIS — M6281 Muscle weakness (generalized): Secondary | ICD-10-CM

## 2018-07-22 NOTE — Therapy (Signed)
[place] [facility] [address], [place], [address] Phone: [phone]   Fax:  [phone]  Physical Therapy Treatment  Patient Details  Name: [name] [name] MRN: [id number] Date of Birth: [birth year] Referring Provider (PT): Amy Diona Browner   Encounter Date: 07/22/2018  PT End of Session - 07/22/18 1118    Visit Number  18    Number of Visits  21    Date for PT Re-Evaluation  07/30/18    Authorization Type  8/ 10 G Code    PT Start Time  1117    PT Stop Time  1202    PT Time Calculation (min)  45 min    Activity Tolerance  Patient tolerated treatment well    Behavior During Therapy  [facility] for tasks assessed/performed       Past Medical History:  Diagnosis Date  . Arthritis   . Chronic lower back pain   . Diverticulitis   . GERD (gastroesophageal reflux disease)   . Hemorrhoids, internal, with bleeding   . Hiatal hernia   . History of colon polyps   . HLD (hyperlipidemia)   . Hypertension   . OSA (obstructive sleep apnea)   . PUD (peptic ulcer disease)   . Scoliosis   . Thyroid disease    hypo  . Thyroid nodule   . Vaginitis, atrophic   . Vertigo     Past Surgical History:  Procedure Laterality Date  . ANKLE FUSION  1967   LEFT  . CHOLECYSTECTOMY     2004  . COLONOSCOPY  06/22/2011   diverticulosis (no adenomas since 2001)  . FRACTURE SURGERY Left 1967   foot and ankle surgery    There were no vitals filed for this visit.  Subjective Assessment - 07/22/18 1126    Subjective  Patient reports her pain is about usual today, standing is most aggravating factor.      Pertinent History  ankle fusion on the R side, lumbar scoliosis, menieres disease    Limitations  Lifting;Walking;Standing    How long can you walk comfortably?  Immediate pain    Patient Stated Goals  To decrease pain    Pain Score  5     Pain Orientation  Lower    Pain Descriptors / Indicators  Aching    Pain Onset  More than a  month ago           TREATMENT  Therapeutic Exercise: With verbal/visual cues, goal: improve strength, motion Sit to stand squats with UE support - x 20  Hip sliders with running man with UE support - x 20  Hip sliders into abduction with UE support x20 bilaterally  Standing hip extension at hip machine - x 20 25# Standing hip abduction at hip machine - x20 25# Hip abduction with RTB - x 20 with UE support  Hip extension with RTB - x 20 with UE support  Upward chops with black tubing - x 20  Downward chops with black tubing - x20 Nustep level 4 with seat level 9 - x6 min  Patient demonstrated increased fatigue at the end of the session, especially with chopping motions. Decreased complaints of pain.    PT Education - 07/22/18 1117    Education Details  exercise technique/form    Person(s) Educated  Patient    Methods  Explanation;Demonstration    Comprehension  Verbalized understanding;Returned demonstration          PT Long Term Goals -  06/17/18 1707      PT LONG TERM GOAL #1   Title  Patient will be independent with exercise performance and progression to continue benefits of therapy after discharge    Baseline  form/technique with exercise; 06/17/2018: Moderate cueing on form/technique    Time  6    Period  Weeks    Status  On-going      PT LONG TERM GOAL #2   Title  Patient will improve 9mwt to over 110m/s to indicate a decrease in fall risk with performing community ambulation    Baseline  .71 m/s; 06/17/2018: .22m/s    Time  6    Period  Weeks    Status  On-going      PT LONG TERM GOAL #3   Title  Patient will have a worst pain of 3/10 to indicate significant improvement and greater ability to perform standing with less pain allowing for performance of greater amount of ADLs.     Baseline  7/10 worst pain; 06/17/2018: 5/10 worst pain    Time  6    Period  Weeks    Status  On-going            Plan - 07/22/18 1201    Clinical Impression Statement   Patient demonstrated increased fatigue at the end of the session, especially with chopping motions. Decreased complaints of pain to 1/10 at end of session.    Clinical Impairments Affecting Rehab Potential  (+) highly motivated, active lifestyle (-) chronicity, age    PT Frequency  2x / week    PT Duration  6 weeks    PT Treatment/Interventions  Gait training;Stair training;Functional mobility training;Therapeutic activities;Therapeutic exercise;Balance training;Electrical Stimulation;Cryotherapy;Iontophoresis 4mg /ml Dexamethasone;Moist Heat;Traction;Patient/family education;Manual techniques;Neuromuscular re-education;Passive range of motion;Dry needling    PT Next Visit Plan  progress exercise performance and technique    PT Home Exercise Plan  See education section(s)    Consulted and Agree with Plan of Care  Patient       Patient will benefit from skilled therapeutic intervention in order to improve the following deficits and impairments:  Decreased endurance, Decreased activity tolerance, Decreased balance, Pain, Decreased range of motion, Decreased strength, Difficulty walking, Abnormal gait, Increased fascial restricitons, Decreased coordination, Increased muscle spasms  Visit Diagnosis: Chronic right-sided low back pain without sciatica  Muscle weakness (generalized)  Muscle spasm of back     Problem List Patient Active Problem List   Diagnosis Date Noted  . Hyponatremia 01/08/2018  . Meniere's disease 01/08/2018  . 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 Lower extremity edema 08/09/2017  . Gait instability 08/09/2017  . Choking sensation 06/21/2017  . Inclusion cyst of vulva 05/14/2017  . Spasm of thoracic back muscle 05/11/2017  . Vulvar lesion 03/05/2017  . Situational anxiety 02/09/2017  . Puncture wound 01/05/2017  . Chronic insomnia 01/02/2017  . Counseling regarding end of life decision making 12/18/2014  . Raynaud phenomenon 08/31/2014  .  Chronic allergic conjunctivitis 03/15/2014  . PAD (peripheral artery disease) ([place]) 03/05/2013  . Chronic back pain greater than 3 months duration 08/13/2012  . S/P ankle fusion 02/06/2011  . TEMPOROMANDIBULAR JOINT PAIN 05/20/2010  . Carotid stenosis 06/30/2009  . Multiple thyroid nodules 03/15/2009  . Obstructive sleep apnea 03/05/2009  . HEMORRHOIDS, INTERNAL, WITH BLEEDING 05/09/2007  . VERTIGO 12/18/2006  . COLONIC POLYPS 12/17/2006  . HYPERCHOLESTEROLEMIA 12/17/2006  . Essential hypertension 12/17/2006  . DIVERTICULAR DISEASE 12/17/2006  . VAGINITIS, ATROPHIC 12/17/2006  . Osteoarthritis, generalized 12/17/2006  .  SCOLIOSIS 12/17/2006  . Seasonal and perennial allergic rhinitis 12/17/2006    Lieutenant Diego PT, DPT 12:09 PM,07/22/18 [place] [facility] [address] [place], [address] Phone: [phone]   Fax:  [phone]  Name: [name] [name] MRN: [id number] Date of Birth: 08-Mar-1941

## 2018-07-24 ENCOUNTER — Ambulatory Visit: Payer: Medicare Other

## 2018-07-24 DIAGNOSIS — G8929 Other chronic pain: Secondary | ICD-10-CM

## 2018-07-24 DIAGNOSIS — M545 Low back pain: Principal | ICD-10-CM

## 2018-07-24 DIAGNOSIS — M6281 Muscle weakness (generalized): Secondary | ICD-10-CM

## 2018-07-24 DIAGNOSIS — M62830 Muscle spasm of back: Secondary | ICD-10-CM

## 2018-07-24 NOTE — Therapy (Signed)
[place] [facility] [address] [place], [address] Phone: [phone]   Fax:  [phone]  Physical Therapy Treatment  Patient Details  Name: [name] [name] MRN: [id number] Date of Birth: 03/09/1941 Referring Provider (PT): Amy Diona Browner   Encounter Date: 07/24/2018  PT End of Session - 07/24/18 1200    Visit Number  19    Number of Visits  21    Date for PT Re-Evaluation  07/30/18    Authorization Type  9/ 10 G Code    Authorization Time Period  [employer] [place]    PT Start Time  1117    PT Stop Time  1200    PT Time Calculation (min)  43 min    Activity Tolerance  Patient tolerated treatment well    Behavior During Therapy  [facility] for tasks assessed/performed       Past Medical History:  Diagnosis Date  . Arthritis   . Chronic lower back pain   . Diverticulitis   . GERD (gastroesophageal reflux disease)   . Hemorrhoids, internal, with bleeding   . Hiatal hernia   . History of colon polyps   . HLD (hyperlipidemia)   . Hypertension   . OSA (obstructive sleep apnea)   . PUD (peptic ulcer disease)   . Scoliosis   . Thyroid disease    hypo  . Thyroid nodule   . Vaginitis, atrophic   . Vertigo     Past Surgical History:  Procedure Laterality Date  . ANKLE FUSION  1967   LEFT  . CHOLECYSTECTOMY     2004  . COLONOSCOPY  06/22/2011   diverticulosis (no adenomas since 2001)  . FRACTURE SURGERY Left 1967   foot and ankle surgery    There were no vitals filed for this visit.  Subjective Assessment - 07/24/18 1159    Subjective  Patient states no increase in pain since the previous session and reports she is able to walk more confidently with less fear of falling.     Pertinent History  ankle fusion on the R side, lumbar scoliosis, menieres disease    Limitations  Lifting;Walking;Standing    How long can you walk comfortably?  Immediate pain    Patient Stated Goals  To decrease pain    Currently in Pain?   No/denies    Pain Onset  More than a month ago       TREATMENT  Therapeutic Exercise Standing hip abduction at hip machine - x20 25# Standing hip extension at hip machine - x 20 25# Hip sliders with running man with UE support - x 20  Hip sliders with abduction with UE support - x 20 Sit to stand squats with UE support - x 20 with a chair behind  Side stepping with GTB - x15 down and back  Straight chops with black tubing - x 10 B Low row with GTB - x 10  Nustep level 4 with seat level 9 - x6 min Walking backwards with GTB around knees - 2 x 30ft   Patient demonstrates increased fatigue at the end of the session   PT Education - 07/24/18 1200    Education Details  form/technique with exercise    Person(s) Educated  Patient    Methods  Explanation;Demonstration    Comprehension  Verbalized understanding;Returned demonstration          PT Long Term Goals - 06/17/18 1707      PT LONG TERM GOAL #  1   Title  Patient will be independent with exercise performance and progression to continue benefits of therapy after discharge    Baseline  form/technique with exercise; 06/17/2018: Moderate cueing on form/technique    Time  6    Period  Weeks    Status  On-going      PT LONG TERM GOAL #2   Title  Patient will improve 20mwt to over 33m/s to indicate a decrease in fall risk with performing community ambulation    Baseline  .71 m/s; 06/17/2018: .28m/s    Time  6    Period  Weeks    Status  On-going      PT LONG TERM GOAL #3   Title  Patient will have a worst pain of 3/10 to indicate significant improvement and greater ability to perform standing with less pain allowing for performance of greater amount of ADLs.     Baseline  7/10 worst pain; 06/17/2018: 5/10 worst pain    Time  6    Period  Weeks    Status  On-going            Plan - 07/24/18 1201    Clinical Impression Statement  Increased fatigue with hip strengthening exercises indicating poor muscular strength  most notably with hip abduction movements. Patient demonstrates improvement with exercises with ability to perform greater amount of exercise before onset of fatigue. Patient will benefit from further skilled therapy to return to prior level of function.     Clinical Impairments Affecting Rehab Potential  (+) highly motivated, active lifestyle (-) chronicity, age    PT Frequency  2x / week    PT Duration  6 weeks    PT Treatment/Interventions  Gait training;Stair training;Functional mobility training;Therapeutic activities;Therapeutic exercise;Balance training;Electrical Stimulation;Cryotherapy;Iontophoresis 4mg /ml Dexamethasone;Moist Heat;Traction;Patient/family education;Manual techniques;Neuromuscular re-education;Passive range of motion;Dry needling    PT Next Visit Plan  progress exercise performance and technique    PT Home Exercise Plan  See education section(s)    Consulted and Agree with Plan of Care  Patient       Patient will benefit from skilled therapeutic intervention in order to improve the following deficits and impairments:  Decreased endurance, Decreased activity tolerance, Decreased balance, Pain, Decreased range of motion, Decreased strength, Difficulty walking, Abnormal gait, Increased fascial restricitons, Decreased coordination, Increased muscle spasms  Visit Diagnosis: Chronic right-sided low back pain without sciatica  Muscle weakness (generalized)  Muscle spasm of back     Problem List Patient Active Problem List   Diagnosis Date Noted  . Hyponatremia 01/08/2018  . Meniere's disease 01/08/2018  . 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 Lower extremity edema 08/09/2017  . Gait instability 08/09/2017  . Choking sensation 06/21/2017  . Inclusion cyst of vulva 05/14/2017  . Spasm of thoracic back muscle 05/11/2017  . Vulvar lesion 03/05/2017  . Situational anxiety 02/09/2017  . Puncture wound 01/05/2017  . Chronic insomnia 01/02/2017  .  Counseling regarding end of life decision making 12/18/2014  . Raynaud phenomenon 08/31/2014  . Chronic allergic conjunctivitis 03/15/2014  . PAD (peripheral artery disease) ([place]) 03/05/2013  . Chronic back pain greater than 3 months duration 08/13/2012  . S/P ankle fusion 02/06/2011  . TEMPOROMANDIBULAR JOINT PAIN 05/20/2010  . Carotid stenosis 06/30/2009  . Multiple thyroid nodules 03/15/2009  . Obstructive sleep apnea 03/05/2009  . HEMORRHOIDS, INTERNAL, WITH BLEEDING 05/09/2007  . VERTIGO 12/18/2006  . COLONIC POLYPS 12/17/2006  . HYPERCHOLESTEROLEMIA 12/17/2006  . Essential hypertension 12/17/2006  . DIVERTICULAR  DISEASE 12/17/2006  . VAGINITIS, ATROPHIC 12/17/2006  . Osteoarthritis, generalized 12/17/2006  . SCOLIOSIS 12/17/2006  . Seasonal and perennial allergic rhinitis 12/17/2006    Blythe Stanford, PT DPT 07/24/2018, 12:48 PM  [place] [facility] [address] [place], [address] Phone: [phone]   Fax:  [phone]  Name: Kaidance Pantoja MRN: [id number] Date of Birth: Jun 02, 1941

## 2018-07-26 ENCOUNTER — Other Ambulatory Visit (INDEPENDENT_AMBULATORY_CARE_PROVIDER_SITE_OTHER): Payer: Medicare Other

## 2018-07-26 ENCOUNTER — Telehealth: Payer: Self-pay | Admitting: Family Medicine

## 2018-07-26 DIAGNOSIS — E876 Hypokalemia: Secondary | ICD-10-CM

## 2018-07-26 LAB — BASIC METABOLIC PANEL
BUN: 25 mg/dL — AB (ref 6–23)
CHLORIDE: 99 meq/L (ref 96–112)
CO2: 28 meq/L (ref 19–32)
Calcium: 9.6 mg/dL (ref 8.4–10.5)
Creatinine, Ser: 0.83 mg/dL (ref 0.40–1.20)
GFR: 70.5 mL/min (ref >60.00)
Glucose, Bld: 122 mg/dL — ABNORMAL HIGH (ref 70–99)
POTASSIUM: 3.8 meq/L (ref 3.5–5.1)
Sodium: 136 mEq/L (ref 135–145)

## 2018-07-26 NOTE — Telephone Encounter (Signed)
-----   Message from Lendon Collar, RT sent at 07/16/2018 10:29 AM EST ----- Regarding: Lab orders for Friday 07/26/18 Please enter 2 week follow up lab orders for 07/26/18. Thanks!

## 2018-07-29 ENCOUNTER — Ambulatory Visit: Payer: Medicare Other

## 2018-07-29 DIAGNOSIS — M6281 Muscle weakness (generalized): Secondary | ICD-10-CM

## 2018-07-29 DIAGNOSIS — M545 Low back pain, unspecified: Secondary | ICD-10-CM

## 2018-07-29 DIAGNOSIS — G8929 Other chronic pain: Secondary | ICD-10-CM

## 2018-07-29 DIAGNOSIS — M62830 Muscle spasm of back: Secondary | ICD-10-CM

## 2018-07-29 NOTE — Addendum Note (Signed)
Addended by: Blain Pais on: 07/29/2018 03:34 PM   Modules accepted: Orders

## 2018-07-29 NOTE — Therapy (Signed)
[place] [facility] [address] [place], [address] Phone: [phone]   Fax:  [phone]  Physical Therapy Treatment  Patient Details  Name: [name] [name] MRN: [id number] Date of Birth: 1940/12/13 Referring Provider (PT): Amy Diona Browner   Encounter Date: 07/29/2018  PT End of Session - 07/29/18 1515    Visit Number  20    Number of Visits  21    Date for PT Re-Evaluation  07/30/18    Authorization Type  10/ 10 G Code    PT Start Time  1115    PT Stop Time  1200    PT Time Calculation (min)  45 min    Activity Tolerance  Patient tolerated treatment well    Behavior During Therapy  [facility] for tasks assessed/performed       Past Medical History:  Diagnosis Date  . Arthritis   . Chronic lower back pain   . Diverticulitis   . GERD (gastroesophageal reflux disease)   . Hemorrhoids, internal, with bleeding   . Hiatal hernia   . History of colon polyps   . HLD (hyperlipidemia)   . Hypertension   . OSA (obstructive sleep apnea)   . PUD (peptic ulcer disease)   . Scoliosis   . Thyroid disease    hypo  . Thyroid nodule   . Vaginitis, atrophic   . Vertigo     Past Surgical History:  Procedure Laterality Date  . ANKLE FUSION  1967   LEFT  . CHOLECYSTECTOMY     2004  . COLONOSCOPY  06/22/2011   diverticulosis (no adenomas since 2001)  . FRACTURE SURGERY Left 1967   foot and ankle surgery    There were no vitals filed for this visit.  Subjective Assessment - 07/29/18 1514    Subjective  Patient states improvement with pain and spasms and reports she is able to work a lot more since the start of therapy.     Pertinent History  ankle fusion on the R side, lumbar scoliosis, menieres disease    Limitations  Lifting;Walking;Standing    How long can you walk comfortably?  Immediate pain    Patient Stated Goals  To decrease pain    Currently in Pain?  No/denies    Pain Onset  More than a month ago        TREATMENT  Therapeutic Exercise Standing hip abduction at hip machine - x20 25# Standing hip extension at hip machine - x 20 25# Straight chops with black tubing - x 10 B Side stepping with GTB - x15 down and back  Low row with GTB - x 10  Standing hip ER - with RTB around some knees - x 20  Hip sliders with running man with UE support - x 20  Hip sliders with abduction with UE support - x 20 Nustep level 4 with seat level 9 - x6 min   Patient demonstrates increased fatigue at the end of the session   PT Education - 07/29/18 1514    Education Details  form/technique with exercise    Person(s) Educated  Patient    Methods  Explanation;Demonstration    Comprehension  Verbalized understanding;Returned demonstration          PT Long Term Goals - 07/29/18 1528      PT LONG TERM GOAL #1   Title  Patient will be independent with exercise performance and progression to continue benefits of therapy after discharge  Baseline  form/technique with exercise; 06/17/2018: Moderate cueing on form/technique; 07/29/2018: minimal cueing to perform    Time  6    Period  Weeks    Status  On-going      PT LONG TERM GOAL #2   Title  Patient will improve 21mwt to over 75m/s to indicate a decrease in fall risk with performing community ambulation    Baseline  .71 m/s; 06/17/2018: .81m/s; 07/29/2018: .28m/s    Time  6    Period  Weeks    Status  On-going      PT LONG TERM GOAL #3   Title  Patient will have a worst pain of 3/10 to indicate significant improvement and greater ability to perform standing with less pain allowing for performance of greater amount of ADLs.     Baseline  7/10 worst pain; 06/17/2018: 5/10 worst pain; 07/29/2018: 3/10 worst pain    Time  6    Period  Weeks    Status  Achieved      PT LONG TERM GOAL #4   Title  Patient will be able to ambulate for 1 hour to better walk at the grocery store without having to stop secondary to pain/fatigue.     Baseline  Able to  amb 34min    Time  6    Period  Weeks    Status  New            Plan - 07/29/18 1515    Clinical Impression Statement  Patient is making progress towards long term goals with ability to perform greater amount of exercise, able to walk for for longer periods of time and is overall having less pain compared to the previous sessions. Although patient is improving, she continues to have increased difficulty with walking for prolonged periods of time. Patient will benefit from further skilled therapy to return to prior level of function.     Clinical Impairments Affecting Rehab Potential  (+) highly motivated, active lifestyle (-) chronicity, age    PT Frequency  2x / week    PT Duration  6 weeks    PT Treatment/Interventions  Gait training;Stair training;Functional mobility training;Therapeutic activities;Therapeutic exercise;Balance training;Electrical Stimulation;Cryotherapy;Iontophoresis 4mg /ml Dexamethasone;Moist Heat;Traction;Patient/family education;Manual techniques;Neuromuscular re-education;Passive range of motion;Dry needling    PT Next Visit Plan  progress exercise performance and technique    PT Home Exercise Plan  See education section(s)    Consulted and Agree with Plan of Care  Patient       Patient will benefit from skilled therapeutic intervention in order to improve the following deficits and impairments:  Decreased endurance, Decreased activity tolerance, Decreased balance, Pain, Decreased range of motion, Decreased strength, Difficulty walking, Abnormal gait, Increased fascial restricitons, Decreased coordination, Increased muscle spasms  Visit Diagnosis: Chronic right-sided low back pain without sciatica  Muscle weakness (generalized)  Muscle spasm of back     Problem List Patient Active Problem List   Diagnosis Date Noted  . Hyponatremia 01/08/2018  . Meniere's disease 01/08/2018  . 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 Lower extremity  edema 08/09/2017  . Gait instability 08/09/2017  . Choking sensation 06/21/2017  . Inclusion cyst of vulva 05/14/2017  . Spasm of thoracic back muscle 05/11/2017  . Vulvar lesion 03/05/2017  . Situational anxiety 02/09/2017  . Puncture wound 01/05/2017  . Chronic insomnia 01/02/2017  . Counseling regarding end of life decision making 12/18/2014  . Raynaud phenomenon 08/31/2014  . Chronic allergic conjunctivitis 03/15/2014  . PAD (peripheral  artery disease) ([place]) 03/05/2013  . Chronic back pain greater than 3 months duration 08/13/2012  . S/P ankle fusion 02/06/2011  . TEMPOROMANDIBULAR JOINT PAIN 05/20/2010  . Carotid stenosis 06/30/2009  . Multiple thyroid nodules 03/15/2009  . Obstructive sleep apnea 03/05/2009  . HEMORRHOIDS, INTERNAL, WITH BLEEDING 05/09/2007  . VERTIGO 12/18/2006  . COLONIC POLYPS 12/17/2006  . HYPERCHOLESTEROLEMIA 12/17/2006  . Essential hypertension 12/17/2006  . DIVERTICULAR DISEASE 12/17/2006  . VAGINITIS, ATROPHIC 12/17/2006  . Osteoarthritis, generalized 12/17/2006  . SCOLIOSIS 12/17/2006  . Seasonal and perennial allergic rhinitis 12/17/2006    Blythe Stanford, PT DPT 07/29/2018, 3:32 PM  [place] [facility] [address] [place], [address] Phone: [phone]   Fax:  [phone]  Name: [name] [name] MRN: [id number] Date of Birth: 01-Jun-1941

## 2018-07-30 ENCOUNTER — Other Ambulatory Visit: Payer: Self-pay | Admitting: Family Medicine

## 2018-07-30 NOTE — Telephone Encounter (Signed)
Last office visit 07/12/218.  Last refilled 06/14/2018 for #60 with no refills. Next Appt:  01/14/2019 for 6 month follow up.

## 2018-07-31 ENCOUNTER — Ambulatory Visit: Payer: Medicare Other

## 2018-08-05 ENCOUNTER — Ambulatory Visit: Payer: Medicare Other | Attending: Family Medicine

## 2018-08-05 DIAGNOSIS — M62830 Muscle spasm of back: Secondary | ICD-10-CM | POA: Diagnosis present

## 2018-08-05 DIAGNOSIS — M6281 Muscle weakness (generalized): Secondary | ICD-10-CM

## 2018-08-05 DIAGNOSIS — M545 Low back pain: Secondary | ICD-10-CM | POA: Insufficient documentation

## 2018-08-05 DIAGNOSIS — G8929 Other chronic pain: Secondary | ICD-10-CM | POA: Diagnosis present

## 2018-08-05 NOTE — Therapy (Signed)
[place] [facility] [address] [place], [address] Phone: [phone]   Fax:  [phone]  Physical Therapy Treatment  Patient Details  Name: [name] [name] MRN: [id number] Date of Birth: [birth year] Referring Provider (PT): Amy Diona Browner   Encounter Date: 08/05/2018  PT End of Session - 08/05/18 1030    Visit Number  21    Number of Visits  33    Date for PT Re-Evaluation  08/26/18    Authorization Type   / 10 G Code    PT Start Time  6962    PT Stop Time  1119    PT Time Calculation (min)  48 min    Activity Tolerance  Patient tolerated treatment well    Behavior During Therapy  [facility] for tasks assessed/performed       Past Medical History:  Diagnosis Date  . Arthritis   . Chronic lower back pain   . Diverticulitis   . GERD (gastroesophageal reflux disease)   . Hemorrhoids, internal, with bleeding   . Hiatal hernia   . History of colon polyps   . HLD (hyperlipidemia)   . Hypertension   . OSA (obstructive sleep apnea)   . PUD (peptic ulcer disease)   . Scoliosis   . Thyroid disease    hypo  . Thyroid nodule   . Vaginitis, atrophic   . Vertigo     Past Surgical History:  Procedure Laterality Date  . ANKLE FUSION  1967   LEFT  . CHOLECYSTECTOMY     2004  . COLONOSCOPY  06/22/2011   diverticulosis (no adenomas since 2001)  . FRACTURE SURGERY Left 1967   foot and ankle surgery    There were no vitals filed for this visit.  Subjective Assessment - 08/05/18 1032    Subjective  Has back pain in L side of low back. Balance is a little bit. Also did some traveling and slept on a hard bed which bothered her back. Has some relieve with heat.   Traveling this Friday via [place] to [place].     Pertinent History  ankle fusion on the R side, lumbar scoliosis, menieres disease    Limitations  Lifting;Walking;Standing    How long can you walk comfortably?  Immediate pain    Patient Stated Goals  To  decrease pain    Currently in Pain?  Yes    Pain Score  3     Pain Onset  More than a month ago                               PT Education - 08/05/18 1051    Education Details  ther-ex    [employer]) Educated  Patient    Methods  Explanation;Demonstration;Tactile cues;Verbal cues    Comprehension  Returned demonstration;Verbalized understanding          Objectives    TTP L glute max area  Therapeutic Exercise  Low row with GTB - 2 x 10  Hip sliders with abduction with UE support - x 20, pain free range Standing hip extension at hip machine - x 20 25# Standing hip abduction at hip machine - x20 25# (2x10 for R LE)  Sit to stand squats with UE support - 2x 10 with a chair behind  Side stepping with GTB - x15 down and back  Walking backwards with GTB around knees - 2 x 74ft Seated  T-band scapular retraction red band 10x3 standing straight pallof press resisting double yellow band 10x5 seconds for 2 sets Nustep level 4 with seat level 9 - x6 min  Improved exercise technique, movement at target joints, use of target muscles after min to mod verbal, visual, tactile cues.    Decreased back pain with exercises per pt. Continued working on glute and trunk strengthening to help continue progress. Pt will benefit from continued skilled physical therapy services to decrease pain and improve function.         PT Long Term Goals - 07/29/18 1528      PT LONG TERM GOAL #1   Title  Patient will be independent with exercise performance and progression to continue benefits of therapy after discharge    Baseline  form/technique with exercise; 06/17/2018: Moderate cueing on form/technique; 07/29/2018: minimal cueing to perform    Time  6    Period  Weeks    Status  On-going      PT LONG TERM GOAL #2   Title  Patient will improve 61mwt to over 53m/s to indicate a decrease in fall risk with performing community ambulation    Baseline  .71 m/s; 06/17/2018:  .31m/s; 07/29/2018: .31m/s    Time  6    Period  Weeks    Status  On-going      PT LONG TERM GOAL #3   Title  Patient will have a worst pain of 3/10 to indicate significant improvement and greater ability to perform standing with less pain allowing for performance of greater amount of ADLs.     Baseline  7/10 worst pain; 06/17/2018: 5/10 worst pain; 07/29/2018: 3/10 worst pain    Time  6    Period  Weeks    Status  Achieved      PT LONG TERM GOAL #4   Title  Patient will be able to ambulate for 1 hour to better walk at the grocery store without having to stop secondary to pain/fatigue.     Baseline  Able to amb 39min    Time  6    Period  Weeks    Status  New            Plan - 08/05/18 1028    Clinical Impression Statement  Decreased back pain with exercises per pt. Continued working on glute and trunk strengthening to help continue progress. Pt will benefit from continued skilled physical therapy services to decrease pain and improve function.     Clinical Impairments Affecting Rehab Potential  (+) highly motivated, active lifestyle (-) chronicity, age    PT Frequency  2x / week    PT Duration  6 weeks    PT Treatment/Interventions  Gait training;Stair training;Functional mobility training;Therapeutic activities;Therapeutic exercise;Balance training;Electrical Stimulation;Cryotherapy;Iontophoresis 4mg /ml Dexamethasone;Moist Heat;Traction;Patient/family education;Manual techniques;Neuromuscular re-education;Passive range of motion;Dry needling    PT Next Visit Plan  progress exercise performance and technique    PT Home Exercise Plan  See education section(s)    Consulted and Agree with Plan of Care  Patient       Patient will benefit from skilled therapeutic intervention in order to improve the following deficits and impairments:  Decreased endurance, Decreased activity tolerance, Decreased balance, Pain, Decreased range of motion, Decreased strength, Difficulty walking,  Abnormal gait, Increased fascial restricitons, Decreased coordination, Increased muscle spasms  Visit Diagnosis: Chronic right-sided low back pain without sciatica  Muscle weakness (generalized)  Muscle spasm of back     Problem List Patient Active Problem  List   Diagnosis Date Noted  . Hyponatremia 01/08/2018  . Meniere's disease 01/08/2018  . 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 Lower extremity edema 08/09/2017  . Gait instability 08/09/2017  . Choking sensation 06/21/2017  . Inclusion cyst of vulva 05/14/2017  . Spasm of thoracic back muscle 05/11/2017  . Vulvar lesion 03/05/2017  . Situational anxiety 02/09/2017  . Puncture wound 01/05/2017  . Chronic insomnia 01/02/2017  . Counseling regarding end of life decision making 12/18/2014  . Raynaud phenomenon 08/31/2014  . Chronic allergic conjunctivitis 03/15/2014  . PAD (peripheral artery disease) ([place]) 03/05/2013  . Chronic back pain greater than 3 months duration 08/13/2012  . S/P ankle fusion 02/06/2011  . TEMPOROMANDIBULAR JOINT PAIN 05/20/2010  . Carotid stenosis 06/30/2009  . Multiple thyroid nodules 03/15/2009  . Obstructive sleep apnea 03/05/2009  . HEMORRHOIDS, INTERNAL, WITH BLEEDING 05/09/2007  . VERTIGO 12/18/2006  . COLONIC POLYPS 12/17/2006  . HYPERCHOLESTEROLEMIA 12/17/2006  . Essential hypertension 12/17/2006  . DIVERTICULAR DISEASE 12/17/2006  . VAGINITIS, ATROPHIC 12/17/2006  . Osteoarthritis, generalized 12/17/2006  . SCOLIOSIS 12/17/2006  . Seasonal and perennial allergic rhinitis 12/17/2006    Joneen Boers PT, DPT   08/05/2018, 12:39 PM  [facility] [facility] [address] [place], [address] Phone: [phone]   Fax:  [phone]  Name: [name] [name] MRN: [id number] Date of Birth: 06-03-1941

## 2018-08-07 ENCOUNTER — Ambulatory Visit: Payer: Medicare Other

## 2018-08-07 DIAGNOSIS — M545 Low back pain, unspecified: Secondary | ICD-10-CM

## 2018-08-07 DIAGNOSIS — M6281 Muscle weakness (generalized): Secondary | ICD-10-CM

## 2018-08-07 DIAGNOSIS — M62830 Muscle spasm of back: Secondary | ICD-10-CM

## 2018-08-07 DIAGNOSIS — G8929 Other chronic pain: Secondary | ICD-10-CM

## 2018-08-07 NOTE — Therapy (Signed)
[place] [facility] [address] [place], [address] Phone: [phone]   Fax:  [phone]  Physical Therapy Treatment  Patient Details  Name: [name] [name] MRN: [id number] Date of Birth: 10-14-1940 Referring Provider (PT): Amy Diona Browner   Encounter Date: 08/07/2018  PT End of Session - 08/07/18 1123    Visit Number  22    Number of Visits  33    Date for PT Re-Evaluation  08/26/18    Authorization Type   / 10 G Code    PT Start Time  2774    PT Stop Time  1211    PT Time Calculation (min)  47 min    Activity Tolerance  Patient tolerated treatment well    Behavior During Therapy  [facility] for tasks assessed/performed       Past Medical History:  Diagnosis Date  . Arthritis   . Chronic lower back pain   . Diverticulitis   . GERD (gastroesophageal reflux disease)   . Hemorrhoids, internal, with bleeding   . Hiatal hernia   . History of colon polyps   . HLD (hyperlipidemia)   . Hypertension   . OSA (obstructive sleep apnea)   . PUD (peptic ulcer disease)   . Scoliosis   . Thyroid disease    hypo  . Thyroid nodule   . Vaginitis, atrophic   . Vertigo     Past Surgical History:  Procedure Laterality Date  . ANKLE FUSION  1967   LEFT  . CHOLECYSTECTOMY     2004  . COLONOSCOPY  06/22/2011   diverticulosis (no adenomas since 2001)  . FRACTURE SURGERY Left 1967   foot and ankle surgery    There were no vitals filed for this visit.  Subjective Assessment - 08/07/18 1125    Subjective  Was doing great after last session. Had to sit on a dentist chair for about 2.5 hours working on her crown. Back bothered her afterwards. Was hard to get her back straight yesterday. Has gone away yesterday.     Pertinent History  ankle fusion on the R side, lumbar scoliosis, menieres disease    Limitations  Lifting;Walking;Standing    How long can you walk comfortably?  Immediate pain    Patient Stated Goals  To decrease  pain    Currently in Pain?  Yes    Pain Score  2    R low back pain   Pain Onset  More than a month ago                               PT Education - 08/07/18 1152    Education Details  ther-ex    Person(s) Educated  Patient    Methods  Explanation;Demonstration;Tactile cues;Verbal cues    Comprehension  Returned demonstration;Verbalized understanding         Objectives    TTP L glute max area  Therapeutic Exercise  Low row with GTB - 2 x 10  Straight chops with black tubing - x 10 B Hip sliders with running man with UE support - x 20  Hip sliders with abduction with UE support - x 20, pain free range standing straight pallof press resisting double yellow band 10x5 seconds for 3 sets Standing hip extension at hip machine - x 20 25# Standing hip abduction at hip machine - x20 25# Sit to stand squats with UE support -  2x 10 with a chair behind  Side stepping with GTB - x20 down and back  Walking backwards with GTB around knees - 2 x 20 ft Seated T-band scapular retraction red band 10x3 with 5 second holds   Nustep level 4 with seat level 9 - x6 min  Improved exercise technique, movement at target joints, use of target muscles after min to mod verbal, visual, tactile cues.  Able to perform exercises with minimal sitting rest breaks suggesting good functional carryover. No complain of increased pain throughout session. Continued working on trunk, and glute strengthening to help continue progress. Pt will benefit from continued skilled physical therapy services to decrease pain and improve function.             PT Long Term Goals - 07/29/18 1528      PT LONG TERM GOAL #1   Title  Patient will be independent with exercise performance and progression to continue benefits of therapy after discharge    Baseline  form/technique with exercise; 06/17/2018: Moderate cueing on form/technique; 07/29/2018: minimal cueing to perform    Time  6     Period  Weeks    Status  On-going      PT LONG TERM GOAL #2   Title  Patient will improve 24mwt to over 43m/s to indicate a decrease in fall risk with performing community ambulation    Baseline  .71 m/s; 06/17/2018: .96m/s; 07/29/2018: .43m/s    Time  6    Period  Weeks    Status  On-going      PT LONG TERM GOAL #3   Title  Patient will have a worst pain of 3/10 to indicate significant improvement and greater ability to perform standing with less pain allowing for performance of greater amount of ADLs.     Baseline  7/10 worst pain; 06/17/2018: 5/10 worst pain; 07/29/2018: 3/10 worst pain    Time  6    Period  Weeks    Status  Achieved      PT LONG TERM GOAL #4   Title  Patient will be able to ambulate for 1 hour to better walk at the grocery store without having to stop secondary to pain/fatigue.     Baseline  Able to amb 91min    Time  6    Period  Weeks    Status  New            Plan - 08/07/18 1121    Clinical Impression Statement  Able to perform exercises with minimal sitting rest breaks suggesting good functional carryover. No complain of increased pain throughout session. Continued working on trunk, and glute strengthening to help continue progress. Pt will benefit from continued skilled physical therapy services to decrease pain and improve function.     Clinical Impairments Affecting Rehab Potential  (+) highly motivated, active lifestyle (-) chronicity, age    PT Frequency  2x / week    PT Duration  6 weeks    PT Treatment/Interventions  Gait training;Stair training;Functional mobility training;Therapeutic activities;Therapeutic exercise;Balance training;Electrical Stimulation;Cryotherapy;Iontophoresis 4mg /ml Dexamethasone;Moist Heat;Traction;Patient/family education;Manual techniques;Neuromuscular re-education;Passive range of motion;Dry needling    PT Next Visit Plan  progress exercise performance and technique    PT Home Exercise Plan  See education section(s)     Consulted and Agree with Plan of Care  Patient       Patient will benefit from skilled therapeutic intervention in order to improve the following deficits and impairments:  Decreased endurance, Decreased activity  tolerance, Decreased balance, Pain, Decreased range of motion, Decreased strength, Difficulty walking, Abnormal gait, Increased fascial restricitons, Decreased coordination, Increased muscle spasms  Visit Diagnosis: Chronic right-sided low back pain without sciatica  Muscle weakness (generalized)  Muscle spasm of back     Problem List Patient Active Problem List   Diagnosis Date Noted  . Hyponatremia 01/08/2018  . Meniere's disease 01/08/2018  . 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 Lower extremity edema 08/09/2017  . Gait instability 08/09/2017  . Choking sensation 06/21/2017  . Inclusion cyst of vulva 05/14/2017  . Spasm of thoracic back muscle 05/11/2017  . Vulvar lesion 03/05/2017  . Situational anxiety 02/09/2017  . Puncture wound 01/05/2017  . Chronic insomnia 01/02/2017  . Counseling regarding end of life decision making 12/18/2014  . Raynaud phenomenon 08/31/2014  . Chronic allergic conjunctivitis 03/15/2014  . PAD (peripheral artery disease) ([place]) 03/05/2013  . Chronic back pain greater than 3 months duration 08/13/2012  . S/P ankle fusion 02/06/2011  . TEMPOROMANDIBULAR JOINT PAIN 05/20/2010  . Carotid stenosis 06/30/2009  . Multiple thyroid nodules 03/15/2009  . Obstructive sleep apnea 03/05/2009  . HEMORRHOIDS, INTERNAL, WITH BLEEDING 05/09/2007  . VERTIGO 12/18/2006  . COLONIC POLYPS 12/17/2006  . HYPERCHOLESTEROLEMIA 12/17/2006  . Essential hypertension 12/17/2006  . DIVERTICULAR DISEASE 12/17/2006  . VAGINITIS, ATROPHIC 12/17/2006  . Osteoarthritis, generalized 12/17/2006  . SCOLIOSIS 12/17/2006  . Seasonal and perennial allergic rhinitis 12/17/2006   Joneen Boers PT, DPT   08/07/2018, 12:46 PM  [place] [address] [place], [address] Phone: [phone]   Fax:  [phone]  Name: [name] [name] MRN: [id number] Date of Birth: October 15, 1940

## 2018-08-08 ENCOUNTER — Ambulatory Visit: Payer: Medicare Other | Admitting: Physical Therapy

## 2018-08-08 NOTE — Addendum Note (Signed)
Addended by: Blain Pais on: 08/08/2018 01:47 PM   Modules accepted: Orders

## 2018-08-20 ENCOUNTER — Ambulatory Visit: Payer: Medicare Other

## 2018-08-20 DIAGNOSIS — M545 Low back pain: Secondary | ICD-10-CM | POA: Diagnosis not present

## 2018-08-20 DIAGNOSIS — G8929 Other chronic pain: Secondary | ICD-10-CM

## 2018-08-20 DIAGNOSIS — M6281 Muscle weakness (generalized): Secondary | ICD-10-CM

## 2018-08-20 DIAGNOSIS — M62830 Muscle spasm of back: Secondary | ICD-10-CM

## 2018-08-20 NOTE — Therapy (Signed)
[place] [facility] [address] [place], [address] Phone: [phone]   Fax:  [phone]  Physical Therapy Treatment  Patient Details  Name: [name] [name] MRN: [id number] Date of Birth: 14-Aug-1941 Referring Provider (PT): Amy Diona Browner   Encounter Date: 08/20/2018  PT End of Session - 08/20/18 1049    Visit Number  23    Number of Visits  33    Date for PT Re-Evaluation  08/26/18    Authorization Type   3/ 10 G Code    PT Start Time  1030    PT Stop Time  1115    PT Time Calculation (min)  45 min    Activity Tolerance  Patient tolerated treatment well    Behavior During Therapy  [facility] for tasks assessed/performed       Past Medical History:  Diagnosis Date  . Arthritis   . Chronic lower back pain   . Diverticulitis   . GERD (gastroesophageal reflux disease)   . Hemorrhoids, internal, with bleeding   . Hiatal hernia   . History of colon polyps   . HLD (hyperlipidemia)   . Hypertension   . OSA (obstructive sleep apnea)   . PUD (peptic ulcer disease)   . Scoliosis   . Thyroid disease    hypo  . Thyroid nodule   . Vaginitis, atrophic   . Vertigo     Past Surgical History:  Procedure Laterality Date  . ANKLE FUSION  1967   LEFT  . CHOLECYSTECTOMY     2004  . COLONOSCOPY  06/22/2011   diverticulosis (no adenomas since 2001)  . FRACTURE SURGERY Left 1967   foot and ankle surgery    There were no vitals filed for this visit.  Subjective Assessment - 08/20/18 1043    Subjective  Patient reports she picked something up from the floor this morning and aggravated her back pain. Patient states she was able to ride the train without pain.     Pertinent History  ankle fusion on the R side, lumbar scoliosis, menieres disease    Limitations  Lifting;Walking;Standing    How long can you walk comfortably?  Immediate pain    Patient Stated Goals  To decrease pain    Currently in Pain?  Yes    Pain Score   5     Pain Location  Back    Pain Orientation  Lower    Pain Descriptors / Indicators  Aching    Pain Type  Chronic pain    Pain Onset  More than a month ago    Pain Frequency  Intermittent         TREATMENT  Therapeutic Exercise Prayer stretch in sitting with clear physioball - x 20  Lumbar rotation with PVC pipe behind back - x 20 Lumbar rotation with PVC with extension - x 20  Lumbar side bending in standing - x 20 in doorway Lumbar extension in standing-x 20  Hip abduction/extension in standing - 15 x 2 with UE support Standing hip extension at hip machine - x 20 25#   Patient demonstrates decreased pain at the end of the session    PT Long Term Goals - 07/29/18 1528      PT LONG TERM GOAL #1   Title  Patient will be independent with exercise performance and progression to continue benefits of therapy after discharge    Baseline  form/technique with exercise; 06/17/2018: Moderate cueing on form/technique; 07/29/2018:  minimal cueing to perform    Time  6    Period  Weeks    Status  On-going      PT LONG TERM GOAL #2   Title  Patient will improve 93mwt to over 67m/s to indicate a decrease in fall risk with performing community ambulation    Baseline  .71 m/s; 06/17/2018: .37m/s; 07/29/2018: .100m/s    Time  6    Period  Weeks    Status  On-going      PT LONG TERM GOAL #3   Title  Patient will have a worst pain of 3/10 to indicate significant improvement and greater ability to perform standing with less pain allowing for performance of greater amount of ADLs.     Baseline  7/10 worst pain; 06/17/2018: 5/10 worst pain; 07/29/2018: 3/10 worst pain    Time  6    Period  Weeks    Status  Achieved      PT LONG TERM GOAL #4   Title  Patient will be able to ambulate for 1 hour to better walk at the grocery store without having to stop secondary to pain/fatigue.     Baseline  Able to amb 109min    Time  6    Period  Weeks    Status  New            Plan - 08/20/18  1058    Clinical Impression Statement  Performed greater amount of mobility based exercises today as patient demonstrates increased pain most likely from muscle guarding. Patient demonstrates improvement with pain after performing exercises indicating improvement in guarding. Patient wil benefit from further skilled therapy to return to prior level of function.     Clinical Impairments Affecting Rehab Potential  (+) highly motivated, active lifestyle (-) chronicity, age    PT Frequency  2x / week    PT Duration  6 weeks    PT Treatment/Interventions  Gait training;Stair training;Functional mobility training;Therapeutic activities;Therapeutic exercise;Balance training;Electrical Stimulation;Cryotherapy;Iontophoresis 4mg /ml Dexamethasone;Moist Heat;Traction;Patient/family education;Manual techniques;Neuromuscular re-education;Passive range of motion;Dry needling    PT Next Visit Plan  progress exercise performance and technique    PT Home Exercise Plan  See education section(s)    Consulted and Agree with Plan of Care  Patient       Patient will benefit from skilled therapeutic intervention in order to improve the following deficits and impairments:  Decreased endurance, Decreased activity tolerance, Decreased balance, Pain, Decreased range of motion, Decreased strength, Difficulty walking, Abnormal gait, Increased fascial restricitons, Decreased coordination, Increased muscle spasms  Visit Diagnosis: Chronic right-sided low back pain without sciatica  Muscle weakness (generalized)  Muscle spasm of back     Problem List Patient Active Problem List   Diagnosis Date Noted  . Hyponatremia 01/08/2018  . Meniere's disease 01/08/2018  . 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 Lower extremity edema 08/09/2017  . Gait instability 08/09/2017  . Choking sensation 06/21/2017  . Inclusion cyst of vulva 05/14/2017  . Spasm of thoracic back muscle 05/11/2017  . Vulvar lesion  03/05/2017  . Situational anxiety 02/09/2017  . Puncture wound 01/05/2017  . Chronic insomnia 01/02/2017  . Counseling regarding end of life decision making 12/18/2014  . Raynaud phenomenon 08/31/2014  . Chronic allergic conjunctivitis 03/15/2014  . PAD (peripheral artery disease) ([place]) 03/05/2013  . Chronic back pain greater than 3 months duration 08/13/2012  . S/P ankle fusion 02/06/2011  . TEMPOROMANDIBULAR JOINT PAIN 05/20/2010  . Carotid stenosis 06/30/2009  .  Multiple thyroid nodules 03/15/2009  . Obstructive sleep apnea 03/05/2009  . HEMORRHOIDS, INTERNAL, WITH BLEEDING 05/09/2007  . VERTIGO 12/18/2006  . COLONIC POLYPS 12/17/2006  . HYPERCHOLESTEROLEMIA 12/17/2006  . Essential hypertension 12/17/2006  . DIVERTICULAR DISEASE 12/17/2006  . VAGINITIS, ATROPHIC 12/17/2006  . Osteoarthritis, generalized 12/17/2006  . SCOLIOSIS 12/17/2006  . Seasonal and perennial allergic rhinitis 12/17/2006    Blythe Stanford, PT DPT 08/20/2018, 11:20 AM  [place] [facility] [address], [place], [address] Phone: [phone]   Fax:  [phone]  Name: Emmie Frakes MRN: [id number] Date of Birth: 1941-07-17

## 2018-08-22 ENCOUNTER — Ambulatory Visit: Payer: Medicare Other

## 2018-08-22 DIAGNOSIS — M6281 Muscle weakness (generalized): Secondary | ICD-10-CM

## 2018-08-22 DIAGNOSIS — M545 Low back pain, unspecified: Secondary | ICD-10-CM

## 2018-08-22 DIAGNOSIS — G8929 Other chronic pain: Secondary | ICD-10-CM

## 2018-08-22 DIAGNOSIS — M62830 Muscle spasm of back: Secondary | ICD-10-CM

## 2018-08-22 NOTE — Therapy (Signed)
[facility] [address] [place], [address] Phone: [phone]   Fax:  [phone]  Physical Therapy Treatment  Patient Details  Name: [name] [name] MRN: [id number] Date of Birth: 08-Jul-1941 Referring Provider (PT): Amy Diona Browner   Encounter Date: 08/22/2018  PT End of Session - 08/22/18 1037    Visit Number  24    Number of Visits  33    Date for PT Re-Evaluation  08/26/18    Authorization Type   4/ 10 G Code    PT Start Time  1030    PT Stop Time  1120    PT Time Calculation (min)  50 min    Activity Tolerance  Patient tolerated treatment well    Behavior During Therapy  [facility] for tasks assessed/performed       Past Medical History:  Diagnosis Date  . Arthritis   . Chronic lower back pain   . Diverticulitis   . GERD (gastroesophageal reflux disease)   . Hemorrhoids, internal, with bleeding   . Hiatal hernia   . History of colon polyps   . HLD (hyperlipidemia)   . Hypertension   . OSA (obstructive sleep apnea)   . PUD (peptic ulcer disease)   . Scoliosis   . Thyroid disease    hypo  . Thyroid nodule   . Vaginitis, atrophic   . Vertigo     Past Surgical History:  Procedure Laterality Date  . ANKLE FUSION  1967   LEFT  . CHOLECYSTECTOMY     2004  . COLONOSCOPY  06/22/2011   diverticulosis (no adenomas since 2001)  . FRACTURE SURGERY Left 1967   foot and ankle surgery    There were no vitals filed for this visit.  Subjective Assessment - 08/22/18 1037    Subjective  Pt stated that she is feeling better since therapy appointment last week.  She feels the ther ex has been helping.    Pertinent History  ankle fusion on the R side, lumbar scoliosis, menieres disease    Limitations  Lifting;Walking;Standing    How long can you walk comfortably?  Immediate pain    Patient Stated Goals  To decrease pain    Currently in Pain?  No/denies    Pain Onset  More than a month ago          Therapeutic Exercise  Seated Seated prayer stretch with physioball x20 NuStep R: 3, seat: 8, grips: 8, 5 min, ave of 30 rpm   Standing  Trunk rotation with blue theraband x30 Shoulder flexion overhead with pink ball x20 Blue foam lateral steps/floor taps handrail for support (BUE) x30 Hip abduction standing on blue foam with use of handrail for support (BUE) x10 Hip extension with yellow theraband x20 Partial squat with bilateral UE support at handrail x20 Lumbar rotation with PVC bar behind back x20 Hip extension hip machine (BLE) 25 lbs, x20  Hip abduction hip machine (BLE) 25 lbs x20   Patient demonstrates no increase in pain at end of the session        PT Long Term Goals - 07/29/18 1528      PT LONG TERM GOAL #1   Title  Patient will be independent with exercise performance and progression to continue benefits of therapy after discharge    Baseline  form/technique with exercise; 06/17/2018: Moderate cueing on form/technique; 07/29/2018: minimal cueing to perform    Time  6    Period  Weeks  Status  On-going      PT LONG TERM GOAL #2   Title  Patient will improve 56mwt to over 58m/s to indicate a decrease in fall risk with performing community ambulation    Baseline  .71 m/s; 06/17/2018: .67m/s; 07/29/2018: .17m/s    Time  6    Period  Weeks    Status  On-going      PT LONG TERM GOAL #3   Title  Patient will have a worst pain of 3/10 to indicate significant improvement and greater ability to perform standing with less pain allowing for performance of greater amount of ADLs.     Baseline  7/10 worst pain; 06/17/2018: 5/10 worst pain; 07/29/2018: 3/10 worst pain    Time  6    Period  Weeks    Status  Achieved      PT LONG TERM GOAL #4   Title  Patient will be able to ambulate for 1 hour to better walk at the grocery store without having to stop secondary to pain/fatigue.     Baseline  Able to amb 25min    Time  6    Period  Weeks    Status  New             Plan - 08/22/18 1403    Clinical Impression Statement  Pt was able to complete all there ex without report of pain increase and with need for min VC's for form.  PT noted increased hip ROM and improved posture during standing hip abduction.  Pt was able to add resistance with yellow theraband during hip abduction without increase in pain. Patient continues to have pain with prolonged standing and will benefit from further skilled therapy to return to prior level of function.     Clinical Impairments Affecting Rehab Potential  (+) highly motivated, active lifestyle (-) chronicity, age    PT Frequency  2x / week    PT Duration  6 weeks    PT Treatment/Interventions  Gait training;Stair training;Functional mobility training;Therapeutic activities;Therapeutic exercise;Balance training;Electrical Stimulation;Cryotherapy;Iontophoresis 4mg /ml Dexamethasone;Moist Heat;Traction;Patient/family education;Manual techniques;Neuromuscular re-education;Passive range of motion;Dry needling    PT Next Visit Plan  progress exercise performance and technique    PT Home Exercise Plan  See education section(s)    Consulted and Agree with Plan of Care  Patient       Patient will benefit from skilled therapeutic intervention in order to improve the following deficits and impairments:  Decreased endurance, Decreased activity tolerance, Decreased balance, Pain, Decreased range of motion, Decreased strength, Difficulty walking, Abnormal gait, Increased fascial restricitons, Decreased coordination, Increased muscle spasms  Visit Diagnosis: Chronic right-sided low back pain without sciatica  Muscle weakness (generalized)  Muscle spasm of back     Problem List Patient Active Problem List   Diagnosis Date Noted  . Hyponatremia 01/08/2018  . Meniere's disease 01/08/2018  . 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 Lower extremity edema 08/09/2017  . Gait instability 08/09/2017  .  Choking sensation 06/21/2017  . Inclusion cyst of vulva 05/14/2017  . Spasm of thoracic back muscle 05/11/2017  . Vulvar lesion 03/05/2017  . Situational anxiety 02/09/2017  . Puncture wound 01/05/2017  . Chronic insomnia 01/02/2017  . Counseling regarding end of life decision making 12/18/2014  . Raynaud phenomenon 08/31/2014  . Chronic allergic conjunctivitis 03/15/2014  . PAD (peripheral artery disease) ([place]) 03/05/2013  . Chronic back pain greater than 3 months duration 08/13/2012  . S/P ankle fusion 02/06/2011  . TEMPOROMANDIBULAR  JOINT PAIN 05/20/2010  . Carotid stenosis 06/30/2009  . Multiple thyroid nodules 03/15/2009  . Obstructive sleep apnea 03/05/2009  . HEMORRHOIDS, INTERNAL, WITH BLEEDING 05/09/2007  . VERTIGO 12/18/2006  . COLONIC POLYPS 12/17/2006  . HYPERCHOLESTEROLEMIA 12/17/2006  . Essential hypertension 12/17/2006  . DIVERTICULAR DISEASE 12/17/2006  . VAGINITIS, ATROPHIC 12/17/2006  . Osteoarthritis, generalized 12/17/2006  . SCOLIOSIS 12/17/2006  . Seasonal and perennial allergic rhinitis 12/17/2006    Blythe Stanford, PT DPT 08/22/2018, 2:03 PM  [place] [facility] [address] [place], [address] Phone: [phone]   Fax:  [phone]  Name: [name] [name] MRN: [id number] Date of Birth: Jun 09, 1941

## 2018-08-24 ENCOUNTER — Other Ambulatory Visit: Payer: Self-pay | Admitting: Family Medicine

## 2018-08-26 ENCOUNTER — Other Ambulatory Visit: Payer: Self-pay | Admitting: Family Medicine

## 2018-08-26 NOTE — Telephone Encounter (Signed)
Last filled 07-30-18 #60 Last OV 07-12-18 Next OV 01-14-19 [facility] [address]

## 2018-09-02 ENCOUNTER — Ambulatory Visit: Payer: Medicare Other

## 2018-09-02 DIAGNOSIS — M62830 Muscle spasm of back: Secondary | ICD-10-CM

## 2018-09-02 DIAGNOSIS — M545 Low back pain, unspecified: Secondary | ICD-10-CM

## 2018-09-02 DIAGNOSIS — G8929 Other chronic pain: Secondary | ICD-10-CM

## 2018-09-02 DIAGNOSIS — M6281 Muscle weakness (generalized): Secondary | ICD-10-CM

## 2018-09-02 NOTE — Addendum Note (Signed)
Addended by: Blythe Stanford V on: 09/02/2018 04:00 PM   Modules accepted: Orders

## 2018-09-02 NOTE — Therapy (Signed)
[place] [facility] [address], [place], [address] Phone: [phone]   Fax:  [phone]  Physical Therapy Treatment  Patient Details  Name: [name] [name] MRN: [id number] Date of Birth: 03-Dec-1940 Referring Provider (PT): Amy Diona Browner   Encounter Date: 09/02/2018  PT End of Session - 09/02/18 1549    Visit Number  25    Number of Visits  33    Date for PT Re-Evaluation  10/14/18    Authorization Type  5/ 10 G Code    PT Start Time  2878    PT Stop Time  1600    PT Time Calculation (min)  45 min    Activity Tolerance  Patient tolerated treatment well    Behavior During Therapy  [facility] for tasks assessed/performed       Past Medical History:  Diagnosis Date  . Arthritis   . Chronic lower back pain   . Diverticulitis   . GERD (gastroesophageal reflux disease)   . Hemorrhoids, internal, with bleeding   . Hiatal hernia   . History of colon polyps   . HLD (hyperlipidemia)   . Hypertension   . OSA (obstructive sleep apnea)   . PUD (peptic ulcer disease)   . Scoliosis   . Thyroid disease    hypo  . Thyroid nodule   . Vaginitis, atrophic   . Vertigo     Past Surgical History:  Procedure Laterality Date  . ANKLE FUSION  1967   LEFT  . CHOLECYSTECTOMY     2004  . COLONOSCOPY  06/22/2011   diverticulosis (no adenomas since 2001)  . FRACTURE SURGERY Left 1967   foot and ankle surgery    There were no vitals filed for this visit.  Subjective Assessment - 09/02/18 1545    Subjective  Patient reports she feels she is improving overall states she "Is starting to feel like she used to." Patient states walking is getting easier.     Pertinent History  ankle fusion on the R side, lumbar scoliosis, menieres disease    Limitations  Lifting;Walking;Standing    How long can you walk comfortably?  Immediate pain    Patient Stated Goals  To decrease pain    Currently in Pain?  No/denies    Pain Onset  More  than a month ago       TREATMENT  Therapeutic Exercise Standing hip extension at hip machine - x 20 25# Standing hip abduction at hip machine - x 20 25# Standing squats with UE support - x 20  Standing lumbar upward rotation with YTB - x 20  Prayer stretch in sitting with clear physioball - x 20  Standing mini lunges with UE support - x 20  Side stepping across YTB - x 10 (2 steps) Nustep in sitting level 2 - 8 min   Patient demonstrates decreased pain at the end of the session   PT Education - 09/02/18 1548    Education Details  form/technique with exercise    Person(s) Educated  Patient    Methods  Explanation;Demonstration    Comprehension  Verbalized understanding;Returned demonstration          PT Long Term Goals - 09/02/18 1524      PT LONG TERM GOAL #1   Title  Patient will be independent with exercise performance and progression to continue benefits of therapy after discharge    Baseline  form/technique with exercise; 06/17/2018: Moderate cueing on form/technique; 07/29/2018:  minimal cueing to perform; 09/02/2018: Independent    Time  6    Period  Weeks    Status  Achieved      PT LONG TERM GOAL #2   Title  Patient will improve 76mwt to over 39m/s to indicate a decrease in fall risk with performing community ambulation    Baseline  .71 m/s; 06/17/2018: .56m/s; 07/29/2018: .69m/s; 09/02/2018: .56m/s    Time  6    Period  Weeks    Status  On-going      PT LONG TERM GOAL #3   Title  Patient will have a worst pain of 3/10 to indicate significant improvement and greater ability to perform standing with less pain allowing for performance of greater amount of ADLs.     Baseline  7/10 worst pain; 06/17/2018: 5/10 worst pain; 07/29/2018: 3/10 worst pain    Time  6    Period  Weeks    Status  Achieved      PT LONG TERM GOAL #4   Title  Patient will be able to ambulate for 1 hour to better walk at the grocery store without having to stop secondary to pain/fatigue.      Baseline  Able to amb 71min; 09/02/2018: Able to slowly perform for 1 hour- normal pace for 10 min    Time  6    Period  Weeks    Status  On-going            Plan - 09/02/18 1553    Clinical Impression Statement  Patient is making progress towards long term goals with improved walking ability in the community and faster walking speed compared to previous sessions. Although patient is improving, she conitnues to demonstrate poor walking and increased pain with prolonged movement. Patient is making progress but continues to demosntrate decreased hiplumbar strength and AROM. Patient will benefit from further skilled therapy to return to prior level of function.     Clinical Impairments Affecting Rehab Potential  (+) highly motivated, active lifestyle (-) chronicity, age    PT Frequency  2x / week    PT Duration  6 weeks    PT Treatment/Interventions  Gait training;Stair training;Functional mobility training;Therapeutic activities;Therapeutic exercise;Balance training;Electrical Stimulation;Cryotherapy;Iontophoresis 4mg /ml Dexamethasone;Moist Heat;Traction;Patient/family education;Manual techniques;Neuromuscular re-education;Passive range of motion;Dry needling    PT Next Visit Plan  progress exercise performance and technique    PT Home Exercise Plan  See education section(s)    Consulted and Agree with Plan of Care  Patient       Patient will benefit from skilled therapeutic intervention in order to improve the following deficits and impairments:  Decreased endurance, Decreased activity tolerance, Decreased balance, Pain, Decreased range of motion, Decreased strength, Difficulty walking, Abnormal gait, Increased fascial restricitons, Decreased coordination, Increased muscle spasms  Visit Diagnosis: Chronic right-sided low back pain without sciatica  Muscle weakness (generalized)  Muscle spasm of back     Problem List Patient Active Problem List   Diagnosis Date Noted  .  Hyponatremia 01/08/2018  . Meniere's disease 01/08/2018  . 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 Lower extremity edema 08/09/2017  . Gait instability 08/09/2017  . Choking sensation 06/21/2017  . Inclusion cyst of vulva 05/14/2017  . Spasm of thoracic back muscle 05/11/2017  . Vulvar lesion 03/05/2017  . Situational anxiety 02/09/2017  . Puncture wound 01/05/2017  . Chronic insomnia 01/02/2017  . Counseling regarding end of life decision making 12/18/2014  . Raynaud phenomenon 08/31/2014  . Chronic allergic conjunctivitis  03/15/2014  . PAD (peripheral artery disease) ([place]) 03/05/2013  . Chronic back pain greater than 3 months duration 08/13/2012  . S/P ankle fusion 02/06/2011  . TEMPOROMANDIBULAR JOINT PAIN 05/20/2010  . Carotid stenosis 06/30/2009  . Multiple thyroid nodules 03/15/2009  . Obstructive sleep apnea 03/05/2009  . HEMORRHOIDS, INTERNAL, WITH BLEEDING 05/09/2007  . VERTIGO 12/18/2006  . COLONIC POLYPS 12/17/2006  . HYPERCHOLESTEROLEMIA 12/17/2006  . Essential hypertension 12/17/2006  . DIVERTICULAR DISEASE 12/17/2006  . VAGINITIS, ATROPHIC 12/17/2006  . Osteoarthritis, generalized 12/17/2006  . SCOLIOSIS 12/17/2006  . Seasonal and perennial allergic rhinitis 12/17/2006    Blythe Stanford, PT DPT 09/02/2018, 3:58 PM  [place] [facility] [address], [place], [address] Phone: [phone]   Fax:  [phone]  Name: Marilyne Haseley MRN: [id number] Date of Birth: [birth year]

## 2018-09-05 ENCOUNTER — Ambulatory Visit: Payer: Medicare Other | Attending: Family Medicine

## 2018-09-05 DIAGNOSIS — M545 Low back pain, unspecified: Secondary | ICD-10-CM

## 2018-09-05 DIAGNOSIS — G8929 Other chronic pain: Secondary | ICD-10-CM | POA: Insufficient documentation

## 2018-09-05 DIAGNOSIS — M6281 Muscle weakness (generalized): Secondary | ICD-10-CM

## 2018-09-05 DIAGNOSIS — M62830 Muscle spasm of back: Secondary | ICD-10-CM

## 2018-09-05 NOTE — Therapy (Signed)
[facility] [address], [place], [address] Phone: [phone]   Fax:  [phone]  Physical Therapy Treatment  Patient Details  Name: [name] [name] MRN: [id number] Date of Birth: Jan 22, 1941 Referring Provider (PT): Amy Diona Browner   Encounter Date: 09/05/2018  PT End of Session - 09/05/18 1417    Visit Number  26    Number of Visits  33    Date for PT Re-Evaluation  10/14/18    Authorization Type  6/ 10 G Code    PT Start Time  6063    PT Stop Time  1430    PT Time Calculation (min)  45 min    Activity Tolerance  Patient tolerated treatment well    Behavior During Therapy  [facility] for tasks assessed/performed       Past Medical History:  Diagnosis Date  . Arthritis   . Chronic lower back pain   . Diverticulitis   . GERD (gastroesophageal reflux disease)   . Hemorrhoids, internal, with bleeding   . Hiatal hernia   . History of colon polyps   . HLD (hyperlipidemia)   . Hypertension   . OSA (obstructive sleep apnea)   . PUD (peptic ulcer disease)   . Scoliosis   . Thyroid disease    hypo  . Thyroid nodule   . Vaginitis, atrophic   . Vertigo     Past Surgical History:  Procedure Laterality Date  . ANKLE FUSION  1967   LEFT  . CHOLECYSTECTOMY     2004  . COLONOSCOPY  06/22/2011   diverticulosis (no adenomas since 2001)  . FRACTURE SURGERY Left 1967   foot and ankle surgery    There were no vitals filed for this visit.  Subjective Assessment - 09/05/18 1414    Subjective  Patient reports her back has hurt this morning when waking up in the morning. Patient states the pain is alleviated after using asper cream.    Pertinent History  ankle fusion on the R side, lumbar scoliosis, menieres disease    Limitations  Lifting;Walking;Standing    How long can you walk comfortably?  Immediate pain    Patient Stated Goals  To decrease pain    Currently in Pain?  No/denies    Pain Score  5     Pain  Location  Back    Pain Orientation  Lower    Pain Descriptors / Indicators  Aching    Pain Type  Chronic pain    Pain Onset  More than a month ago       TREATMENT  Therapeutic Exercise Standing hip extension at hip machine - x 20 25# Standing hip abduction at hip machine - x 20 25# Side stepping across GTB - x 10 (2 steps) Standing squats with UE support - x 20  Standing lumbar upward rotation with YTB - x 20  Lumbar backwards walking - x 8 (5 steps) Nustep in sitting level 1 - 5 min   Manual Therapy STM performed to the multifidus B with patient in sitting utilizing superficial techniques to decrease increased spasms and pain  Patient demonstrates decreased pain at the end of the session   PT Education - 09/05/18 1417    Education Details  form/technique with exercise    Person(s) Educated  Patient    Methods  Explanation;Demonstration    Comprehension  Verbalized understanding;Returned demonstration          PT Long Term Goals -  09/02/18 1524      PT LONG TERM GOAL #1   Title  Patient will be independent with exercise performance and progression to continue benefits of therapy after discharge    Baseline  form/technique with exercise; 06/17/2018: Moderate cueing on form/technique; 07/29/2018: minimal cueing to perform; 09/02/2018: Independent    Time  6    Period  Weeks    Status  Achieved      PT LONG TERM GOAL #2   Title  Patient will improve 49mwt to over 11m/s to indicate a decrease in fall risk with performing community ambulation    Baseline  .71 m/s; 06/17/2018: .37m/s; 07/29/2018: .43m/s; 09/02/2018: .18m/s    Time  6    Period  Weeks    Status  On-going      PT LONG TERM GOAL #3   Title  Patient will have a worst pain of 3/10 to indicate significant improvement and greater ability to perform standing with less pain allowing for performance of greater amount of ADLs.     Baseline  7/10 worst pain; 06/17/2018: 5/10 worst pain; 07/29/2018: 3/10 worst pain     Time  6    Period  Weeks    Status  Achieved      PT LONG TERM GOAL #4   Title  Patient will be able to ambulate for 1 hour to better walk at the grocery store without having to stop secondary to pain/fatigue.     Baseline  Able to amb 66min; 09/02/2018: Able to slowly perform for 1 hour- normal pace for 10 min    Time  6    Period  Weeks    Status  On-going            Plan - 09/05/18 1418    Clinical Impression Statement  Patient demonstrates improvement with exercise with ability to perform greater amount of exercises within a shorter period of time indicating functioncal carryover. Patient continues to have difficulty and pain with porlonged walking.  Patient demosntrates decreased pain at the end of the session and patient will benefit from further skilled therapy to return to prior level of function.     Clinical Impairments Affecting Rehab Potential  (+) highly motivated, active lifestyle (-) chronicity, age    PT Frequency  2x / week    PT Duration  6 weeks    PT Treatment/Interventions  Gait training;Stair training;Functional mobility training;Therapeutic activities;Therapeutic exercise;Balance training;Electrical Stimulation;Cryotherapy;Iontophoresis 4mg /ml Dexamethasone;Moist Heat;Traction;Patient/family education;Manual techniques;Neuromuscular re-education;Passive range of motion;Dry needling    PT Next Visit Plan  progress exercise performance and technique    PT Home Exercise Plan  See education section(s)    Consulted and Agree with Plan of Care  Patient       Patient will benefit from skilled therapeutic intervention in order to improve the following deficits and impairments:  Decreased endurance, Decreased activity tolerance, Decreased balance, Pain, Decreased range of motion, Decreased strength, Difficulty walking, Abnormal gait, Increased fascial restricitons, Decreased coordination, Increased muscle spasms  Visit Diagnosis: Chronic right-sided low back pain  without sciatica  Muscle weakness (generalized)  Muscle spasm of back     Problem List Patient Active Problem List   Diagnosis Date Noted  . Hyponatremia 01/08/2018  . Meniere's disease 01/08/2018  . 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 Lower extremity edema 08/09/2017  . Gait instability 08/09/2017  . Choking sensation 06/21/2017  . Inclusion cyst of vulva 05/14/2017  . Spasm of thoracic back muscle 05/11/2017  .  Vulvar lesion 03/05/2017  . Situational anxiety 02/09/2017  . Puncture wound 01/05/2017  . Chronic insomnia 01/02/2017  . Counseling regarding end of life decision making 12/18/2014  . Raynaud phenomenon 08/31/2014  . Chronic allergic conjunctivitis 03/15/2014  . PAD (peripheral artery disease) ([place]) 03/05/2013  . Chronic back pain greater than 3 months duration 08/13/2012  . S/P ankle fusion 02/06/2011  . TEMPOROMANDIBULAR JOINT PAIN 05/20/2010  . Carotid stenosis 06/30/2009  . Multiple thyroid nodules 03/15/2009  . Obstructive sleep apnea 03/05/2009  . HEMORRHOIDS, INTERNAL, WITH BLEEDING 05/09/2007  . VERTIGO 12/18/2006  . COLONIC POLYPS 12/17/2006  . HYPERCHOLESTEROLEMIA 12/17/2006  . Essential hypertension 12/17/2006  . DIVERTICULAR DISEASE 12/17/2006  . VAGINITIS, ATROPHIC 12/17/2006  . Osteoarthritis, generalized 12/17/2006  . SCOLIOSIS 12/17/2006  . Seasonal and perennial allergic rhinitis 12/17/2006    Blythe Stanford, PT DPT 09/05/2018, 2:25 PM  [facility] [address] [place], [address] Phone: [phone]   Fax:  [phone]  Name: [name] [name] MRN: [id number] Date of Birth: 18-Dec-1940

## 2018-09-11 ENCOUNTER — Ambulatory Visit (INDEPENDENT_AMBULATORY_CARE_PROVIDER_SITE_OTHER): Payer: Medicare Other | Admitting: Podiatry

## 2018-09-11 ENCOUNTER — Encounter: Payer: Self-pay | Admitting: Podiatry

## 2018-09-11 DIAGNOSIS — Q828 Other specified congenital malformations of skin: Secondary | ICD-10-CM | POA: Diagnosis not present

## 2018-09-11 DIAGNOSIS — B351 Tinea unguium: Secondary | ICD-10-CM

## 2018-09-11 DIAGNOSIS — M79676 Pain in unspecified toe(s): Secondary | ICD-10-CM

## 2018-09-11 NOTE — Progress Notes (Signed)
She presents today chief complaint of painful elongated toenails.  Objective: Toenails are long thick yellow dystrophic-like mycotic.  Assessment: Pain in limb secondary to onychomycosis.  Plan: Debridement of toenails 1 through 5 bilateral.

## 2018-09-12 ENCOUNTER — Ambulatory Visit: Payer: Medicare Other

## 2018-09-12 DIAGNOSIS — M545 Low back pain, unspecified: Secondary | ICD-10-CM

## 2018-09-12 DIAGNOSIS — M62830 Muscle spasm of back: Secondary | ICD-10-CM

## 2018-09-12 DIAGNOSIS — G8929 Other chronic pain: Secondary | ICD-10-CM

## 2018-09-12 DIAGNOSIS — M6281 Muscle weakness (generalized): Secondary | ICD-10-CM

## 2018-09-12 NOTE — Therapy (Signed)
[facility] [address], [place], [address] Phone: [phone]   Fax:  [phone]  Physical Therapy Treatment  Patient Details  Name: [name] [name] MRN: [id number] Date of Birth: 1940-10-22 Referring Provider (PT): Amy Diona Browner   Encounter Date: 09/12/2018  PT End of Session - 09/12/18 1440    Visit Number  27    Number of Visits  33    Date for PT Re-Evaluation  10/14/18    Authorization Type  7/ 10 G Code    PT Start Time  6812    PT Stop Time  1500    PT Time Calculation (min)  45 min    Activity Tolerance  Patient tolerated treatment well    Behavior During Therapy  [facility] for tasks assessed/performed       Past Medical History:  Diagnosis Date  . Arthritis   . Chronic lower back pain   . Diverticulitis   . GERD (gastroesophageal reflux disease)   . Hemorrhoids, internal, with bleeding   . Hiatal hernia   . History of colon polyps   . HLD (hyperlipidemia)   . Hypertension   . OSA (obstructive sleep apnea)   . PUD (peptic ulcer disease)   . Scoliosis   . Thyroid disease    hypo  . Thyroid nodule   . Vaginitis, atrophic   . Vertigo     Past Surgical History:  Procedure Laterality Date  . ANKLE FUSION  1967   LEFT  . CHOLECYSTECTOMY     2004  . COLONOSCOPY  06/22/2011   diverticulosis (no adenomas since 2001)  . FRACTURE SURGERY Left 1967   foot and ankle surgery    There were no vitals filed for this visit.  Subjective Assessment - 09/12/18 1435    Subjective  Patient states improvement over with functioning overall and states she is getting a lot stronger and can walk easier.     Pertinent History  ankle fusion on the R side, lumbar scoliosis, menieres disease    Limitations  Lifting;Walking;Standing    How long can you walk comfortably?  Immediate pain    Patient Stated Goals  To decrease pain    Currently in Pain?  No/denies    Pain Onset  More than a month ago        TREATMENT  Therapeutic Exercise Standing hip extension at hip machine - x 20 25# Standing hip abduction at hip machine - x 20 25# Side stepping across GTB - x 20  Standing squats with UE support - x 20  Standing lumbar upward rotation with YTB - x 20  Physioball raises overhead with Green physioball - x 20 and rotational x20 Tandem stance - x 30sec with intermittent UE support Lumbar backwards walking - x 8 (5 steps)/ forward walking with double black band    Performed exercises to improve standing balance and muscular strength/endurance of the hip and lumbar musculature.   PT Education - 09/12/18 1438    Education Details  form/technique with exercise    Person(s) Educated  Patient    Methods  Explanation;Demonstration    Comprehension  Verbalized understanding;Returned demonstration          PT Long Term Goals - 09/02/18 1524      PT LONG TERM GOAL #1   Title  Patient will be independent with exercise performance and progression to continue benefits of therapy after discharge    Baseline  form/technique with exercise;  06/17/2018: Moderate cueing on form/technique; 07/29/2018: minimal cueing to perform; 09/02/2018: Independent    Time  6    Period  Weeks    Status  Achieved      PT LONG TERM GOAL #2   Title  Patient will improve 42mwt to over 36m/s to indicate a decrease in fall risk with performing community ambulation    Baseline  .71 m/s; 06/17/2018: .19m/s; 07/29/2018: .21m/s; 09/02/2018: .21m/s    Time  6    Period  Weeks    Status  On-going      PT LONG TERM GOAL #3   Title  Patient will have a worst pain of 3/10 to indicate significant improvement and greater ability to perform standing with less pain allowing for performance of greater amount of ADLs.     Baseline  7/10 worst pain; 06/17/2018: 5/10 worst pain; 07/29/2018: 3/10 worst pain    Time  6    Period  Weeks    Status  Achieved      PT LONG TERM GOAL #4   Title  Patient will be able to ambulate  for 1 hour to better walk at the grocery store without having to stop secondary to pain/fatigue.     Baseline  Able to amb 65min; 09/02/2018: Able to slowly perform for 1 hour- normal pace for 10 min    Time  6    Period  Weeks    Status  On-going            Plan - 09/12/18 1729    Clinical Impression Statement  Patient overall limitation is in hip/lumbar muscular strength and endurance. Patient demonstrates overall improvement with abil;ity to perform greater amount of exercises before onset of fatigue, but continues to requires sitting and standing rest breaks for muscular recovery. Patient will benefit from further skilled therapy to return to prior level of function.    Clinical Impairments Affecting Rehab Potential  (+) highly motivated, active lifestyle (-) chronicity, age    PT Frequency  2x / week    PT Duration  6 weeks    PT Treatment/Interventions  Gait training;Stair training;Functional mobility training;Therapeutic activities;Therapeutic exercise;Balance training;Electrical Stimulation;Cryotherapy;Iontophoresis 4mg /ml Dexamethasone;Moist Heat;Traction;Patient/family education;Manual techniques;Neuromuscular re-education;Passive range of motion;Dry needling    PT Next Visit Plan  progress exercise performance and technique    PT Home Exercise Plan  See education section(s)    Consulted and Agree with Plan of Care  Patient       Patient will benefit from skilled therapeutic intervention in order to improve the following deficits and impairments:  Decreased endurance, Decreased activity tolerance, Decreased balance, Pain, Decreased range of motion, Decreased strength, Difficulty walking, Abnormal gait, Increased fascial restricitons, Decreased coordination, Increased muscle spasms  Visit Diagnosis: Chronic right-sided low back pain without sciatica  Muscle weakness (generalized)  Muscle spasm of back     Problem List Patient Active Problem List   Diagnosis Date Noted   . Hyponatremia 01/08/2018  . Meniere's disease 01/08/2018  . 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 Lower extremity edema 08/09/2017  . Gait instability 08/09/2017  . Choking sensation 06/21/2017  . Inclusion cyst of vulva 05/14/2017  . Spasm of thoracic back muscle 05/11/2017  . Vulvar lesion 03/05/2017  . Situational anxiety 02/09/2017  . Puncture wound 01/05/2017  . Chronic insomnia 01/02/2017  . Counseling regarding end of life decision making 12/18/2014  . Raynaud phenomenon 08/31/2014  . Chronic allergic conjunctivitis 03/15/2014  . PAD (peripheral artery disease) ([place]) 03/05/2013  .  Chronic back pain greater than 3 months duration 08/13/2012  . S/P ankle fusion 02/06/2011  . TEMPOROMANDIBULAR JOINT PAIN 05/20/2010  . Carotid stenosis 06/30/2009  . Multiple thyroid nodules 03/15/2009  . Obstructive sleep apnea 03/05/2009  . HEMORRHOIDS, INTERNAL, WITH BLEEDING 05/09/2007  . VERTIGO 12/18/2006  . COLONIC POLYPS 12/17/2006  . HYPERCHOLESTEROLEMIA 12/17/2006  . Essential hypertension 12/17/2006  . DIVERTICULAR DISEASE 12/17/2006  . VAGINITIS, ATROPHIC 12/17/2006  . Osteoarthritis, generalized 12/17/2006  . SCOLIOSIS 12/17/2006  . Seasonal and perennial allergic rhinitis 12/17/2006    Blythe Stanford, PT DPT 09/12/2018, 5:33 PM  [facility] [address] [place], [address] Phone: [phone]   Fax:  [phone]  Name: [name] [name] MRN: [id number] Date of Birth: 09-28-1940

## 2018-09-18 ENCOUNTER — Ambulatory Visit: Payer: Medicare Other

## 2018-09-18 DIAGNOSIS — M545 Low back pain, unspecified: Secondary | ICD-10-CM

## 2018-09-18 DIAGNOSIS — M6281 Muscle weakness (generalized): Secondary | ICD-10-CM

## 2018-09-18 DIAGNOSIS — G8929 Other chronic pain: Secondary | ICD-10-CM

## 2018-09-18 NOTE — Therapy (Signed)
[facility] [address] [place], [address] Phone: [phone]   Fax:  [phone]  Physical Therapy Treatment  Patient Details  Name: [name] [name] MRN: [id number] Date of Birth: 12-21-1940 Referring Provider (PT): Amy Diona Browner   Encounter Date: 09/18/2018  PT End of Session - 09/18/18 1438    Visit Number  28    Number of Visits  33    Date for PT Re-Evaluation  10/14/18    Authorization Type  8/ 10 G Code    PT Start Time  1761    PT Stop Time  1430    PT Time Calculation (min)  45 min    Activity Tolerance  Patient tolerated treatment well    Behavior During Therapy  [facility] for tasks assessed/performed       Past Medical History:  Diagnosis Date  . Arthritis   . Chronic lower back pain   . Diverticulitis   . GERD (gastroesophageal reflux disease)   . Hemorrhoids, internal, with bleeding   . Hiatal hernia   . History of colon polyps   . HLD (hyperlipidemia)   . Hypertension   . OSA (obstructive sleep apnea)   . PUD (peptic ulcer disease)   . Scoliosis   . Thyroid disease    hypo  . Thyroid nodule   . Vaginitis, atrophic   . Vertigo     Past Surgical History:  Procedure Laterality Date  . ANKLE FUSION  1967   LEFT  . CHOLECYSTECTOMY     2004  . COLONOSCOPY  06/22/2011   diverticulosis (no adenomas since 2001)  . FRACTURE SURGERY Left 1967   foot and ankle surgery    There were no vitals filed for this visit.  Subjective Assessment - 09/18/18 1436    Subjective  Pt states she is "dizzy and tired from her meniere's disease".     Pertinent History  ankle fusion on the R side, lumbar scoliosis, menieres disease    Limitations  Lifting;Walking;Standing    How long can you walk comfortably?  Immediate pain    Patient Stated Goals  To decrease pain    Currently in Pain?  No/denies    Pain Onset  More than a month ago      TREATMENT  Therapeutic Exercise  Standing B hip extension  machine: 1x20, 25 lbs   Standing B hip abduction machine: 1x20, 25lbs  Standing lumbar upward rotation B pull with THB (Green): 1x20  Lumbar backwards walking: x 10 (5 steps)/ forward walking x10 with double black band  Tandem stance on airex beam using railing for UE support down and back x10  Green physioball overhead raises in standing: 1x25   Sit-to-stand with UE support: 1x10   Therapeutic exercise to improve balance and lumbar and LE strengthening.  PT Long Term Goals - 09/02/18 1524      PT LONG TERM GOAL #1   Title  Patient will be independent with exercise performance and progression to continue benefits of therapy after discharge    Baseline  form/technique with exercise; 06/17/2018: Moderate cueing on form/technique; 07/29/2018: minimal cueing to perform; 09/02/2018: Independent    Time  6    Period  Weeks    Status  Achieved      PT LONG TERM GOAL #2   Title  Patient will improve 60mwt to over 63m/s to indicate a decrease in fall risk with performing community ambulation    Baseline  .[place]  m/s; 06/17/2018: .71m/s; 07/29/2018: .27m/s; 09/02/2018: .85m/s    Time  6    Period  Weeks    Status  On-going      PT LONG TERM GOAL #3   Title  Patient will have a worst pain of 3/10 to indicate significant improvement and greater ability to perform standing with less pain allowing for performance of greater amount of ADLs.     Baseline  7/10 worst pain; 06/17/2018: 5/10 worst pain; 07/29/2018: 3/10 worst pain    Time  6    Period  Weeks    Status  Achieved      PT LONG TERM GOAL #4   Title  Patient will be able to ambulate for 1 hour to better walk at the grocery store without having to stop secondary to pain/fatigue.     Baseline  Able to amb 50min; 09/02/2018: Able to slowly perform for 1 hour- normal pace for 10 min    Time  6    Period  Weeks    Status  On-going            Plan - 09/18/18 1439    Clinical Impression Statement  Pt continues to have LE and lumbar  strength deficits. Pt expressed fear to perform exercises due to feeling dizzy, however she was able to perform all exercises. Pt will benefit from continued skilled treatment to improve balance, LE strength, and lumbar strength to return to prior level of function.    Clinical Impairments Affecting Rehab Potential  (+) highly motivated, active lifestyle (-) chronicity, age    PT Frequency  2x / week    PT Duration  6 weeks    PT Treatment/Interventions  Gait training;Stair training;Functional mobility training;Therapeutic activities;Therapeutic exercise;Balance training;Electrical Stimulation;Cryotherapy;Iontophoresis 4mg /ml Dexamethasone;Moist Heat;Traction;Patient/family education;Manual techniques;Neuromuscular re-education;Passive range of motion;Dry needling    PT Next Visit Plan  progress exercise performance and technique    PT Home Exercise Plan  See education section(s)    Consulted and Agree with Plan of Care  Patient       Patient will benefit from skilled therapeutic intervention in order to improve the following deficits and impairments:  Decreased endurance, Decreased activity tolerance, Decreased balance, Pain, Decreased range of motion, Decreased strength, Difficulty walking, Abnormal gait, Increased fascial restricitons, Decreased coordination, Increased muscle spasms  Visit Diagnosis: Chronic right-sided low back pain without sciatica  Muscle weakness (generalized)     Problem List Patient Active Problem List   Diagnosis Date Noted  . Hyponatremia 01/08/2018  . Meniere's disease 01/08/2018  . 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 Lower extremity edema 08/09/2017  . Gait instability 08/09/2017  . Choking sensation 06/21/2017  . Inclusion cyst of vulva 05/14/2017  . Spasm of thoracic back muscle 05/11/2017  . Vulvar lesion 03/05/2017  . Situational anxiety 02/09/2017  . Puncture wound 01/05/2017  . Chronic insomnia 01/02/2017  . Counseling  regarding end of life decision making 12/18/2014  . Raynaud phenomenon 08/31/2014  . Chronic allergic conjunctivitis 03/15/2014  . PAD (peripheral artery disease) ([place]) 03/05/2013  . Chronic back pain greater than 3 months duration 08/13/2012  . S/P ankle fusion 02/06/2011  . TEMPOROMANDIBULAR JOINT PAIN 05/20/2010  . Carotid stenosis 06/30/2009  . Multiple thyroid nodules 03/15/2009  . Obstructive sleep apnea 03/05/2009  . HEMORRHOIDS, INTERNAL, WITH BLEEDING 05/09/2007  . VERTIGO 12/18/2006  . COLONIC POLYPS 12/17/2006  . HYPERCHOLESTEROLEMIA 12/17/2006  . Essential hypertension 12/17/2006  . DIVERTICULAR DISEASE 12/17/2006  . VAGINITIS, ATROPHIC 12/17/2006  .  Osteoarthritis, generalized 12/17/2006  . SCOLIOSIS 12/17/2006  . Seasonal and perennial allergic rhinitis 12/17/2006    Rachael Fee, SPT 09/18/2018, 5:00 PM  [facility] [address] [place], [address] Phone: [phone]   Fax:  [phone]  Name: [name] [name] MRN: [id number] Date of Birth: [birth year]

## 2018-10-01 ENCOUNTER — Ambulatory Visit: Payer: Medicare Other

## 2018-10-01 DIAGNOSIS — M62830 Muscle spasm of back: Secondary | ICD-10-CM

## 2018-10-01 DIAGNOSIS — M6281 Muscle weakness (generalized): Secondary | ICD-10-CM

## 2018-10-01 DIAGNOSIS — M545 Low back pain: Secondary | ICD-10-CM | POA: Diagnosis not present

## 2018-10-01 DIAGNOSIS — G8929 Other chronic pain: Secondary | ICD-10-CM

## 2018-10-01 NOTE — Therapy (Signed)
[facility] [address], [place], [address] Phone: [phone]   Fax:  [phone]  Physical Therapy Treatment  Patient Details  Name: [name] [name] MRN: [id number] Date of Birth: [birth year] Referring Provider (PT): Amy Diona Browner   Encounter Date: 10/01/2018  PT End of Session - 10/01/18 1432    Visit Number  29    Number of Visits  33    Date for PT Re-Evaluation  10/14/18    Authorization Type  9/ 10 G Code    PT Start Time  5681    PT Stop Time  1430    PT Time Calculation (min)  45 min    Activity Tolerance  Patient tolerated treatment well    Behavior During Therapy  [facility] for tasks assessed/performed       Past Medical History:  Diagnosis Date  . Arthritis   . Chronic lower back pain   . Diverticulitis   . GERD (gastroesophageal reflux disease)   . Hemorrhoids, internal, with bleeding   . Hiatal hernia   . History of colon polyps   . HLD (hyperlipidemia)   . Hypertension   . OSA (obstructive sleep apnea)   . PUD (peptic ulcer disease)   . Scoliosis   . Thyroid disease    hypo  . Thyroid nodule   . Vaginitis, atrophic   . Vertigo     Past Surgical History:  Procedure Laterality Date  . ANKLE FUSION  1967   LEFT  . CHOLECYSTECTOMY     2004  . COLONOSCOPY  06/22/2011   diverticulosis (no adenomas since 2001)  . FRACTURE SURGERY Left 1967   foot and ankle surgery    There were no vitals filed for this visit.  Subjective Assessment - 10/01/18 1354    Subjective  Pt reports that her low back is "stiff" prior to session. No reports of pain. Pt reports performing HEP 2-3x/week has been "essential". Pt states she is having "trouble" performing exercises in the winter.     Pertinent History  ankle fusion on the R side, lumbar scoliosis, menieres disease    Limitations  Lifting;Walking;Standing    How long can you walk comfortably?  Immediate pain    Patient Stated Goals  To decrease  pain    Currently in Pain?  No/denies    Pain Onset  More than a month ago     TREATMENT  THERAPEUTIC EXERCISE  Nu-step level 3: 6 min   Seated shoulder flexion with physioball: 3 min   Standing physioball (green ball) overhead raises: 1x20  Standing physioball (green ball) rotations to left and right: 1x20/side  B Lateral lunge on furniture slider with UE support: 1x10  Standing lumbar upward rotations with green physioball to left and right: 1x12  B Hip extension on furniture slide with UE support: 1x10   Standing B hip abduction machine: 1x25, 25 lbs  Standing B hip extension machine: 1x20, 25 lbs  B Standing hip abduction with TB (yellow) around knees: 1x15  B Standing hip extension with TB (yellow): around knees: 1x15    Therapeutic exercises to improve LE strength and power to benefit pt with decrease fall risk and improved community ambulation. Targeted lumbar exercises to decrease LBP and to improve lumbar strength.    PT Education - 10/01/18 1358    Education Details  Form/technique with exercise.    Person(s) Educated  Patient    Methods  Explanation;Demonstration    Comprehension  Verbalized understanding;Returned demonstration          PT Long Term Goals - 09/02/18 1524      PT LONG TERM GOAL #1   Title  Patient will be independent with exercise performance and progression to continue benefits of therapy after discharge    Baseline  form/technique with exercise; 06/17/2018: Moderate cueing on form/technique; 07/29/2018: minimal cueing to perform; 09/02/2018: Independent    Time  6    Period  Weeks    Status  Achieved      PT LONG TERM GOAL #2   Title  Patient will improve 79mwt to over 60m/s to indicate a decrease in fall risk with performing community ambulation    Baseline  .71 m/s; 06/17/2018: .7m/s; 07/29/2018: .64m/s; 09/02/2018: .11m/s    Time  6    Period  Weeks    Status  On-going      PT LONG TERM GOAL #3   Title  Patient will have a worst pain of  3/10 to indicate significant improvement and greater ability to perform standing with less pain allowing for performance of greater amount of ADLs.     Baseline  7/10 worst pain; 06/17/2018: 5/10 worst pain; 07/29/2018: 3/10 worst pain    Time  6    Period  Weeks    Status  Achieved      PT LONG TERM GOAL #4   Title  Patient will be able to ambulate for 1 hour to better walk at the grocery store without having to stop secondary to pain/fatigue.     Baseline  Able to amb 58min; 09/02/2018: Able to slowly perform for 1 hour- normal pace for 10 min    Time  6    Period  Weeks    Status  On-going            Plan - 10/01/18 1433    Clinical Impression Statement  Today's treatment session focused on Hip strength/endurance and lumbar strengthening. Pt was able to perform all exercises with minimum breaks needed. Patient demonstrates increased difficulty with performing lumbar rotation requiring therapist overpressure to perform. Pt can continue to benefit from skilled treatment to improve.  LBP, LE strength/endurance to return to PLOF.    Clinical Impairments Affecting Rehab Potential  (+) highly motivated, active lifestyle (-) chronicity, age    PT Frequency  2x / week    PT Duration  6 weeks    PT Treatment/Interventions  Gait training;Stair training;Functional mobility training;Therapeutic activities;Therapeutic exercise;Balance training;Electrical Stimulation;Cryotherapy;Iontophoresis 4mg /ml Dexamethasone;Moist Heat;Traction;Patient/family education;Manual techniques;Neuromuscular re-education;Passive range of motion;Dry needling    PT Next Visit Plan  progress exercise performance and technique    PT Home Exercise Plan  See education section(s)    Consulted and Agree with Plan of Care  Patient       Patient will benefit from skilled therapeutic intervention in order to improve the following deficits and impairments:  Decreased endurance, Decreased activity tolerance, Decreased balance,  Pain, Decreased range of motion, Decreased strength, Difficulty walking, Abnormal gait, Increased fascial restricitons, Decreased coordination, Increased muscle spasms  Visit Diagnosis: Chronic right-sided low back pain without sciatica  Muscle weakness (generalized)  Muscle spasm of back     Problem List Patient Active Problem List   Diagnosis Date Noted  . Hyponatremia 01/08/2018  . Meniere's disease 01/08/2018  . 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 Lower extremity edema 08/09/2017  . Gait instability 08/09/2017  . Choking sensation 06/21/2017  . Inclusion cyst of vulva 05/14/2017  .  Spasm of thoracic back muscle 05/11/2017  . Vulvar lesion 03/05/2017  . Situational anxiety 02/09/2017  . Puncture wound 01/05/2017  . Chronic insomnia 01/02/2017  . Counseling regarding end of life decision making 12/18/2014  . Raynaud phenomenon 08/31/2014  . Chronic allergic conjunctivitis 03/15/2014  . PAD (peripheral artery disease) ([place]) 03/05/2013  . Chronic back pain greater than 3 months duration 08/13/2012  . S/P ankle fusion 02/06/2011  . TEMPOROMANDIBULAR JOINT PAIN 05/20/2010  . Carotid stenosis 06/30/2009  . Multiple thyroid nodules 03/15/2009  . Obstructive sleep apnea 03/05/2009  . HEMORRHOIDS, INTERNAL, WITH BLEEDING 05/09/2007  . VERTIGO 12/18/2006  . COLONIC POLYPS 12/17/2006  . HYPERCHOLESTEROLEMIA 12/17/2006  . Essential hypertension 12/17/2006  . DIVERTICULAR DISEASE 12/17/2006  . VAGINITIS, ATROPHIC 12/17/2006  . Osteoarthritis, generalized 12/17/2006  . SCOLIOSIS 12/17/2006  . Seasonal and perennial allergic rhinitis 12/17/2006    Rachael Fee, SPT 10/01/2018, 2:38 PM  [facility] [address], [place], [address] Phone: [phone]   Fax:  [phone]  Name: [name] [name] MRN: [id number] Date of Birth: Jan 10, 1941

## 2018-10-03 ENCOUNTER — Ambulatory Visit: Payer: Medicare Other

## 2018-10-03 DIAGNOSIS — G8929 Other chronic pain: Secondary | ICD-10-CM

## 2018-10-03 DIAGNOSIS — M545 Low back pain: Secondary | ICD-10-CM | POA: Diagnosis not present

## 2018-10-03 DIAGNOSIS — M62830 Muscle spasm of back: Secondary | ICD-10-CM

## 2018-10-03 DIAGNOSIS — M6281 Muscle weakness (generalized): Secondary | ICD-10-CM

## 2018-10-03 NOTE — Therapy (Signed)
[facility] [address], [place], [address] Phone: [phone]   Fax:  [phone]  Physical Therapy Treatment  Patient Details  Name: [name] [name] MRN: [id number] Date of Birth: June 05, 1941 Referring Provider (PT): Eliezer Lofts   Encounter Date: 10/03/2018  PT End of Session - 10/03/18 1428    Visit Number  30    Number of Visits  33    Date for PT Re-Evaluation  10/14/18    Authorization Type  6/ 10 G Code    PT Start Time  1350    PT Stop Time  1430    PT Time Calculation (min)  40 min    Activity Tolerance  Patient tolerated treatment well    Behavior During Therapy  [facility] for tasks assessed/performed       Past Medical History:  Diagnosis Date  . Arthritis   . Chronic lower back pain   . Diverticulitis   . GERD (gastroesophageal reflux disease)   . Hemorrhoids, internal, with bleeding   . Hiatal hernia   . History of colon polyps   . HLD (hyperlipidemia)   . Hypertension   . OSA (obstructive sleep apnea)   . PUD (peptic ulcer disease)   . Scoliosis   . Thyroid disease    hypo  . Thyroid nodule   . Vaginitis, atrophic   . Vertigo     Past Surgical History:  Procedure Laterality Date  . ANKLE FUSION  1967   LEFT  . CHOLECYSTECTOMY     2004  . COLONOSCOPY  06/22/2011   diverticulosis (no adenomas since 2001)  . FRACTURE SURGERY Left 1967   foot and ankle surgery    There were no vitals filed for this visit.  Subjective Assessment - 10/03/18 1355    Subjective  Pt reports her lowback feels like it is "pulling her forward" and "affecting her posture".  No reports of LBP.   Pertinent History  ankle fusion on the R side, lumbar scoliosis, menieres disease    Limitations  Lifting;Walking;Standing    How long can you walk comfortably?  Immediate pain    Patient Stated Goals  To decrease pain    Currently in Pain?  No/denies    Pain Onset  More than a month ago      TREATMENT   THERAPEUTIC EXERCISE Seated shoulder flexion on physioball to stretch back musculature: 3 min  Nu-Step level 4: 5 min Standing physioball (green ball) overhead raises: 1x20 Standing physioball (green ball) rotations to left and right: 1x20/side Standing low row with TB (yellow)1x20:  B standing hip abduction with TB (Red): 2x12 B standing hip extension with TB (Red): 2x12 Sit-to-stand with med ball overhead (2 kg): 1x7  Therapeutic exercises to improve lumbar and thoracic mobility. Periscapular and paraspinal muscle strengthening and LE strengthening to improve function of ADL's. Pt demonstrated fatigue after session.   PT Education - 10/03/18 1356    Education Details  Form/technique with exercise    Person(s) Educated  Patient    Methods  Explanation;Demonstration    Comprehension  Verbalized understanding;Returned demonstration          PT Long Term Goals - 09/02/18 1524      PT LONG TERM GOAL #1   Title  Patient will be independent with exercise performance and progression to continue benefits of therapy after discharge    Baseline  form/technique with exercise; 06/17/2018: Moderate cueing on form/technique; 07/29/2018: minimal cueing to perform;  09/02/2018: Independent    Time  6    Period  Weeks    Status  Achieved      PT LONG TERM GOAL #2   Title  Patient will improve 63mwt to over 60m/s to indicate a decrease in fall risk with performing community ambulation    Baseline  .71 m/s; 06/17/2018: .36m/s; 07/29/2018: .47m/s; 09/02/2018: .50m/s    Time  6    Period  Weeks    Status  On-going      PT LONG TERM GOAL #3   Title  Patient will have a worst pain of 3/10 to indicate significant improvement and greater ability to perform standing with less pain allowing for performance of greater amount of ADLs.     Baseline  7/10 worst pain; 06/17/2018: 5/10 worst pain; 07/29/2018: 3/10 worst pain    Time  6    Period  Weeks    Status  Achieved      PT LONG TERM GOAL #4    Title  Patient will be able to ambulate for 1 hour to better walk at the grocery store without having to stop secondary to pain/fatigue.     Baseline  Able to amb 42min; 09/02/2018: Able to slowly perform for 1 hour- normal pace for 10 min    Time  6    Period  Weeks    Status  On-going            Plan - 10/03/18 1429    Clinical Impression Statement  Pt completed all exercises with no increase in LBP. Pt performed an increase in repetitions and resistance of all exercises and demonstrated increased fatigue after session. Pt can continue to benefit from skilled treatment to improve LE strength to return to PLOF and be independent with regular exercise routine after discharge.     Clinical Impairments Affecting Rehab Potential  (+) highly motivated, active lifestyle (-) chronicity, age    PT Frequency  2x / week    PT Duration  6 weeks    PT Treatment/Interventions  Gait training;Stair training;Functional mobility training;Therapeutic activities;Therapeutic exercise;Balance training;Electrical Stimulation;Cryotherapy;Iontophoresis 4mg /ml Dexamethasone;Moist Heat;Traction;Patient/family education;Manual techniques;Neuromuscular re-education;Passive range of motion;Dry needling    PT Next Visit Plan  progress exercise performance and technique    PT Home Exercise Plan  See education section(s)    Consulted and Agree with Plan of Care  Patient       Patient will benefit from skilled therapeutic intervention in order to improve the following deficits and impairments:  Decreased endurance, Decreased activity tolerance, Decreased balance, Pain, Decreased range of motion, Decreased strength, Difficulty walking, Abnormal gait, Increased fascial restricitons, Decreased coordination, Increased muscle spasms  Visit Diagnosis: Chronic right-sided low back pain without sciatica  Muscle weakness (generalized)  Muscle spasm of back     Problem List Patient Active Problem List   Diagnosis Date  Noted  . Hyponatremia 01/08/2018  . Meniere's disease 01/08/2018  . 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 Lower extremity edema 08/09/2017  . Gait instability 08/09/2017  . Choking sensation 06/21/2017  . Inclusion cyst of vulva 05/14/2017  . Spasm of thoracic back muscle 05/11/2017  . Vulvar lesion 03/05/2017  . Situational anxiety 02/09/2017  . Puncture wound 01/05/2017  . Chronic insomnia 01/02/2017  . Counseling regarding end of life decision making 12/18/2014  . Raynaud phenomenon 08/31/2014  . Chronic allergic conjunctivitis 03/15/2014  . PAD (peripheral artery disease) ([place]) 03/05/2013  . Chronic back pain greater than 3 months duration  08/13/2012  . S/P ankle fusion 02/06/2011  . TEMPOROMANDIBULAR JOINT PAIN 05/20/2010  . Carotid stenosis 06/30/2009  . Multiple thyroid nodules 03/15/2009  . Obstructive sleep apnea 03/05/2009  . HEMORRHOIDS, INTERNAL, WITH BLEEDING 05/09/2007  . VERTIGO 12/18/2006  . COLONIC POLYPS 12/17/2006  . HYPERCHOLESTEROLEMIA 12/17/2006  . Essential hypertension 12/17/2006  . DIVERTICULAR DISEASE 12/17/2006  . VAGINITIS, ATROPHIC 12/17/2006  . Osteoarthritis, generalized 12/17/2006  . SCOLIOSIS 12/17/2006  . Seasonal and perennial allergic rhinitis 12/17/2006    Rachael Fee, SPT 10/03/2018, 2:32 PM  [facility] [address] [place], [address] Phone: [phone]   Fax:  [phone]  Name: [name] [name] MRN: [id number] Date of Birth: 02-Feb-1941

## 2018-10-09 ENCOUNTER — Ambulatory Visit: Payer: Medicare Other | Attending: Family Medicine

## 2018-10-09 DIAGNOSIS — G8929 Other chronic pain: Secondary | ICD-10-CM | POA: Insufficient documentation

## 2018-10-09 DIAGNOSIS — M62830 Muscle spasm of back: Secondary | ICD-10-CM | POA: Insufficient documentation

## 2018-10-09 DIAGNOSIS — M6281 Muscle weakness (generalized): Secondary | ICD-10-CM

## 2018-10-09 DIAGNOSIS — M545 Low back pain: Secondary | ICD-10-CM | POA: Diagnosis present

## 2018-10-09 NOTE — Therapy (Signed)
[facility] [address] [place], [address] Phone: [phone]   Fax:  [phone]  Physical Therapy Treatment  Patient Details  Name: [name] [name] MRN: [id number] Date of Birth: [birth year] Referring Provider (PT): Amy Diona Browner   Encounter Date: 10/09/2018  PT End of Session - 10/09/18 1154    Visit Number  31    Number of Visits  33    Date for PT Re-Evaluation  10/14/18    Authorization Type  7/ 10 G Code    PT Start Time  1115    PT Stop Time  1200    PT Time Calculation (min)  45 min    Activity Tolerance  Patient tolerated treatment well    Behavior During Therapy  [facility] for tasks assessed/performed       Past Medical History:  Diagnosis Date  . Arthritis   . Chronic lower back pain   . Diverticulitis   . GERD (gastroesophageal reflux disease)   . Hemorrhoids, internal, with bleeding   . Hiatal hernia   . History of colon polyps   . HLD (hyperlipidemia)   . Hypertension   . OSA (obstructive sleep apnea)   . PUD (peptic ulcer disease)   . Scoliosis   . Thyroid disease    hypo  . Thyroid nodule   . Vaginitis, atrophic   . Vertigo     Past Surgical History:  Procedure Laterality Date  . ANKLE FUSION  1967   LEFT  . CHOLECYSTECTOMY     2004  . COLONOSCOPY  06/22/2011   diverticulosis (no adenomas since 2001)  . FRACTURE SURGERY Left 1967   foot and ankle surgery    There were no vitals filed for this visit.  Subjective Assessment - 10/09/18 1122    Subjective  Pt reports B LBP at a 6/10 NPRS and states it is her "arthritis in my facet joints". Pt reports also that her low back feels "stiff" Pt states feeling "good after previous session".    Pertinent History  ankle fusion on the R side, lumbar scoliosis, menieres disease    Limitations  Lifting;Walking;Standing    How long can you walk comfortably?  Immediate pain    Patient Stated Goals  To decrease pain    Pain Score  6      Pain Location  Back    Pain Orientation  Lower    Pain Descriptors / Indicators  Aching    Pain Onset  More than a month ago      TREATMENT THERAPEUTIC EXERCISE  Standing lumbar rotation with yellow physioball to stretch back and paraspinals: L/R 20 per side  Standing yellow physioball overhead shoulder flexion to stretch paraspinals: 1x20  Nu-Step level 4: 8 min  Side-stepping over hurdle (1) with TB (Yellows) around knees: 1x10 per side Standing hip extension with TB (yellow): 2x10, 1x10 with no TB Standing hip abduction with TB (yellow): 2x10, 1x10, with no TB Standing low row with TB (Yellow): 2x20 Body squats with UE support: 1x20 Side stepping  with TB (yellow) around knees: x10 down and back with UE support on treadmill railing  Therapeutic exercise to stretch posterior chain musculature and to improve lumbar and thoracic spine mobility. Exercises to improve hip strength to benefit LE strength and stabilization to benefit patient with ADL's such as community ambulation and sitting to standing. Lumbar and thoracic strength exercises to improve posture and stability.   PT Education - 10/09/18 1153  Education Details  Form/technique with exercise. Education on benefits of movement for osteoarthritis     Person(s) Educated  Patient    Methods  Explanation;Demonstration    Comprehension  Verbalized understanding;Returned demonstration          PT Long Term Goals - 09/02/18 1524      PT LONG TERM GOAL #1   Title  Patient will be independent with exercise performance and progression to continue benefits of therapy after discharge    Baseline  form/technique with exercise; 06/17/2018: Moderate cueing on form/technique; 07/29/2018: minimal cueing to perform; 09/02/2018: Independent    Time  6    Period  Weeks    Status  Achieved      PT LONG TERM GOAL #2   Title  Patient will improve 50mwt to over 43m/s to indicate a decrease in fall risk with performing community ambulation     Baseline  .71 m/s; 06/17/2018: .55m/s; 07/29/2018: .36m/s; 09/02/2018: .64m/s    Time  6    Period  Weeks    Status  On-going      PT LONG TERM GOAL #3   Title  Patient will have a worst pain of 3/10 to indicate significant improvement and greater ability to perform standing with less pain allowing for performance of greater amount of ADLs.     Baseline  7/10 worst pain; 06/17/2018: 5/10 worst pain; 07/29/2018: 3/10 worst pain    Time  6    Period  Weeks    Status  Achieved      PT LONG TERM GOAL #4   Title  Patient will be able to ambulate for 1 hour to better walk at the grocery store without having to stop secondary to pain/fatigue.     Baseline  Able to amb 73min; 09/02/2018: Able to slowly perform for 1 hour- normal pace for 10 min    Time  6    Period  Weeks    Status  On-going            Plan - 10/09/18 1155    Clinical Impression Statement  Pt reports an increase in LBP and "stiffness" in the back. Pt had a decrease in stiffness in low back after treatment session. Pt was able to complete all exercises with no breaks. Pt continues to display LE weakness and can beenfit from skilled treatment to improve LE strength to return to PLOF and to be independent with regular exercise routine after discharge.     Clinical Impairments Affecting Rehab Potential  (+) highly motivated, active lifestyle (-) chronicity, age    PT Frequency  2x / week    PT Duration  6 weeks    PT Treatment/Interventions  Gait training;Stair training;Functional mobility training;Therapeutic activities;Therapeutic exercise;Balance training;Electrical Stimulation;Cryotherapy;Iontophoresis 4mg /ml Dexamethasone;Moist Heat;Traction;Patient/family education;Manual techniques;Neuromuscular re-education;Passive range of motion;Dry needling    PT Next Visit Plan  progress exercise performance and technique    PT Home Exercise Plan  See education section(s)    Consulted and Agree with Plan of Care  Patient        Patient will benefit from skilled therapeutic intervention in order to improve the following deficits and impairments:  Decreased endurance, Decreased activity tolerance, Decreased balance, Pain, Decreased range of motion, Decreased strength, Difficulty walking, Abnormal gait, Increased fascial restricitons, Decreased coordination, Increased muscle spasms  Visit Diagnosis: Chronic right-sided low back pain without sciatica  Muscle weakness (generalized)  Muscle spasm of back     Problem List Patient Active Problem List  Diagnosis Date Noted  . Hyponatremia 01/08/2018  . Meniere's disease 01/08/2018  . 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 Lower extremity edema 08/09/2017  . Gait instability 08/09/2017  . Choking sensation 06/21/2017  . Inclusion cyst of vulva 05/14/2017  . Spasm of thoracic back muscle 05/11/2017  . Vulvar lesion 03/05/2017  . Situational anxiety 02/09/2017  . Puncture wound 01/05/2017  . Chronic insomnia 01/02/2017  . Counseling regarding end of life decision making 12/18/2014  . Raynaud phenomenon 08/31/2014  . Chronic allergic conjunctivitis 03/15/2014  . PAD (peripheral artery disease) ([place]) 03/05/2013  . Chronic back pain greater than 3 months duration 08/13/2012  . S/P ankle fusion 02/06/2011  . TEMPOROMANDIBULAR JOINT PAIN 05/20/2010  . Carotid stenosis 06/30/2009  . Multiple thyroid nodules 03/15/2009  . Obstructive sleep apnea 03/05/2009  . HEMORRHOIDS, INTERNAL, WITH BLEEDING 05/09/2007  . VERTIGO 12/18/2006  . COLONIC POLYPS 12/17/2006  . HYPERCHOLESTEROLEMIA 12/17/2006  . Essential hypertension 12/17/2006  . DIVERTICULAR DISEASE 12/17/2006  . VAGINITIS, ATROPHIC 12/17/2006  . Osteoarthritis, generalized 12/17/2006  . SCOLIOSIS 12/17/2006  . Seasonal and perennial allergic rhinitis 12/17/2006    Rachael Fee, SPT 10/09/2018, 12:02 PM  [facility] [address], [place], [address] Phone: [phone]   Fax:  [phone]  Name: [name] [name] MRN: [id number] Date of Birth: Oct 29, 1940

## 2018-10-16 ENCOUNTER — Ambulatory Visit: Payer: Medicare Other

## 2018-10-16 DIAGNOSIS — M62830 Muscle spasm of back: Secondary | ICD-10-CM

## 2018-10-16 DIAGNOSIS — M545 Low back pain: Secondary | ICD-10-CM | POA: Diagnosis not present

## 2018-10-16 DIAGNOSIS — G8929 Other chronic pain: Secondary | ICD-10-CM

## 2018-10-16 DIAGNOSIS — M6281 Muscle weakness (generalized): Secondary | ICD-10-CM

## 2018-10-16 NOTE — Therapy (Signed)
[facility] [address] [place], [address] Phone: [phone]   Fax:  [phone]  Physical Therapy Treatment/ Progress Note  Patient Details  Name: [name] [name] MRN: [id number] Date of Birth: 1941/03/02 Referring Provider (PT): Amy Diona Browner   Encounter Date: 10/16/2018  PT End of Session - 10/16/18 1045    Visit Number  32    Number of Visits  33    Date for PT Re-Evaluation  10/14/18    Authorization Type  8/ 10 G Code    PT Start Time  0945    PT Stop Time  1030    PT Time Calculation (min)  45 min    Activity Tolerance  Patient tolerated treatment well    Behavior During Therapy  [facility] for tasks assessed/performed       Past Medical History:  Diagnosis Date  . Arthritis   . Chronic lower back pain   . Diverticulitis   . GERD (gastroesophageal reflux disease)   . Hemorrhoids, internal, with bleeding   . Hiatal hernia   . History of colon polyps   . HLD (hyperlipidemia)   . Hypertension   . OSA (obstructive sleep apnea)   . PUD (peptic ulcer disease)   . Scoliosis   . Thyroid disease    hypo  . Thyroid nodule   . Vaginitis, atrophic   . Vertigo     Past Surgical History:  Procedure Laterality Date  . ANKLE FUSION  1967   LEFT  . CHOLECYSTECTOMY     2004  . COLONOSCOPY  06/22/2011   diverticulosis (no adenomas since 2001)  . FRACTURE SURGERY Left 1967   foot and ankle surgery    There were no vitals filed for this visit.  Subjective Assessment - 10/16/18 1022    Subjective  Patient reports her back is feeling a little stiff today. Patient states she went to silver sneakers over at the hospital last week. Patinet reports she is excited to exercise.     Pertinent History  ankle fusion on the R side, lumbar scoliosis, menieres disease    Limitations  Lifting;Walking;Standing    How long can you walk comfortably?  Immediate pain    Patient Stated Goals  To decrease pain    Currently in Pain?  Yes    Pain Score  3     Pain Location  Back    Pain Orientation  Lower    Pain Descriptors / Indicators  Aching    Pain Type  Chronic pain    Pain Onset  More than a month ago    Pain Frequency  Intermittent       TREATMENT THERAPEUTIC EXERCISE             Standing lumbar rotation with green physioball to stretch back and paraspinals upward rotations: L/R 20 per side             Standing green physioball overhead shoulder flexion paraspinals: 1x20  Seated ball push out with clear ball prayer stretch - x 20; x 20 with rotational motion at the end range Upward chops with YTB - x 20 Downward chops with BlueTB in standing - x 20  Standing hip extension with at hip machine : x20 25# Standing hip abduction with at hip machine : x20 25#    Therapeutic exercise to stretch posterior chain musculature and to improve lumbar and thoracic spine mobility. Exercises to improve hip strength to benefit LE  strength and stabilization to benefit patient with ADL's such as community ambulation and sitting to standing. Lumbar and thoracic strength exercises to improve posture and stability.    PT Education - 10/16/18 1044    Education Details  form/technique with exercise; HEP; Scapular retraction rows  in sitting     Person(s) Educated  Patient    Methods  Explanation;Demonstration    Comprehension  Verbalized understanding;Returned demonstration          PT Long Term Goals - 10/16/18 1057      PT LONG TERM GOAL #1   Title  Patient will be independent with exercise performance and progression to continue benefits of therapy after discharge    Baseline  form/technique with exercise; 06/17/2018: Moderate cueing on form/technique; 07/29/2018: minimal cueing to perform; 09/02/2018: Independent    Time  6    Period  Weeks    Status  Achieved      PT LONG TERM GOAL #2   Title  Patient will improve 16mwt to over 23m/s to indicate a decrease in fall risk with performing community  ambulation    Baseline  .71 m/s; 06/17/2018: .41m/s; 07/29/2018: .53m/s; 09/02/2018: .17m/s; 10/15/2018: .9 m/s    Time  6    Period  Weeks    Status  On-going      PT LONG TERM GOAL #3   Title  Patient will have a worst pain of 3/10 to indicate significant improvement and greater ability to perform standing with less pain allowing for performance of greater amount of ADLs.     Baseline  7/10 worst pain; 06/17/2018: 5/10 worst pain; 07/29/2018: 3/10 worst pain    Time  6    Period  Weeks    Status  Achieved      PT LONG TERM GOAL #4   Title  Patient will be able to ambulate for 1 hour to better walk at the grocery store without having to stop secondary to pain/fatigue.     Baseline  Able to amb 50min; 09/02/2018: Able to slowly perform for 1 hour- normal pace for 10 min; 10/16/2018: able to ambulate for longer distances without increase in pain on most days     Time  6    Period  Weeks    Status  Achieved            Plan - 10/16/18 1109    Clinical Impression Statement  Patient is making progress towards long term goals with overall less pain, improvement in gait speed, and ability to ambulate for longer periods of time without increase in pain. Although patient is improving, she continues to demonstrate a decreased gait speed and continued pain with prolonged walking. Will continue to address these issues and then discharge to exercise program.    Clinical Impairments Affecting Rehab Potential  (+) highly motivated, active lifestyle (-) chronicity, age    PT Frequency  2x / week    PT Duration  6 weeks    PT Treatment/Interventions  Gait training;Stair training;Functional mobility training;Therapeutic activities;Therapeutic exercise;Balance training;Electrical Stimulation;Cryotherapy;Iontophoresis 4mg /ml Dexamethasone;Moist Heat;Traction;Patient/family education;Manual techniques;Neuromuscular re-education;Passive range of motion;Dry needling    PT Next Visit Plan  progress exercise  performance and technique    PT Home Exercise Plan  See education section(s)    Consulted and Agree with Plan of Care  Patient       Patient will benefit from skilled therapeutic intervention in order to improve the following deficits and impairments:  Decreased endurance, Decreased activity tolerance, Decreased  balance, Pain, Decreased range of motion, Decreased strength, Difficulty walking, Abnormal gait, Increased fascial restricitons, Decreased coordination, Increased muscle spasms  Visit Diagnosis: Chronic right-sided low back pain without sciatica  Muscle weakness (generalized)  Muscle spasm of back     Problem List Patient Active Problem List   Diagnosis Date Noted  . Hyponatremia 01/08/2018  . Meniere's disease 01/08/2018  . 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 Lower extremity edema 08/09/2017  . Gait instability 08/09/2017  . Choking sensation 06/21/2017  . Inclusion cyst of vulva 05/14/2017  . Spasm of thoracic back muscle 05/11/2017  . Vulvar lesion 03/05/2017  . Situational anxiety 02/09/2017  . Puncture wound 01/05/2017  . Chronic insomnia 01/02/2017  . Counseling regarding end of life decision making 12/18/2014  . Raynaud phenomenon 08/31/2014  . Chronic allergic conjunctivitis 03/15/2014  . PAD (peripheral artery disease) ([place]) 03/05/2013  . Chronic back pain greater than 3 months duration 08/13/2012  . S/P ankle fusion 02/06/2011  . TEMPOROMANDIBULAR JOINT PAIN 05/20/2010  . Carotid stenosis 06/30/2009  . Multiple thyroid nodules 03/15/2009  . Obstructive sleep apnea 03/05/2009  . HEMORRHOIDS, INTERNAL, WITH BLEEDING 05/09/2007  . VERTIGO 12/18/2006  . COLONIC POLYPS 12/17/2006  . HYPERCHOLESTEROLEMIA 12/17/2006  . Essential hypertension 12/17/2006  . DIVERTICULAR DISEASE 12/17/2006  . VAGINITIS, ATROPHIC 12/17/2006  . Osteoarthritis, generalized 12/17/2006  . SCOLIOSIS 12/17/2006  . Seasonal and perennial allergic rhinitis  12/17/2006    Blythe Stanford, PT DPT 10/16/2018, 11:34 AM  [facility] [address], [place], [address] Phone: [phone]   Fax:  [phone]  Name: Mashayla Lavin MRN: [id number] Date of Birth: [birth year]

## 2018-10-24 ENCOUNTER — Ambulatory Visit: Payer: Medicare Other

## 2018-10-24 ENCOUNTER — Telehealth: Payer: Self-pay | Admitting: Family Medicine

## 2018-10-24 DIAGNOSIS — E7800 Pure hypercholesterolemia, unspecified: Secondary | ICD-10-CM

## 2018-10-24 DIAGNOSIS — M62830 Muscle spasm of back: Secondary | ICD-10-CM

## 2018-10-24 DIAGNOSIS — M545 Low back pain, unspecified: Secondary | ICD-10-CM

## 2018-10-24 DIAGNOSIS — G8929 Other chronic pain: Secondary | ICD-10-CM

## 2018-10-24 DIAGNOSIS — M6281 Muscle weakness (generalized): Secondary | ICD-10-CM

## 2018-10-24 NOTE — Therapy (Signed)
[facility] [address], [place], [address] Phone: [phone]   Fax:  [phone]  Physical Therapy Treatment  Patient Details  Name: [name] [name] MRN: [id number] Date of Birth: 1941/02/17 Referring Provider (PT): Eliezer Lofts   Encounter Date: 10/24/2018  PT End of Session - 10/24/18 1054    Visit Number  33    Number of Visits  41    Date for PT Re-Evaluation  11/13/18    Authorization Type  1/10 G code    PT Start Time  0945    PT Stop Time  1030    PT Time Calculation (min)  45 min    Activity Tolerance  Patient tolerated treatment well    Behavior During Therapy  [facility] for tasks assessed/performed       Past Medical History:  Diagnosis Date  . Arthritis   . Chronic lower back pain   . Diverticulitis   . GERD (gastroesophageal reflux disease)   . Hemorrhoids, internal, with bleeding   . Hiatal hernia   . History of colon polyps   . HLD (hyperlipidemia)   . Hypertension   . OSA (obstructive sleep apnea)   . PUD (peptic ulcer disease)   . Scoliosis   . Thyroid disease    hypo  . Thyroid nodule   . Vaginitis, atrophic   . Vertigo     Past Surgical History:  Procedure Laterality Date  . ANKLE FUSION  1967   LEFT  . CHOLECYSTECTOMY     2004  . COLONOSCOPY  06/22/2011   diverticulosis (no adenomas since 2001)  . FRACTURE SURGERY Left 1967   foot and ankle surgery    There were no vitals filed for this visit.  Subjective Assessment - 10/24/18 0956    Subjective  Patient states increased lumbar pain today after walking for long periods of time yesterday.     Pertinent History  ankle fusion on the R side, lumbar scoliosis, menieres disease    Limitations  Lifting;Walking;Standing    How long can you walk comfortably?  Immediate pain    Patient Stated Goals  To decrease pain    Currently in Pain?  Yes    Pain Score  4     Pain Location  Back    Pain Orientation  Lower    Pain  Descriptors / Indicators  Aching    Pain Type  Chronic pain    Pain Onset  More than a month ago    Pain Frequency  Intermittent       TREATMENT THERAPEUTIC EXERCISE             Standing lumbar rotation with green physioball to stretch back and paraspinals upward rotations: L/R 20 per side Upward chops with YTB - x 20 Standing hip extension with at hip machine : x20 25# Standing hip abduction with at hip machine : x20 25#             Standing green physioball overhead shoulder flexion paraspinals with sit to stand: 1x20   Standing lumbar extension with performing lumbar lateral flexion - x 20  Seated ball push out with clear ball prayer stretch - x 20; x 20 with rotational motion at the end range Downward chops with BlueTB in standing - x 20  Nustep in sitting - seat level: 9 - level 3 resistance level - x 8min Standing squats with UE support - x 20  Therapeutic exercise to stretch posterior chain musculature and to improve lumbar and thoracic spine mobility. Exercises to improve hip strength to benefit LE strength and stabilization to benefit patient with ADL's such as community ambulation and sitting to standing. Lumbar and thoracic strength exercises to improve posture and stability.   PT Education - 10/24/18 1044    Education Details  form/technique with exercise; importance of extension based exercise performance     Person(s) Educated  Patient    Methods  Explanation;Demonstration    Comprehension  Verbalized understanding;Returned demonstration          PT Long Term Goals - 10/16/18 1057      PT LONG TERM GOAL #1   Title  Patient will be independent with exercise performance and progression to continue benefits of therapy after discharge    Baseline  form/technique with exercise; 06/17/2018: Moderate cueing on form/technique; 07/29/2018: minimal cueing to perform; 09/02/2018: Independent    Time  6    Period  Weeks    Status  Achieved      PT LONG TERM GOAL #2    Title  Patient will improve 66mwt to over 34m/s to indicate a decrease in fall risk with performing community ambulation    Baseline  .71 m/s; 06/17/2018: .2m/s; 07/29/2018: .58m/s; 09/02/2018: .1m/s; 10/15/2018: .9 m/s    Time  6    Period  Weeks    Status  On-going      PT LONG TERM GOAL #3   Title  Patient will have a worst pain of 3/10 to indicate significant improvement and greater ability to perform standing with less pain allowing for performance of greater amount of ADLs.     Baseline  7/10 worst pain; 06/17/2018: 5/10 worst pain; 07/29/2018: 3/10 worst pain    Time  6    Period  Weeks    Status  Achieved      PT LONG TERM GOAL #4   Title  Patient will be able to ambulate for 1 hour to better walk at the grocery store without having to stop secondary to pain/fatigue.     Baseline  Able to amb 37min; 09/02/2018: Able to slowly perform for 1 hour- normal pace for 10 min; 10/16/2018: able to ambulate for longer distances without increase in pain on most days     Time  6    Period  Weeks    Status  Achieved            Plan - 10/24/18 1108    Clinical Impression Statement  Patient demonstrates decreased pain after performing extension and rotational based lumbar exercises. Patient demosntrates improvement overall with ability to perform greater amount of exercises with overall less breaks. Patient continues to demonstrate increased pain with prolonged ambulation but is able to manage her pain with exercises. Will f/u next week to address limitations with HEP and discharge after N/V.      Clinical Impairments Affecting Rehab Potential  (+) highly motivated, active lifestyle (-) chronicity, age    PT Frequency  2x / week    PT Duration  6 weeks    PT Treatment/Interventions  Gait training;Stair training;Functional mobility training;Therapeutic activities;Therapeutic exercise;Balance training;Electrical Stimulation;Cryotherapy;Iontophoresis 4mg /ml Dexamethasone;Moist  Heat;Traction;Patient/family education;Manual techniques;Neuromuscular re-education;Passive range of motion;Dry needling    PT Next Visit Plan  progress exercise performance and technique    PT Home Exercise Plan  See education section(s)    Consulted and Agree with Plan of Care  Patient  Patient will benefit from skilled therapeutic intervention in order to improve the following deficits and impairments:  Decreased endurance, Decreased activity tolerance, Decreased balance, Pain, Decreased range of motion, Decreased strength, Difficulty walking, Abnormal gait, Increased fascial restricitons, Decreased coordination, Increased muscle spasms  Visit Diagnosis: Muscle weakness (generalized)  Chronic right-sided low back pain without sciatica  Muscle spasm of back     Problem List Patient Active Problem List   Diagnosis Date Noted  . Hyponatremia 01/08/2018  . Meniere's disease 01/08/2018  . 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 Lower extremity edema 08/09/2017  . Gait instability 08/09/2017  . Choking sensation 06/21/2017  . Inclusion cyst of vulva 05/14/2017  . Spasm of thoracic back muscle 05/11/2017  . Vulvar lesion 03/05/2017  . Situational anxiety 02/09/2017  . Puncture wound 01/05/2017  . Chronic insomnia 01/02/2017  . Counseling regarding end of life decision making 12/18/2014  . Raynaud phenomenon 08/31/2014  . Chronic allergic conjunctivitis 03/15/2014  . PAD (peripheral artery disease) ([place]) 03/05/2013  . Chronic back pain greater than 3 months duration 08/13/2012  . S/P ankle fusion 02/06/2011  . TEMPOROMANDIBULAR JOINT PAIN 05/20/2010  . Carotid stenosis 06/30/2009  . Multiple thyroid nodules 03/15/2009  . Obstructive sleep apnea 03/05/2009  . HEMORRHOIDS, INTERNAL, WITH BLEEDING 05/09/2007  . VERTIGO 12/18/2006  . COLONIC POLYPS 12/17/2006  . HYPERCHOLESTEROLEMIA 12/17/2006  . Essential hypertension 12/17/2006  . DIVERTICULAR DISEASE  12/17/2006  . VAGINITIS, ATROPHIC 12/17/2006  . Osteoarthritis, generalized 12/17/2006  . SCOLIOSIS 12/17/2006  . Seasonal and perennial allergic rhinitis 12/17/2006    Blythe Stanford, PT DPT 10/24/2018, 1:04 PM  [facility] [address], [place], [address] Phone: [phone]   Fax:  [phone]  Name: Mitra Duling MRN: [id number] Date of Birth: 1940/10/30

## 2018-10-24 NOTE — Telephone Encounter (Signed)
-----   Message from Ellamae Sia sent at 10/16/2018  2:31 PM EST ----- Regarding: Lab orders for Friday, 2.21.20 Lab orders for a 3 month follow up appt.

## 2018-10-25 ENCOUNTER — Other Ambulatory Visit: Payer: Medicare Other

## 2018-10-28 ENCOUNTER — Other Ambulatory Visit (INDEPENDENT_AMBULATORY_CARE_PROVIDER_SITE_OTHER): Payer: Medicare Other

## 2018-10-28 DIAGNOSIS — E7800 Pure hypercholesterolemia, unspecified: Secondary | ICD-10-CM | POA: Diagnosis not present

## 2018-10-28 LAB — COMPREHENSIVE METABOLIC PANEL
ALT: 32 U/L (ref 0–35)
AST: 20 U/L (ref 0–37)
Albumin: 4.8 g/dL (ref 3.5–5.2)
Alkaline Phosphatase: 111 U/L (ref 39–117)
BUN: 23 mg/dL (ref 6–23)
CO2: 29 mEq/L (ref 19–32)
CREATININE: 0.84 mg/dL (ref 0.40–1.20)
Calcium: 9.6 mg/dL (ref 8.4–10.5)
Chloride: 97 mEq/L (ref 96–112)
GFR: 65.37 mL/min (ref >60.00)
Glucose, Bld: 83 mg/dL (ref 70–99)
Potassium: 3.9 mEq/L (ref 3.5–5.1)
SODIUM: 135 meq/L (ref 135–145)
TOTAL PROTEIN: 7.6 g/dL (ref 6.0–8.3)
Total Bilirubin: 0.5 mg/dL (ref 0.2–1.2)

## 2018-10-28 LAB — LIPID PANEL
Cholesterol: 200 mg/dL (ref 0–200)
HDL: 72.6 mg/dL (ref >39.00)
NonHDL: 126.93
Total CHOL/HDL Ratio: 3
Triglycerides: 229 mg/dL — ABNORMAL HIGH (ref 0.0–149.0)
VLDL: 45.8 mg/dL — ABNORMAL HIGH (ref 0.0–40.0)

## 2018-10-28 LAB — LDL CHOLESTEROL, DIRECT: Direct LDL: 102 mg/dL

## 2018-10-30 ENCOUNTER — Telehealth: Payer: Self-pay | Admitting: Family Medicine

## 2018-10-30 ENCOUNTER — Ambulatory Visit: Payer: Medicare Other

## 2018-10-30 DIAGNOSIS — M545 Low back pain: Secondary | ICD-10-CM | POA: Diagnosis not present

## 2018-10-30 DIAGNOSIS — M6281 Muscle weakness (generalized): Secondary | ICD-10-CM

## 2018-10-30 DIAGNOSIS — M62830 Muscle spasm of back: Secondary | ICD-10-CM

## 2018-10-30 DIAGNOSIS — G8929 Other chronic pain: Secondary | ICD-10-CM

## 2018-10-30 NOTE — Telephone Encounter (Signed)
Pt need to speak to nurse concerning her labs she had done recently. Pt stated she was only suppose to have her sodium checked but other labs was done also, please call pt

## 2018-10-30 NOTE — Therapy (Signed)
[facility] [address], [place], [address] Phone: [phone]   Fax:  [phone]  Physical Therapy Treatment/ Discharge Summary  Patient Details  Name: [name] [name] MRN: [id number] Date of Birth: 1940/12/17 Referring Provider (PT): Amy Diona Browner   Encounter Date: 10/30/2018  PT End of Session - 10/30/18 1330    Visit Number  34    Number of Visits  41    Date for PT Re-Evaluation  11/13/18    Authorization Type  2/10 G code    PT Start Time  1115    PT Stop Time  1205    PT Time Calculation (min)  50 min    Activity Tolerance  Patient tolerated treatment well    Behavior During Therapy  [facility] for tasks assessed/performed       Past Medical History:  Diagnosis Date  . Arthritis   . Chronic lower back pain   . Diverticulitis   . GERD (gastroesophageal reflux disease)   . Hemorrhoids, internal, with bleeding   . Hiatal hernia   . History of colon polyps   . HLD (hyperlipidemia)   . Hypertension   . OSA (obstructive sleep apnea)   . PUD (peptic ulcer disease)   . Scoliosis   . Thyroid disease    hypo  . Thyroid nodule   . Vaginitis, atrophic   . Vertigo     Past Surgical History:  Procedure Laterality Date  . ANKLE FUSION  1967   LEFT  . CHOLECYSTECTOMY     2004  . COLONOSCOPY  06/22/2011   diverticulosis (no adenomas since 2001)  . FRACTURE SURGERY Left 1967   foot and ankle surgery    There were no vitals filed for this visit.  Subjective Assessment - 10/30/18 1328    Subjective  Patient reports she is prepared for discharge but would like to review a few exercises to be performed in the gym and at home.     Pertinent History  ankle fusion on the R side, lumbar scoliosis, menieres disease    Limitations  Lifting;Walking;Standing    How long can you walk comfortably?  Immediate pain    Patient Stated Goals  To decrease pain    Currently in Pain?  Yes    Pain Score  2     Pain  Location  Back    Pain Orientation  Lower    Pain Descriptors / Indicators  Aching    Pain Type  Chronic pain    Pain Onset  More than a month ago    Pain Frequency  Intermittent       TREATMENT  Therapeutic Exercise: Seated prayer stretch with physioball -- x 20 with rotations at end range Overhead ball reaches with 1kg ball -- x 20 with rotations in standing  Standing scapular rows -- x 20 at [place] with RTB Hip abduction in standing with YTB around ankles -- x 20 Hip extension in standing with YTB around ankle -- x 20  Sit to stands without UE support -- x 20   Performed exercises to be added to HEP and gym exercises she can 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 Patient required minimal cueing to perform.    PT Education - 10/30/18 1330    Education Details  Review of HEP for home and gym performance    Person(s) Educated  Patient    Methods  Explanation;Demonstration    Comprehension  Verbalized  understanding;Returned demonstration          PT Long Term Goals - 10/30/18 1333      PT LONG TERM GOAL #1   Title  Patient will be independent with exercise performance and progression to continue benefits of therapy after discharge    Baseline  form/technique with exercise; 06/17/2018: Moderate cueing on form/technique; 07/29/2018: minimal cueing to perform; 09/02/2018: Independent    Time  6    Period  Weeks    Status  Achieved      PT LONG TERM GOAL #2   Title  Patient will improve 28mt to over 116m to indicate a decrease in fall risk with performing community ambulation    Baseline  .71 m/s; 06/17/2018: .7570m 07/29/2018: .54m40m12/30/2019: .53m/25m/07/2019: .9 m/s    Time  6    Period  Weeks    Status  Partially Met      PT LONG TERM GOAL #3   Title  Patient will have a worst pain of 3/10 to indicate significant improvement and greater ability to perform standing with less pain allowing for performance of greater amount of ADLs.     Baseline   7/10 worst pain; 06/17/2018: 5/10 worst pain; 07/29/2018: 3/10 worst pain    Time  6    Period  Weeks    Status  Achieved      PT LONG TERM GOAL #4   Title  Patient will be able to ambulate for 1 hour to better walk at the grocery store without having to stop secondary to pain/fatigue.     Baseline  Able to amb 25min27m/30/2019: Able to slowly perform for 1 hour- normal pace for 10 min; 10/16/2018: able to ambulate for longer distances without increase in pain on most days     Time  6    Period  Weeks    Status  Achieved            Plan - 10/30/18 1330    Clinical Impression Statement  Patient has either met or partially met all long term goals and is able to perform most exercises without required cueing to be performed by the physical therapist. Patient demosntrates ability to change exercise performance to perform exercises at a greater acapacity compared to previous session and is preapared to discharge.     Clinical Impairments Affecting Rehab Potential  (+) highly motivated, active lifestyle (-) chronicity, age    PT Frequency  2x / week    PT Duration  6 weeks    PT Treatment/Interventions  Gait training;Stair training;Functional mobility training;Therapeutic activities;Therapeutic exercise;Balance training;Electrical Stimulation;Cryotherapy;Iontophoresis 4mg/ml19mxamethasone;Moist Heat;Traction;Patient/family education;Manual techniques;Neuromuscular re-education;Passive range of motion;Dry needling    PT Next Visit Plan  progress exercise performance and technique    PT Home Exercise Plan  See education section(s)    Consulted and Agree with Plan of Care  Patient       Patient will benefit from skilled therapeutic intervention in order to improve the following deficits and impairments:  Decreased endurance, Decreased activity tolerance, Decreased balance, Pain, Decreased range of motion, Decreased strength, Difficulty walking, Abnormal gait, Increased fascial restricitons,  Decreased coordination, Increased muscle spasms  Visit Diagnosis: Muscle weakness (generalized)  Muscle spasm of back  Chronic right-sided low back pain without sciatica     Problem List Patient Active Problem List   Diagnosis Date Noted  . Hyponatremia 01/08/2018  . Meniere's disease 01/08/2018  . 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 Lower extremity edema  08/09/2017  . Gait instability 08/09/2017  . Choking sensation 06/21/2017  . Inclusion cyst of vulva 05/14/2017  . Spasm of thoracic back muscle 05/11/2017  . Vulvar lesion 03/05/2017  . Situational anxiety 02/09/2017  . Puncture wound 01/05/2017  . Chronic insomnia 01/02/2017  . Counseling regarding end of life decision making 12/18/2014  . Raynaud phenomenon 08/31/2014  . Chronic allergic conjunctivitis 03/15/2014  . PAD (peripheral artery disease) ([place]) 03/05/2013  . Chronic back pain greater than 3 months duration 08/13/2012  . S/P ankle fusion 02/06/2011  . TEMPOROMANDIBULAR JOINT PAIN 05/20/2010  . Carotid stenosis 06/30/2009  . Multiple thyroid nodules 03/15/2009  . Obstructive sleep apnea 03/05/2009  . HEMORRHOIDS, INTERNAL, WITH BLEEDING 05/09/2007  . VERTIGO 12/18/2006  . COLONIC POLYPS 12/17/2006  . HYPERCHOLESTEROLEMIA 12/17/2006  . Essential hypertension 12/17/2006  . DIVERTICULAR DISEASE 12/17/2006  . VAGINITIS, ATROPHIC 12/17/2006  . Osteoarthritis, generalized 12/17/2006  . SCOLIOSIS 12/17/2006  . Seasonal and perennial allergic rhinitis 12/17/2006    Blythe Stanford, PT DPT 10/30/2018, 1:39 PM  [facility] [address] [place], [address] Phone: [phone]   Fax:  [phone]  Name: [name] [name] MRN: [id number] Date of Birth: [birth year]

## 2018-10-30 NOTE — Telephone Encounter (Signed)
Not sure why other labs were drawn unless Dr. Diona Browner went ahead and did her 6 month follow up labs that is scheduled for May.  Will forward to Dr. Diona Browner to advise.

## 2018-10-31 NOTE — Telephone Encounter (Signed)
Request to credit lipid has been sent

## 2018-10-31 NOTE — Telephone Encounter (Signed)
Noted  

## 2018-10-31 NOTE — Telephone Encounter (Signed)
Yes that was it, I mistakenly ordered lipids and the rest of the BMET early when I saw her next  Appt. Date. Please  Apologize to pt for me and  I will send a note to Terri to  Request that we try to have the labs have no charge.  Of note her sodium is normal, but her chloe sterol is much worse... she had been doing much better with it 3 months ago.. return to previous healthy lifestyle.

## 2018-10-31 NOTE — Telephone Encounter (Signed)
[name] [name] notified as instructed by telephone.   She states that she was not fasting and had recently eaten some beef, pork and cheesecake at a [employer].  She just wanted to make Dr. Diona Browner aware of why her cholesterol was so bad.

## 2018-11-12 ENCOUNTER — Other Ambulatory Visit: Payer: Self-pay | Admitting: Family Medicine

## 2018-11-12 NOTE — Telephone Encounter (Signed)
Last office visit 07/12/2018 for follow up.  Last refilled 08/26/2018 for #60 with no refills.  Next Appt: 01/14/2019 for 6 month follow up.

## 2018-11-13 ENCOUNTER — Other Ambulatory Visit: Payer: Self-pay

## 2018-11-13 ENCOUNTER — Encounter: Payer: Self-pay | Admitting: Podiatry

## 2018-11-13 ENCOUNTER — Ambulatory Visit (INDEPENDENT_AMBULATORY_CARE_PROVIDER_SITE_OTHER): Payer: Medicare Other | Admitting: Podiatry

## 2018-11-13 DIAGNOSIS — M79676 Pain in unspecified toe(s): Secondary | ICD-10-CM

## 2018-11-13 DIAGNOSIS — B351 Tinea unguium: Secondary | ICD-10-CM | POA: Diagnosis not present

## 2018-11-13 DIAGNOSIS — Q828 Other specified congenital malformations of skin: Secondary | ICD-10-CM

## 2018-11-13 NOTE — Progress Notes (Signed)
She presents today with chief complaint of painful elongated toenails and calluses bilaterally.  Objective: Vital signs are stable alert and oriented x3.  Pulses are palpable.  Toenails are long thick yellow dystrophic-like mycotic sharply incurvated with reactive hyperkeratotic tylomas plantar aspect of the forefoot bilaterally.  No open lesions or wounds are noted.  Assessment: Pain in limb secondary to porokeratosis/tyloma and pain in limb secondary to onychomycosis.  Plan: Reduced all reactive hyperkeratotic tissue and reduced toenails 1 through 5 bilateral.

## 2018-11-26 ENCOUNTER — Ambulatory Visit: Payer: Medicare Other | Admitting: Cardiovascular Disease

## 2018-12-09 ENCOUNTER — Other Ambulatory Visit: Payer: Self-pay | Admitting: Family Medicine

## 2018-12-09 NOTE — Telephone Encounter (Signed)
Last office visit 07/12/2018 for HTN.   Last refilled 11/12/2018 for #60 with no refills.  Next Appt: 01/14/2019 for 6 month follow up.

## 2018-12-10 ENCOUNTER — Ambulatory Visit: Payer: Medicare Other | Admitting: Cardiovascular Disease

## 2018-12-19 ENCOUNTER — Ambulatory Visit (INDEPENDENT_AMBULATORY_CARE_PROVIDER_SITE_OTHER): Payer: Medicare Other | Admitting: Family Medicine

## 2018-12-19 ENCOUNTER — Encounter: Payer: Self-pay | Admitting: Family Medicine

## 2018-12-19 DIAGNOSIS — R1030 Lower abdominal pain, unspecified: Secondary | ICD-10-CM | POA: Diagnosis not present

## 2018-12-19 NOTE — Patient Instructions (Signed)
Return to regular.  Call if pain in abdomen returning

## 2018-12-19 NOTE — Progress Notes (Signed)
VIRTUAL VISIT Due to national recommendations of social distancing due to [place] 19, a virtual visit is felt to be most appropriate for this patient at this time.   I connected with the patient on 12/19/18 at 11:45 AM EDT by virtual telehealth platform and verified that I am speaking with the correct person using two identifiers.   I discussed the limitations, risks, security and privacy concerns of performing an evaluation and management service by  virtual telehealth platform and the availability of in person appointments. I also discussed with the patient that there may be a patient responsible charge related to this service. The patient expressed understanding and agreed to proceed.  Patient location: Home Provider Location: [place] Participants: Eliezer Lofts and Charna Archer   Chief Complaint  Patient presents with  . Colon Spasms  . Frequent Bowel Movements  . Chills    History of Present Illness: 78 year old female with history of PUD, diverticulitis flares who presents with new onset spasming of abdomen 4 days ago  1 hour after lunch. Ate usual meal, low fat.  Followed by the next day have pencil like small, soft BMs, frequently.  Today feeling better.. loose stool, as well as pellets.  Pain was across low abdomen. On pain scale 8/10 now 0/10.   When she presses left lower quadrant it is not painful.   No blood, no fever. No N/V.   No new meds. No diet changes except eating more raw pears. Had to switch to yellow onions. Was eating a lot lately  No recent antibiotics. NO sick contacts.   She has not taken any meds.     COVID 19 screen No recent travel or known exposure to COVID19 The patient denies respiratory symptoms of COVID 19 at this time.  The importance of social distancing was discussed today.   Review of Systems  Constitutional: Negative for chills and fever.  HENT: Negative for congestion and ear pain.   Eyes: Negative for pain and redness.   Respiratory: Negative for cough and shortness of breath.   Cardiovascular: Negative for chest pain, palpitations and leg swelling.  Gastrointestinal: Positive for abdominal pain. Negative for blood in stool, constipation, diarrhea, nausea and vomiting.  Genitourinary: Negative for dysuria.  Musculoskeletal: Negative for falls and myalgias.  Skin: Negative for rash.  Neurological: Negative for dizziness.  Psychiatric/Behavioral: Negative for depression. The patient is not nervous/anxious.       Past Medical History:  Diagnosis Date  . Arthritis   . Chronic lower back pain   . Diverticulitis   . GERD (gastroesophageal reflux disease)   . Hemorrhoids, internal, with bleeding   . Hiatal hernia   . History of colon polyps   . HLD (hyperlipidemia)   . Hypertension   . OSA (obstructive sleep apnea)   . PUD (peptic ulcer disease)   . Scoliosis   . Thyroid disease    hypo  . Thyroid nodule   . Vaginitis, atrophic   . Vertigo     reports that she has never smoked. She has never used smokeless tobacco. She reports current alcohol use. She reports that she does not use drugs.   Current Outpatient Medications:  .  aspirin 81 MG tablet, Take 81 mg by mouth daily.  , Disp: , Rfl:  .  azelastine (ASTELIN) 0.1 % nasal spray, U 2 SPRAYS IEN Q 12 H., Disp: , Rfl: 5 .  celecoxib (CELEBREX) 200 MG capsule, TAKE 1 CAPSULE(200 MG) BY MOUTH TWICE  DAILY, Disp: 180 capsule, Rfl: 0 .  DIOVAN 40 MG tablet, TAKE 3 TABLETS BY MOUTH EVERY DAY, Disp: 270 tablet, Rfl: 3 .  furosemide (LASIX) 20 MG tablet, Take 0.5 tablets (10 mg total) by mouth daily. For 3 days and as needed, Disp: 20 tablet, Rfl: 0 .  hydrochlorothiazide (HYDRODIURIL) 25 MG tablet, TAKE 1 TABLET(25 MG) BY MOUTH DAILY AS NEEDED, Disp: 90 tablet, Rfl: 1 .  loratadine (CLARITIN) 10 MG tablet, Take 10 mg by mouth daily., Disp: , Rfl:  .  NORVASC 10 MG tablet, TAKE 1 TABLET(10 MG) BY MOUTH DAILY, Disp: 90 tablet, Rfl: 1 .  pantoprazole  (PROTONIX) 40 MG tablet, TAKE 1 TABLET BY MOUTH TWICE DAILY, Disp: 180 tablet, Rfl: 1 .  Red Yeast Rice 600 MG CAPS, Take 4 capsules by mouth daily. , Disp: , Rfl:  .  VALIUM 2 MG tablet, TAKE 1/2 TO 1 TABLET BY MOUTH EVERY 12 HOURS, Disp: 60 tablet, Rfl: 0   Observations/Objective: Blood pressure 127/62, pulse 81, height 5' 2.5" (1.588 m), weight 163 lb (73.9 kg).  Physical Exam  Physical Exam Constitutional:      General: She is not in acute distress. Pulmonary:     Effort: Pulmonary effort is normal. No respiratory distress.  Neurological:     Mental Status: She is alert and oriented to person, place, and time.  Psychiatric:        Mood and Affect: Mood normal.        Behavior: Behavior normal.   Assessment and Plan  Lower abdominal pain Improved.  No red flags for emergent eval.  Most likely from diet changes and high gas producing foods.  Return to regular diet, increase fluids.  If fever, recurrent persistent abdominal pain.. pt will call to follow up with in office exam and possible imaging.  If severe pain, fever, not keeping down fluids.Marland Kitchen go to ER.     I discussed the assessment and treatment plan with the patient. The patient was provided an opportunity to ask questions and all were answered. The patient agreed with the plan and demonstrated an understanding of the instructions.   The patient was advised to call back or seek an in-person evaluation if the symptoms worsen or if the condition fails to improve as anticipated.     Eliezer Lofts, MD

## 2018-12-19 NOTE — Assessment & Plan Note (Signed)
Improved.  No red flags for emergent eval.  Most likely from diet changes and high gas producing foods.  Return to regular diet, increase fluids.  If fever, recurrent persistent abdominal pain.. pt will call to follow up with in office exam and possible imaging.  If severe pain, fever, not keeping down fluids.Marland Kitchen go to ER.

## 2019-01-07 ENCOUNTER — Other Ambulatory Visit: Payer: Self-pay | Admitting: Cardiovascular Disease

## 2019-01-07 ENCOUNTER — Other Ambulatory Visit: Payer: Medicare Other

## 2019-01-07 NOTE — Telephone Encounter (Signed)
Please advise if ok to refill Hctz 25 mg tablet qd. Medication last filled by PCP.

## 2019-01-07 NOTE — Telephone Encounter (Signed)
It looks like Dr. Diona Browner has been managing this since Dr. Rockey Situ last saw the patient. I would let Dr. Diona Browner refill until the patient is seen back by cardiology.  She is overdue for follow up- has an appt in June with Dr. Rockey Situ.  The patient has had hyponatremia which Dr. Diona Browner has addressed.

## 2019-01-07 NOTE — Telephone Encounter (Signed)
Noted. Thank You.

## 2019-01-14 ENCOUNTER — Ambulatory Visit: Payer: Medicare Other | Admitting: Family Medicine

## 2019-01-15 ENCOUNTER — Ambulatory Visit: Payer: Medicare Other | Admitting: Podiatry

## 2019-01-22 ENCOUNTER — Ambulatory Visit (INDEPENDENT_AMBULATORY_CARE_PROVIDER_SITE_OTHER): Payer: Medicare Other | Admitting: Podiatry

## 2019-01-22 ENCOUNTER — Encounter: Payer: Self-pay | Admitting: Podiatry

## 2019-01-22 ENCOUNTER — Other Ambulatory Visit: Payer: Self-pay

## 2019-01-22 VITALS — Temp 97.0°F

## 2019-01-22 DIAGNOSIS — Q828 Other specified congenital malformations of skin: Secondary | ICD-10-CM | POA: Diagnosis not present

## 2019-01-22 DIAGNOSIS — M79676 Pain in unspecified toe(s): Secondary | ICD-10-CM

## 2019-01-22 DIAGNOSIS — B351 Tinea unguium: Secondary | ICD-10-CM | POA: Diagnosis not present

## 2019-01-22 NOTE — Progress Notes (Signed)
She presents today chief complaint of painful elongated toenails 1 through 5 bilateral painful reactive hyper keratomas plantar aspect of the bilateral foot.  Objective: Vital signs are stable alert and oriented x3 pulses remain palpable.  Contracted digital deformities 1 through 5 bilaterally and gastroc equinus bilaterally.  Assessment: Leg length discrepancy gastroc equinus digital deformities pain limb second onychomycosis and reactive hyperkeratosis.  Plan: Wrote a prescription for elevated shoes 2 inches and debrided toenails 1 through 5 bilateral.

## 2019-01-25 ENCOUNTER — Other Ambulatory Visit: Payer: Self-pay | Admitting: Family Medicine

## 2019-01-28 ENCOUNTER — Telehealth: Payer: Self-pay | Admitting: *Deleted

## 2019-01-28 NOTE — Telephone Encounter (Signed)
Last office visit 12/19/2018 for Lower Abd Pain.  [facility] refilled 12/10/2018 for #60 with no refills.  Next Appt: 06/10/2019 for 6 month follow up.

## 2019-01-28 NOTE — Telephone Encounter (Signed)
She is on  Diuretics which will help control BP overnight. Some of the diovan will remain in her system for a while. Okay to skip one dose.

## 2019-01-28 NOTE — Telephone Encounter (Signed)
Patient left a voicemail stating that she is out of her blood pressure medication Diovan 40 mg. Patient stated that the pharmacy will not be able to get it in until tomorrow. Patient wants to know how serious it will be for her to go a day without her BP medication? Patient requested a call back.

## 2019-01-28 NOTE — Telephone Encounter (Signed)
Pt called back wanting to know what she should do tonight about her bp meds  Best number [phone]

## 2019-01-28 NOTE — Telephone Encounter (Signed)
  Nevermind! I just saw you are not here any longer. I talked to the patient.  She is going to talk one 80 mg her pharmacist was able to give her tonight and get  Her regular dose tommorow.

## 2019-01-28 NOTE — Telephone Encounter (Signed)
Can you call her tonight?

## 2019-02-06 ENCOUNTER — Telehealth: Payer: Self-pay

## 2019-02-06 NOTE — Telephone Encounter (Signed)
Spoke with patient.  Made patient aware that Dr. Rockey Situ is currently limiting patients coming into the office due to [place]. Offered telehealth appointment.  Patient would like to think about this.  Patient stated she would like to cancel her upcoming appointment and cancel her carotid appointment.  Patient stated she would call back after she has her calender to schedule a telehealth appointment.

## 2019-02-18 ENCOUNTER — Ambulatory Visit: Payer: Medicare Other | Admitting: Cardiovascular Disease

## 2019-02-25 ENCOUNTER — Encounter: Payer: Self-pay | Admitting: Internal Medicine

## 2019-02-26 ENCOUNTER — Telehealth: Payer: Self-pay | Admitting: Internal Medicine

## 2019-02-26 NOTE — Progress Notes (Signed)
* [facility]     Assessment and Plan:  OSA. --Doing well with CPAP, should continue.   -Currently not rinsing out supplies, discussed the importance of rinsing ulcer supplies including mask, tubing, water chamber once per week with soapy water, versus vinegar.  Will try to do this.  Return in about 1 year (around 02/27/2020).    Date: 02/26/2019  MRN# [id number] [name] [name] [birth year]   [name] [name] is a 78 y.o. old female seen in follow up for chief complaint of sleep apnea.     HPI:   [name] [name] is a 78 y.o. female  with OSA. She was recently also diagnosed with Meniere's disease and MVP.  She has been doing well with her CPAP, she no longer sleeps flat due to orthopnea.  She feels that her breathing is doing well. She is doing well with her CPAP. She has stopped using nasal spray as her symptoms have resolved with claritin.  She takes no inhaler.  She is not rinsing out supplies.   She is taking astelin one spray in each nostril every night, she also takes claritin every day.   **CPAP download 01/26/25/23/20>> raw data personally reviewed.  Usage greater than 4 hours is 30/30 days.  Average usage on days used 9 hours 6 minutes.  Pressure range 5-15.  Median pressure 9, 95 percentile pressure 13, max pressure 14.3.  Residual AHI 0.6.  Overall this shows excellent compliance with CPAP with excellent control of obstructive sleep apnea. **Download 30 days as or 02/18/18; data personally reviewed. Usage great than 4 hours is 28/30 days. Avg usage on days used is 8 hours and 29 minutes. Median pressure is 9, 95th percentile is 12, max pressure is 14.   Review of download data for 30 days shows average usage of 8 hours 35 minutes, CPAP, his AutoSet 5-15. 95th percentile pressure was 13.6. Review graphic data shows a pressure range between 12 and 14. Residual AHI is 0.5  Medication:   Outpatient Encounter Medications as of 02/27/2019  Medication Sig  . aspirin  81 MG tablet Take 81 mg by mouth daily.    Marland Kitchen azelastine (ASTELIN) 0.1 % nasal spray U 2 SPRAYS IEN Q 12 H.  . celecoxib (CELEBREX) 200 MG capsule TAKE 1 CAPSULE(200 MG) BY MOUTH TWICE DAILY  . DIOVAN 40 MG tablet TAKE 3 TABLETS BY MOUTH EVERY DAY  . furosemide (LASIX) 20 MG tablet Take 0.5 tablets (10 mg total) by mouth daily. For 3 days and as needed  . hydrochlorothiazide (HYDRODIURIL) 25 MG tablet TAKE 1 TABLET(25 MG) BY MOUTH DAILY AS NEEDED  . loratadine (CLARITIN) 10 MG tablet Take 10 mg by mouth daily.  . NORVASC 10 MG tablet TAKE 1 TABLET(10 MG) BY MOUTH DAILY  . pantoprazole (PROTONIX) 40 MG tablet TAKE 1 TABLET BY MOUTH TWICE DAILY  . Red Yeast Rice 600 MG CAPS Take 4 capsules by mouth daily.   Marland Kitchen VALIUM 2 MG tablet TAKE 1/2 TO 1 TABLET BY MOUTH EVERY 12 HOURS   No facility-administered encounter medications on file as of 02/27/2019.      Allergies:  Ace inhibitors, Neosporin [neomycin-bacitracin zn-polymyx], Statins, Tramadol, and Beta adrenergic blockers  Review of Systems: Gen:  Denies  fever, sweats. HEENT: Denies blurred vision. Cvc:  No dizziness, chest pain or heaviness Resp:   Denies cough or sputum porduction. Gi: Denies swallowing difficulty, stomach pain. constipation, bowel incontinence Gu:  Denies bladder incontinence, burning urine Ext:   No Joint pain,  stiffness. Skin: No skin rash, easy bruising. Endoc:  No polyuria, polydipsia. Psych: No depression, insomnia. Other:  All other systems were reviewed and found to be negative other than what is mentioned in the HPI.   Physical Examination:   VS: There were no vitals taken for this visit.   General Appearance: No distress  Neuro:without focal findings,  speech normal,  HEENT: PERRLA, EOM intact. Pulmonary: normal breath sounds, No wheezing.   CardiovascularNormal S1,S2.  No m/r/g.   Abdomen: Benign, Soft, non-tender. Renal:  No costovertebral tenderness  GU:  Not performed at this time. Endoc: No  evident thyromegaly, no signs of acromegaly. Skin:   warm, no rash. Extremities: normal, no cyanosis, clubbing.   LABORATORY PANEL:   CBC No results for input(s): WBC, HGB, HCT, PLT in the last 168 hours. ------------------------------------------------------------------------------------------------------------------  Chemistries  No results for input(s): NA, K, CL, CO2, GLUCOSE, BUN, CREATININE, CALCIUM, MG, AST, ALT, ALKPHOS, BILITOT in the last 168 hours.  Invalid input(s): GFRCGP ------------------------------------------------------------------------------------------------------------------  Cardiac Enzymes No results for input(s): TROPONINI in the last 168 hours. ------------------------------------------------------------  RADIOLOGY:   No results found for this or any previous visit. No results found for this or any previous visit. ------------------------------------------------------------------------------------------------------------------  Thank  you for allowing [facility] to assist in the care of your patient. Our recommendations are noted above.  Please contact us if we can be of further service.   Marda Stalker, MD.  [facility] Office Number: [phone]  Patricia Pesa, M.D.  Merton Border, M.D  02/26/2019

## 2019-02-26 NOTE — Telephone Encounter (Signed)
Spoke to Alta with [facility], who stated that pt has been updated within [facility].  Download has been printed.  Nothing further is needed.

## 2019-02-27 ENCOUNTER — Ambulatory Visit (INDEPENDENT_AMBULATORY_CARE_PROVIDER_SITE_OTHER): Payer: Medicare Other | Admitting: Internal Medicine

## 2019-02-27 DIAGNOSIS — J3089 Other allergic rhinitis: Secondary | ICD-10-CM

## 2019-02-27 DIAGNOSIS — J302 Other seasonal allergic rhinitis: Secondary | ICD-10-CM | POA: Diagnosis not present

## 2019-02-27 DIAGNOSIS — G4733 Obstructive sleep apnea (adult) (pediatric): Secondary | ICD-10-CM

## 2019-02-27 NOTE — Patient Instructions (Signed)
Continue using cpap every night.  Try rinse out your mask, water chamber, and tubing about once per week.

## 2019-03-04 ENCOUNTER — Encounter: Payer: Self-pay | Admitting: Family Medicine

## 2019-03-04 ENCOUNTER — Ambulatory Visit
Admission: RE | Admit: 2019-03-04 | Discharge: 2019-03-04 | Disposition: A | Discharge status: Home / Self Care | Payer: Medicare Other | Source: Ambulatory Visit | Attending: Family Medicine | Admitting: Family Medicine

## 2019-03-04 ENCOUNTER — Other Ambulatory Visit: Payer: Self-pay | Admitting: Family Medicine

## 2019-03-04 ENCOUNTER — Ambulatory Visit (INDEPENDENT_AMBULATORY_CARE_PROVIDER_SITE_OTHER): Payer: Medicare Other | Admitting: Family Medicine

## 2019-03-04 ENCOUNTER — Ambulatory Visit (INDEPENDENT_AMBULATORY_CARE_PROVIDER_SITE_OTHER)
Admission: RE | Admit: 2019-03-04 | Discharge: 2019-03-04 | Disposition: A | Discharge status: Home / Self Care | Payer: Medicare Other | Source: Ambulatory Visit | Attending: Family Medicine | Admitting: Family Medicine

## 2019-03-04 ENCOUNTER — Other Ambulatory Visit: Payer: Self-pay

## 2019-03-04 VITALS — BP 172/80 | HR 71 | Temp 98.0°F | Ht 62.5 in | Wt 168.1 lb

## 2019-03-04 DIAGNOSIS — S8991XA Unspecified injury of right lower leg, initial encounter: Secondary | ICD-10-CM | POA: Diagnosis not present

## 2019-03-04 DIAGNOSIS — S8992XA Unspecified injury of left lower leg, initial encounter: Secondary | ICD-10-CM

## 2019-03-04 DIAGNOSIS — S80212A Abrasion, left knee, initial encounter: Secondary | ICD-10-CM | POA: Diagnosis not present

## 2019-03-04 DIAGNOSIS — S50312A Abrasion of left elbow, initial encounter: Secondary | ICD-10-CM | POA: Diagnosis not present

## 2019-03-04 NOTE — Assessment & Plan Note (Signed)
Check xray of bilateral knees and L tib/fib to r/o fibular fracture or other trauma after fall. Supportive care reviewed - leg elevation, tylenol for pain, home dressing changes reviewed as well.  Red flags to seek further care reviewed.

## 2019-03-04 NOTE — Progress Notes (Signed)
This visit was conducted in person.  BP (!) 172/80 (BP Location: Right Arm, Patient Position: Sitting, Cuff Size: Normal)   Pulse 71   Temp 98 F (36.7 C) (Tympanic)   Ht 5' 2.5" (1.588 m)   Wt 168 lb 2 oz (76.3 kg)   SpO2 94%   BMI 30.26 kg/m   BP Readings from Last 3 Encounters:  03/04/19 (!) 172/80  12/19/18 127/62  07/12/18 130/78    CC: fall Subjective:    Patient ID: [name] [name], female    DOB: 08-Oct-1940, 78 y.o.   MRN: [id number]  HPI: Maila Dukes is a 78 y.o. female presenting on 03/04/2019 for Fall (Pt fell last night at home outside on concrete driveway. Injured left knee and elbow. )   DOI: 03/03/2019 While walking outside last night at 8pm slipped on bricks, lost balance, landed on left side - injured knee, elbow, lower back. Was unable to get up by herself, sat on floor for an hour until a jogger passed by to help her get up. She was able to walk.   This morning noticing persistent lateral knee pain.   BP elevated today - attributed to discomfort noted today.      Relevant past medical, surgical, family and social history reviewed and updated as indicated. Interim medical history since our last visit reviewed. Allergies and medications reviewed and updated. Outpatient Medications Prior to Visit  Medication Sig Dispense Refill  . aspirin 81 MG tablet Take 81 mg by mouth daily.      Marland Kitchen azelastine (ASTELIN) 0.1 % nasal spray U 2 SPRAYS IEN Q 12 H.  5  . celecoxib (CELEBREX) 200 MG capsule TAKE 1 CAPSULE(200 MG) BY MOUTH TWICE DAILY 180 capsule 0  . DIOVAN 40 MG tablet TAKE 3 TABLETS BY MOUTH EVERY DAY 270 tablet 3  . furosemide (LASIX) 20 MG tablet Take 0.5 tablets (10 mg total) by mouth daily. For 3 days and as needed 20 tablet 0  . hydrochlorothiazide (HYDRODIURIL) 25 MG tablet TAKE 1 TABLET(25 MG) BY MOUTH DAILY AS NEEDED 90 tablet 1  . loratadine (CLARITIN) 10 MG tablet Take 10 mg by mouth daily.    . NORVASC 10 MG tablet TAKE 1 TABLET(10 MG) BY MOUTH  DAILY 90 tablet 1  . pantoprazole (PROTONIX) 40 MG tablet TAKE 1 TABLET BY MOUTH TWICE DAILY 180 tablet 1  . Red Yeast Rice 600 MG CAPS Take 4 capsules by mouth daily.     Marland Kitchen VALIUM 2 MG tablet TAKE 1/2 TO 1 TABLET BY MOUTH EVERY 12 HOURS 60 tablet 0   No facility-administered medications prior to visit.      Per HPI unless specifically indicated in ROS section below Review of Systems Objective:    BP (!) 172/80 (BP Location: Right Arm, Patient Position: Sitting, Cuff Size: Normal)   Pulse 71   Temp 98 F (36.7 C) (Tympanic)   Ht 5' 2.5" (1.588 m)   Wt 168 lb 2 oz (76.3 kg)   SpO2 94%   BMI 30.26 kg/m   Wt Readings from Last 3 Encounters:  03/04/19 168 lb 2 oz (76.3 kg)  12/19/18 163 lb (73.9 kg)  07/12/18 167 lb 4 oz (75.9 kg)    Physical Exam Vitals signs and nursing note reviewed.  Constitutional:      Appearance: Normal appearance. She is not ill-appearing.  Musculoskeletal: Normal range of motion.        General: Tenderness and signs of injury present. No swelling.  Right lower leg: No edema.     Left lower leg: No edema.     Comments:  No midline lumbar spine or paraspinous mm tenderness Neg seated SLR FROM bilateral elbows without significant pain  FROM at bilateral knees without crepitus, no popliteal fullness R knee - swelling and point tenderness medial knee along pes anserine bursa L knee - swelling laterally, marked tenderness lateral knee joint and inferior along fibula  Skin:    General: Skin is warm and dry.     Findings: Abrasion present. No erythema or rash.          Comments:  Small abrasion L posterior elbow Larger abrasion anterior knee below patella  Neurological:     Mental Status: She is alert.       Wound care: Left elbow and knee abrasions cleaned with betadine then irrigated with normal saline. Knee wound dressed with non stick gauze and kerlex gauze. abx ointment not used due to endorsed neosporin allergy.  Assessment & Plan:    Problem List Items Addressed This Visit    Injury of right knee    Anticipate traumatic pes anserine bursitis.  rec leg elevation, rest, tylenol for pain. Update if not improving with treatment. She already is on celebrex daily.       Relevant Orders   DG Knee Complete 4 Views Right   Injury of left knee - Primary    Check xray of bilateral knees and L tib/fib to r/o fibular fracture or other trauma after fall. Supportive care reviewed - leg elevation, tylenol for pain, home dressing changes reviewed as well.  Red flags to seek further care reviewed.       Relevant Orders   DG Knee Complete 4 Views Left   DG Tibia/Fibula Left    Other Visit Diagnoses    Elbow abrasion, left, initial encounter       Abrasion of left knee, initial encounter           No orders of the defined types were placed in this encounter.  Orders Placed This Encounter  Procedures  . DG Knee Complete 4 Views Left    Standing Status:   Future    Number of Occurrences:   1    Standing Expiration Date:   05/03/2020    Order Specific Question:   Reason for Exam (SYMPTOM  OR DIAGNOSIS REQUIRED)    Answer:   fall with left knee injury    Order Specific Question:   Preferred imaging location?    Answer:   [facility]    Order Specific Question:   Radiology Contrast Protocol - do NOT remove file path    Answer:   \\[email or web address]s.[email or web address]  . DG Tibia/Fibula Left    Standing Status:   Future    Number of Occurrences:   1    Standing Expiration Date:   05/03/2020    Order Specific Question:   Reason for Exam (SYMPTOM  OR DIAGNOSIS REQUIRED)    Answer:   left fibular pain after fall last night    Order Specific Question:   Preferred imaging location?    Answer:   [facility]    Order Specific Question:   Radiology Contrast Protocol - do NOT remove file path    Answer:   \\[email or web address]s.[email or web address]  . DG Knee Complete 4 Views Right     Standing Status:   Future    Number of Occurrences:   1    Standing Expiration Date:  05/03/2020    Order Specific Question:   Reason for Exam (SYMPTOM  OR DIAGNOSIS REQUIRED)    Answer:   righ knee pain after fall yesterday    Order Specific Question:   Preferred imaging location?    Answer:   [facility]    Order Specific Question:   Radiology Contrast Protocol - do NOT remove file path    Answer:   \\[email or web address]    Patient Instructions  Xrays looking ok - we will call you if any change in plan based on radiologist evaluation.  Wounds dressed today. Continue dressing changes daily.  Return if fever, streaking redness, draining pus, worsening leg numbness or other concerns.    Follow up plan: Return if symptoms worsen or fail to improve.  Ria Bush, MD

## 2019-03-04 NOTE — Patient Instructions (Signed)
Xrays looking ok - we will call you if any change in plan based on radiologist evaluation.  Wounds dressed today. Continue dressing changes daily.  Return if fever, streaking redness, draining pus, worsening leg numbness or other concerns.

## 2019-03-04 NOTE — Assessment & Plan Note (Signed)
Anticipate traumatic pes anserine bursitis.  rec leg elevation, rest, tylenol for pain. Update if not improving with treatment. She already is on celebrex daily.

## 2019-03-20 ENCOUNTER — Other Ambulatory Visit (HOSPITAL_COMMUNITY): Payer: Self-pay | Admitting: Cardiovascular Disease

## 2019-03-20 ENCOUNTER — Telehealth: Payer: Self-pay | Admitting: Family Medicine

## 2019-03-20 DIAGNOSIS — I6523 Occlusion and stenosis of bilateral carotid arteries: Secondary | ICD-10-CM

## 2019-03-20 NOTE — Telephone Encounter (Signed)
Patient rescheduled labs and follow up for this month/   She will be in the office tomorrow morning for lab work.  Could you put the orders in for this?   Thanks!

## 2019-03-21 ENCOUNTER — Other Ambulatory Visit (INDEPENDENT_AMBULATORY_CARE_PROVIDER_SITE_OTHER): Payer: Medicare Other

## 2019-03-21 ENCOUNTER — Telehealth: Payer: Self-pay | Admitting: Family Medicine

## 2019-03-21 ENCOUNTER — Telehealth: Payer: Self-pay

## 2019-03-21 DIAGNOSIS — E7800 Pure hypercholesterolemia, unspecified: Secondary | ICD-10-CM

## 2019-03-21 LAB — COMPREHENSIVE METABOLIC PANEL
ALT: 26 U/L (ref 0–35)
AST: 21 U/L (ref 0–37)
Albumin: 4.7 g/dL (ref 3.5–5.2)
Alkaline Phosphatase: 105 U/L (ref 39–117)
BUN: 22 mg/dL (ref 6–23)
CO2: 27 mEq/L (ref 19–32)
Calcium: 9.6 mg/dL (ref 8.4–10.5)
Chloride: 95 mEq/L — ABNORMAL LOW (ref 96–112)
Creatinine, Ser: 0.87 mg/dL (ref 0.40–1.20)
GFR: 62.72 mL/min (ref >60.00)
Glucose, Bld: 96 mg/dL (ref 70–99)
Potassium: 4.1 mEq/L (ref 3.5–5.1)
Sodium: 130 mEq/L — ABNORMAL LOW (ref 135–145)
Total Bilirubin: 0.8 mg/dL (ref 0.2–1.2)
Total Protein: 7.1 g/dL (ref 6.0–8.3)

## 2019-03-21 LAB — LIPID PANEL
Cholesterol: 152 mg/dL (ref 0–200)
HDL: 70.7 mg/dL (ref >39.00)
LDL Cholesterol: 58 mg/dL (ref 0–99)
NonHDL: 81.58
Total CHOL/HDL Ratio: 2
Triglycerides: 119 mg/dL (ref 0.0–149.0)
VLDL: 23.8 mg/dL (ref 0.0–40.0)

## 2019-03-21 NOTE — Telephone Encounter (Signed)

## 2019-03-21 NOTE — Telephone Encounter (Signed)
-----   Message from Cloyd Stagers, RT sent at 03/20/2019 11:33 AM EDT ----- Regarding: Lab Orders for Friday 7.17.2020 Please place lab orders for Friday 7.17.2020, office visit for 61mo f/u on Friday 7.24.2020 Thank you, Dyke Maes RT(R)

## 2019-03-24 ENCOUNTER — Other Ambulatory Visit: Payer: Self-pay

## 2019-03-24 ENCOUNTER — Ambulatory Visit (INDEPENDENT_AMBULATORY_CARE_PROVIDER_SITE_OTHER): Payer: Medicare Other

## 2019-03-24 DIAGNOSIS — I6523 Occlusion and stenosis of bilateral carotid arteries: Secondary | ICD-10-CM

## 2019-03-25 ENCOUNTER — Ambulatory Visit (INDEPENDENT_AMBULATORY_CARE_PROVIDER_SITE_OTHER): Payer: Medicare Other | Admitting: Family Medicine

## 2019-03-25 ENCOUNTER — Telehealth: Payer: Self-pay | Admitting: Family Medicine

## 2019-03-25 ENCOUNTER — Encounter: Payer: Self-pay | Admitting: Family Medicine

## 2019-03-25 VITALS — BP 158/76 | HR 89 | Temp 98.2°F | Ht 62.5 in | Wt 163.1 lb

## 2019-03-25 DIAGNOSIS — I6523 Occlusion and stenosis of bilateral carotid arteries: Secondary | ICD-10-CM | POA: Diagnosis not present

## 2019-03-25 DIAGNOSIS — I10 Essential (primary) hypertension: Secondary | ICD-10-CM | POA: Diagnosis not present

## 2019-03-25 DIAGNOSIS — S8992XD Unspecified injury of left lower leg, subsequent encounter: Secondary | ICD-10-CM | POA: Diagnosis not present

## 2019-03-25 DIAGNOSIS — E871 Hypo-osmolality and hyponatremia: Secondary | ICD-10-CM

## 2019-03-25 DIAGNOSIS — E7800 Pure hypercholesterolemia, unspecified: Secondary | ICD-10-CM

## 2019-03-25 MED ORDER — CELECOXIB 200 MG PO CAPS: ORAL_CAPSULE | ORAL | 0 refills | Status: DC

## 2019-03-25 MED ORDER — NORVASC 10 MG PO TABS: 10.0000 mg | ORAL_TABLET | Freq: Every day | ORAL | 3 refills | Status: AC

## 2019-03-25 NOTE — Telephone Encounter (Signed)
Pt was wondering about Celebrex that was supposed to be left up front. I was unable to find anything up front for her. She is requesting a call to discuss this when Butch Penny gets back into the office.

## 2019-03-25 NOTE — Progress Notes (Deleted)
Cardiology Office Note Date:  03/25/2019  Patient ID:  [name] [name], [name] [name] May 04, 1941, MRN [id number] PCP:  Jinny Sanders, MD  Cardiologist:  Dr. Rockey Situ, MD  ***refresh   Chief Complaint: Follow up  History of Present Illness: [name] [name] is a 78 y.o. female with history of paroxysmal SVT/atrial tachycardia, diastolic dysfunction, mild pulmonary hypertension, mitral regurgitation, carotid artery disease, HTN, HLD with statin intolerance, dizziness/vertigo, chronic low back pain, diverticulitis, hiatal hernia, OSA, gait instability, chronic lower extremity swelling, and hypothyroidism who presents for follow up.  Most recent echo from 06/2017 showed an EF of 60-65%, normal wall motion, Gr1DD, mild MR, LA norma in size, RVSF normal, PASP 39 mmHg. When compared to echo in 2014 EF remained the same, mitral regurgitation was stable, and PASP was improved from 43 mmHg. She underwent Myoview in 09/2017 for SOB that showed no significant ischemia or scar, EF > 65%, and was low risk overall. Zio monitor in 10/2017 showed a predominant rhythm of sinus with an average heart rate of 77 bpm. There were 16 SVT runs occurring with the longest lasting 13.4 seconds with an average rate of 110 bpm. Some episodes of SVT may have been possible atrial tachycardia with variable block. Isolated PACs, atrial couplets, and triplets were rare. Isolated PVCs were rare. No medication changes were made at that time. She was last seen in the office in 11/2017 for follow up.   Most recent carotid artery ultrasound from 03/24/2019 showed 1-39% bilateral ICA stenosis, antegrade flow along the bilateral vertebral arteries, and normal flow hemodynamics in the bilateral subclavian arteries. This was unchanged from her study in 02/2018 and 04/2017.   ***  Labs: 03/2019 - TC 152, TG 119, HDL 70, LDL 58, K+ 4.1, SCr 0.87, AST/ALT normal, albumin 4.7   Past Medical History:  Diagnosis Date  . Arthritis   . Chronic lower back pain    . Diverticulitis   . GERD (gastroesophageal reflux disease)   . Hemorrhoids, internal, with bleeding   . Hiatal hernia   . History of colon polyps   . HLD (hyperlipidemia)   . Hypertension   . OSA (obstructive sleep apnea)   . PUD (peptic ulcer disease)   . Scoliosis   . Thyroid disease    hypo  . Thyroid nodule   . Vaginitis, atrophic   . Vertigo     Past Surgical History:  Procedure Laterality Date  . ANKLE FUSION  1967   LEFT  . CHOLECYSTECTOMY     2004  . COLONOSCOPY  06/22/2011   diverticulosis (no adenomas since 2001)  . FRACTURE SURGERY Left 1967   foot and ankle surgery    No outpatient medications have been marked as taking for the 03/31/19 encounter (Appointment) with Rise Mu, PA-C.    Allergies:   Ace inhibitors, Neosporin [neomycin-bacitracin zn-polymyx], Statins, Tramadol, and Beta adrenergic blockers   Social History:  The patient  reports that she has never smoked. She has never used smokeless tobacco. She reports current alcohol use. She reports that she does not use drugs.   Family History:  The patient's family history includes Cancer in her mother, sister, and sister; Colon cancer (age of onset: 68) in her mother; Deep vein thrombosis in her father; Diabetes in her brother and sister; Heart attack in her brother and father; Heart disease in her brother, brother, brother, brother, father, mother, and sister; Hyperlipidemia in her brother and sister; Hypertension in her brother, mother, and sister; Stomach cancer in  her sister.  ROS:   ROS   PHYSICAL EXAM: *** VS:  There were no vitals taken for this visit. BMI: There is no height or weight on file to calculate BMI.  Physical Exam   EKG:  Was ordered and interpreted by me today. Shows ***  Recent Labs: 03/21/2019: ALT 26; BUN 22; Creatinine, Ser 0.87; Potassium 4.1; Sodium 130  10/28/2018: Direct LDL 102.0 03/21/2019: Cholesterol 152; HDL 70.70; LDL Cholesterol 58; Total CHOL/HDL Ratio 2;  Triglycerides 119.0; VLDL 23.8   CrCl cannot be calculated (Unknown ideal weight.).   Wt Readings from Last 3 Encounters:  03/04/19 168 lb 2 oz (76.3 kg)  12/19/18 163 lb (73.9 kg)  07/12/18 167 lb 4 oz (75.9 kg)     Other studies reviewed: Additional studies/records reviewed today include: summarized above  ASSESSMENT AND PLAN:  1. ***  Disposition: F/u with Dr. Rockey Situ or an APP in ***.  Current medicines are reviewed at length with the patient today.  The patient did not have any concerns regarding medicines.  Signed, Christell Faith, PA-C 03/25/2019 9:57 AM     [facility] [address] [place], [place] [address] [phone]

## 2019-03-25 NOTE — Patient Instructions (Addendum)
Elevate left leg as much as able.  Wear a neoprene pull on brace if tolerated.   Use celebrex for pain  In ankle  Add salt to diet... add back electrolyte water.  Get back to exercise as able.

## 2019-03-25 NOTE — Progress Notes (Signed)
Chief Complaint  Patient presents with  . Follow-up    Here for 6 mo f/u.  Marland Kitchen Foot Pain    C/o left foot pain and heel bruising due to fall on 03/03/19.     History of Present Illness: HPI  78 year old female presents for 6 month follow up.  Hypertension:   She feels her high stress level from home life is causing her BP issue. She is now going through a divorce and plans to move to [place] to be with family.   BP Readings from Last 3 Encounters:  03/25/19 (!) 158/76  03/04/19 (!) 172/80  12/19/18 127/62  Using medication without problems or lightheadedness: none Chest pain with exertion: none Edema:yes Short of breath:none Average home BPs: Other issues:   Wt Readings from Last 3 Encounters:  03/25/19 163 lb 2 oz (74 kg)  03/04/19 168 lb 2 oz (76.3 kg)  12/19/18 163 lb (73.9 kg)     Cholesterol well controlled  With red yeast rice and eating at home.   Foot pain:, left: following fall on 03/03/2019 She had no proceeding symptoms. Fall was accidental when she slipped, hit great toe on left foot . No balance issues.and landed on knee. Knee pain has improved, but left heel bruised. No sore on outer side of foot, left ankle more swollen then right.  Celebrex needs refill.  HCTZ.. making low in sodium.. no longer haivng electrolyte water.  COVID 19 screen No recent travel or known exposure to COVID19 The patient denies respiratory symptoms of COVID 19 at this time.  The importance of social distancing was discussed today.   Review of Systems  Constitutional: Negative for chills and fever.  HENT: Negative for congestion and ear pain.   Eyes: Negative for pain and redness.  Respiratory: Negative for cough and shortness of breath.   Cardiovascular: Negative for chest pain, palpitations and leg swelling.  Gastrointestinal: Negative for abdominal pain, blood in stool, constipation, diarrhea, nausea and vomiting.  Genitourinary: Negative for dysuria.  Musculoskeletal:  Negative for falls and myalgias.  Skin: Negative for rash.  Neurological: Negative for dizziness.  Psychiatric/Behavioral: Negative for depression. The patient is not nervous/anxious.       Past Medical History:  Diagnosis Date  . Arthritis   . Chronic lower back pain   . Diverticulitis   . GERD (gastroesophageal reflux disease)   . Hemorrhoids, internal, with bleeding   . Hiatal hernia   . History of colon polyps   . HLD (hyperlipidemia)   . Hypertension   . OSA (obstructive sleep apnea)   . PUD (peptic ulcer disease)   . Scoliosis   . Thyroid disease    hypo  . Thyroid nodule   . Vaginitis, atrophic   . Vertigo     reports that she has never smoked. She has never used smokeless tobacco. She reports current alcohol use. She reports that she does not use drugs.   Current Outpatient Medications:  .  aspirin 81 MG tablet, Take 81 mg by mouth daily.  , Disp: , Rfl:  .  azelastine (ASTELIN) 0.1 % nasal spray, U 2 SPRAYS IEN Q 12 H., Disp: , Rfl: 5 .  celecoxib (CELEBREX) 200 MG capsule, TAKE 1 CAPSULE(200 MG) BY MOUTH TWICE DAILY, Disp: 180 capsule, Rfl: 0 .  DIOVAN 40 MG tablet, TAKE 3 TABLETS BY MOUTH EVERY DAY, Disp: 270 tablet, Rfl: 3 .  furosemide (LASIX) 20 MG tablet, Take 0.5 tablets (10 mg total) by mouth  daily. For 3 days and as needed, Disp: 20 tablet, Rfl: 0 .  hydrochlorothiazide (HYDRODIURIL) 25 MG tablet, TAKE 1 TABLET(25 MG) BY MOUTH DAILY AS NEEDED, Disp: 90 tablet, Rfl: 1 .  loratadine (CLARITIN) 10 MG tablet, Take 10 mg by mouth daily., Disp: , Rfl:  .  NORVASC 10 MG tablet, TAKE 1 TABLET(10 MG) BY MOUTH DAILY, Disp: 90 tablet, Rfl: 1 .  pantoprazole (PROTONIX) 40 MG tablet, TAKE 1 TABLET BY MOUTH TWICE DAILY, Disp: 180 tablet, Rfl: 1 .  Red Yeast Rice 600 MG CAPS, Take 4 capsules by mouth daily. , Disp: , Rfl:  .  VALIUM 2 MG tablet, TAKE 1/2 TO 1 TABLET BY MOUTH EVERY 12 HOURS, Disp: 60 tablet, Rfl: 0   Observations/Objective: Blood pressure (!) 158/76, pulse  89, temperature 98.2 F (36.8 C), temperature source Temporal, height 5' 2.5" (1.588 m), weight 163 lb 2 oz (74 kg), SpO2 96 %.  Physical Exam Constitutional:      General: She is not in acute distress.    Appearance: Normal appearance. She is well-developed. She is not ill-appearing or toxic-appearing.  HENT:     Head: Normocephalic.     Right Ear: Hearing, tympanic membrane, ear canal and external ear normal. Tympanic membrane is not erythematous, retracted or bulging.     Left Ear: Hearing, tympanic membrane, ear canal and external ear normal. Tympanic membrane is not erythematous, retracted or bulging.     Nose: No mucosal edema or rhinorrhea.     Right Sinus: No maxillary sinus tenderness or frontal sinus tenderness.     Left Sinus: No maxillary sinus tenderness or frontal sinus tenderness.     Mouth/Throat:     Pharynx: Uvula midline.  Eyes:     General: Lids are normal. Lids are everted, no foreign bodies appreciated.     Conjunctiva/sclera: Conjunctivae normal.     Pupils: Pupils are equal, round, and reactive to light.  Neck:     Musculoskeletal: Normal range of motion and neck supple.     Thyroid: No thyroid mass or thyromegaly.     Vascular: No carotid bruit.     Trachea: Trachea normal.  Cardiovascular:     Rate and Rhythm: Normal rate and regular rhythm.     Pulses: Normal pulses.     Heart sounds: Normal heart sounds, S1 normal and S2 normal. No murmur. No friction rub. No gallop.   Pulmonary:     Effort: Pulmonary effort is normal. No tachypnea or respiratory distress.     Breath sounds: Normal breath sounds. No decreased breath sounds, wheezing, rhonchi or rales.  Abdominal:     General: Bowel sounds are normal.     Palpations: Abdomen is soft.     Tenderness: There is no abdominal tenderness.  Skin:    General: Skin is warm and dry.     Findings: No rash.  Neurological:     Mental Status: She is alert.  Psychiatric:        Mood and Affect: Mood is not  anxious or depressed.        Speech: Speech normal.        Behavior: Behavior normal. Behavior is cooperative.        Thought Content: Thought content normal.        Judgment: Judgment normal.      Assessment and Plan      Eliezer Lofts, MD

## 2019-03-27 NOTE — Telephone Encounter (Signed)
Noted. I believe celebrex in Donna's cabinet is hers, but it is not labeled as far as I can see. Agree with awaiting Donna's return for verification.

## 2019-03-28 ENCOUNTER — Ambulatory Visit: Payer: Medicare Other | Admitting: Family Medicine

## 2019-03-31 ENCOUNTER — Ambulatory Visit: Payer: Medicare Other | Admitting: Physician Assistant

## 2019-03-31 NOTE — Telephone Encounter (Signed)
Celebrex 200 mg Lot # [id number] Exp: 05/04/2021, 2 bottles x 100 capsules left up front for [name] [name] to pick up.  Patient notified by telephone.

## 2019-04-01 ENCOUNTER — Other Ambulatory Visit: Payer: Self-pay | Admitting: Family Medicine

## 2019-04-01 NOTE — Telephone Encounter (Signed)
Last office visit 03/25/2019 for 6 month follow up & foot pain.  Last refilled 01/28/2019 for #60 with no refills.  No future appointments.

## 2019-04-02 ENCOUNTER — Telehealth: Payer: Self-pay | Admitting: Family Medicine

## 2019-04-02 DIAGNOSIS — R2689 Other abnormalities of gait and mobility: Secondary | ICD-10-CM

## 2019-04-02 NOTE — Telephone Encounter (Signed)
Best number [phone]  Pt called wanting to get an order for [facility] on [address] Pt had fall needs PT strengthen and balance

## 2019-04-03 NOTE — Telephone Encounter (Signed)
Okay to give verbal order as requested.

## 2019-04-03 NOTE — Telephone Encounter (Signed)
You will need to place referral for PT.

## 2019-04-04 ENCOUNTER — Ambulatory Visit: Payer: Medicare Other | Admitting: Physician Assistant

## 2019-04-04 NOTE — Telephone Encounter (Signed)
Done

## 2019-04-07 ENCOUNTER — Ambulatory Visit: Payer: Medicare Other | Admitting: Family Medicine

## 2019-04-07 NOTE — Telephone Encounter (Signed)
error 

## 2019-04-08 ENCOUNTER — Ambulatory Visit: Payer: Medicare Other | Attending: Family Medicine

## 2019-04-08 ENCOUNTER — Other Ambulatory Visit: Payer: Self-pay

## 2019-04-08 DIAGNOSIS — M6281 Muscle weakness (generalized): Secondary | ICD-10-CM | POA: Diagnosis not present

## 2019-04-08 DIAGNOSIS — Z9181 History of falling: Secondary | ICD-10-CM | POA: Diagnosis present

## 2019-04-09 NOTE — Therapy (Signed)
[facility] [address] [place], [address] Phone: [phone]   Fax:  [phone]  Physical Therapy Evaluation  Patient Details  Name: [name] [name] MRN: [id number] Date of Birth: 1941/02/11 Referring Provider (PT): Diona Browner MD   Encounter Date: 04/08/2019  PT End of Session - 04/09/19 1013    Visit Number  1    Number of Visits  13    Date for PT Re-Evaluation  05/20/19    Authorization Type  1 /10    PT Start Time  0930    PT Stop Time  1030    PT Time Calculation (min)  60 min    Activity Tolerance  Patient tolerated treatment well    Behavior During Therapy  [facility] for tasks assessed/performed       Past Medical History:  Diagnosis Date  . Arthritis   . Chronic lower back pain   . Diverticulitis   . GERD (gastroesophageal reflux disease)   . Hemorrhoids, internal, with bleeding   . Hiatal hernia   . History of colon polyps   . HLD (hyperlipidemia)   . Hypertension   . OSA (obstructive sleep apnea)   . PUD (peptic ulcer disease)   . Scoliosis   . Thyroid disease    hypo  . Thyroid nodule   . Vaginitis, atrophic   . Vertigo     Past Surgical History:  Procedure Laterality Date  . ANKLE FUSION  1967   LEFT  . CHOLECYSTECTOMY     2004  . COLONOSCOPY  06/22/2011   diverticulosis (no adenomas since 2001)  . FRACTURE SURGERY Left 1967   foot and ankle surgery    There were no vitals filed for this visit.   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 Patient reports she had increased pain along her L knee and along her L shin. Patient states she's been having difficulty with walking and standing for prolonged periods of time. Patient reports her confidence has been widely affected from her balance and her recent  falls and feels like she needs to reach out to touch object as hshe walks so she does not fall. Patient states decreased ability to perform prolonged standing, most notably with cleaning and performing food preparation.    Pertinent History  Recent Fall, Meinere's disease, L ankle replacement    Limitations  Lifting;Standing;Walking    How long can you stand comfortably?  1 min    Diagnostic tests  none    Patient Stated Goals  Improve balance and confidence    Currently in Pain?  No/denies         [facility] PT Assessment - 04/09/19 1122      Assessment   Medical Diagnosis  Balance difficulties    Referring Provider (PT)  Diona Browner MD    Onset Date/Surgical Date  03/19/19    Hand Dominance  Right    Next MD Visit  unknown    Prior Therapy  Yes - LBP      Balance Screen   Has the patient fallen in the past 6 months  Yes    How many times?  1    Has the patient had a decrease in activity level because of a fear of falling?   Yes    Is the patient reluctant  to leave their home because of a fear of falling?   No      Home Film/video editor residence    Living Arrangements  Spouse/significant other    Available Help at Discharge  Family    Type of [place] to enter    Entrance Stairs-Number of Steps  15    Entrance Stairs-Rails  Can reach both    [place]  One level    [place] - single point;Shower seat;Grab bars - toilet;Grab bars - tub/shower      Prior Function   Level of Independence  Independent    Vocation  Retired    [employer]  N/a    Leisure  Walking, dancing      Cognition   Overall Cognitive Status  Within Functional Limits for tasks assessed      Sensation   Additional Comments  WNL for movement assessed      Functional Tests   Functional tests  Sit to Stand      Sit to Stand   Comments  Decreased forward trunk flexion utilizing momentum       Posture/Postural Control   Posture  Comments  Decreased forward flexion in standing      ROM / Strength   AROM / PROM / Strength  AROM;Strength      AROM   AROM Assessment Site  Hip;Knee;Ankle    Right/Left Hip  Right;Left    Right Hip Extension  0    Right Hip Flexion  105    Right Hip External Rotation   15    Right Hip Internal Rotation   15    Right Hip ABduction  30    Right Hip ADduction  30    Left Hip Extension  0    Left Hip Flexion  105    Left Hip External Rotation   15    Left Hip Internal Rotation   15    Left Hip ABduction  30    Left Hip ADduction  30    Right/Left Knee  Right;Left    Right Knee Extension  0    Right Knee Flexion  90    Left Knee Extension  0    Left Knee Flexion  90    Right/Left Ankle  Left;Right    Right Ankle Dorsiflexion  -15    Left Ankle Dorsiflexion  -20      Strength   Strength Assessment Site  Hip;Knee;Ankle    Right/Left Hip  Right;Left    Right Hip Flexion  4/5    Right Hip External Rotation   3/5    Right Hip Internal Rotation  3/5    Right Hip ABduction  3/5    Right Hip ADduction  4/5    Left Hip Flexion  4/5    Left Hip External Rotation  3/5    Left Hip Internal Rotation  3/5    Left Hip ABduction  3/5    Left Hip ADduction  3/5    Right/Left Knee  Right;Left    Right Knee Flexion  3+/5    Right Knee Extension  4-/5    Left Knee Flexion  3+/5    Left Knee Extension  4-/5    Right/Left Ankle  Right;Left      Transfers   Five time sit to stand comments   18sec      Ambulation/Gait  Gait Comments  Decreased stride length, no push off B, decreased arm swing, decreased foot clearance B, B hip ER in sitting       Standardized Balance Assessment   Standardized Balance Assessment  Timed Up and Go Test;Dynamic Gait Index;Five Times Sit to Stand;10 meter walk test    10 Meter Walk  .74m/s      Dynamic Gait Index   Level Surface  Normal    Change in Gait Speed  Mild Impairment    Gait with Horizontal Head Turns  Moderate Impairment    Gait with  Vertical Head Turns  Severe Impairment    Gait and Pivot Turn  Moderate Impairment    Step Over Obstacle  Severe Impairment    Step Around Obstacles  Mild Impairment    Steps  Moderate Impairment    Total Score  10      Timed Up and Go Test   Normal TUG (seconds)  22      Static Balancing: Unable to achieve true tandem stance; only able to perform widened tandem secondary to balance difficulties -- x10 sec  Objective measurements completed on examination: See above findings.   TREATMENT Therapeutic Exercise Sit to stands -- 2 x 5 Ambulation with focus on taking larger steps -- 3 x 55ft  Performed exercises to improve balance most notably in dynamic versus static balance           PT Education - 04/09/19 1012    Education Details  HEP: sit to standing, walking along counter; POC    Person(s) Educated  Patient    Methods  Explanation;Demonstration    Comprehension  Verbalized understanding;Returned demonstration          PT Long Term Goals - 04/09/19 1035      PT LONG TERM GOAL #1   Title  Patient will be independent with exercise performance and progression to continue benefits of therapy after discharge    Baseline  form/technique with exercise    Time  6    Period  Weeks    Status  New      PT LONG TERM GOAL #2   Title  Patient will improve 73mwt to over 15m/s to indicate a decrease in fall risk with performing community ambulation    Baseline  .109m/s    Time  6    Period  Weeks    Status  New      PT LONG TERM GOAL #3   Title  Patient will improve her DGI by 4 points to indicate significant improvement with dynamic balance and lower her fall risk.    Time  6    Period  Weeks    Status  New    Target Date  05/20/19      PT LONG TERM GOAL #4   Title  Patient will improve her TUG to under 10 sec to indicate significant improvement with fall risk.    Baseline  22sec    Time  6    Period  Weeks    Status  New      PT LONG TERM GOAL #5   Title   Patient will improve her 5xSTS to under 12 sec from a 18" chair to indicate singificant improvement with fall risk and functional LE strength.    Baseline  18sec from 20" chair    Time  6    Period  Weeks    Status  New  Plan - 04/09/19 1015    Clinical Impression Statement  Patient is a 78 yo right hand dominant female presenting with increased balance difficulties secondary to an exacerbation of her Meinere's dz and a recent fall 3 weeks prior. Patient's balance dysfunction is indicated by increased difficulties with performing 5xSTS, TUG, DGI, and 73mwt. Patient also demonstrates generalized weakness along her LE and hip and will benefit from further skilled therapy to return to prior level of function.    Personal Factors and Comorbidities  Comorbidity 3+    Comorbidities  Meinere's dz, L ankle replacement, recent fall    Examination-Activity Limitations  Lift;Bathing;Squat;Stairs;Stand    Examination-Participation Restrictions  Church;Volunteer;Community Activity    Stability/Clinical Decision Making  Evolving/Moderate complexity    Clinical Decision Making  Moderate    Rehab Potential  Good    PT Frequency  2x / week    PT Duration  6 weeks    PT Treatment/Interventions  Aquatic Therapy;Cryotherapy;Moist Heat;Stair training;Functional mobility training;Gait training;Therapeutic exercise;Therapeutic activities;Neuromuscular re-education;Balance training;Patient/family education;Manual techniques;Dry needling;Passive range of motion    PT Home Exercise Plan  See education section    Consulted and Agree with Plan of Care  Patient       Patient will benefit from skilled therapeutic intervention in order to improve the following deficits and impairments:  Decreased activity tolerance, Decreased balance, Abnormal gait, Decreased endurance, Decreased mobility, Difficulty walking, Hypomobility, Increased muscle spasms, Impaired sensation, Decreased range of motion, Decreased  coordination, Decreased strength, Postural dysfunction  Visit Diagnosis: 1. Muscle weakness (generalized)   2. History of falling        Problem List Patient Active Problem List   Diagnosis Date Noted  . Injury of left knee 03/04/2019  . Injury of right knee 03/04/2019  . Lower abdominal pain 12/19/2018  . Hyponatremia 01/08/2018  . Meniere's disease 01/08/2018  . 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 Lower extremity edema 08/09/2017  . Gait instability 08/09/2017  . Choking sensation 06/21/2017  . Inclusion cyst of vulva 05/14/2017  . Spasm of thoracic back muscle 05/11/2017  . Vulvar lesion 03/05/2017  . Situational anxiety 02/09/2017  . Puncture wound 01/05/2017  . Chronic insomnia 01/02/2017  . Counseling regarding end of life decision making 12/18/2014  . Raynaud phenomenon 08/31/2014  . Chronic allergic conjunctivitis 03/15/2014  . PAD (peripheral artery disease) ([place]) 03/05/2013  . Chronic back pain greater than 3 months duration 08/13/2012  . S/P ankle fusion 02/06/2011  . TEMPOROMANDIBULAR JOINT PAIN 05/20/2010  . Carotid stenosis 06/30/2009  . Multiple thyroid nodules 03/15/2009  . Obstructive sleep apnea 03/05/2009  . HEMORRHOIDS, INTERNAL, WITH BLEEDING 05/09/2007  . VERTIGO 12/18/2006  . COLONIC POLYPS 12/17/2006  . HYPERCHOLESTEROLEMIA 12/17/2006  . Essential hypertension 12/17/2006  . DIVERTICULAR DISEASE 12/17/2006  . VAGINITIS, ATROPHIC 12/17/2006  . Osteoarthritis, generalized 12/17/2006  . SCOLIOSIS 12/17/2006  . Seasonal and perennial allergic rhinitis 12/17/2006    Blythe Stanford, PT DPT 04/09/2019, 11:52 AM  [facility] [address], [place], [address] Phone: [phone]   Fax:  [phone]  Name: [name] [name] MRN: [id number] Date of Birth: [birth year]

## 2019-04-13 ENCOUNTER — Other Ambulatory Visit: Payer: Self-pay | Admitting: Family Medicine

## 2019-04-15 ENCOUNTER — Ambulatory Visit: Payer: Medicare Other

## 2019-04-16 ENCOUNTER — Ambulatory Visit: Payer: Medicare Other

## 2019-04-17 ENCOUNTER — Ambulatory Visit (INDEPENDENT_AMBULATORY_CARE_PROVIDER_SITE_OTHER): Payer: Medicare Other | Admitting: Podiatry

## 2019-04-17 ENCOUNTER — Other Ambulatory Visit: Payer: Self-pay

## 2019-04-17 ENCOUNTER — Encounter: Payer: Self-pay | Admitting: Podiatry

## 2019-04-17 DIAGNOSIS — L608 Other nail disorders: Secondary | ICD-10-CM | POA: Diagnosis not present

## 2019-04-17 DIAGNOSIS — M79676 Pain in unspecified toe(s): Secondary | ICD-10-CM | POA: Diagnosis not present

## 2019-04-17 DIAGNOSIS — B351 Tinea unguium: Secondary | ICD-10-CM

## 2019-04-17 NOTE — Progress Notes (Signed)
Complaint:  Visit Type: Patient returns to my office for continued preventative foot care services. Complaint: Patient states" my nails have grown long and thick and become painful to walk and wear shoes" Patient has been foot trauma years ago.. The patient presents for preventative foot care services. No changes to ROS  Podiatric Exam: Vascular: dorsalis pedis and posterior tibial pulses are palpable bilateral. Capillary return is immediate. Temperature gradient is WNL. Skin turgor WNL  Sensorium: Normal Semmes Weinstein monofilament test. Normal tactile sensation bilaterally. Nail Exam: Pt has thick disfigured discolored nails with subungual debris noted bilateral entire nail hallux through fifth toenails.  Pincer hallux nails  B/L. Ulcer Exam: There is no evidence of ulcer or pre-ulcerative changes or infection. Orthopedic Exam: Muscle tone and strength are WNL. No limitations in general ROM. No crepitus or effusions noted. Foot type and digits show no abnormalities. Limited ROM left foot. Skin: No Porokeratosis. No infection or ulcers Asymptomatic callus sub 2 left foot.  Diagnosis:  Onychomycosis, , Pain in right toe, pain in left toes  Treatment & Plan Procedures and Treatment: Consent by patient was obtained for treatment procedures.   Debridement of mycotic and hypertrophic toenails, 1 through 5 bilateral and clearing of subungual debris. No ulceration, no infection noted.  Return Visit-Office Procedure: Patient instructed to return to the office for a follow up visit 3 months for continued evaluation and treatment.    Gardiner Barefoot DPM

## 2019-04-22 ENCOUNTER — Ambulatory Visit: Payer: Medicare Other

## 2019-04-23 ENCOUNTER — Ambulatory Visit: Payer: Medicare Other | Admitting: Podiatry

## 2019-04-23 ENCOUNTER — Encounter

## 2019-04-24 ENCOUNTER — Ambulatory Visit: Payer: Medicare Other

## 2019-04-28 ENCOUNTER — Ambulatory Visit: Payer: Medicare Other

## 2019-04-30 ENCOUNTER — Ambulatory Visit: Payer: Medicare Other

## 2019-05-01 ENCOUNTER — Other Ambulatory Visit: Payer: Self-pay | Admitting: Family Medicine

## 2019-05-01 NOTE — Telephone Encounter (Signed)
Last office visit 03/25/2019 for 6 month follow up.  Last refilled 04/01/2019 for #60 with no refills. No future appointments.

## 2019-05-03 ENCOUNTER — Encounter: Payer: Self-pay | Admitting: Family Medicine

## 2019-05-03 NOTE — Assessment & Plan Note (Signed)
Due to HCTZ likely.. increase electrolytes in diet. Pt states she has been able to control this this way in past. Refuses to stop HCTZ.

## 2019-05-03 NOTE — Assessment & Plan Note (Signed)
Inadequate control in office. Per pt improved control at home. Follow at home .. gaol BP < 140/90. Work on stress reduction and relaxation techniques.

## 2019-05-03 NOTE — Assessment & Plan Note (Signed)
Well controlled. Continue current medication. Encouraged exercise, weight loss, healthy eating habits.  

## 2019-05-03 NOTE — Assessment & Plan Note (Signed)
Elevate left leg as much as able.  Wear a neoprene pull on brace if tolerated.   Use celebrex for pain  In ankle

## 2019-05-13 ENCOUNTER — Ambulatory Visit: Attending: Family Medicine | Primary: Family Medicine

## 2019-05-13 ENCOUNTER — Ambulatory Visit: Admit: 2019-05-13 | Discharge: 2019-05-13 | Payer: MEDICARE | Attending: Family Medicine | Primary: Family Medicine

## 2019-05-13 DIAGNOSIS — Z981 Arthrodesis status: Secondary | ICD-10-CM

## 2019-05-13 NOTE — Progress Notes (Signed)
 Health Maintenance Due   Topic Date Due   . DTaP/Tdap/Td series (1 - Tdap) 08/22/1960   . Shingrix Vaccine Age 80> (1 of 2) 08/22/1989   . GLAUCOMA SCREENING Q2Y  08/22/2004   . Bone Densitometry (Dexa) Screening  08/22/2004   . Pneumococcal 65+ years (1 of 1 - PPSV23) 08/22/2004   . Flu Vaccine (1) 05/06/2019     Do you have an Advance Care Plan in place in the event that you have a healthcare crisis that could impact your decision making as it pertains to your health?YES    Would you like information about Advance Care Planning? NO    Information given. NO

## 2019-05-13 NOTE — Progress Notes (Signed)
Chief Complaint   Patient presents with   ??? Establish Care     PREVIOUS DR. AMY [place]   ??? Labs     Pt is seen today as a new patient.     Pt reports that she recently came to live with her daughter in this area, pt needed to urgently leave her home in [place] due to progressively escalating situation with her husband's unstable mental health.     Pt uses a CPAP nightly.     Pt takes valium nightly, to help with sleep and muscle spasm.     Pt has a fall several months ago, skinned her left knee, pt has been having ongoing pain in her left leg. 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     Pt has done PT in the past with benefit, is concerned about going now due to [place].     Subjective: (As above and below)     Chief Complaint   Patient presents with   ??? Establish Care     PREVIOUS DR. AMY [place]   ??? Labs     she is a 78 y.o. year old female who presents for evaluation.    Reviewed PmHx, RxHx, FmHx, SocHx, AllgHx and updated in chart.    Review of Systems - negative except as listed above    Objective:     Vitals:    05/13/19 1359   BP: 160/82   Pulse: 96   Resp: 18   Temp: 97 ??F (36.1 ??C)   TempSrc: Temporal   SpO2: 98%   Weight: 155 lb 9.6 oz (70.6 kg)   Height: 5' 2.5" (1.588 m)     Physical Examination: General appearance - alert, well appearing, and in no distress  Mental status - normal mood, behavior, speech, dress, motor activity, and thought processes  Chest - clear to auscultation, no wheezes, rales or rhonchi, symmetric air entry  Heart - normal rate, regular rhythm, normal S1, S2, no murmurs, rubs, clicks or gallops  Musculoskeletal - chronic changes to the left ankle to due to history of fusion  Extremities - no edema, full ROM other than left foot  Skin - healing abrasion left anterior knee    Assessment/ Plan:   1. S/P ankle fusion  -well healed, limited ROM chronically,  continue home exercises    2. Injury of left knee, initial encounter  Advised patient to rest ice, consider physical therapy if symptoms do not improve    3. Hyponatremia  Stable per patient report, recent labs reviewed    4. Gait instability  Encourage patient to continue to use walker    5. Chronic insomnia  Stable on current medication, Valium    6. Meniere's disease, unspecified laterality  Chronic, symptoms currently stable       I have discussed the diagnosis with the patient and the intended plan as seen in the above orders.  The patient has received an after-visit summary and questions were answered concerning future plans.     Medication Side Effects and Warnings were discussed with patient: yes  Patient Labs were reviewed: yes  Patient Past Records were reviewed:  yes    Lindsay Berry, M.D.

## 2019-05-13 NOTE — Progress Notes (Signed)
Chief Complaint   Patient presents with   ??? Establish Care     PREVIOUS DR. AMY [place]   ??? Labs     Pt is seen today as a new patient.     Pt reports that she recently came to live with her daughter in this area, pt needed to urgently leave her home in [place] due to progressively escalating situation with her husband's unstable mental health.     Pt uses a CPAP nightly.     Pt takes valium nightly, to help with sleep and muscle spasm.     Pt has a fall several months ago, skinned her left knee, pt has been having ongoing pain in her left leg. 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     Pt has done PT in the past with benefit, is concerned about going now due to [place].     Subjective: (As above and below)     Chief Complaint   Patient presents with   ??? Establish Care     PREVIOUS DR. AMY [place]   ??? Labs     she is a 78 y.o. year old female who presents for evaluation.    Reviewed PmHx, RxHx, FmHx, SocHx, AllgHx and updated in chart.    Review of Systems - negative except as listed above    Objective:     Vitals:    05/13/19 1359   BP: 160/82   Pulse: 96   Resp: 18   Temp: 97 ??F (36.1 ??C)   TempSrc: Temporal   SpO2: 98%   Weight: 155 lb 9.6 oz (70.6 kg)   Height: 5' 2.5" (1.588 m)     Physical Examination: General appearance - alert, well appearing, and in no distress  Mental status - normal mood, behavior, speech, dress, motor activity, and thought processes  Chest - clear to auscultation, no wheezes, rales or rhonchi, symmetric air entry  Heart - normal rate, regular rhythm, normal S1, S2, no murmurs, rubs, clicks or gallops  Musculoskeletal - chronic changes to the left ankle to due to history of fusion  Extremities - no edema, full ROM other than left foot  Skin - healing abrasion left anterior knee    Assessment/ Plan:   1. S/P ankle fusion   -well healed, limited ROM chronically, continue home exercises    2. Injury of left knee, initial encounter  Advised patient to rest ice, consider physical therapy if symptoms do not improve    3. Hyponatremia  Stable per patient report, recent labs reviewed    4. Gait instability  Encourage patient to continue to use walker    5. Chronic insomnia  Stable on current medication, Valium    6. Meniere's disease, unspecified laterality  Chronic, symptoms currently stable       I have discussed the diagnosis with the patient and the intended plan as seen in the above orders.  The patient has received an after-visit summary and questions were answered concerning future plans.     Medication Side Effects and Warnings were discussed with patient: yes  Patient Labs were reviewed: yes  Patient Past Records were reviewed:  yes    [name], M.D.

## 2019-05-13 NOTE — Progress Notes (Signed)
Health Maintenance Due   Topic Date Due   ??? DTaP/Tdap/Td series (1 - Tdap) 08/22/1960   ??? Shingrix Vaccine Age 80> (1 of 2) 08/22/1989   ??? GLAUCOMA SCREENING Q2Y  08/22/2004   ??? Bone Densitometry (Dexa) Screening  08/22/2004   ??? Pneumococcal 65+ years (1 of 1 - PPSV23) 08/22/2004   ??? Flu Vaccine (1) 05/06/2019     Do you have an Advance Care Plan in place in the event that you have a healthcare crisis that could impact your decision making as it pertains to your health?YES    Would you like information about Advance Care Planning? NO    Information given. NO

## 2019-05-15 ENCOUNTER — Encounter: Attending: Adult Reconstructive Orthopaedic Surgery | Primary: Family Medicine

## 2019-06-06 ENCOUNTER — Other Ambulatory Visit: Payer: Medicare Other

## 2019-06-09 ENCOUNTER — Encounter

## 2019-06-09 MED ORDER — DIAZEPAM 2 MG TAB
2 mg | ORAL_TABLET | ORAL | 0 refills | Status: DC | PRN
Start: 2019-06-09 — End: 2019-06-16

## 2019-06-09 NOTE — Telephone Encounter (Signed)
It appears that it is time to refill medication, refilled x 30 days. Please inform.

## 2019-06-09 NOTE — Telephone Encounter (Signed)
Patient is not sure if it is time yet to call this med in, she was really confused. Please call patient at [phone]

## 2019-06-10 ENCOUNTER — Ambulatory Visit: Payer: Medicare Other | Admitting: Family Medicine

## 2019-06-10 NOTE — Telephone Encounter (Signed)
Left message for pt to return my call.     Pt's med sent in

## 2019-06-11 ENCOUNTER — Telehealth: Payer: Self-pay | Admitting: *Deleted

## 2019-06-11 MED ORDER — HYDROCHLOROTHIAZIDE 25 MG TAB
25 mg | ORAL_TABLET | Freq: Every day | ORAL | 3 refills | Status: DC
Start: 2019-06-11 — End: 2019-11-10

## 2019-06-11 MED ORDER — CELECOXIB 200 MG PO CAPS: ORAL_CAPSULE | ORAL | 0 refills | Status: DC

## 2019-06-11 NOTE — Telephone Encounter (Signed)
Please see if you can get Mrs. Schrack scheduled for a Medicare Wellness with Nurse and CPE with Dr. Diona Browner.

## 2019-06-13 NOTE — Telephone Encounter (Signed)
Patient calling to speak to nurse about valium being called in wrong. She can be reached at [phone]

## 2019-06-16 ENCOUNTER — Encounter

## 2019-06-16 MED ORDER — DIAZEPAM 2 MG TAB
2 mg | ORAL_TABLET | Freq: Two times a day (BID) | ORAL | 0 refills | Status: DC | PRN
Start: 2019-06-16 — End: 2019-06-20

## 2019-06-16 NOTE — Telephone Encounter (Signed)
Medication called in correctly today by Dr Percell Belt Risser  Haskell Flirt, LPN  80/30/5654  4:51 PM

## 2019-06-17 ENCOUNTER — Institutional Professional Consult (permissible substitution): Primary: Family Medicine

## 2019-06-17 ENCOUNTER — Encounter

## 2019-06-17 ENCOUNTER — Institutional Professional Consult (permissible substitution): Admit: 2019-06-17 | Discharge: 2019-06-18 | Payer: MEDICARE | Primary: Family Medicine

## 2019-06-17 DIAGNOSIS — Z23 Encounter for immunization: Secondary | ICD-10-CM

## 2019-06-17 NOTE — Telephone Encounter (Signed)
Left message asking pt to call office  °

## 2019-06-17 NOTE — Telephone Encounter (Signed)
Patient returned [name] [name]'s call.  Patient's living with her daughter in [place], so she didn't schedule appointments. She said Dr.Bedsole knows she's living in [place].

## 2019-06-17 NOTE — Telephone Encounter (Signed)
Do we need to remove Dr. Diona Browner as PCP??

## 2019-06-17 NOTE — Telephone Encounter (Signed)
Yes remove as PCP.. further refills should go to her new MD.

## 2019-06-17 NOTE — Telephone Encounter (Signed)
Dr. Bedsole removed as PCP. 

## 2019-06-17 NOTE — Telephone Encounter (Signed)
[facility] is stating pt is requesting 60 tabs over 30    Requested Prescriptions     Pending Prescriptions Disp Refills   ??? diazePAM (Valium) 2 mg tablet 30 Tab 0     Sig: Take 0.5-1 Tabs by mouth every twelve (12) hours as needed for Anxiety. Max Daily Amount: 4 mg.

## 2019-06-17 NOTE — Patient Instructions (Signed)
Vaccine Information Statement    Influenza (Flu) Vaccine (Inactivated or Recombinant): What You Need to Know    Many Vaccine Information Statements are available in Spanish and other languages. See www.immunize.org/vis  Hojas de informaci??n sobre vacunas est??n disponibles en espa??ol y en muchos otros idiomas. Visite www.immunize.org/vis    1. Why get vaccinated?    Influenza vaccine can prevent influenza (flu).    Flu is a contagious disease that spreads around the [place] every year, usually between October and May. Anyone can get the flu, but it is more dangerous for some people. Infants and young children, people 65 years of age and older, pregnant women, and people with certain health conditions or a weakened immune system are at greatest risk of flu complications.    Pneumonia, bronchitis, sinus infections and ear infections are examples of flu-related complications. If you have a medical condition, such as heart disease, cancer or diabetes, flu can make it worse.    Flu can cause fever and chills, sore throat, muscle aches, fatigue, cough, headache, and runny or stuffy nose. Some people may have vomiting and diarrhea, though this is more common in children than adults.     Each year thousands of people in the [place] die from flu, and many more are hospitalized. Flu vaccine prevents millions of illnesses and flu-related visits to the doctor each year.    2. Influenza vaccines     CDC recommends everyone 6 months of age and older get vaccinated every flu season. Children 6 months through 8 years of age may need 2 doses during a single flu season.  Everyone else needs only 1 dose each flu season.    It takes about 2 weeks for protection to develop after vaccination.    There are many flu viruses, and they are always changing. Each year a new flu vaccine is made to protect against three or four viruses that are likely to cause disease in the upcoming flu season. Even when the vaccine  doesn???t exactly match these viruses, it may still provide some protection.     Influenza vaccine does not cause flu.    Influenza vaccine may be given at the same time as other vaccines.    3. Talk with your health care provider    Tell your vaccine provider if the person getting the vaccine:  ??? Has had an allergic reaction after a previous dose of influenza vaccine, or has any severe, life-threatening allergies.   ??? Has ever had Guillain-Barr?? Syndrome (also called GBS).    In some cases, your health care provider may decide to postpone influenza vaccination to a future visit.    People with minor illnesses, such as a cold, may be vaccinated. People who are moderately or severely ill should usually wait until they recover before getting influenza vaccine.    Your health care provider can give you more information.    4. Risks of a reaction    ??? Soreness, redness, and swelling where shot is given, fever, muscle aches, and headache can happen after influenza vaccine.  ??? There may be a very small increased risk of Guillain-Barr?? Syndrome (GBS) after inactivated influenza vaccine (the flu shot).    Young children who get the flu shot along with pneumococcal vaccine (PCV13), and/or DTaP vaccine at the same time might be slightly more likely to have a seizure caused by fever. Tell your health care provider if a child who is getting flu vaccine has ever had a   seizure.    People sometimes faint after medical procedures, including vaccination. Tell your provider if you feel dizzy or have vision changes or ringing in the ears.    As with any medicine, there is a very remote chance of a vaccine causing a severe allergic reaction, other serious injury, or death.    5. What if there is a serious problem?    An allergic reaction could occur after the vaccinated person leaves the clinic. If you see signs of a severe allergic reaction (hives, swelling of the face and throat, difficulty breathing, a fast heartbeat, dizziness, or  weakness), call 9-1-1 and get the person to the nearest hospital.    For other signs that concern you, call your health care provider.    Adverse reactions should be reported to the Vaccine Adverse Event Reporting System (VAERS). Your health care provider will usually file this report, or you can do it yourself. Visit the VAERS website at www.vaers.hhs.gov or call 1-800-822-7967.  VAERS is only for reporting reactions, and VAERS staff do not give medical advice.    6. The National Vaccine Injury Compensation Program    The National Vaccine Injury Compensation Program (VICP) is a federal program that was created to compensate people who may have been injured by certain vaccines. Visit the VICP website at www.hrsa.gov/vaccinecompensation or call 1-800-338-2382 to learn about the program and about filing a claim. There is a time limit to file a claim for compensation.    7. How can I learn more?    ??? Ask your health care provider.   ??? Call your local or state health department.  ??? Contact the Centers for Disease Control and Prevention (CDC):  - Call 1-800-232-4636 (1-800-CDC-INFO) or  - Visit CDC???s influenza website at www.cdc.gov/flu    Vaccine Information Statement (Interim)  Inactivated Influenza Vaccine   04/18/2018  42 U.S.C. ?? 300aa-26   Department of Health and Human Services  Centers for Disease Control and Prevention    Office Use Only

## 2019-06-18 NOTE — Telephone Encounter (Signed)
This is an as needed medication, 30 pills per month. If pt needs to change this then an appt is needed.

## 2019-06-20 ENCOUNTER — Telehealth: Attending: Family Medicine | Primary: Family Medicine

## 2019-06-20 ENCOUNTER — Telehealth: Admit: 2019-06-20 | Discharge: 2019-06-20 | Payer: MEDICARE | Attending: Family Medicine | Primary: Family Medicine

## 2019-06-20 DIAGNOSIS — F5104 Psychophysiologic insomnia: Secondary | ICD-10-CM

## 2019-06-20 MED ORDER — DIAZEPAM 2 MG TAB
2 mg | ORAL_TABLET | Freq: Two times a day (BID) | ORAL | 2 refills | Status: DC | PRN
Start: 2019-06-20 — End: 2020-01-22

## 2019-06-20 NOTE — Progress Notes (Signed)
 1. Have you been to the ER, urgent care clinic since your last visit?  Hospitalized since your last visit?No    2. Have you seen or consulted any other health care providers outside of the [facility] since your last visit?  Include any pap smears or colon screening. No     Health Maintenance Due   Topic Date Due   . DTaP/Tdap/Td series (1 - Tdap) 08/22/1960   . Shingrix Vaccine Age 80> (1 of 2) 08/22/1989   . GLAUCOMA SCREENING Q2Y  08/22/2004   . Bone Densitometry (Dexa) Screening  08/22/2004   . Medicare Yearly Exam  05/13/2019

## 2019-06-20 NOTE — Progress Notes (Signed)
Chief Complaint   Patient presents with   ??? Medication Evaluation     Pt was evaluated via phone call only.     Patient is requesting a refill on her Valium prescription from 30 pills a month to 60 pills a month.  Patient reports that she takes an entire pill every night to help her sleep and then will take an additional half to 1 pill during the day as needed.  Patient reports the primary reason she is taking the medication is due to muscle spasms and old injuries that cause pain and prevent her from sleeping.    Patient reports that she does not respond well to pain medication, has had side effects with muscle relaxers in the past and that Valium works the best for her.    Patient reports that she is aware of the risks of Valium both as a long-term medication and given her age, however would like to continue at this time.     Lindsay Berry is a 78 y.o. female, evaluated via audio-only technology on 06/20/2019 for Medication Evaluation  .    Assessment & Plan:   Diagnoses and all orders for this visit:    1. Chronic insomnia  -     diazePAM (Valium) 2 mg tablet; Take 0.5-1 Tabs by mouth every twelve (12) hours as needed for Anxiety. Max Daily Amount: 4 mg. MUST LAST 30 DAYS    2. Other chronic pain  -     diazePAM (Valium) 2 mg tablet; Take 0.5-1 Tabs by mouth every twelve (12) hours as needed for Anxiety. Max Daily Amount: 4 mg. MUST LAST 30 DAYS    Spoke with patient at length regarding the concerns with this medication.  Advised that I will refill for 45 pills/month, with the hope of gradually decreasing.  Strongly encourage patient to consider other options to help with sleep.  Recommended trying an alternate muscle relaxer, possibly including baclofen.  Patient agreed and was willing to try reducing the medication.  Patient aware of current risks    12  Subjective:       Prior to Admission medications    Medication Sig Start Date End Date Taking? Authorizing Provider   diazePAM (Valium) 2 mg tablet Take 0.5-1  Tabs by mouth every twelve (12) hours as needed for Anxiety. Max Daily Amount: 4 mg. 06/16/19  Yes Ocean Schildt, Jaynie Crumble, MD   hydroCHLOROthiazide (HYDRODIURIL) 25 mg tablet Take 1 Tab by mouth daily. 06/11/19  Yes Dafna Romo, Jaynie Crumble, MD   amLODIPine (Norvasc) 10 mg tablet Take 10 mg by mouth daily. Indications: Brand name only 03/25/19  Yes Provider, Historical   aspirin delayed-release 81 mg tablet Take 81 mg by mouth daily.   Yes Provider, Historical   celecoxib (CELEBREX) 200 mg capsule Take 200 mg by mouth two (2) times a day. Indications: Patient does brand name only 03/25/19  Yes Provider, Historical   furosemide (LASIX) 20 mg tablet Take 10 mg by mouth daily as needed. 02/18/18  Yes Provider, Historical   loratadine (CLARITIN) 10 mg tablet Take 10 mg by mouth daily.   Yes Provider, Historical   pantoprazole (PROTONIX) 40 mg tablet Take 40 mg by mouth two (2) times a day. 04/14/19  Yes Provider, Historical   red yeast rice extract 600 mg cap Take 4 Caps by mouth daily.   Yes Provider, Historical   valsartan (Diovan) 40 mg tablet Take 120 mg by mouth daily. Indications: Brand name only 08/26/18  Yes Provider,  Historical   olopatadine (PATADAY) 0.2 % drop ophthalmic solution Administer 1 Drop to both eyes daily as needed.   Yes Provider, Historical     Patient Active Problem List   Diagnosis Code   ??? 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 Carotid stenosis I65.29   ??? 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 Chronic back pain greater than 3 months duration M54.9, G89.29   ??? Chronic insomnia F51.04   ??? 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 Essential hypertension I10   ??? Gait instability R26.81   ??? Hypercholesterolemia E78.00   ??? Hyponatremia E87.1   ??? Injury of left knee S89.92XA   ??? Lower extremity edema R60.0   ??? 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   ??? 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 Obstructive sleep apnea G47.33   ??? 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 PAD (peripheral artery disease) (HCC) I73.9   ???  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 Raynaud phenomenon I73.00   ??? 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 Situational anxiety F41.8   ??? Vaginitis, atrophic N95.2   ??? Vertigo R42       Review of Systems   Constitutional: Negative for chills, fever and malaise/fatigue.   HENT: Negative for congestion and sore throat.    Respiratory: Negative for cough and shortness of breath.    Cardiovascular: Negative for chest pain and palpitations.   Gastrointestinal: Negative for abdominal pain, heartburn, nausea and vomiting.   Genitourinary: Negative for dysuria and urgency.   Musculoskeletal: Positive for myalgias. Negative for joint pain.   Neurological: Negative for dizziness, tingling and headaches.   Psychiatric/Behavioral: The patient has insomnia.    All other systems reviewed and are negative.      No flowsheet data found.     Lindsay Berry, who was evaluated through a patient-initiated, synchronous (real-time) audio only encounter, and/or her healthcare decision maker, is aware that it is a billable service, with coverage as determined by her insurance carrier. She provided verbal consent to proceed: Yes. She has not had a related appointment within my department in the past 7 days or scheduled within the next 24 hours.      Total Time: minutes: 11-20 minutes    Sheron Nightingale Afomia Blackley, MD

## 2019-06-20 NOTE — Progress Notes (Signed)
Chief Complaint   Patient presents with   ??? Medication Evaluation     Pt was evaluated via phone call only.     Patient is requesting a refill on her Valium prescription from 30 pills a month to 60 pills a month.  Patient reports that she takes an entire pill every night to help her sleep and then will take an additional half to 1 pill during the day as needed.  Patient reports the primary reason she is taking the medication is due to muscle spasms and old injuries that cause pain and prevent her from sleeping.    Patient reports that she does not respond well to pain medication, has had side effects with muscle relaxers in the past and that Valium works the best for her.    Patient reports that she is aware of the risks of Valium both as a long-term medication and given her age, however would like to continue at this time.     Lindsay Berry is a 78 y.o. female, evaluated via audio-only technology on 06/20/2019 for Medication Evaluation  .    Assessment & Plan:   Diagnoses and all orders for this visit:    1. Chronic insomnia  -     diazePAM (Valium) 2 mg tablet; Take 0.5-1 Tabs by mouth every twelve (12) hours as needed for Anxiety. Max Daily Amount: 4 mg. MUST LAST 30 DAYS    2. Other chronic pain  -     diazePAM (Valium) 2 mg tablet; Take 0.5-1 Tabs by mouth every twelve (12) hours as needed for Anxiety. Max Daily Amount: 4 mg. MUST LAST 30 DAYS    Spoke with patient at length regarding the concerns with this medication.  Advised that I will refill for 45 pills/month, with the hope of gradually decreasing.  Strongly encourage patient to consider other options to help with sleep.  Recommended trying an alternate muscle relaxer, possibly including baclofen.  Patient agreed and was willing to try reducing the medication.  Patient aware of current risks    12  Subjective:       Prior to Admission medications    Medication Sig Start Date End Date Taking? Authorizing Provider    diazePAM (Valium) 2 mg tablet Take 0.5-1 Tabs by mouth every twelve (12) hours as needed for Anxiety. Max Daily Amount: 4 mg. 06/16/19  Yes Asser Lucena, Jaynie Crumble, MD   hydroCHLOROthiazide (HYDRODIURIL) 25 mg tablet Take 1 Tab by mouth daily. 06/11/19  Yes Beila Purdie, Jaynie Crumble, MD   amLODIPine (Norvasc) 10 mg tablet Take 10 mg by mouth daily. Indications: Brand name only 03/25/19  Yes Provider, Historical   aspirin delayed-release 81 mg tablet Take 81 mg by mouth daily.   Yes Provider, Historical   celecoxib (CELEBREX) 200 mg capsule Take 200 mg by mouth two (2) times a day. Indications: Patient does brand name only 03/25/19  Yes Provider, Historical   furosemide (LASIX) 20 mg tablet Take 10 mg by mouth daily as needed. 02/18/18  Yes Provider, Historical   loratadine (CLARITIN) 10 mg tablet Take 10 mg by mouth daily.   Yes Provider, Historical   pantoprazole (PROTONIX) 40 mg tablet Take 40 mg by mouth two (2) times a day. 04/14/19  Yes Provider, Historical   red yeast rice extract 600 mg cap Take 4 Caps by mouth daily.   Yes Provider, Historical   valsartan (Diovan) 40 mg tablet Take 120 mg by mouth daily. Indications: Brand name only 08/26/18  Yes Provider,  Historical   olopatadine (PATADAY) 0.2 % drop ophthalmic solution Administer 1 Drop to both eyes daily as needed.   Yes Provider, Historical     Patient Active Problem List   Diagnosis Code   ??? 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 Carotid stenosis I65.29   ??? 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 Chronic back pain greater than 3 months duration M54.9, G89.29   ??? Chronic insomnia F51.04   ??? 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 Essential hypertension I10   ??? Gait instability R26.81   ??? Hypercholesterolemia E78.00   ??? Hyponatremia E87.1   ??? Injury of left knee S89.92XA   ??? Lower extremity edema R60.0   ??? 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   ??? 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 Obstructive sleep apnea G47.33   ??? 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 PAD (peripheral artery disease) (HCC) I73.9   ??? 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 Raynaud phenomenon I73.00   ??? 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 Situational anxiety F41.8   ??? Vaginitis, atrophic N95.2   ??? Vertigo R42       Review of Systems   Constitutional: Negative for chills, fever and malaise/fatigue.   HENT: Negative for congestion and sore throat.    Respiratory: Negative for cough and shortness of breath.    Cardiovascular: Negative for chest pain and palpitations.   Gastrointestinal: Negative for abdominal pain, heartburn, nausea and vomiting.   Genitourinary: Negative for dysuria and urgency.   Musculoskeletal: Positive for myalgias. Negative for joint pain.   Neurological: Negative for dizziness, tingling and headaches.   Psychiatric/Behavioral: The patient has insomnia.    All other systems reviewed and are negative.      No flowsheet data found.     Lindsay Berry, who was evaluated through a patient-initiated, synchronous (real-time) audio only encounter, and/or her healthcare decision maker, is aware that it is a billable service, with coverage as determined by her insurance carrier. She provided verbal consent to proceed: Yes. She has not had a related appointment within my department in the past 7 days or scheduled within the next 24 hours.      Total Time: minutes: 11-20 minutes    Sheron Nightingale Joakim Huesman, MD

## 2019-06-20 NOTE — Progress Notes (Signed)
1. Have you been to the ER, urgent care clinic since your last visit?  Hospitalized since your last visit?No    2. Have you seen or consulted any other health care providers outside of the [facility] since your last visit?  Include any pap smears or colon screening. No     Health Maintenance Due   Topic Date Due   ??? DTaP/Tdap/Td series (1 - Tdap) 08/22/1960   ??? Shingrix Vaccine Age 80> (1 of 2) 08/22/1989   ??? GLAUCOMA SCREENING Q2Y  08/22/2004   ??? Bone Densitometry (Dexa) Screening  08/22/2004   ??? Medicare Yearly Exam  05/13/2019

## 2019-07-09 NOTE — Telephone Encounter (Signed)
Patient calling to get set up for podiatry

## 2019-07-16 ENCOUNTER — Other Ambulatory Visit: Payer: Self-pay | Admitting: Family Medicine

## 2019-07-22 ENCOUNTER — Telehealth

## 2019-07-22 NOTE — Telephone Encounter (Signed)
Patient wants to know which podiatry Dr. Matilde Haymaker recommends?    Ingrown toenails on both feet

## 2019-07-23 NOTE — Telephone Encounter (Signed)
Please refer to Dr. Marvell Fuller. Referral written.

## 2019-07-25 NOTE — Telephone Encounter (Signed)
Unable to reach pt, left voicemail with info

## 2019-08-11 MED ORDER — AMLODIPINE 10 MG TAB
10 mg | ORAL_TABLET | Freq: Every day | ORAL | 3 refills | Status: DC
Start: 2019-08-11 — End: 2020-05-20

## 2019-08-11 MED ORDER — CELECOXIB 200 MG CAP
200 mg | ORAL_CAPSULE | Freq: Two times a day (BID) | ORAL | 0 refills | Status: DC
Start: 2019-08-11 — End: 2020-06-08

## 2019-08-11 NOTE — Telephone Encounter (Signed)
[facility] is requesting a refill on med    Requested Prescriptions     Pending Prescriptions Disp Refills   ??? celecoxib (CELEBREX) 200 mg capsule        Sig: Take 1 Cap by mouth two (2) times a day. Indications: Patient does brand name only

## 2019-08-11 NOTE — Telephone Encounter (Signed)
[facility] is requesting a refill on med    Requested Prescriptions     Pending Prescriptions Disp Refills   ??? amLODIPine (Norvasc) 10 mg tablet        Sig: Take 1 Tab by mouth daily. Indications: Brand name only

## 2019-08-12 MED ORDER — VALSARTAN 40 MG TAB
40 mg | ORAL_TABLET | Freq: Every day | ORAL | 3 refills | Status: DC
Start: 2019-08-12 — End: 2019-08-13

## 2019-08-12 NOTE — Telephone Encounter (Signed)
[facility] is requesting a refill on med    Requested Prescriptions     Pending Prescriptions Disp Refills   ??? valsartan (Diovan) 40 mg tablet        Sig: Take 3 Tabs by mouth daily. Indications: Brand name only

## 2019-08-12 NOTE — Telephone Encounter (Signed)
Requested Prescriptions     Pending Prescriptions Disp Refills   ??? valsartan (Diovan) 40 mg tablet        Sig: Take 3 Tabs by mouth daily. Indications: Brand name only     90 day supply

## 2019-08-13 ENCOUNTER — Encounter

## 2019-08-13 MED ORDER — VALSARTAN 40 MG TAB
40 mg | ORAL_TABLET | Freq: Every day | ORAL | 3 refills | Status: DC
Start: 2019-08-13 — End: 2019-10-06

## 2019-08-13 NOTE — Telephone Encounter (Signed)
Please advise patient that refill has been completed as requested

## 2019-08-13 NOTE — Telephone Encounter (Signed)
Patient calling in for 90 day refill to go to [facility]	[place]. Please call patient when this is done at [phone]

## 2019-08-13 NOTE — Telephone Encounter (Signed)
Pharmacy states pt wants a 90 day supply    Requested Prescriptions     Pending Prescriptions Disp Refills   ??? diazePAM (Valium) 2 mg tablet 45 Tab 2     Sig: Take 0.5-1 Tabs by mouth every twelve (12) hours as needed for Anxiety. Max Daily Amount: 4 mg. MUST LAST 30 DAYS

## 2019-08-14 NOTE — Telephone Encounter (Signed)
Tonya calling from [facility] to get 90 day supply for valium per patient request

## 2019-08-14 NOTE — Telephone Encounter (Signed)
DOB verified.  Pt notified.

## 2019-08-18 ENCOUNTER — Encounter

## 2019-08-18 NOTE — Telephone Encounter (Signed)
Please advise that there should still be refills remaining on this medication. Pt is next due for a refill on 09/19/19

## 2019-08-18 NOTE — Telephone Encounter (Signed)
Message from [facility]    Dr. Royce Macadamia   Received: Today   Message Contents   [facility] Front Office Pool       ??       General Message/Vendor Calls     [name]'s first and last name: Jason-[facility]       Reason for call: Valium 90 day request       Callback required yes/no and why: Yes, if fax was never received       Best contact number(s): [phone]       Details to clarify the request: Barbara Cower is calling to see if office received the request for pt's Valium for 90 days. They have yet to hear back from office. Please advise.       Michele Mcalpine      Requested Prescriptions     Pending Prescriptions Disp Refills   ??? diazePAM (Valium) 2 mg tablet 45 Tab 2     Sig: Take 0.5-1 Tabs by mouth every twelve (12) hours as needed for Anxiety. Max Daily Amount: 4 mg. MUST LAST 30 DAYS

## 2019-10-01 NOTE — Telephone Encounter (Signed)
Patient calling to speak to nurse about handicap placard. I tried to put her on the schedule, patient wanted vv. I told her in order to get forms signed that it would probably be better for [place] to do an in office visit. She does not have forms and asked if she could have forms filled out before hand so she can have daughters come pick it up.

## 2019-10-02 NOTE — Telephone Encounter (Signed)
Do you need an appt to fill out her handicap placard?

## 2019-10-03 NOTE — Telephone Encounter (Signed)
No appt needed for handicap placard only.     Please put a blank form with her name on my desk for completion Monday.     We will notify pt once complete.

## 2019-10-06 MED ORDER — VALSARTAN 40 MG TAB
40 mg | ORAL_TABLET | Freq: Every day | ORAL | 3 refills | Status: DC
Start: 2019-10-06 — End: 2019-10-13

## 2019-10-06 NOTE — Telephone Encounter (Signed)
[facility] faxed request

## 2019-10-09 NOTE — Telephone Encounter (Signed)
Patient medication was sent to the wrong pharmacy.    Valsartan needs to be sent to [facility].    90 day supply with 4 refills

## 2019-10-13 MED ORDER — VALSARTAN 40 MG TAB
40 mg | ORAL_TABLET | Freq: Every day | ORAL | 3 refills | Status: DC
Start: 2019-10-13 — End: 2019-11-10

## 2019-10-13 NOTE — Addendum Note (Signed)
Addendum Note by Lazariah Savard, Sheron Nightingale, MD at 10/13/19 1721                Author: [place]en [place], Sheron Nightingale, MD  Service: --  Author Type: Physician       Filed: 10/13/19 1721  Encounter Date: 10/09/2019  Status: Signed          Editor: Jerin Franzel, Sheron Nightingale, MD (Physician)          Addended by: Baron Sane on: 10/13/2019 05:21 PM    Modules accepted: Orders

## 2019-10-13 NOTE — Addendum Note (Signed)
Addended by: Baron Sane on: 10/13/2019 05:21 PM     Modules accepted: Orders

## 2019-10-13 NOTE — Telephone Encounter (Signed)
Please advise pt that Valsartan was sent to [facility].

## 2019-10-13 NOTE — Telephone Encounter (Signed)
Pt called to check on status of sending script to [facility]. Pt has a little more than a weeks worth of pills left and is concerned about mailing time from [facility]. Pt states that [facility] said that they can expedite the order if we call them to get the script set up. Please call pt when orders have been sent so she will know it was taken care of.    [phone]    Thank you,  Kris Hartmann

## 2019-10-14 NOTE — Telephone Encounter (Signed)
Unable to reach pt, left voicemail

## 2019-10-23 NOTE — Telephone Encounter (Signed)
Pt was able to get generic from [facility] because mail order pharmacy stated it would take a month to get her Rx out to her.

## 2019-11-10 MED ORDER — HYDROCHLOROTHIAZIDE 25 MG TAB
25 mg | ORAL_TABLET | Freq: Every day | ORAL | 3 refills | Status: DC
Start: 2019-11-10 — End: 2020-07-28

## 2019-11-10 MED ORDER — PANTOPRAZOLE 40 MG TAB, DELAYED RELEASE
40 mg | ORAL_TABLET | Freq: Two times a day (BID) | ORAL | 1 refills | Status: DC
Start: 2019-11-10 — End: 2020-03-04

## 2019-11-10 MED ORDER — VALSARTAN 40 MG TAB
40 mg | ORAL_TABLET | Freq: Every day | ORAL | 3 refills | Status: DC
Start: 2019-11-10 — End: 2020-03-10

## 2019-11-10 MED ORDER — VALSARTAN 80 MG TAB
80 mg | ORAL_TABLET | Freq: Every day | ORAL | 3 refills | Status: DC
Start: 2019-11-10 — End: 2020-03-10

## 2019-11-10 NOTE — Telephone Encounter (Signed)
PLEASE CALL PT BEFORE FILLING    Pt called to get script refilled. She wants to get a 40 MG & 80 MG pill. It will save her over $800 annually if she does it this way. Please call pt to advise if this will work for her.    [facility] [employer]

## 2019-11-10 NOTE — Telephone Encounter (Signed)
Please advise patient that this is fine, prescription for 80 mg tablet and a prescription for 40 mg tablet were both sent to [facility] on file

## 2019-11-10 NOTE — Telephone Encounter (Signed)
Received fax from [facility] for refill of:    Requested Prescriptions     Pending Prescriptions Disp Refills   ??? pantoprazole (PROTONIX) 40 mg tablet        Sig: Take 1 Tab by mouth two (2) times a day.   ??? hydroCHLOROthiazide (HYDRODIURIL) 25 mg tablet 90 Tab 3     Sig: Take 1 Tab by mouth daily.

## 2019-12-16 ENCOUNTER — Other Ambulatory Visit: Admit: 2019-12-16 | Discharge: 2019-12-16 | Payer: MEDICARE | Attending: Family Medicine | Primary: Family Medicine

## 2019-12-16 ENCOUNTER — Encounter: Attending: Family Medicine | Primary: Family Medicine

## 2019-12-16 DIAGNOSIS — I10 Essential (primary) hypertension: Secondary | ICD-10-CM

## 2019-12-16 NOTE — Progress Notes (Signed)
LM to schedule appt for repeat lab draw.

## 2019-12-16 NOTE — Addendum Note (Signed)
Addendum  Note by Faythe Casa at 12/16/19 0354                Author: Faythe Casa  Service: --  Author Type: Technician       Filed: 12/16/19 0838  Encounter Date: 12/16/2019  Status: Signed          Editor: Faythe Casa (Technician)          Addended by: Faythe Casa on: 12/16/2019 08:38 AM    Modules accepted: Orders

## 2019-12-16 NOTE — Progress Notes (Signed)
Pt notified and voiced understanding 

## 2019-12-16 NOTE — Progress Notes (Signed)
Please recheck sodium this week.

## 2019-12-16 NOTE — Progress Notes (Signed)
Vitamin D is slightly low, please take a daily supplement of at least 2000 IU (international units) daily.   Cholesterol well controlled  Sodium low  All other labs are within normal limits.   Reviewed during appt.

## 2019-12-16 NOTE — Addendum Note (Signed)
Addended by: Faythe Casa on: 12/16/2019 08:38 AM     Modules accepted: Orders

## 2019-12-16 NOTE — Progress Notes (Signed)
Pt notified and voiced understanding

## 2019-12-16 NOTE — Progress Notes (Signed)
LM to schedule appt for repeat lab draw.

## 2019-12-16 NOTE — Progress Notes (Signed)
Please recheck sodium this week.

## 2019-12-16 NOTE — Progress Notes (Signed)
Vitamin D is slightly low, please take a daily supplement of at least 2000 IU (international units) daily.   Cholesterol well controlled  Sodium low  All other labs are within normal limits.   Reviewed during appt.

## 2019-12-17 LAB — LIPID PANEL
CHOL/HDL Ratio: 2.2 (ref 0.0–5.0)
Chol/HDL Ratio: 2.2 (ref 0.0–5.0)
Cholesterol, Total: 214 MG/DL — ABNORMAL HIGH (ref <200)
Cholesterol, total: 214 MG/DL — ABNORMAL HIGH (ref <200)
HDL Cholesterol: 96 MG/DL
HDL: 96 MG/DL
LDL Calculated: 95.2 MG/DL (ref 0–100)
LDL, calculated: 95.2 MG/DL (ref 0–100)
Triglyceride: 114 MG/DL (ref <150)
Triglycerides: 114 MG/DL (ref <150)
VLDL Cholesterol Calculated: 22.8 MG/DL
VLDL, calculated: 22.8 MG/DL

## 2019-12-17 LAB — CBC W/O DIFF
ABSOLUTE NRBC: 0 10*3/uL (ref 0.00–0.01)
HCT: 43.7 % (ref 35.0–47.0)
HGB: 15.1 g/dL (ref 11.5–16.0)
MCH: 30.9 PG (ref 26.0–34.0)
MCHC: 34.6 g/dL (ref 30.0–36.5)
MCV: 89.5 FL (ref 80.0–99.0)
MPV: 8.8 FL — ABNORMAL LOW (ref 8.9–12.9)
NRBC: 0 PER 100 WBC
PLATELET: 317 10*3/uL (ref 150–400)
RBC: 4.88 M/uL (ref 3.80–5.20)
RDW: 12 % (ref 11.5–14.5)
WBC: 6.2 10*3/uL (ref 3.6–11.0)

## 2019-12-17 LAB — HEMOGLOBIN A1C WITH EAG
Est. average glucose: 108 mg/dL
Hemoglobin A1c: 5.4 % (ref 4.0–5.6)

## 2019-12-17 LAB — VITAMIN D, 25 HYDROXY: Vitamin D 25-Hydroxy: 13.5 ng/mL — ABNORMAL LOW (ref 30–100)

## 2019-12-17 LAB — METABOLIC PANEL, COMPREHENSIVE
A-G Ratio: 1.4 (ref 1.1–2.2)
ALT (SGPT): 40 U/L (ref 12–78)
AST (SGOT): 22 U/L (ref 15–37)
Albumin: 4.6 g/dL (ref 3.5–5.0)
Alk. phosphatase: 117 U/L (ref 45–117)
Anion gap: 6 mmol/L (ref 5–15)
BUN/Creatinine ratio: 32 — ABNORMAL HIGH (ref 12–20)
BUN: 23 MG/DL — ABNORMAL HIGH (ref 6–20)
Bilirubin, total: 0.7 MG/DL (ref 0.2–1.0)
CO2: 28 mmol/L (ref 21–32)
Calcium: 9.7 MG/DL (ref 8.5–10.1)
Chloride: 92 mmol/L — ABNORMAL LOW (ref 97–108)
Creatinine: 0.72 MG/DL (ref 0.55–1.02)
GFR est AA: >60 mL/min/{1.73_m2} (ref >60)
GFR est non-AA: >60 mL/min/{1.73_m2} (ref >60)
Globulin: 3.2 g/dL (ref 2.0–4.0)
Glucose: 101 mg/dL — ABNORMAL HIGH (ref 65–100)
Potassium: 4.2 mmol/L (ref 3.5–5.1)
Protein, total: 7.8 g/dL (ref 6.4–8.2)
Sodium: 126 mmol/L — ABNORMAL LOW (ref 136–145)

## 2019-12-17 LAB — TSH 3RD GENERATION
TSH: 1.73 u[IU]/mL (ref 0.36–3.74)
TSH: 1.73 u[IU]/mL (ref 0.36–3.74)

## 2019-12-17 LAB — COMPREHENSIVE METABOLIC PANEL
ALT: 40 U/L (ref 12–78)
AST: 22 U/L (ref 15–37)
Albumin/Globulin Ratio: 1.4 (ref 1.1–2.2)
Albumin: 4.6 g/dL (ref 3.5–5.0)
Alkaline Phosphatase: 117 U/L (ref 45–117)
Anion Gap: 6 mmol/L (ref 5–15)
BUN: 23 MG/DL — ABNORMAL HIGH (ref 6–20)
Bun/Cre Ratio: 32 — ABNORMAL HIGH (ref 12–20)
CO2: 28 mmol/L (ref 21–32)
Calcium: 9.7 MG/DL (ref 8.5–10.1)
Chloride: 92 mmol/L — ABNORMAL LOW (ref 97–108)
Creatinine: 0.72 MG/DL (ref 0.55–1.02)
EGFR IF NonAfrican American: >60 mL/min/{1.73_m2} (ref >60)
GFR African American: >60 mL/min/{1.73_m2} (ref >60)
Globulin: 3.2 g/dL (ref 2.0–4.0)
Glucose: 101 mg/dL — ABNORMAL HIGH (ref 65–100)
Potassium: 4.2 mmol/L (ref 3.5–5.1)
Sodium: 126 mmol/L — ABNORMAL LOW (ref 136–145)
Total Bilirubin: 0.7 MG/DL (ref 0.2–1.0)
Total Protein: 7.8 g/dL (ref 6.4–8.2)

## 2019-12-17 LAB — CBC
Hematocrit: 43.7 % (ref 35.0–47.0)
Hemoglobin: 15.1 g/dL (ref 11.5–16.0)
MCH: 30.9 PG (ref 26.0–34.0)
MCHC: 34.6 g/dL (ref 30.0–36.5)
MCV: 89.5 FL (ref 80.0–99.0)
MPV: 8.8 FL — ABNORMAL LOW (ref 8.9–12.9)
NRBC Absolute: 0 10*3/uL (ref 0.00–0.01)
Nucleated RBCs: 0 PER 100 WBC
Platelets: 317 10*3/uL (ref 150–400)
RBC: 4.88 M/uL (ref 3.80–5.20)
RDW: 12 % (ref 11.5–14.5)
WBC: 6.2 10*3/uL (ref 3.6–11.0)

## 2019-12-17 LAB — HEMOGLOBIN A1C W/EAG
Hemoglobin A1C: 5.4 % (ref 4.0–5.6)
eAG: 108 mg/dL

## 2019-12-17 LAB — VITAMIN D 25 HYDROXY: Vit D, 25-Hydroxy: 13.5 ng/mL — ABNORMAL LOW (ref 30–100)

## 2019-12-18 ENCOUNTER — Ambulatory Visit: Attending: Family Medicine | Primary: Family Medicine

## 2019-12-18 ENCOUNTER — Ambulatory Visit: Admit: 2019-12-18 | Discharge: 2019-12-18 | Payer: MEDICARE | Attending: Family Medicine | Primary: Family Medicine

## 2019-12-18 DIAGNOSIS — I739 Peripheral vascular disease, unspecified: Secondary | ICD-10-CM

## 2019-12-18 DIAGNOSIS — R2681 Unsteadiness on feet: Secondary | ICD-10-CM
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
## 2019-12-18 NOTE — Progress Notes (Signed)
 1. Have you been to the ER, urgent care clinic since your last visit?  Hospitalized since your last visit? No    2. Have you seen or consulted any other health care providers outside of the [facility] since your last visit?  Include any pap smears or colon screening. No    Health Maintenance Due   Topic Date Due   . DTaP/Tdap/Td series (1 - Tdap) 08/22/1960   . Shingrix Vaccine Age 81> (1 of 2) Never done   . Bone Densitometry (Dexa) Screening  Never done   . Medicare Yearly Exam  Never done     Chief Complaint   Patient presents with   . Annual Wellness Visit

## 2019-12-18 NOTE — Patient Instructions (Signed)
Medicare Wellness Visit, Female     The best way to live healthy is to have a lifestyle where you eat a well-balanced diet, exercise regularly, limit alcohol use, and quit all forms of tobacco/nicotine, if applicable.     Regular preventive services are another way to keep healthy. Preventive services (vaccines, screening tests, monitoring & exams) can help personalize your care plan, which helps you manage your own care. Screening tests can find health problems at the earliest stages, when they are easiest to treat.   [facility] follows the current, evidence-based guidelines published by the [place] Preventive Services Task Force (USPSTF) when recommending preventive services for our patients. Because we follow these guidelines, sometimes recommendations change over time as research supports it. (For example, mammograms used to be recommended annually. Even though Medicare will still pay for an annual mammogram, the newer guidelines recommend a mammogram every two years for women of average risk).  Of course, you and your doctor may decide to screen more often for some diseases, based on your risk and your co-morbidities (chronic disease you are already diagnosed with).     Preventive services for you include:  - Medicare offers their members a free annual wellness visit, which is time for you and your primary care provider to discuss and plan for your preventive service needs. Take advantage of this benefit every year!  -All adults over the age of 65 should receive the recommended pneumonia vaccines. Current USPSTF guidelines recommend a series of two vaccines for the best pneumonia protection.   -All adults should have a flu vaccine yearly and a tetanus vaccine every 10 years.   -All adults age 50 and older should receive the shingles vaccines (series of two vaccines).      -All adults age 40-70 who are overweight should have a diabetes screening test once every three years.   -All  adults born between 1945 and 1965 should be screened once for Hepatitis C.  -Other screening tests and preventive services for persons with diabetes include: an eye exam to screen for diabetic retinopathy, a kidney function test, a foot exam, and stricter control over your cholesterol.   -Cardiovascular screening for adults with routine risk involves an electrocardiogram (ECG) at intervals determined by your doctor.   -Colorectal cancer screenings should be done for adults age 50-75 with no increased risk factors for colorectal cancer.  There are a number of acceptable methods of screening for this type of cancer. Each test has its own benefits and drawbacks. Discuss with your doctor what is most appropriate for you during your annual wellness visit. The different tests include: colonoscopy (considered the best screening method), a fecal occult blood test, a fecal DNA test, and sigmoidoscopy.    -A bone mass density test is recommended when a woman turns 65 to screen for osteoporosis. This test is only recommended one time, as a screening. Some providers will use this same test as a disease monitoring tool if you already have osteoporosis.  -Breast cancer screenings are recommended every other year for women of normal risk, age 50-74.  -Cervical cancer screenings for women over age 65 are only recommended with certain risk factors.     Here is a list of your current Health Maintenance items (your personalized list of preventive services) with a due date:  There are no preventive care reminders to display for this patient.

## 2019-12-18 NOTE — Progress Notes (Signed)
1. Have you been to the ER, urgent care clinic since your last visit?  Hospitalized since your last visit? No    2. Have you seen or consulted any other health care providers outside of the [facility] since your last visit?  Include any pap smears or colon screening. No    Health Maintenance Due   Topic Date Due   ??? DTaP/Tdap/Td series (1 - Tdap) 08/22/1962   ??? Shingrix Vaccine Age 81> (1 of 2) Never done   ??? Bone Densitometry (Dexa) Screening  Never done   ??? Medicare Yearly Exam  Never done     Chief Complaint   Patient presents with   ??? Annual Wellness Visit
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
## 2019-12-22 ENCOUNTER — Telehealth: Attending: Family Medicine | Primary: Family Medicine

## 2019-12-22 ENCOUNTER — Telehealth: Admit: 2019-12-22 | Discharge: 2019-12-22 | Payer: MEDICARE | Attending: Family Medicine | Primary: Family Medicine

## 2019-12-22 DIAGNOSIS — M62830 Muscle spasm of back: Secondary | ICD-10-CM

## 2019-12-22 MED ORDER — PREDNISONE 5 MG TABLETS IN A DOSE PACK
5 mg | ORAL_TABLET | ORAL | 0 refills | Status: DC
Start: 2019-12-22 — End: 2020-06-21

## 2019-12-22 NOTE — Progress Notes (Signed)
1. Have you been to the ER, urgent care clinic since your last visit?  Hospitalized since your last visit?No    2. Have you seen or consulted any other health care providers outside of the [facility] since your last visit?  Include any pap smears or colon screening. No    There are no preventive care reminders to display for this patient.    Do you have an Advance Care Plan in place in the event that you have a healthcare crisis that could impact your decision making as it pertains to your health?YES    Would you like information about Advance Care Planning? NO    Information given. NO

## 2019-12-22 NOTE — Progress Notes (Signed)
Chief Complaint   Patient presents with   ??? Back Pain     MUSCLE SPASMS IN LUMBAR REGION     Pt was evaluated via phone call only.     Pt reports that she has been experiencing what feels like back spasms for several days.     Pt has taken OTC medication without resolution in pain.     Lindsay Berry is a 79 y.o. female, evaluated via audio-only technology on 12/22/2019 for Back Pain ([place])  .    Assessment & Plan:   Diagnoses and all orders for this visit:    1. Spasm of muscle of lower back  -     predniSONE (STERAPRED) 5 mg dose pack; See administration instruction per 5mg  dose pack    2. Hyponatremia    Scheduled pt for sodium recheck, currently asymptomatic. Pt unable to come in sooner, reviewed signs and symptoms to swatch out for    Advised to take medication as written  12  Subjective:       Prior to Admission medications    Medication Sig Start Date End Date Taking? Authorizing Provider   predniSONE (STERAPRED) 5 mg dose pack See administration instruction per 5mg  dose pack 12/22/19  Yes Kanoe Wanner, Jaynie Crumble, MD   valsartan (Diovan) 40 mg tablet Take 1 Tab by mouth daily. Indications: Brand name only 11/10/19  Yes Maddilynn Esperanza, Jaynie Crumble, MD   pantoprazole (PROTONIX) 40 mg tablet Take 1 Tab by mouth two (2) times a day. 11/10/19  Yes Renatta Shrieves, Jaynie Crumble, MD   hydroCHLOROthiazide (HYDRODIURIL) 25 mg tablet Take 1 Tab by mouth daily. 11/10/19  Yes Keaden Gunnoe, Jaynie Crumble, MD   valsartan (DIOVAN) 80 mg tablet Take 1 Tab by mouth daily. Please take with 40mg  tablet 11/10/19  Yes Braniyah Besse, Jaynie Crumble, MD   amLODIPine (Norvasc) 10 mg tablet Take 1 Tab by mouth daily. Indications: Brand name only 08/11/19  Yes Ewel Lona, Jaynie Crumble, MD   celecoxib (CELEBREX) 200 mg capsule Take 1 Cap by mouth two (2) times a day. Indications: Patient does brand name only 08/11/19  Yes Kingslee Mairena, Jaynie Crumble, MD   diazePAM (Valium) 2 mg tablet Take 0.5-1 Tabs by mouth every twelve (12) hours as needed for  Anxiety. Max Daily Amount: 4 mg. MUST LAST 30 DAYS 06/20/19  Yes Merriel Zinger, Jaynie Crumble, MD   aspirin delayed-release 81 mg tablet Take 81 mg by mouth daily.   Yes Provider, Historical   furosemide (LASIX) 20 mg tablet Take 10 mg by mouth daily as needed. 02/18/18  Yes Provider, Historical   loratadine (CLARITIN) 10 mg tablet Take 10 mg by mouth daily.   Yes Provider, Historical   red yeast rice extract 600 mg cap Take 4 Caps by mouth daily.   Yes Provider, Historical   olopatadine (PATADAY) 0.2 % drop ophthalmic solution Administer 1 Drop to both eyes daily as needed.   Yes Provider, Historical     Allergies   Allergen Reactions   ??? Ace Inhibitors Swelling     REACTION: swelling   ??? Beta-Blockers (Beta-Adrenergic Blocking Agts) Palpitations     REACTION: swelling   ??? Neomycin Swelling   ??? Statins-Hmg-Coa Reductase Inhibitors Swelling     REACTION: myalgia   ??? Tramadol Swelling     Does not want to take again.????  Constipation, decreased appetite, hot/chills, did not do well with it       Review of Systems   Constitutional: Negative for chills, fever and  malaise/fatigue.   HENT: Negative for congestion and sore throat.    Respiratory: Negative for cough and shortness of breath.    Cardiovascular: Negative for chest pain and palpitations.   Gastrointestinal: Negative for abdominal pain, heartburn, nausea and vomiting.   Genitourinary: Negative for dysuria and urgency.   Musculoskeletal: Positive for back pain and myalgias. Negative for joint pain.   Neurological: Negative for dizziness, tingling and headaches.   All other systems reviewed and are negative.      No flowsheet data found.     Lindsay Berry, who was evaluated through a patient-initiated, synchronous (real-time) audio only encounter, and/or her healthcare decision maker, is aware that it is a billable service, with coverage as determined by her insurance carrier. She provided verbal consent to proceed: Yes. She has not had a related appointment within my  department in the past 7 days or scheduled within the next 24 hours.      Total Time: minutes: 11-20 minutes    Sheron Nightingale Kepler Mccabe, MD

## 2019-12-22 NOTE — Progress Notes (Signed)
1. Have you been to the ER, urgent care clinic since your last visit?  Hospitalized since your last visit?No    2. Have you seen or consulted any other health care providers outside of the [facility] since your last visit?  Include any pap smears or colon screening. No    There are no preventive care reminders to display for this patient.    Do you have an Advance Care Plan in place in the event that you have a healthcare crisis that could impact your decision making as it pertains to your health?YES    Would you like information about Advance Care Planning? NO    Information given. NO

## 2019-12-22 NOTE — Progress Notes (Signed)
Chief Complaint   Patient presents with   ??? Back Pain     MUSCLE SPASMS IN LUMBAR REGION     Pt was evaluated via phone call only.     Pt reports that she has been experiencing what feels like back spasms for several days.     Pt has taken OTC medication without resolution in pain.     Lindsay Berry is a 78 y.o. female, evaluated via audio-only technology on 12/22/2019 for Back Pain (MUSCLE SPASMS IN LUMBAR REGION)  .    Assessment & Plan:   Diagnoses and all orders for this visit:    1. Spasm of muscle of lower back  -     predniSONE (STERAPRED) 5 mg dose pack; See administration instruction per 5mg dose pack    2. Hyponatremia    Scheduled pt for sodium recheck, currently asymptomatic. Pt unable to come in sooner, reviewed signs and symptoms to swatch out for    Advised to take medication as written  12  Subjective:       Prior to Admission medications    Medication Sig Start Date End Date Taking? Authorizing Provider   predniSONE (STERAPRED) 5 mg dose pack See administration instruction per 5mg dose pack 12/22/19  Yes Judithe Keetch Brianne, MD   valsartan (Diovan) 40 mg tablet Take 1 Tab by mouth daily. Indications: Brand name only 11/10/19  Yes Kimarion Chery Brianne, MD   pantoprazole (PROTONIX) 40 mg tablet Take 1 Tab by mouth two (2) times a day. 11/10/19  Yes Afiya Ferrebee Brianne, MD   hydroCHLOROthiazide (HYDRODIURIL) 25 mg tablet Take 1 Tab by mouth daily. 11/10/19  Yes Jaydyn Bozzo Brianne, MD   valsartan (DIOVAN) 80 mg tablet Take 1 Tab by mouth daily. Please take with 40mg tablet 11/10/19  Yes Astryd Pearcy Brianne, MD   amLODIPine (Norvasc) 10 mg tablet Take 1 Tab by mouth daily. Indications: Brand name only 08/11/19  Yes Iverna Hammac Brianne, MD   celecoxib (CELEBREX) 200 mg capsule Take 1 Cap by mouth two (2) times a day. Indications: Patient does brand name only 08/11/19  Yes Arnette Driggs Brianne, MD   diazePAM (Valium) 2 mg tablet Take 0.5-1 Tabs by mouth every twelve (12) hours as needed for  Anxiety. Max Daily Amount: 4 mg. MUST LAST 30 DAYS 06/20/19  Yes Farra Nikolic Brianne, MD   aspirin delayed-release 81 mg tablet Take 81 mg by mouth daily.   Yes Provider, Historical   furosemide (LASIX) 20 mg tablet Take 10 mg by mouth daily as needed. 02/18/18  Yes Provider, Historical   loratadine (CLARITIN) 10 mg tablet Take 10 mg by mouth daily.   Yes Provider, Historical   red yeast rice extract 600 mg cap Take 4 Caps by mouth daily.   Yes Provider, Historical   olopatadine (PATADAY) 0.2 % drop ophthalmic solution Administer 1 Drop to both eyes daily as needed.   Yes Provider, Historical     Allergies   Allergen Reactions   ??? Ace Inhibitors Swelling     REACTION: swelling   ??? Beta-Blockers (Beta-Adrenergic Blocking Agts) Palpitations     REACTION: swelling   ??? Neomycin Swelling   ??? Statins-Hmg-Coa Reductase Inhibitors Swelling     REACTION: myalgia   ??? Tramadol Swelling     Does not want to take again.????  Constipation, decreased appetite, hot/chills, did not do well with it       Review of Systems   Constitutional: Negative for chills, fever and   malaise/fatigue.   HENT: Negative for congestion and sore throat.    Respiratory: Negative for cough and shortness of breath.    Cardiovascular: Negative for chest pain and palpitations.   Gastrointestinal: Negative for abdominal pain, heartburn, nausea and vomiting.   Genitourinary: Negative for dysuria and urgency.   Musculoskeletal: Positive for back pain and myalgias. Negative for joint pain.   Neurological: Negative for dizziness, tingling and headaches.   All other systems reviewed and are negative.      No flowsheet data found.     Lindsay Berry, who was evaluated through a patient-initiated, synchronous (real-time) audio only encounter, and/or her healthcare decision maker, is aware that it is a billable service, with coverage as determined by her insurance carrier. She provided verbal consent to proceed: Yes. She has not had a related appointment within my  department in the past 7 days or scheduled within the next 24 hours.      Total Time: minutes: 11-20 minutes    Sheron Nightingale Kepler Mccabe, MD

## 2019-12-25 ENCOUNTER — Encounter: Attending: Family Medicine | Primary: Family Medicine

## 2019-12-26 ENCOUNTER — Telehealth: Attending: Family Medicine | Primary: Family Medicine

## 2019-12-26 ENCOUNTER — Telehealth: Admit: 2019-12-26 | Payer: MEDICARE | Attending: Family Medicine | Primary: Family Medicine

## 2019-12-26 ENCOUNTER — Encounter

## 2019-12-26 DIAGNOSIS — M62830 Muscle spasm of back: Secondary | ICD-10-CM

## 2019-12-26 NOTE — Progress Notes (Signed)
Chief Complaint   Patient presents with   . Labs     results of vit d, low sodium , medical records sent to her cardiologist         1. Have you been to the ER, urgent care clinic since your last visit?  Hospitalized since your last visit? no    2. Have you seen or consulted any other health care providers outside of the [facility] since your last visit?  Include any pap smears or colon screening.  no

## 2019-12-26 NOTE — Progress Notes (Signed)
Chief Complaint   Patient presents with   ??? Labs     results of vit d, low sodium , medical records sent to her cardiologist     Pt was evaluated via phone call only.     Patient was interested in repeating a prednisone pack due to some lingering back spasms.  Patient reports that her symptoms have improved significantly, however not fully resolved.    Patient has been trying to eat more sodium, has lab follow-up scheduled for next week.  Patient scheduled appointment with a cardiologist, is requesting records be sent to Dr. Shirlyn Goltz.     Advised patient to remain off of her fluid pills until sodium is rechecked.  Patient agreed    Erminie Foulks is a 79 y.o. female, evaluated via audio-only technology on 12/26/2019 for Labs (results of vit d, low sodium , medical records sent to her cardiologist)  .    Assessment & Plan:   Diagnoses and all orders for this visit:    1. Spasm of muscle of lower back  -Reviewed over-the-counter medications to continue to help with spasm.  Encourage patient to complete home stretches as able.  Patient agreed    2. Hyponatremia  -Recheck on Monday as scheduled        12  Subjective:       Prior to Admission medications    Medication Sig Start Date End Date Taking? Authorizing Provider   predniSONE (STERAPRED) 5 mg dose pack See administration instruction per 5mg  dose pack 12/22/19  Yes Quamere Mussell, 12/24/19, MD   valsartan (Diovan) 40 mg tablet Take 1 Tab by mouth daily. Indications: Brand name only 11/10/19  Yes Amariz Flamenco, 01/10/20, MD   pantoprazole (PROTONIX) 40 mg tablet Take 1 Tab by mouth two (2) times a day. 11/10/19  Yes Naftula Donahue, 01/10/20, MD   valsartan (DIOVAN) 80 mg tablet Take 1 Tab by mouth daily. Please take with 40mg  tablet 11/10/19  Yes Roux Brandy, , MD   amLODIPine (Norvasc) 10 mg tablet Take 1 Tab by mouth daily. Indications: Brand name only 08/11/19  Yes Odus Clasby, Sheron Nightingale, MD   celecoxib (CELEBREX) 200 mg capsule Take 1 Cap by mouth two (2) times a  day. Indications: Patient does brand name only 08/11/19  Yes Bryston Colocho, Sheron Nightingale, MD   diazePAM (Valium) 2 mg tablet Take 0.5-1 Tabs by mouth every twelve (12) hours as needed for Anxiety. Max Daily Amount: 4 mg. MUST LAST 30 DAYS 06/20/19  Yes Aaiden Depoy, Sheron Nightingale, MD   aspirin delayed-release 81 mg tablet Take 81 mg by mouth daily.   Yes Provider, Historical   furosemide (LASIX) 20 mg tablet Take 10 mg by mouth daily as needed. 02/18/18  Yes Provider, Historical   loratadine (CLARITIN) 10 mg tablet Take 10 mg by mouth daily.   Yes Provider, Historical   red yeast rice extract 600 mg cap Take 4 Caps by mouth daily.   Yes Provider, Historical   olopatadine (PATADAY) 0.2 % drop ophthalmic solution Administer 1 Drop to both eyes daily as needed.   Yes Provider, Historical   hydroCHLOROthiazide (HYDRODIURIL) 25 mg tablet Take 1 Tab by mouth daily. 11/10/19   Verl Kitson, 02/20/18, MD     Allergies   Allergen Reactions   ??? Ace Inhibitors Swelling     REACTION: swelling   ??? Beta-Blockers (Beta-Adrenergic Blocking Agts) Palpitations     REACTION: swelling   ??? Neomycin Swelling   ??? Statins-Hmg-Coa Reductase Inhibitors Swelling  REACTION: myalgia   ??? Tramadol Swelling     Does not want to take again.????  Constipation, decreased appetite, hot/chills, did not do well with it       Review of Systems   Constitutional: Negative for chills, fever and malaise/fatigue.   HENT: Negative for congestion and sore throat.    Respiratory: Negative for cough and shortness of breath.    Cardiovascular: Negative for chest pain and palpitations.   Gastrointestinal: Negative for abdominal pain, heartburn, nausea and vomiting.   Genitourinary: Negative for dysuria and urgency.   Musculoskeletal: Positive for back pain. Negative for joint pain and myalgias.   Neurological: Positive for dizziness. Negative for tingling and headaches.   All other systems reviewed and are negative.      No flowsheet data found.     [name], who was  evaluated through a patient-initiated, synchronous (real-time) audio only encounter, and/or her healthcare decision maker, is aware that it is a billable service, with coverage as determined by her insurance carrier. She provided verbal consent to proceed: Yes. She has not had a related appointment within my department in the past 7 days or scheduled within the next 24 hours.      Total Time: minutes: 11-20 minutes    Sheron Nightingale Tim Corriher, MD

## 2019-12-26 NOTE — Progress Notes (Signed)
Progress notes and labs faxed to [facility] and confirmation page recv'd.

## 2019-12-26 NOTE — Telephone Encounter (Signed)
Please advise that steroid can not be continued.     We could try a muscle relaxer., continue other medications.

## 2019-12-26 NOTE — Telephone Encounter (Signed)
Error Patient made appt

## 2019-12-26 NOTE — Progress Notes (Signed)
Chief Complaint   Patient presents with   ??? Labs     results of vit d, low sodium , medical records sent to her cardiologist         1. Have you been to the ER, urgent care clinic since your last visit?  Hospitalized since your last visit? no    2. Have you seen or consulted any other health care providers outside of the [facility] since your last visit?  Include any pap smears or colon screening.  no

## 2019-12-26 NOTE — Progress Notes (Signed)
Chief Complaint   Patient presents with   ??? Labs     results of vit d, low sodium , medical records sent to her cardiologist     Pt was evaluated via phone call only.     Patient was interested in repeating a prednisone pack due to some lingering back spasms.  Patient reports that her symptoms have improved significantly, however not fully resolved.    Patient has been trying to eat more sodium, has lab follow-up scheduled for next week.  Patient scheduled appointment with a cardiologist, is requesting records be sent to Dr. Shirlyn Goltz.     Advised patient to remain off of her fluid pills until sodium is rechecked.  Patient agreed    Lindsay Berry is a 79 y.o. female, evaluated via audio-only technology on 12/26/2019 for Labs (results of vit d, low sodium , medical records sent to her cardiologist)  .    Assessment & Plan:   Diagnoses and all orders for this visit:    1. Spasm of muscle of lower back  -Reviewed over-the-counter medications to continue to help with spasm.  Encourage patient to complete home stretches as able.  Patient agreed    2. Hyponatremia  -Recheck on Monday as scheduled        12  Subjective:       Prior to Admission medications    Medication Sig Start Date End Date Taking? Authorizing Provider   predniSONE (STERAPRED) 5 mg dose pack See administration instruction per 5mg  dose pack 12/22/19  Yes Quamere Mussell, 12/24/19, MD   valsartan (Diovan) 40 mg tablet Take 1 Tab by mouth daily. Indications: Brand name only 11/10/19  Yes Amariz Flamenco, 01/10/20, MD   pantoprazole (PROTONIX) 40 mg tablet Take 1 Tab by mouth two (2) times a day. 11/10/19  Yes Naftula Donahue, 01/10/20, MD   valsartan (DIOVAN) 80 mg tablet Take 1 Tab by mouth daily. Please take with 40mg  tablet 11/10/19  Yes Roux Brandy, , MD   amLODIPine (Norvasc) 10 mg tablet Take 1 Tab by mouth daily. Indications: Brand name only 08/11/19  Yes Odus Clasby, Sheron Nightingale, MD   celecoxib (CELEBREX) 200 mg capsule Take 1 Cap by mouth two (2) times a  day. Indications: Patient does brand name only 08/11/19  Yes Bryston Colocho, Sheron Nightingale, MD   diazePAM (Valium) 2 mg tablet Take 0.5-1 Tabs by mouth every twelve (12) hours as needed for Anxiety. Max Daily Amount: 4 mg. MUST LAST 30 DAYS 06/20/19  Yes Aaiden Depoy, Sheron Nightingale, MD   aspirin delayed-release 81 mg tablet Take 81 mg by mouth daily.   Yes Provider, Historical   furosemide (LASIX) 20 mg tablet Take 10 mg by mouth daily as needed. 02/18/18  Yes Provider, Historical   loratadine (CLARITIN) 10 mg tablet Take 10 mg by mouth daily.   Yes Provider, Historical   red yeast rice extract 600 mg cap Take 4 Caps by mouth daily.   Yes Provider, Historical   olopatadine (PATADAY) 0.2 % drop ophthalmic solution Administer 1 Drop to both eyes daily as needed.   Yes Provider, Historical   hydroCHLOROthiazide (HYDRODIURIL) 25 mg tablet Take 1 Tab by mouth daily. 11/10/19   Verl Kitson, 02/20/18, MD     Allergies   Allergen Reactions   ??? Ace Inhibitors Swelling     REACTION: swelling   ??? Beta-Blockers (Beta-Adrenergic Blocking Agts) Palpitations     REACTION: swelling   ??? Neomycin Swelling   ??? Statins-Hmg-Coa Reductase Inhibitors Swelling  REACTION: myalgia   ??? Tramadol Swelling     Does not want to take again.????  Constipation, decreased appetite, hot/chills, did not do well with it       Review of Systems   Constitutional: Negative for chills, fever and malaise/fatigue.   HENT: Negative for congestion and sore throat.    Respiratory: Negative for cough and shortness of breath.    Cardiovascular: Negative for chest pain and palpitations.   Gastrointestinal: Negative for abdominal pain, heartburn, nausea and vomiting.   Genitourinary: Negative for dysuria and urgency.   Musculoskeletal: Positive for back pain. Negative for joint pain and myalgias.   Neurological: Positive for dizziness. Negative for tingling and headaches.   All other systems reviewed and are negative.      No flowsheet data found.     [name], who was  evaluated through a patient-initiated, synchronous (real-time) audio only encounter, and/or her healthcare decision maker, is aware that it is a billable service, with coverage as determined by her insurance carrier. She provided verbal consent to proceed: Yes. She has not had a related appointment within my department in the past 7 days or scheduled within the next 24 hours.      Total Time: minutes: 11-20 minutes    Urban [employer] Brianne Jaziya Obarr, MD

## 2019-12-26 NOTE — Telephone Encounter (Signed)
-----   Message from Francis Gaines sent at 12/26/2019  9:47 AM EDT -----  Regarding: Dr. Risser/ Telephone  Contact: [phone]  General Message/Vendor Calls    [name]'s first and last name:pt      Reason for call: Prednisone      Callback required yes/no and why:yes      Best contact number(s):[phone]      Details to clarify the request: Pt would like to know if she can get a second dose of the prednisone cause it was helping her back.      Francis Gaines

## 2019-12-26 NOTE — Progress Notes (Signed)
Progress notes and labs faxed to [facility] and confirmation page recv'd.

## 2019-12-29 ENCOUNTER — Encounter: Payer: MEDICARE | Attending: Family Medicine | Primary: Family Medicine

## 2019-12-29 ENCOUNTER — Other Ambulatory Visit: Admit: 2019-12-29 | Discharge: 2019-12-29 | Payer: MEDICARE | Attending: Family Medicine | Primary: Family Medicine

## 2019-12-29 DIAGNOSIS — E871 Hypo-osmolality and hyponatremia: Secondary | ICD-10-CM

## 2019-12-29 NOTE — Progress Notes (Signed)
Medication sent to pharmacy on file

## 2019-12-29 NOTE — Progress Notes (Signed)
Sodium improved - no additional changes at this time, recheck in one month.   Please inform

## 2019-12-29 NOTE — Addendum Note (Signed)
Addendum  Note by Faythe Casa at 12/29/19 1150                Author: Faythe Casa  Service: --  Author Type: Technician       Filed: 12/29/19 1224  Encounter Date: 12/29/2019  Status: Signed          Editor: Faythe Casa (Technician)          Addended by: Faythe Casa on: 12/29/2019 12:24 PM    Modules accepted: Orders

## 2019-12-29 NOTE — Progress Notes (Signed)
Pt notified and voiced understanding. Pt is also requesting vit D3 2000 units to be sent to her pharmacy

## 2019-12-29 NOTE — Addendum Note (Signed)
Addended by: Faythe Casa on: 12/29/2019 12:24 PM     Modules accepted: Orders

## 2019-12-29 NOTE — Progress Notes (Signed)
Pt notified and voiced understanding. Pt is also requesting vit D3 2000 units to be sent to her pharmacy

## 2019-12-29 NOTE — Progress Notes (Signed)
Sodium improved - no additional changes at this time, recheck in one month.   Please inform

## 2019-12-30 ENCOUNTER — Encounter: Attending: Family Medicine | Primary: Family Medicine

## 2019-12-30 LAB — METABOLIC PANEL, BASIC
Anion gap: 9 mmol/L (ref 5–15)
BUN/Creatinine ratio: 31 — ABNORMAL HIGH (ref 12–20)
BUN: 24 MG/DL — ABNORMAL HIGH (ref 6–20)
CO2: 26 mmol/L (ref 21–32)
Calcium: 9.8 MG/DL (ref 8.5–10.1)
Chloride: 96 mmol/L — ABNORMAL LOW (ref 97–108)
Creatinine: 0.77 MG/DL (ref 0.55–1.02)
GFR est AA: >60 mL/min/{1.73_m2} (ref >60)
GFR est non-AA: >60 mL/min/{1.73_m2} (ref >60)
Glucose: 87 mg/dL (ref 65–100)
Potassium: 4.2 mmol/L (ref 3.5–5.1)
Sodium: 131 mmol/L — ABNORMAL LOW (ref 136–145)

## 2019-12-30 LAB — BASIC METABOLIC PANEL
Anion Gap: 9 mmol/L (ref 5–15)
BUN: 24 MG/DL — ABNORMAL HIGH (ref 6–20)
Bun/Cre Ratio: 31 — ABNORMAL HIGH (ref 12–20)
CO2: 26 mmol/L (ref 21–32)
Calcium: 9.8 MG/DL (ref 8.5–10.1)
Chloride: 96 mmol/L — ABNORMAL LOW (ref 97–108)
Creatinine: 0.77 MG/DL (ref 0.55–1.02)
EGFR IF NonAfrican American: >60 mL/min/{1.73_m2} (ref >60)
GFR African American: >60 mL/min/{1.73_m2} (ref >60)
Glucose: 87 mg/dL (ref 65–100)
Potassium: 4.2 mmol/L (ref 3.5–5.1)
Sodium: 131 mmol/L — ABNORMAL LOW (ref 136–145)

## 2019-12-31 MED ORDER — CHOLECALCIFEROL (VITAMIN D3) 2,000 UNIT CAPSULE
ORAL_CAPSULE | Freq: Every day | ORAL | 3 refills | Status: AC
Start: 2019-12-31 — End: ?

## 2020-01-05 DIAGNOSIS — Z888 Allergy status to other drugs, medicaments and biological substances status: Secondary | ICD-10-CM | POA: Insufficient documentation

## 2020-01-12 NOTE — Telephone Encounter (Signed)
Patient says that her bp is all over the place and that she is having shortness of breath. She sched for Wednesday with Dr Risser. I wanted to make sure she didn't need to be evaluated sooner.

## 2020-01-12 NOTE — Telephone Encounter (Signed)
Please confirm that pt did follow up with cardiology.     Please see if she can come into the office Tuesday if she is still having SOB

## 2020-01-13 NOTE — Telephone Encounter (Signed)
Unable to reach pt, left VM

## 2020-01-13 NOTE — Telephone Encounter (Signed)
I did not speak with pt, but pt did schedule Vv for tomorrow

## 2020-01-14 ENCOUNTER — Telehealth: Attending: Family Medicine | Primary: Family Medicine

## 2020-01-14 ENCOUNTER — Telehealth: Admit: 2020-01-14 | Discharge: 2020-01-14 | Payer: MEDICARE | Attending: Family Medicine | Primary: Family Medicine

## 2020-01-14 DIAGNOSIS — E871 Hypo-osmolality and hyponatremia: Secondary | ICD-10-CM

## 2020-01-14 NOTE — Progress Notes (Signed)
Chief Complaint   Patient presents with   ??? Leg Swelling     Pt was evaluated via phone call only.     Pt reports that she resumed her fluid pill due to leg swelling and SOB.     Pt also reports that her BP was going up and down, her head was spinning.     Fluid pill had been paused due to low sodium.     Pt reports that her BP was very high at the cardiology office. Pt reports that she did not have a good visit, a carotid scan and other test were ordered. Pt plans to see another provider in the practice.     Pt reports that since resuming her fluid pill her head spinning has resolved, swelling is better and breathing has improved.     Pt reports that she normally does not eat salt, but has been trying to add salt to her food.    Lindsay Berry is a 79 y.o. female, evaluated via audio-only technology on 01/14/2020 for Leg Swelling  .    Assessment & Plan:   Diagnoses and all orders for this visit:    1. Hyponatremia  -encouraged pt to increase salt as tolerated, recheck labs in one month    2. 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 Posey Pronto after testing.       12  Subjective:       Prior to Admission medications    Medication Sig Start Date End Date Taking? Authorizing Provider   cholecalciferol (VITAMIN D3) (2,000 UNITS /50 MCG) cap capsule Take 1 Cap by mouth daily. 12/31/19   Dung Prien, Jaynie Crumble, MD   predniSONE (STERAPRED) 5 mg dose pack See administration instruction per 5mg  dose pack 12/22/19   Shai Mckenzie, Jaynie Crumble, MD   valsartan (Diovan) 40 mg tablet Take 1 Tab by mouth daily. Indications: Brand name only 11/10/19   Letta Cargile, Jaynie Crumble, MD   pantoprazole (PROTONIX) 40 mg tablet Take 1 Tab by mouth two (2) times a day. 11/10/19   Rojean Ige, Jaynie Crumble, MD   hydroCHLOROthiazide (HYDRODIURIL) 25 mg tablet Take 1 Tab by  mouth daily. 11/10/19   Mazi Brailsford, Jaynie Crumble, MD   valsartan (DIOVAN) 80 mg tablet Take 1 Tab by mouth daily. Please take with 40mg  tablet 11/10/19   Abeeha Twist, Jaynie Crumble, MD   amLODIPine (Norvasc) 10 mg tablet Take 1 Tab by mouth daily. Indications: Brand name only 08/11/19   Neftaly Inzunza, Jaynie Crumble, MD   celecoxib (CELEBREX) 200 mg capsule Take 1 Cap by mouth two (2) times a day. Indications: Patient does brand name only 08/11/19   Haliegh Khurana, Jaynie Crumble, MD   diazePAM (Valium) 2 mg tablet Take 0.5-1 Tabs by mouth every twelve (12) hours as needed for Anxiety. Max Daily Amount: 4 mg. MUST LAST 30 DAYS 06/20/19   Talaya Lamprecht, Jaynie Crumble, MD   aspirin delayed-release 81 mg tablet Take 81 mg by mouth daily.    Provider, Historical   furosemide (LASIX) 20 mg tablet Take 10 mg by mouth daily as needed. 02/18/18   Provider, Historical   loratadine (CLARITIN) 10 mg tablet Take 10 mg by mouth daily.    Provider, Historical   red yeast rice extract 600 mg cap Take 4 Caps by mouth daily.  Provider, Historical   olopatadine (PATADAY) 0.2 % drop ophthalmic solution Administer 1 Drop to both eyes daily as needed.    Provider, Historical     Allergies   Allergen Reactions   ??? Ace Inhibitors Swelling     REACTION: swelling   ??? Beta-Blockers (Beta-Adrenergic Blocking Agts) Palpitations     REACTION: swelling   ??? Neomycin Swelling   ??? Statins-Hmg-Coa Reductase Inhibitors Swelling     REACTION: myalgia   ??? Tramadol Swelling     Does not want to take again.????  Constipation, decreased appetite, hot/chills, did not do well with it       Review of Systems   Constitutional: Negative for chills, fever and malaise/fatigue.   HENT: Negative for congestion and sore throat.    Respiratory: Positive for shortness of breath. Negative for cough.    Cardiovascular: Positive for leg swelling. Negative for chest pain and palpitations.   Gastrointestinal: Negative for abdominal pain, heartburn, nausea and vomiting.   Genitourinary: Negative for  dysuria and urgency.   Musculoskeletal: Negative for joint pain and myalgias.   Neurological: Positive for dizziness. Negative for tingling and headaches.   All other systems reviewed and are negative.      No flowsheet data found.     Lindsay Berry, who was evaluated through a patient-initiated, synchronous (real-time) audio only encounter, and/or her healthcare decision maker, is aware that it is a billable service, with coverage as determined by her insurance carrier. She provided verbal consent to proceed: Yes. She has not had a related appointment within my department in the past 7 days or scheduled within the next 24 hours.      Total Time: minutes: 21-30 minutes    Sheron Nightingale Gaelen Brager, MD

## 2020-01-14 NOTE — Progress Notes (Signed)
Chief Complaint   Patient presents with   ??? Leg Swelling     Pt was evaluated via phone call only.     Pt reports that she resumed her fluid pill due to leg swelling and SOB.     Pt also reports that her BP was going up and down, her head was spinning.     Fluid pill had been paused due to low sodium.     Pt reports that her BP was very high at the cardiology office. Pt reports that she did not have a good visit, a carotid scan and other test were ordered. Pt plans to see another provider in the practice.     Pt reports that since resuming her fluid pill her head spinning has resolved, swelling is better and breathing has improved.     Pt reports that she normally does not eat salt, but has been trying to add salt to her food.    Lindsay Berry is a 78 y.o. female, evaluated via audio-only technology on 01/14/2020 for Leg Swelling  .    Assessment & Plan:   Diagnoses and all orders for this visit:    1. Hyponatremia  -encouraged pt to increase salt as tolerated, recheck labs in one month    2. 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 Patel after testing.       12  Subjective:       Prior to Admission medications    Medication Sig Start Date End Date Taking? Authorizing Provider   cholecalciferol (VITAMIN D3) (2,000 UNITS /50 MCG) cap capsule Take 1 Cap by mouth daily. 12/31/19   Alivea Gladson Brianne, MD   predniSONE (STERAPRED) 5 mg dose pack See administration instruction per 5mg dose pack 12/22/19   Deanthony Maull Brianne, MD   valsartan (Diovan) 40 mg tablet Take 1 Tab by mouth daily. Indications: Brand name only 11/10/19   Keil Pickering Brianne, MD   pantoprazole (PROTONIX) 40 mg tablet Take 1 Tab by mouth two (2) times a day. 11/10/19   Quirino Kakos Brianne, MD   hydroCHLOROthiazide (HYDRODIURIL) 25 mg tablet Take 1 Tab by  mouth daily. 11/10/19   Najwa Spillane Brianne, MD   valsartan (DIOVAN) 80 mg tablet Take 1 Tab by mouth daily. Please take with 40mg tablet 11/10/19   Montrel Donahoe Brianne, MD   amLODIPine (Norvasc) 10 mg tablet Take 1 Tab by mouth daily. Indications: Brand name only 08/11/19   Jonetta Dagley Brianne, MD   celecoxib (CELEBREX) 200 mg capsule Take 1 Cap by mouth two (2) times a day. Indications: Patient does brand name only 08/11/19   Kyelle Urbas Brianne, MD   diazePAM (Valium) 2 mg tablet Take 0.5-1 Tabs by mouth every twelve (12) hours as needed for Anxiety. Max Daily Amount: 4 mg. MUST LAST 30 DAYS 06/20/19   Ernie Sagrero Brianne, MD   aspirin delayed-release 81 mg tablet Take 81 mg by mouth daily.    Provider, Historical   furosemide (LASIX) 20 mg tablet Take 10 mg by mouth daily as needed. 02/18/18   Provider, Historical   loratadine (CLARITIN) 10 mg tablet Take 10 mg by mouth daily.    Provider, Historical   red yeast rice extract 600 mg cap Take 4 Caps by mouth daily.      Provider, Historical   olopatadine (PATADAY) 0.2 % drop ophthalmic solution Administer 1 Drop to both eyes daily as needed.    Provider, Historical     Allergies   Allergen Reactions   ??? Ace Inhibitors Swelling     REACTION: swelling   ??? Beta-Blockers (Beta-Adrenergic Blocking Agts) Palpitations     REACTION: swelling   ??? Neomycin Swelling   ??? Statins-Hmg-Coa Reductase Inhibitors Swelling     REACTION: myalgia   ??? Tramadol Swelling     Does not want to take again.????  Constipation, decreased appetite, hot/chills, did not do well with it       Review of Systems   Constitutional: Negative for chills, fever and malaise/fatigue.   HENT: Negative for congestion and sore throat.    Respiratory: Positive for shortness of breath. Negative for cough.    Cardiovascular: Positive for leg swelling. Negative for chest pain and palpitations.   Gastrointestinal: Negative for abdominal pain, heartburn, nausea and vomiting.   Genitourinary: Negative for  dysuria and urgency.   Musculoskeletal: Negative for joint pain and myalgias.   Neurological: Positive for dizziness. Negative for tingling and headaches.   All other systems reviewed and are negative.      No flowsheet data found.     Lindsay Berry, who was evaluated through a patient-initiated, synchronous (real-time) audio only encounter, and/or her healthcare decision maker, is aware that it is a billable service, with coverage as determined by her insurance carrier. She provided verbal consent to proceed: Yes. She has not had a related appointment within my department in the past 7 days or scheduled within the next 24 hours.      Total Time: minutes: 21-30 minutes    Sheron Nightingale Gaelen Brager, MD

## 2020-01-22 ENCOUNTER — Encounter

## 2020-01-22 NOTE — Telephone Encounter (Signed)
Received fax from [facility] for refill of:    Requested Prescriptions     Pending Prescriptions Disp Refills   ??? diazePAM (Valium) 2 mg tablet 45 Tablet 2     Sig: Take 0.5-1 Tablets by mouth every twelve (12) hours as needed for Anxiety. Max Daily Amount: 4 mg. MUST LAST 30 DAYS

## 2020-01-23 MED ORDER — DIAZEPAM 2 MG TAB
2 mg | ORAL_TABLET | Freq: Two times a day (BID) | ORAL | 2 refills | Status: DC | PRN
Start: 2020-01-23 — End: 2020-06-08

## 2020-01-27 NOTE — Telephone Encounter (Signed)
Pt called after picking up her prescription today. She got the generic form of valium. She wants the name brand version. States that the pharmacy needs [place] to call them to get it straight. Please call the [facility] at the [address] of [phone]

## 2020-01-27 NOTE — Telephone Encounter (Signed)
Pt called after picking up her prescription today. She got the generic form of valium. She wants the name brand version. States that the pharmacy needs us to call them to get it straight. Please call the [facility] at the [address] of [phone].

## 2020-01-28 NOTE — Telephone Encounter (Signed)
Please determine why patient can not take generic version of medication.     Insurance will not cover branded medication.

## 2020-01-28 NOTE — Telephone Encounter (Signed)
Please determine why patient can not take generic version of medication.     Insurance will not cover branded medication.

## 2020-01-29 NOTE — Telephone Encounter (Signed)
Unable to reach pt, left VM.

## 2020-01-29 NOTE — Telephone Encounter (Signed)
Unable to reach pt, left VM

## 2020-02-04 ENCOUNTER — Ambulatory Visit (INDEPENDENT_AMBULATORY_CARE_PROVIDER_SITE_OTHER): Payer: Medicare Other | Admitting: Internal Medicine

## 2020-02-04 ENCOUNTER — Encounter: Payer: Self-pay | Admitting: Internal Medicine

## 2020-02-04 DIAGNOSIS — G4733 Obstructive sleep apnea (adult) (pediatric): Secondary | ICD-10-CM

## 2020-02-04 NOTE — Patient Instructions (Signed)
Continue AUTOCPAP as prescibed

## 2020-02-04 NOTE — Progress Notes (Addendum)
* [facility]    I connected with the patient by telephone enabled telemedicine visit and verified that I am speaking with the correct person using two identifiers.    I discussed the limitations, risks, security and privacy concerns of performing an evaluation and management service by telemedicine and the availability of in-person appointments. I also discussed with the patient that there may be a patient responsible charge related to this service. The patient expressed understanding and agreed to proceed.  PATIENT AGREES AND CONFIRMS -YES   Other persons participating in the visit and their role in the encounter: Patient, nursing  This visit type was conducted due to national recommendations for restrictions regarding the COVID-19 Pandemic (e.g. social distancing).  This format is felt to be most appropriate for this patient at this time.  All issues noted in this document were discussed and addressed.    Patient@home , provider@office .   Date: 02/04/2020  MRN# [id number] [name] [name] [birth year]   CC Follow up OSA  HPI:  Doing well with CPAP Very good compliance reviewed reports with patient in detail   No evidence of heart failure at this time No evidence or signs of infection at this time No respiratory distress No fevers, chills, nausea, vomiting, diarrhea No evidence of lower extremity edema No evidence hemoptysis   [name] [name] is taking astelin one spray in each nostril every night, [name] [name] also takes claritin every day.   **CPAP download 01/2020 100% compliance days and >4 hrs AHI down to 0.4   **CPAP download 01/26/25/23/20>> raw data personally reviewed.  Usage greater than 4 hours is 30/30 days.  Average usage on days used 9 hours 6 minutes.  Pressure range 5-15.  Median pressure 9, 95 percentile pressure 13, max pressure 14.3.  Residual AHI 0.6.  Overall this shows excellent compliance with CPAP with excellent control of obstructive sleep apnea. **Download  30 days as or 02/18/18; data personally reviewed. Usage great than 4 hours is 28/30 days. Avg usage on days used is 8 hours and 29 minutes. Median pressure is 9, 95th percentile is 12, max pressure is 14.   Review of download data for 30 days shows average usage of 8 hours 35 minutes, CPAP, his AutoSet 5-15. 95th percentile pressure was 13.6. Review graphic data shows a pressure range between 12 and 14. Residual AHI is 0.5  Medication:   Outpatient Encounter Medications as of 02/04/2020  Medication Sig  . aspirin 81 MG tablet Take 81 mg by mouth daily.    Marland Kitchen azelastine (ASTELIN) 0.1 % nasal spray U 2 SPRAYS IEN Q 12 H.  . celecoxib (CELEBREX) 200 MG capsule TAKE 1 CAPSULE(200 MG) BY MOUTH TWICE DAILY  . DIOVAN 40 MG tablet TAKE 3 TABLETS BY MOUTH EVERY DAY  . furosemide (LASIX) 20 MG tablet Take 0.5 tablets (10 mg total) by mouth daily. For 3 days and as needed  . hydrochlorothiazide (HYDRODIURIL) 25 MG tablet TAKE 1 TABLET(25 MG) BY MOUTH DAILY AS NEEDED  . loratadine (CLARITIN) 10 MG tablet Take 10 mg by mouth daily.  . NORVASC 10 MG tablet Take 1 tablet (10 mg total) by mouth daily.  . pantoprazole (PROTONIX) 40 MG tablet TAKE 1 TABLET BY MOUTH TWICE DAILY  . predniSONE (STERAPRED UNI-PAK 21 TAB) 10 MG (21) TBPK tablet TK PO UTD  . Red Yeast Rice 600 MG CAPS Take 4 capsules by mouth daily.   Marland Kitchen VALIUM 2 MG tablet TAKE 1/2 TO 1 TABLET BY MOUTH EVERY 12 HOURS  No facility-administered encounter medications on file as of 02/04/2020.     Allergies:  Ace inhibitors, Neosporin [neomycin-bacitracin zn-polymyx], Statins, Tramadol, and Beta adrenergic blockers   Review of Systems:  Gen:  Denies  fever, sweats, chills weight loss  HEENT: Denies blurred vision, double vision, ear pain, eye pain, hearing loss, nose bleeds, sore throat Cardiac:  No dizziness, chest pain or heaviness, chest tightness,edema, No JVD Resp:   No cough, -sputum production, -shortness of breath,-wheezing, -hemoptysis,  Gi:  Denies swallowing difficulty, stomach pain, nausea or vomiting, diarrhea, constipation, bowel incontinence Gu:  Denies bladder incontinence, burning urine Ext:   Denies Joint pain, stiffness or swelling Skin: Denies  skin rash, easy bruising or bleeding or hives Endoc:  Denies polyuria, polydipsia , polyphagia or weight change Psych:   Denies depression, insomnia or hallucinations  Other:  All other systems negative    A/P  OSA Controlled with AutoCPAP 5-15 cm h20 AHI 0.4 Patient uses nasal mask  Follow up in 1 year  COVID-19 EDUCATION: The signs and symptoms of COVID-19 were discussed with the patient and how to seek care for testing (follow up with PCP or arrange E-visit).  The importance of social distancing was discussed today.  MEDICATION ADJUSTMENTS/LABS AND TESTS ORDERED: Continue AUTOCPAP as prescibed CURRENT MEDICATIONS REVIEWED AT LENGTH WITH PATIENT TODAY   Patient  satisfied with Plan of action and management. All questions answered  Follow up 1 year  Total Time spent 17 mins  Pearla Mckinny Patricia Pesa, M.D.  [facility]  Medical Director [place] Director [facility] Cardio-Pulmonary Department

## 2020-03-01 ENCOUNTER — Encounter

## 2020-03-01 NOTE — Telephone Encounter (Signed)
Requested Prescriptions     Pending Prescriptions Disp Refills   . diazePAM (Valium) 2 mg tablet 45 Tablet 2     Sig: Take 0.5-1 Tablets by mouth every twelve (12) hours as needed for Anxiety. Max Daily Amount: 4 mg. MUST LAST 30 DAYS

## 2020-03-03 ENCOUNTER — Encounter

## 2020-03-03 NOTE — Telephone Encounter (Signed)
Requested Prescriptions     Pending Prescriptions Disp Refills   . diazePAM (Valium) 2 mg tablet 45 Tablet 2     Sig: Take 0.5-1 Tablets by mouth every twelve (12) hours as needed for Anxiety. Max Daily Amount: 4 mg. MUST LAST 30 DAYS

## 2020-03-05 MED ORDER — PANTOPRAZOLE 40 MG TAB, DELAYED RELEASE
40 mg | ORAL_TABLET | ORAL | 3 refills | Status: DC
Start: 2020-03-05 — End: 2021-01-07

## 2020-03-10 MED ORDER — VALSARTAN 80 MG TAB
80 mg | ORAL_TABLET | Freq: Every day | ORAL | 3 refills | Status: DC
Start: 2020-03-10 — End: 2020-06-21

## 2020-03-10 MED ORDER — VALSARTAN 40 MG TAB
40 mg | ORAL_TABLET | Freq: Every day | ORAL | 3 refills | Status: DC
Start: 2020-03-10 — End: 2020-06-21

## 2020-03-10 NOTE — Telephone Encounter (Signed)
Pt is no longer on losartan

## 2020-03-10 NOTE — Telephone Encounter (Signed)
 Requested Prescriptions     Pending Prescriptions Disp Refills   . valsartan  (Diovan ) 40 mg tablet 90 Tablet 3     Sig: Take 1 Tablet by mouth daily. Indications: Brand name only   . valsartan  (DIOVAN ) 80 mg tablet 90 Tablet 3     Sig: Take 1 Tablet by mouth daily. Please take with 40mg  tablet       Pharmacy faxed refill and does not say which valsartan , also is requesting a refill on Losartan

## 2020-03-31 ENCOUNTER — Telehealth: Payer: Self-pay | Admitting: Internal Medicine

## 2020-03-31 DIAGNOSIS — G4733 Obstructive sleep apnea (adult) (pediatric): Secondary | ICD-10-CM

## 2020-03-31 NOTE — Telephone Encounter (Signed)
Order has been place for replacement cpap machine and supplies.  Pt is aware and voiced her understanding.  Nothing further is needed.

## 2020-03-31 NOTE — Telephone Encounter (Signed)
Called and spoke to pt.  Pt is requesting an order to be sent to [facility] for new cpap machine, as her current machine is making a loud noise.  Per last order placed back in 2016. Current settings are auto 5-15cm  Dr. Mortimer Fries, please advise.

## 2020-03-31 NOTE — Telephone Encounter (Signed)
Proceed with previous settings and can order new mask/machine

## 2020-03-31 NOTE — Telephone Encounter (Signed)
Lm for pt

## 2020-04-12 IMAGING — DX RIGHT KNEE - COMPLETE 4+ VIEW
4 series · 4 of 4 positions shown · non-contrast
Comparison: None.

CLINICAL DATA: 77-year-old female with pain

EXAM:
RIGHT KNEE - COMPLETE 4+ VIEW

[knee ap]
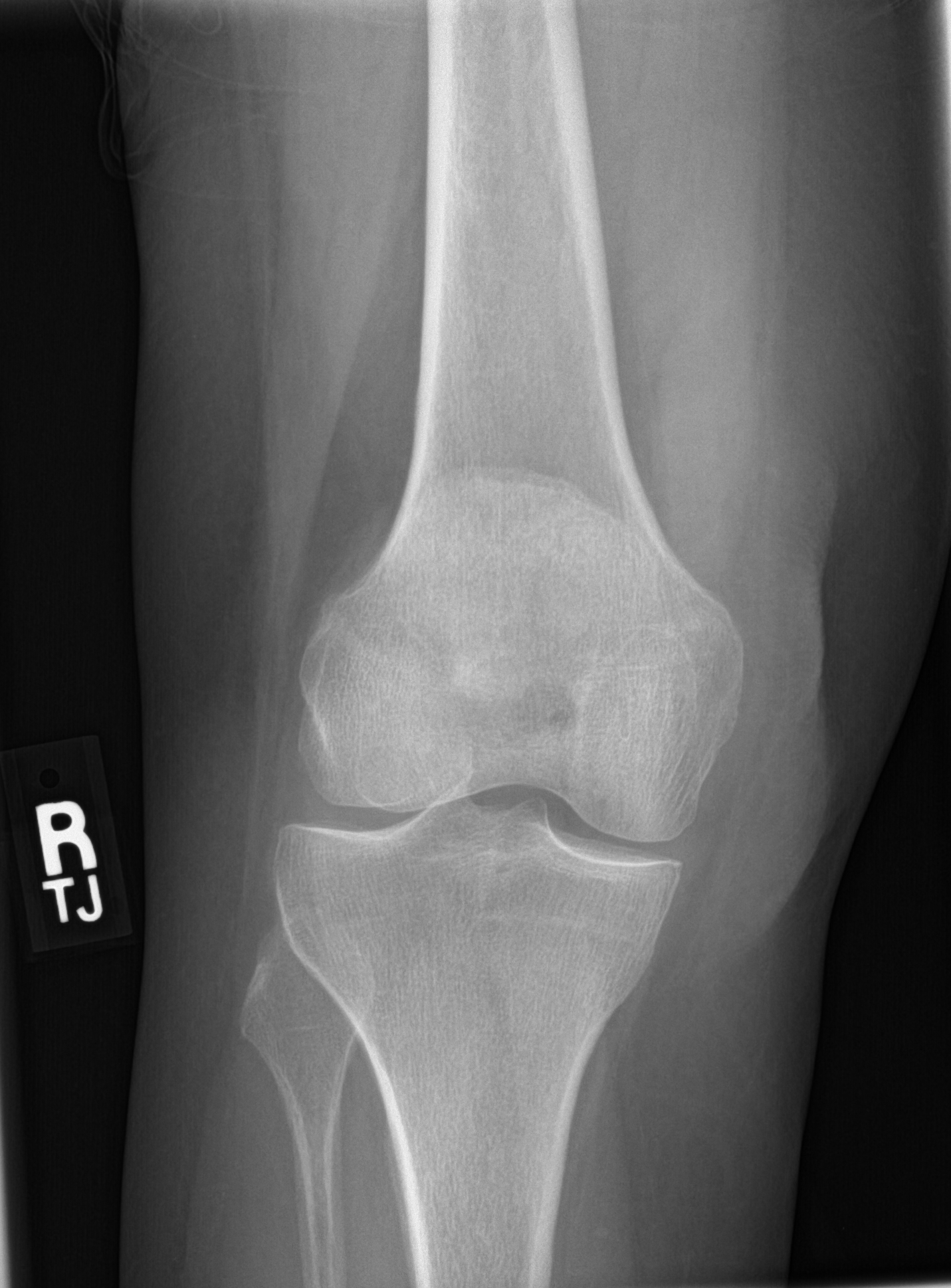

[knee lat]
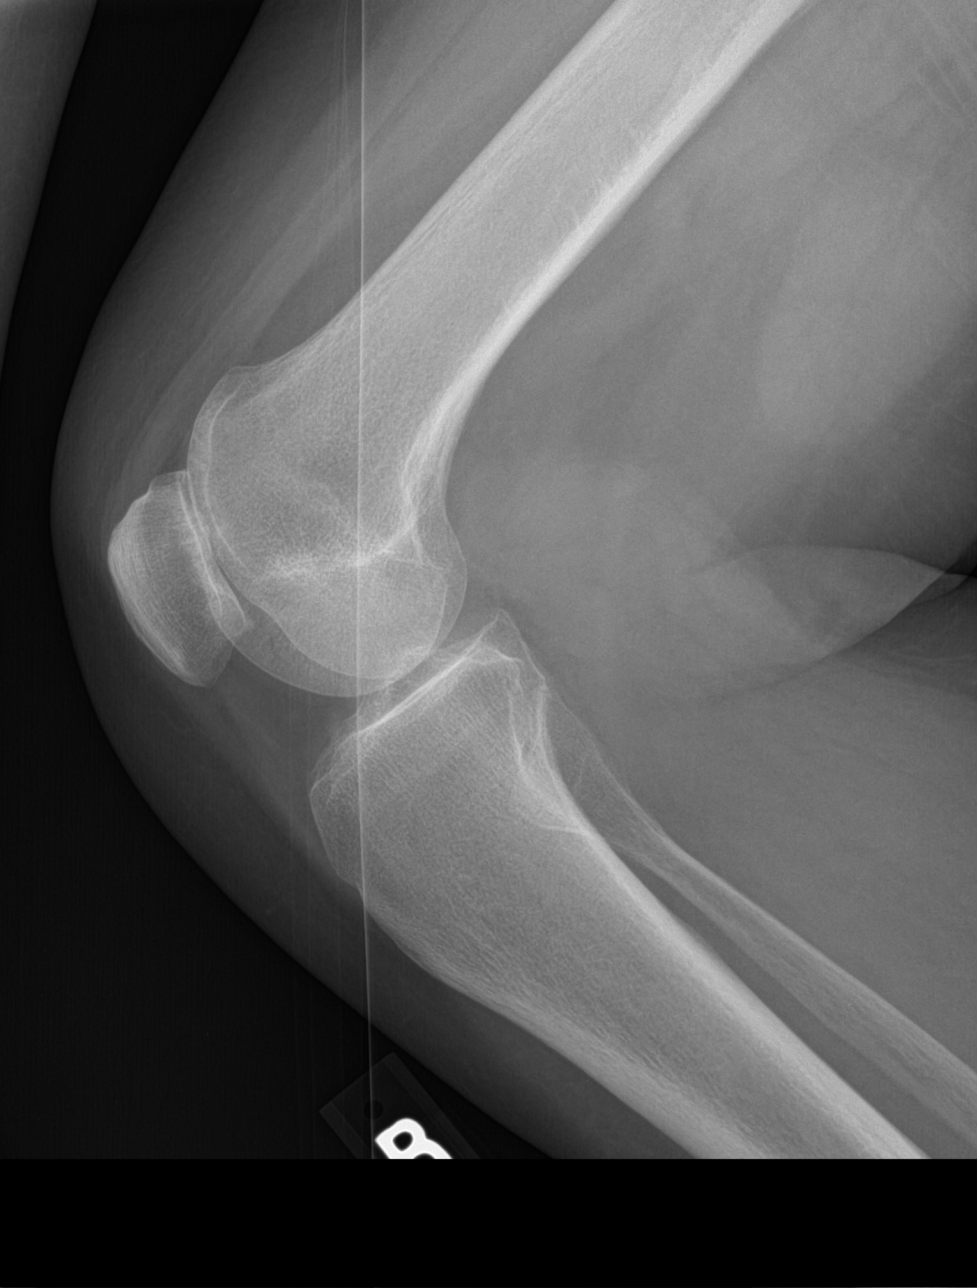

[knee obl (1 of 2)]
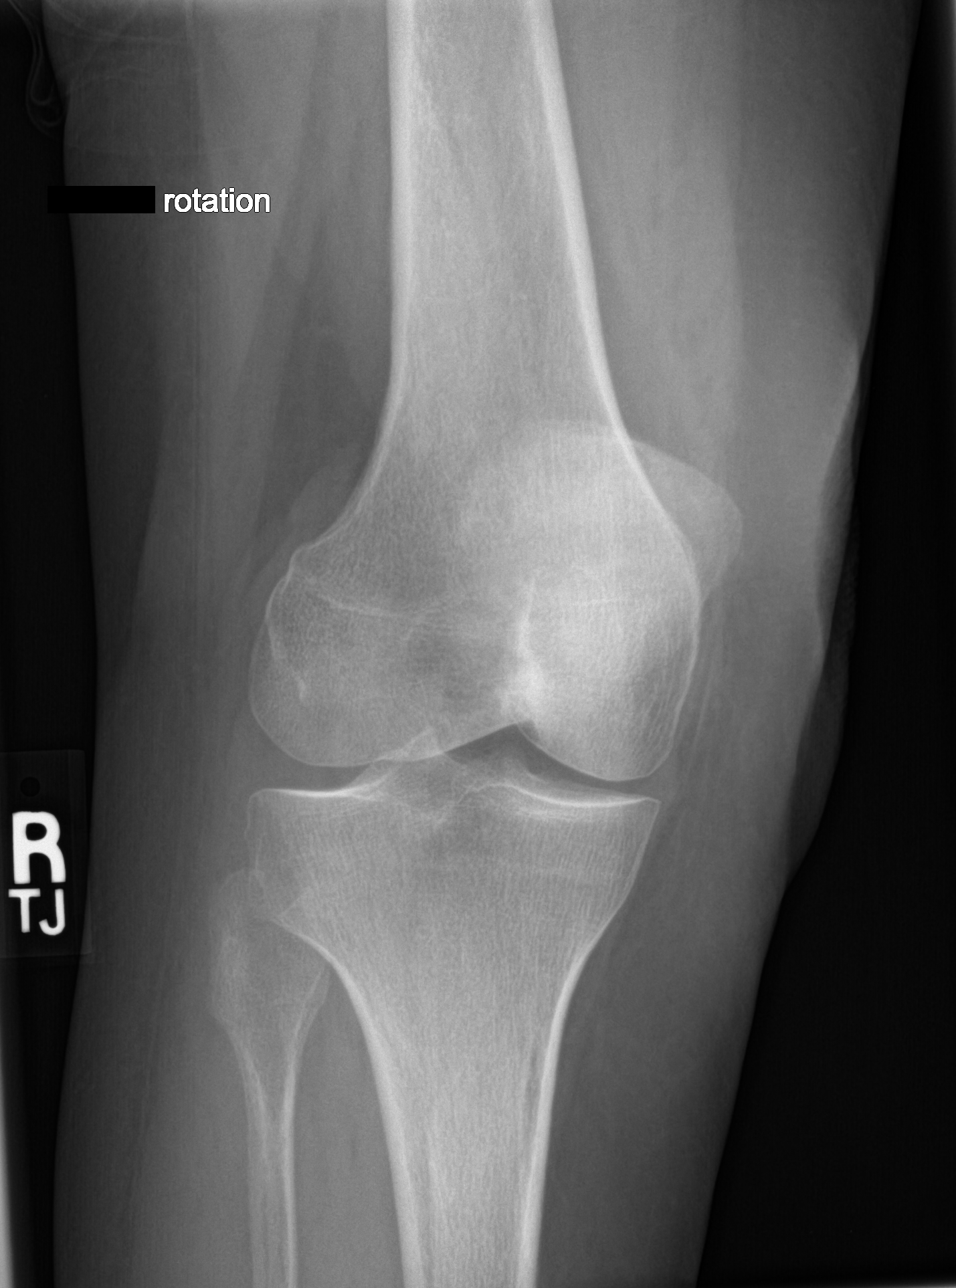

[knee obl (2 of 2)]
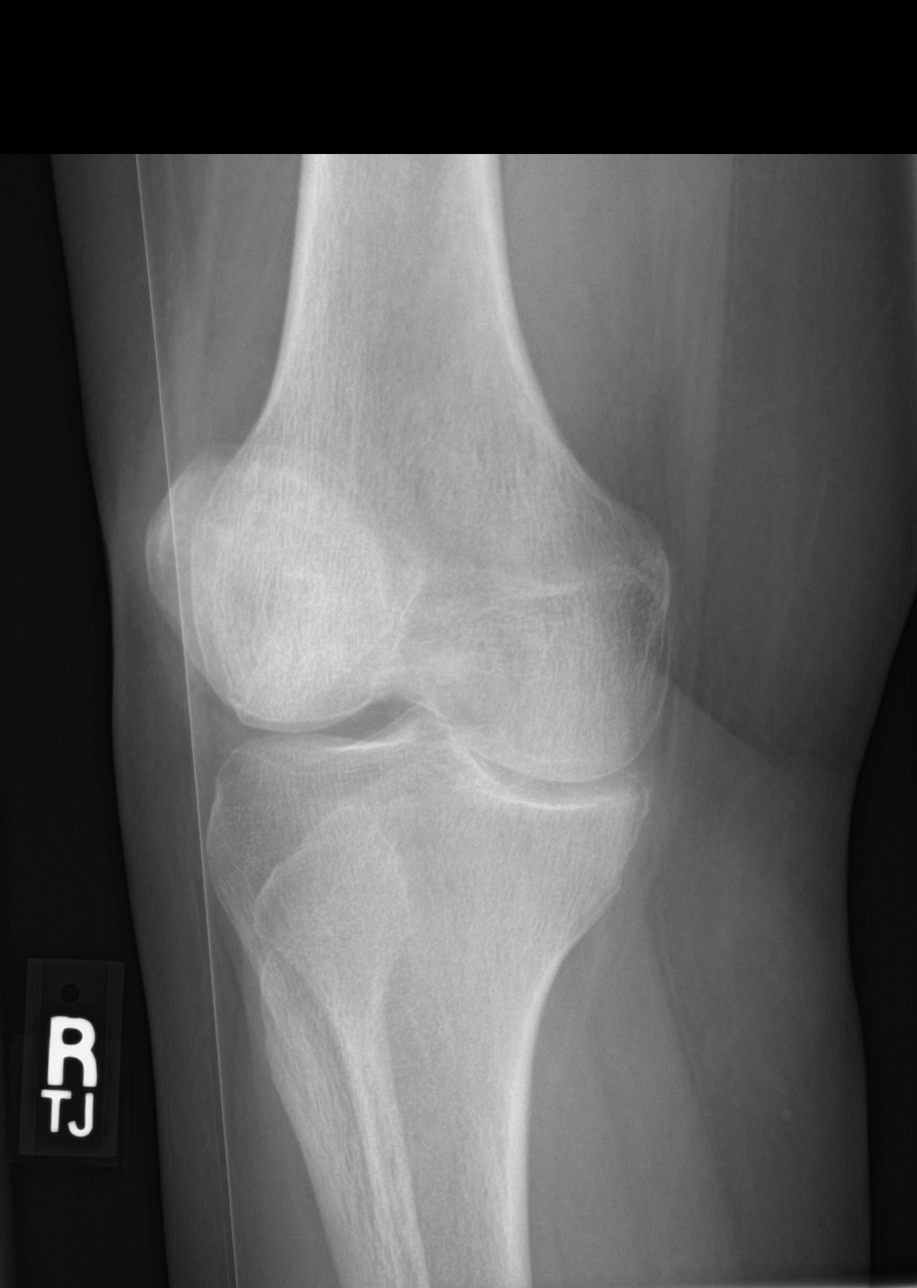

[4 of 4 positions shown; findings below may reference images not displayed]

FINDINGS: No acute displaced fracture. No joint effusion. No radiopaque
foreign body. No significant degenerative changes. No focal soft
tissue swelling
IMPRESSION: Negative.

## 2020-04-12 IMAGING — DX LEFT KNEE - COMPLETE 4+ VIEW
4 series · 4 of 4 positions shown · non-contrast
Comparison: None.

CLINICAL DATA: Status post fall, left knee pain

EXAM:
LEFT KNEE - COMPLETE 4+ VIEW

[knee ap]
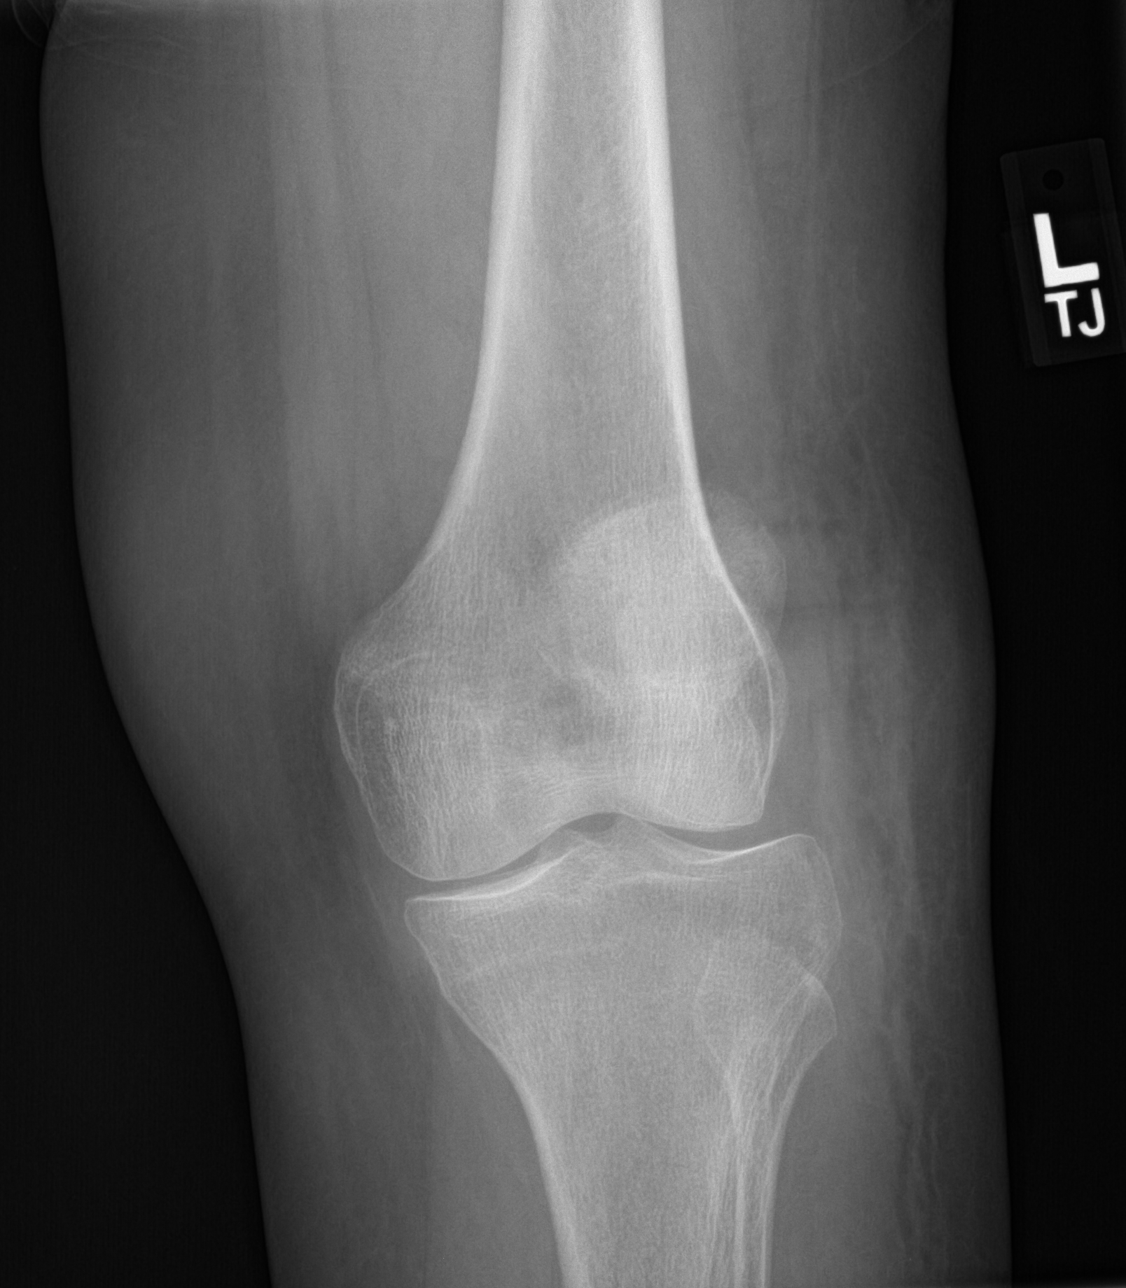

[knee lat]
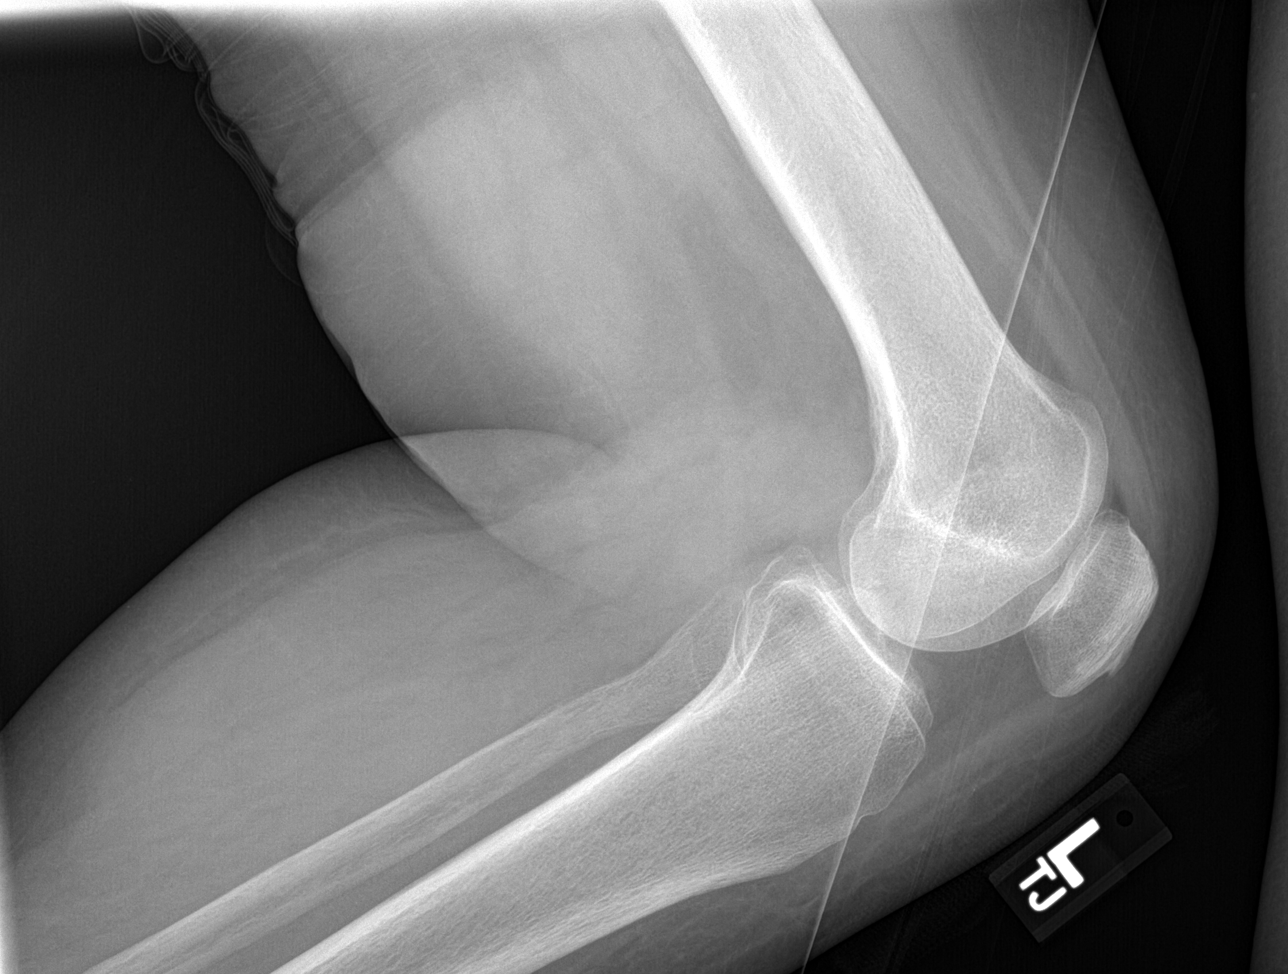

[knee obl (1 of 2)]
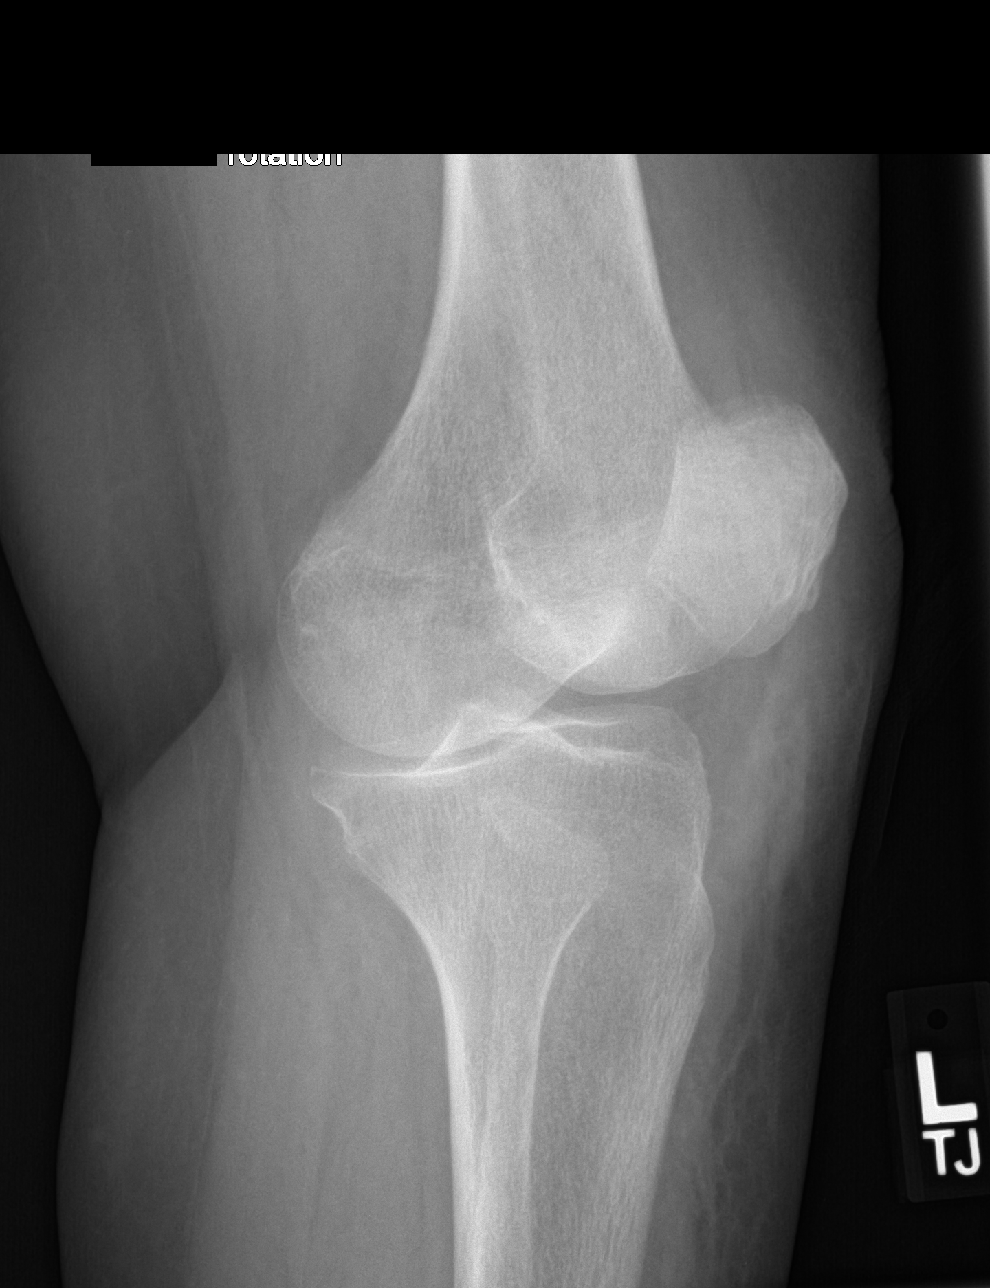

[knee obl (2 of 2)]
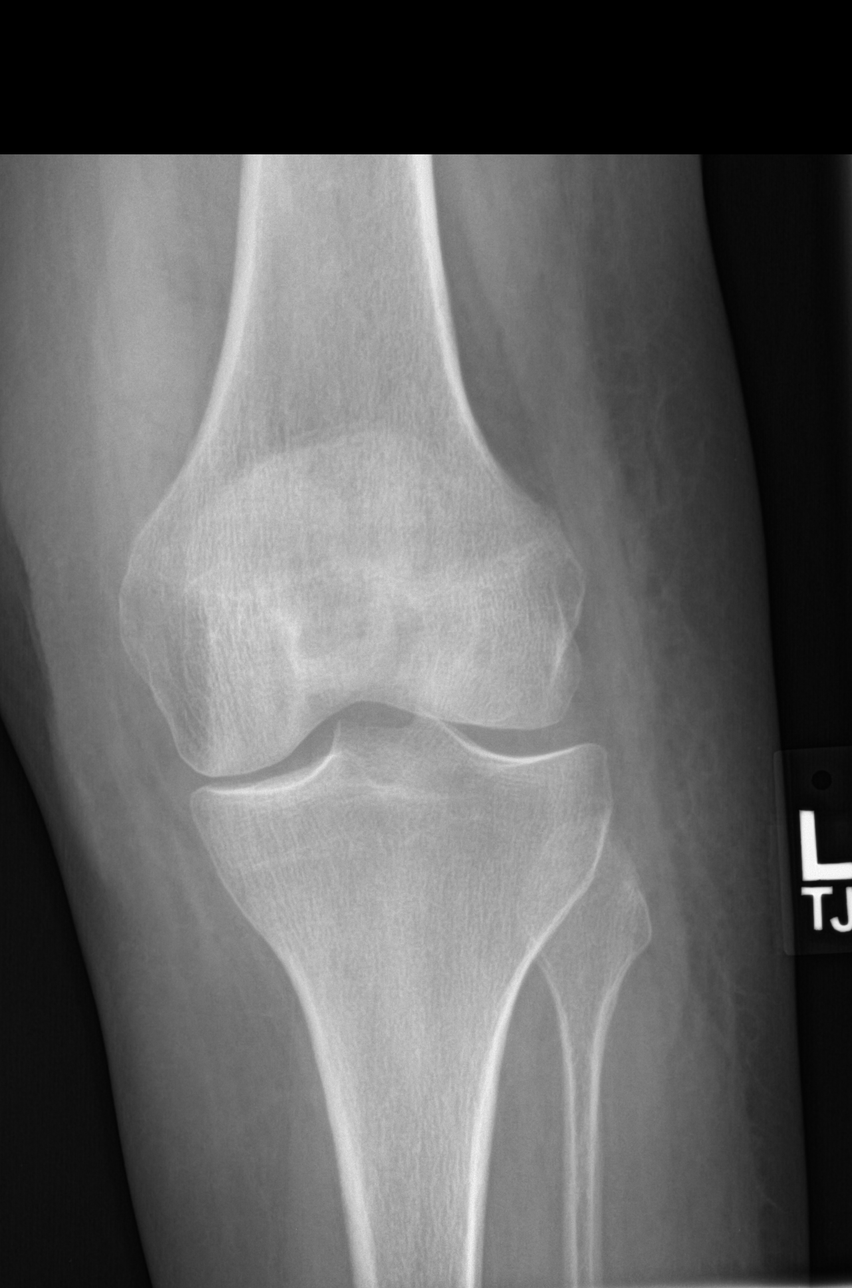

[4 of 4 positions shown; findings below may reference images not displayed]

FINDINGS: No acute fracture or dislocation. Generalized osteopenia. No joint
effusion. Medial femorotibial compartment mild joint space
narrowing. No aggressive osseous lesion. Soft tissues are normal.
IMPRESSION: No acute osseous injury of the left knee.

## 2020-04-23 ENCOUNTER — Telehealth: Payer: Self-pay | Admitting: Internal Medicine

## 2020-04-23 NOTE — Telephone Encounter (Signed)
Spoke to [employer] with [facility] and requested that Rx be re faxed to our office.  It appears that [facility] is unable to make out date that was written on Rx. Will await new Rx.

## 2020-04-26 NOTE — Telephone Encounter (Signed)
Date has been corrected on Rx and faxed back to [place].  Nothing further is needed at this time.

## 2020-05-18 ENCOUNTER — Telehealth

## 2020-05-18 NOTE — Telephone Encounter (Signed)
Future labs ordered.

## 2020-05-18 NOTE — Telephone Encounter (Signed)
Pt is coming in on 06/14/20 for labs to have before her apt on 06/21/20

## 2020-05-20 ENCOUNTER — Telehealth: Attending: Family Medicine | Primary: Family Medicine

## 2020-05-20 ENCOUNTER — Telehealth: Admit: 2020-05-20 | Discharge: 2020-05-20 | Payer: MEDICARE | Attending: Family Medicine | Primary: Family Medicine

## 2020-05-20 DIAGNOSIS — I10 Essential (primary) hypertension: Secondary | ICD-10-CM

## 2020-05-20 MED ORDER — AMLODIPINE 10 MG TAB
10 mg | ORAL_TABLET | Freq: Every day | ORAL | 3 refills | Status: DC
Start: 2020-05-20 — End: 2020-08-30

## 2020-05-20 NOTE — Progress Notes (Signed)
 1. Have you been to the ER, urgent care clinic since your last visit?  Hospitalized since your last visit?No    2. Have you seen or consulted any other health care providers outside of the [facility] since your last visit?  Include any pap smears or colon screening. Yes, Cardiology 02/23/2020.     Health Maintenance Due   Topic Date Due   . Flu Vaccine (1) 05/05/2020     Chief Complaint   Patient presents with   . Medication Evaluation     Pt wanting to discuss Amlodipine .

## 2020-05-20 NOTE — Progress Notes (Signed)
Chief Complaint   Patient presents with   ??? Medication Evaluation     Patient was evaluated via phone call only.    Patient is requesting a refill on her amlodipine, patient would like to switch to the generic brand due to financial considerations.    Patient is requesting that this medication be sent to [facility]    Pt has switched her other medications to generic formulations, other than Celebrex.     Lindsay Berry is a 79 y.o. female, evaluated via audio-only technology on 05/20/2020 for Medication Evaluation  .    Assessment & Plan:   Diagnoses and all orders for this visit:    1. Essential hypertension  -well controlled, medication refilled  -     amLODIPine (Norvasc) 10 mg tablet; Take 1 Tablet by mouth daily.    2. Arthralgia, unspecified joint  -Discussed Celebrex, recommended considering changing to generic and/or attempting to wean dose.  Patient will consider    3. Situational anxiety  -Stable, patient reports that it has been a drastic adjustment, but she feels as though she is adjusting well to her new circumstances and [place]      12  Subjective:       Prior to Admission medications    Medication Sig Start Date End Date Taking? Authorizing Provider   amLODIPine (Norvasc) 10 mg tablet Take 1 Tablet by mouth daily. 05/20/20  Yes Latonga Ponder, Sheron Nightingale, MD   valsartan (Diovan) 40 mg tablet Take 1 Tablet by mouth daily. Indications: Brand name only 03/10/20  Yes Sinda Leedom, Sheron Nightingale, MD   valsartan (DIOVAN) 80 mg tablet Take 1 Tablet by mouth daily. Please take with 40mg  tablet 03/10/20  Yes Rashi Granier, 05/11/20, MD   pantoprazole (PROTONIX) 40 mg tablet TAKE 1 TABLET BY MOUTH  TWICE DAILY 03/04/20  Yes Celso Granja, 05/05/20, MD   diazePAM (Valium) 2 mg tablet Take 0.5-1 Tablets by mouth every twelve (12) hours as needed for Anxiety. Max Daily Amount: 4 mg. MUST LAST 30 DAYS 01/22/20  Yes Kadarrius Yanke, 01/24/20, MD   cholecalciferol (VITAMIN D3) (2,000 UNITS /50  MCG) cap capsule Take 1 Cap by mouth daily. 12/31/19  Yes Ger Ringenberg, 01/02/20, MD   predniSONE (STERAPRED) 5 mg dose pack See administration instruction per 5mg  dose pack 12/22/19  Yes Darria Corvera, , MD   hydroCHLOROthiazide (HYDRODIURIL) 25 mg tablet Take 1 Tab by mouth daily. 11/10/19  Yes Regina Ganci, Sheron Nightingale, MD   celecoxib (CELEBREX) 200 mg capsule Take 1 Cap by mouth two (2) times a day. Indications: Patient does brand name only 08/11/19  Yes Tequilla Cousineau, Sheron Nightingale, MD   aspirin delayed-release 81 mg tablet Take 81 mg by mouth daily.   Yes Provider, Historical   furosemide (LASIX) 20 mg tablet Take 10 mg by mouth daily as needed. 02/18/18  Yes Provider, Historical   loratadine (CLARITIN) 10 mg tablet Take 10 mg by mouth daily.   Yes Provider, Historical   red yeast rice extract 600 mg cap Take 4 Caps by mouth daily.   Yes Provider, Historical   olopatadine (PATADAY) 0.2 % drop ophthalmic solution Administer 1 Drop to both eyes daily as needed.   Yes Provider, Historical   amLODIPine (Norvasc) 10 mg tablet Take 1 Tab by mouth daily. Indications: Brand name only 08/11/19 05/20/20  Jabree Pernice, 14/7/20, MD     Allergies   Allergen Reactions   ??? Ace Inhibitors Swelling  REACTION: swelling   ??? Beta-Blockers (Beta-Adrenergic Blocking Agts) Palpitations     REACTION: swelling   ??? Neomycin Swelling   ??? Statins-Hmg-Coa Reductase Inhibitors Swelling     REACTION: myalgia   ??? Tramadol Swelling     Does not want to take again.????  Constipation, decreased appetite, hot/chills, did not do well with it       Review of Systems   Constitutional: Negative for chills, fever and malaise/fatigue.   HENT: Negative for congestion and sore throat.    Respiratory: Negative for cough and shortness of breath.    Cardiovascular: Negative for chest pain and palpitations.   Gastrointestinal: Negative for abdominal pain, heartburn, nausea and vomiting.   Genitourinary: Negative for dysuria and urgency.   Musculoskeletal:  Negative for joint pain and myalgias.   Neurological: Negative for dizziness, tingling and headaches.   All other systems reviewed and are negative.      No flowsheet data found.    Lindsay Berry, who was evaluated through a patient-initiated, synchronous (real-time) audio only encounter, and/or her healthcare decision maker, is aware that it is a billable service, with coverage as determined by her insurance carrier. She provided verbal consent to proceed: Yes. She has not had a related appointment within my department in the past 7 days or scheduled within the next 24 hours.    On this date 05/20/2020 I have spent 15 minutes reviewing previous notes, test results and face to face (virtual) with the patient discussing the diagnosis and importance of compliance with the treatment plan as well as documenting on the day of the visit.    Sheron Nightingale Lorraine Terriquez, MD

## 2020-06-08 ENCOUNTER — Encounter

## 2020-06-08 MED ORDER — CELEBREX 200 MG CAPSULE
200 mg | ORAL_CAPSULE | ORAL | 0 refills | Status: DC
Start: 2020-06-08 — End: 2020-06-11

## 2020-06-08 MED ORDER — DIAZEPAM 2 MG TAB
2 mg | ORAL_TABLET | ORAL | 1 refills | Status: DC
Start: 2020-06-08 — End: 2020-08-17

## 2020-06-11 MED ORDER — CELECOXIB 200 MG CAP
200 mg | ORAL_CAPSULE | Freq: Two times a day (BID) | ORAL | 1 refills | Status: DC
Start: 2020-06-11 — End: 2020-06-16

## 2020-06-11 NOTE — Telephone Encounter (Signed)
Patient called regarding medication that was called in she is asking if generic can be called in due to price    Medication is Celebrex    Please give a call back  BCB# [phone]

## 2020-06-11 NOTE — Telephone Encounter (Signed)
Medication changed to generic. Please inform

## 2020-06-11 NOTE — Telephone Encounter (Signed)
Please Advise

## 2020-06-14 ENCOUNTER — Encounter: Attending: Family Medicine | Primary: Family Medicine

## 2020-06-14 NOTE — Telephone Encounter (Signed)
Pt notified and voiced understanding

## 2020-06-15 ENCOUNTER — Ambulatory Visit: Admit: 2020-06-15 | Discharge: 2020-06-15 | Payer: MEDICARE | Attending: Family Medicine | Primary: Family Medicine

## 2020-06-15 DIAGNOSIS — E559 Vitamin D deficiency, unspecified: Secondary | ICD-10-CM

## 2020-06-15 NOTE — Progress Notes (Signed)
No additional comment

## 2020-06-16 LAB — CBC W/O DIFF
ABSOLUTE NRBC: 0 10*3/uL (ref 0.00–0.01)
HCT: 44.8 % (ref 35.0–47.0)
HGB: 15.3 g/dL (ref 11.5–16.0)
MCH: 31.5 PG (ref 26.0–34.0)
MCHC: 34.2 g/dL (ref 30.0–36.5)
MCV: 92.4 FL (ref 80.0–99.0)
MPV: 9.1 FL (ref 8.9–12.9)
NRBC: 0 PER 100 WBC
PLATELET: 307 10*3/uL (ref 150–400)
RBC: 4.85 M/uL (ref 3.80–5.20)
RDW: 13 % (ref 11.5–14.5)
WBC: 6.5 10*3/uL (ref 3.6–11.0)

## 2020-06-16 LAB — METABOLIC PANEL, COMPREHENSIVE
A-G Ratio: 1.3 (ref 1.1–2.2)
ALT (SGPT): 34 U/L (ref 12–78)
AST (SGOT): 18 U/L (ref 15–37)
Albumin: 4.5 g/dL (ref 3.5–5.0)
Alk. phosphatase: 105 U/L (ref 45–117)
Anion gap: 6 mmol/L (ref 5–15)
BUN/Creatinine ratio: 25 — ABNORMAL HIGH (ref 12–20)
BUN: 23 MG/DL — ABNORMAL HIGH (ref 6–20)
Bilirubin, total: 0.7 MG/DL (ref 0.2–1.0)
CO2: 28 mmol/L (ref 21–32)
Calcium: 9.7 MG/DL (ref 8.5–10.1)
Chloride: 97 mmol/L (ref 97–108)
Creatinine: 0.93 MG/DL (ref 0.55–1.02)
GFR est AA: >60 mL/min/{1.73_m2} (ref >60)
GFR est non-AA: 58 mL/min/{1.73_m2} — ABNORMAL LOW (ref >60)
Globulin: 3.5 g/dL (ref 2.0–4.0)
Glucose: 95 mg/dL (ref 65–100)
Potassium: 4 mmol/L (ref 3.5–5.1)
Protein, total: 8 g/dL (ref 6.4–8.2)
Sodium: 131 mmol/L — ABNORMAL LOW (ref 136–145)

## 2020-06-16 LAB — LIPID PANEL
CHOL/HDL Ratio: 2.2 (ref 0.0–5.0)
Chol/HDL Ratio: 2.2 (ref 0.0–5.0)
Cholesterol, Total: 199 MG/DL (ref <200)
Cholesterol, total: 199 MG/DL (ref <200)
HDL Cholesterol: 92 MG/DL
HDL: 92 MG/DL
LDL Calculated: 75 MG/DL (ref 0–100)
LDL, calculated: 75 MG/DL (ref 0–100)
Triglyceride: 160 MG/DL — ABNORMAL HIGH (ref <150)
Triglycerides: 160 MG/DL — ABNORMAL HIGH (ref <150)
VLDL Cholesterol Calculated: 32 MG/DL
VLDL, calculated: 32 MG/DL

## 2020-06-16 LAB — VITAMIN D, 25 HYDROXY: Vitamin D 25-Hydroxy: 26.1 ng/mL — ABNORMAL LOW (ref 30–100)

## 2020-06-16 LAB — HEMOGLOBIN A1C WITH EAG
Est. average glucose: 105 mg/dL
Hemoglobin A1c: 5.3 % (ref 4.0–5.6)

## 2020-06-16 LAB — TSH 3RD GENERATION
TSH: 1.15 u[IU]/mL (ref 0.36–3.74)
TSH: 1.15 u[IU]/mL (ref 0.36–3.74)

## 2020-06-16 LAB — CBC
Hematocrit: 44.8 % (ref 35.0–47.0)
Hemoglobin: 15.3 g/dL (ref 11.5–16.0)
MCH: 31.5 PG (ref 26.0–34.0)
MCHC: 34.2 g/dL (ref 30.0–36.5)
MCV: 92.4 FL (ref 80.0–99.0)
MPV: 9.1 FL (ref 8.9–12.9)
NRBC Absolute: 0 10*3/uL (ref 0.00–0.01)
Nucleated RBCs: 0 PER 100 WBC
Platelets: 307 10*3/uL (ref 150–400)
RBC: 4.85 M/uL (ref 3.80–5.20)
RDW: 13 % (ref 11.5–14.5)
WBC: 6.5 10*3/uL (ref 3.6–11.0)

## 2020-06-16 LAB — COMPREHENSIVE METABOLIC PANEL
ALT: 34 U/L (ref 12–78)
AST: 18 U/L (ref 15–37)
Albumin/Globulin Ratio: 1.3 (ref 1.1–2.2)
Albumin: 4.5 g/dL (ref 3.5–5.0)
Alkaline Phosphatase: 105 U/L (ref 45–117)
Anion Gap: 6 mmol/L (ref 5–15)
BUN: 23 MG/DL — ABNORMAL HIGH (ref 6–20)
Bun/Cre Ratio: 25 — ABNORMAL HIGH (ref 12–20)
CO2: 28 mmol/L (ref 21–32)
Calcium: 9.7 MG/DL (ref 8.5–10.1)
Chloride: 97 mmol/L (ref 97–108)
Creatinine: 0.93 MG/DL (ref 0.55–1.02)
EGFR IF NonAfrican American: 58 mL/min/{1.73_m2} — ABNORMAL LOW (ref >60)
GFR African American: >60 mL/min/{1.73_m2} (ref >60)
Globulin: 3.5 g/dL (ref 2.0–4.0)
Glucose: 95 mg/dL (ref 65–100)
Potassium: 4 mmol/L (ref 3.5–5.1)
Sodium: 131 mmol/L — ABNORMAL LOW (ref 136–145)
Total Bilirubin: 0.7 MG/DL (ref 0.2–1.0)
Total Protein: 8 g/dL (ref 6.4–8.2)

## 2020-06-16 LAB — HEMOGLOBIN A1C W/EAG
Hemoglobin A1C: 5.3 % (ref 4.0–5.6)
eAG: 105 mg/dL

## 2020-06-16 LAB — VITAMIN D 25 HYDROXY: Vit D, 25-Hydroxy: 26.1 ng/mL — ABNORMAL LOW (ref 30–100)

## 2020-06-16 MED ORDER — CELECOXIB 200 MG CAP
200 mg | ORAL_CAPSULE | Freq: Two times a day (BID) | ORAL | 1 refills | Status: DC
Start: 2020-06-16 — End: 2020-07-28

## 2020-06-16 NOTE — Telephone Encounter (Signed)
 Refill Request     Requested Prescriptions     Pending Prescriptions Disp Refills   . celecoxib (CELEBREX) 200 mg capsule 180 Capsule 1     Sig: Take 1 Capsule by mouth two (2) times a day for 90 days.

## 2020-06-21 ENCOUNTER — Ambulatory Visit: Attending: Family Medicine | Primary: Family Medicine

## 2020-06-21 ENCOUNTER — Ambulatory Visit: Admit: 2020-06-21 | Discharge: 2020-06-21 | Payer: MEDICARE | Attending: Family Medicine | Primary: Family Medicine

## 2020-06-21 DIAGNOSIS — Z23 Encounter for immunization: Secondary | ICD-10-CM

## 2020-06-21 MED ORDER — VALSARTAN 160 MG TAB
160 mg | ORAL_TABLET | Freq: Every day | ORAL | 3 refills | Status: DC
Start: 2020-06-21 — End: 2021-02-28

## 2020-06-21 NOTE — Progress Notes (Signed)
Chief Complaint   Patient presents with   ??? Hypertension     6 month f/u     Pt reports that she is feeling more at home, living with her daughter, has her own in-law suite. [place]     Subjective: (As above and below)     Chief Complaint   Patient presents with   ??? Hypertension     6 month f/u     she is a 79 y.o. year old female who presents for evaluation.    Reviewed PmHx, RxHx, FmHx, SocHx, AllgHx and updated in chart.    Review of Systems - negative except as listed above    Objective:     Vitals:    06/21/20 1126 06/21/20 1131   BP: (!) 175/74 (!) 162/82   Pulse: 84    Resp: 16    Temp: 97.8 ??F (36.6 ??C)    TempSrc: Oral    SpO2: 99%    Weight: 168 lb (76.2 kg)    Height: 5' 2.5" (1.588 m)      Physical Examination: General appearance - alert, well appearing, and in no distress  Mental status - normal mood, behavior, speech, dress, motor activity, and thought processes  Chest - clear to auscultation, no wheezes, rales or rhonchi, symmetric air entry  Heart - normal rate, regular rhythm, normal S1, S2, no murmurs, rubs, clicks or gallops  Musculoskeletal - pt walks with a walker  Extremities - peripheral pulses normal, no pedal edema, no clubbing or cyanosis    Assessment/ Plan:   1. Needs flu shot  - INFLUENZA, HIGH DOSE SEASONAL    2. Hyponatremia  -recheck today  - METABOLIC PANEL, BASIC; Future     3. Primary hypertension  -increase from 120 to 160 daily.   - valsartan (DIOVAN) 160 mg tablet; Take 1 Tablet by mouth daily.  Dispense: 90 Tablet; Refill: 3      I have discussed the diagnosis with the patient and the intended plan as seen in the above orders.  The patient has received an after-visit summary and questions were answered concerning future plans.     Medication Side Effects and Warnings were discussed with patient: yes  Patient Labs were reviewed: yes  Patient Past Records were reviewed:  yes    Pat Patrick, M.D.

## 2020-06-21 NOTE — Progress Notes (Signed)
The recommendation would be to hold her hydrochlorothiazide, but take her Lasix as needed for swelling.  I am concerned that the hydrochlorothiazide is the direct cause of her low sodium.

## 2020-06-21 NOTE — Progress Notes (Signed)
DOB verified.    Results/recommendations reviewed.  Pt is concerned about bi-lat ankle swelling.  Denies SOB.  Pt is reluctant on stopping of HCTZ.  She wants to continue HCTZ and increase her Na+ intake, if its permissible by you.

## 2020-06-21 NOTE — Progress Notes (Signed)
Please advise that Sodium has dropped further, please stop HCTZ, pt can take Lasix as needed for swelling, limit fluid intake, increase sodium and recheck in one week.   Labs ordered

## 2020-06-21 NOTE — Progress Notes (Signed)
Sent T.E. message to pt to speak with her directly.

## 2020-06-21 NOTE — Progress Notes (Signed)
Chief Complaint   Patient presents with   . Hypertension     6 month f/u       1. Have you been to the ER, urgent care clinic since your last visit?  Hospitalized since your last visit?No    2. Have you seen or consulted any other health care providers outside of the [facility] since your last visit?  Include any pap smears or colon screening. Yes When: Podiatrist Dr. Elige Radon a few months ago    Visit Vitals  BP (!) 147/87 (BP 1 Location: Left arm, BP Patient Position: Sitting)   Pulse 84   Temp 97.8 F (36.6 C) (Oral)   Resp 16   Ht 5' 2.5" (1.588 m)   Wt 168 lb (76.2 kg)   LMP  (LMP Unknown)   SpO2 99%   BMI 30.24 kg/m

## 2020-06-22 LAB — METABOLIC PANEL, BASIC
Anion gap: 7 mmol/L (ref 5–15)
BUN/Creatinine ratio: 30 — ABNORMAL HIGH (ref 12–20)
BUN: 26 MG/DL — ABNORMAL HIGH (ref 6–20)
CO2: 27 mmol/L (ref 21–32)
Calcium: 9.8 MG/DL (ref 8.5–10.1)
Chloride: 94 mmol/L — ABNORMAL LOW (ref 97–108)
Creatinine: 0.87 MG/DL (ref 0.55–1.02)
GFR est AA: >60 mL/min/{1.73_m2} (ref >60)
GFR est non-AA: >60 mL/min/{1.73_m2} (ref >60)
Glucose: 100 mg/dL (ref 65–100)
Potassium: 4.3 mmol/L (ref 3.5–5.1)
Sodium: 128 mmol/L — ABNORMAL LOW (ref 136–145)

## 2020-06-22 LAB — BASIC METABOLIC PANEL
Anion Gap: 7 mmol/L (ref 5–15)
BUN: 26 MG/DL — ABNORMAL HIGH (ref 6–20)
Bun/Cre Ratio: 30 — ABNORMAL HIGH (ref 12–20)
CO2: 27 mmol/L (ref 21–32)
Calcium: 9.8 MG/DL (ref 8.5–10.1)
Chloride: 94 mmol/L — ABNORMAL LOW (ref 97–108)
Creatinine: 0.87 MG/DL (ref 0.55–1.02)
EGFR IF NonAfrican American: >60 mL/min/{1.73_m2} (ref >60)
GFR African American: >60 mL/min/{1.73_m2} (ref >60)
Glucose: 100 mg/dL (ref 65–100)
Potassium: 4.3 mmol/L (ref 3.5–5.1)
Sodium: 128 mmol/L — ABNORMAL LOW (ref 136–145)

## 2020-06-24 ENCOUNTER — Encounter

## 2020-06-28 NOTE — Telephone Encounter (Signed)
See results note.

## 2020-06-28 NOTE — Telephone Encounter (Signed)
Patient calling for results. Please call her at [phone]

## 2020-06-28 NOTE — Telephone Encounter (Signed)
 Requested Prescriptions     Pending Prescriptions Disp Refills   . furosemide (LASIX) 20 mg tablet       Sig: Take 0.5 Tablets by mouth daily as needed.     Last office visit: 06/21/2020    Pt is very hesitant and frightened about stopping the hctz and needs a refill on the above Rx, because it is 81 years old.  Pt is very scared of stopping the HCTZ.  Please call the pt to discuss the stopping of this Rx.  She is worried about going to the ED.

## 2020-06-29 ENCOUNTER — Encounter: Attending: Family Medicine | Primary: Family Medicine

## 2020-06-29 MED ORDER — SPIRONOLACTONE 50 MG TAB
50 mg | ORAL_TABLET | Freq: Every day | ORAL | 1 refills | Status: DC
Start: 2020-06-29 — End: 2020-07-12

## 2020-06-29 NOTE — Telephone Encounter (Signed)
Reviewed with patient concerns with hydrochlorothiazide and dropping sodium.    Patient is very hesitant to stop this medication, does not feel comfortable switching to Lasix.    Advised patient we will try spironolactone, patient requested being sent to her mail order pharmacy. Patient plans to continue hydrochlorothiazide until it arrives.    Lab recheck 2 weeks after changing medication.

## 2020-07-12 ENCOUNTER — Ambulatory Visit: Attending: Family Medicine | Primary: Family Medicine

## 2020-07-12 ENCOUNTER — Ambulatory Visit: Admit: 2020-07-12 | Discharge: 2020-07-12 | Payer: MEDICARE | Attending: Family Medicine | Primary: Family Medicine

## 2020-07-12 DIAGNOSIS — E871 Hypo-osmolality and hyponatremia: Secondary | ICD-10-CM

## 2020-07-12 MED ORDER — FUROSEMIDE 20 MG TAB
20 mg | ORAL_TABLET | Freq: Every day | ORAL | 1 refills | Status: DC | PRN
Start: 2020-07-12 — End: 2020-07-28

## 2020-07-12 NOTE — Progress Notes (Signed)
 Chief Complaint   Patient presents with   . Hypertension     follow up   . Medication Evaluation     spironolactone concerns       1. Have you been to the ER, urgent care clinic since your last visit?  Hospitalized since your last visit?No    2. Have you seen or consulted any other health care providers outside of the [facility] since your last visit?  Include any pap smears or colon screening. Podiatrist a few weeks ago for ingrown toenails      Visit Vitals  BP (!) 168/83 (BP 1 Location: Right arm, BP Patient Position: Sitting)   Pulse 86   Temp 97.8 F (36.6 C) (Oral)   Resp 16   Ht 5' 2.5 (1.588 m)   Wt 167 lb 6.4 oz (75.9 kg)   LMP  (LMP Unknown)   SpO2 99%   BMI 30.13 kg/m

## 2020-07-12 NOTE — Progress Notes (Signed)
Please advise pt that her sodium remains low, has dropped an additional point.   Please advise pt to stop HCTZ, recheck sodium next Monday.

## 2020-07-12 NOTE — Addendum Note (Signed)
Addendum  Note by Jenell Milliner at 07/12/20 1350                Author: Tacy Learn M  Service: --  Author Type: Medical Assistant       Filed: 07/12/20 1527  Encounter Date: 07/12/2020  Status: Signed          Editor: Jenell Milliner (Medical Assistant)          Addended by: Jenell Milliner on: 07/12/2020 03:27 PM    Modules accepted: Orders

## 2020-07-12 NOTE — Progress Notes (Signed)
Chief Complaint   Patient presents with   ??? Hypertension     follow up   ??? Medication Evaluation     spironolactone concerns     Pt reports that her BP has been much better controlled at home, no readings over 140. Normally in the 120s/70s.     Pt does not want to take spironolactone. Pt is concerned about side effects.     (Please see previous results notes and refill notes about medication)    She initially did  Not want to switch to Lasix which is why spironolactone was perscribed, pt is now willing to try Lasix as needed.     Pt's HCTZ was stopped due to low sodium. Pt however disregarded this advice and continued medication, "I was too scared to stop it"    Pt has been on and off of fluid pills for a few years.     She has continued to have swelling in her ankles.     Subjective: (As above and below)     Chief Complaint   Patient presents with   ??? Hypertension     follow up   ??? Medication Evaluation     spironolactone concerns     she is a 79 y.o. year old female who presents for evaluation.    Reviewed PmHx, RxHx, FmHx, SocHx, AllgHx and updated in chart.    Review of Systems - negative except as listed above    Objective:     Vitals:    07/12/20 1412 07/12/20 1418   BP: (!) 163/78 (!) 168/83   Pulse: 86    Resp: 16    Temp: 97.8 ??F (36.6 ??C)    TempSrc: Oral    SpO2: 99%    Weight: 167 lb 6.4 oz (75.9 kg)    Height: 5' 2.5" (1.588 m)      Physical Examination: General appearance - alert, well appearing, and in no distress  Mental status - normal mood, behavior, speech, dress, motor activity, and thought processes  Ears - bilateral TM's and external ear canals normal  Chest - clear to auscultation, no wheezes, rales or rhonchi, symmetric air entry  Heart - normal rate, regular rhythm, normal S1, S2, no murmurs, rubs, clicks or gallops  Musculoskeletal - no joint tenderness, deformity or swelling  Extremities - peripheral pulses normal, no pedal edema, no clubbing or cyanosis    Assessment/ Plan:   1.  Hyponatremia  -recheck sodium  -extensive conversation about diuretics and sodium.   -pt will continue on HCTZ until Lasix is received from mail order pharmacy.   -will not take spironolactone  -hopefully sodium is improved and we can use Lasix PRN  - METABOLIC PANEL, BASIC; Future       I have discussed the diagnosis with the patient and the intended plan as seen in the above orders.  The patient has received an after-visit summary and questions were answered concerning future plans.     Medication Side Effects and Warnings were discussed with patient: yes  Patient Labs were reviewed: yes  Patient Past Records were reviewed:  yes    Pat Patrick, M.D.        Pt is still in PT.

## 2020-07-12 NOTE — Progress Notes (Signed)
Pt notified and voiced understanding, pt states she was a little confused because she was already taken off of HCTZ

## 2020-07-13 ENCOUNTER — Encounter: Attending: Family Medicine | Primary: Family Medicine

## 2020-07-13 LAB — METABOLIC PANEL, BASIC
Anion gap: 7 mmol/L (ref 5–15)
BUN/Creatinine ratio: 26 — ABNORMAL HIGH (ref 12–20)
BUN: 22 MG/DL — ABNORMAL HIGH (ref 6–20)
CO2: 26 mmol/L (ref 21–32)
Calcium: 9.9 MG/DL (ref 8.5–10.1)
Chloride: 94 mmol/L — ABNORMAL LOW (ref 97–108)
Creatinine: 0.84 MG/DL (ref 0.55–1.02)
GFR est AA: >60 mL/min/{1.73_m2} (ref >60)
GFR est non-AA: >60 mL/min/{1.73_m2} (ref >60)
Glucose: 93 mg/dL (ref 65–100)
Potassium: 4.2 mmol/L (ref 3.5–5.1)
Sodium: 127 mmol/L — ABNORMAL LOW (ref 136–145)

## 2020-07-13 LAB — BASIC METABOLIC PANEL
Anion Gap: 7 mmol/L (ref 5–15)
BUN: 22 MG/DL — ABNORMAL HIGH (ref 6–20)
Bun/Cre Ratio: 26 — ABNORMAL HIGH (ref 12–20)
CO2: 26 mmol/L (ref 21–32)
Calcium: 9.9 MG/DL (ref 8.5–10.1)
Chloride: 94 mmol/L — ABNORMAL LOW (ref 97–108)
Creatinine: 0.84 MG/DL (ref 0.55–1.02)
EGFR IF NonAfrican American: >60 mL/min/{1.73_m2} (ref >60)
GFR African American: >60 mL/min/{1.73_m2} (ref >60)
Glucose: 93 mg/dL (ref 65–100)
Potassium: 4.2 mmol/L (ref 3.5–5.1)
Sodium: 127 mmol/L — ABNORMAL LOW (ref 136–145)

## 2020-07-20 ENCOUNTER — Encounter: Attending: Family Medicine | Primary: Family Medicine

## 2020-07-27 ENCOUNTER — Telehealth: Payer: Self-pay | Admitting: Cardiovascular Disease

## 2020-07-27 ENCOUNTER — Other Ambulatory Visit: Admit: 2020-07-27 | Discharge: 2020-07-27 | Payer: MEDICARE | Attending: Family Medicine | Primary: Family Medicine

## 2020-07-27 DIAGNOSIS — E871 Hypo-osmolality and hyponatremia: Secondary | ICD-10-CM

## 2020-07-27 NOTE — Telephone Encounter (Signed)
Moved to [place].  Deleting recall per patient request.

## 2020-07-27 NOTE — Telephone Encounter (Signed)
 Received call from [place] at [facility] with [employer] Complaint.    Brief description of triage:   Edema the right ankle and leg, left has some swelling but the right is larger    Triage indicates for patient to be seen with in the next three days or UCC/ED if no available appointments     Care advice provided, patient verbalizes understanding; denies any other questions or concerns; instructed to call back for any new or worsening symptoms.    Writer provided warm transfer to [place] at [facility] for appointment scheduling.    Attention Provider:  Thank you for allowing me to participate in the care of your patient.  The patient was connected to triage in response to information provided to the E[facility].  Please do not respond through this encounter as the response is not directed to a shared pool.        Reason for Disposition  . MILD swelling of both ankles (i.e., pedal edema) AND new onset or worsening    Answer Assessment - Initial Assessment Questions  1. ONSET: When did the swelling start? (e.g., minutes, hours, days)      Has had edema in her legs for several years, states that it seems different this time    2. LOCATION: What part of the leg is swollen?  Are both legs swollen or just one leg?      See above note    3. SEVERITY: How bad is the swelling? (e.g., localized; mild, moderate, severe)   - Localized - small area of swelling localized to one leg   - MILD pedal edema - swelling limited to foot and ankle, pitting edema < 1/4 inch (6 mm) deep, rest and elevation eliminate most or all swelling   - MODERATE edema - swelling of lower leg to knee, pitting edema > 1/4 inch (6 mm) deep, rest and elevation only partially reduce swelling   - SEVERE edema - swelling extends above knee, facial or hand swelling present       Moderate, for right, mild for left    4. REDNESS: Does the swelling look red or infected?      No    5. PAIN: Is the swelling painful to touch? If so, ask: How painful is it?    (Scale 1-10; mild, moderate or severe)      No    6. FEVER: Do you have a fever? If so, ask: What is it, how was it measured, and when did it start?       No    7. CAUSE: What do you think is causing the leg swelling?      Venous insufficiency     8. MEDICAL HISTORY: Do you have a history of heart failure, kidney disease, liver failure, or cancer?      Mitral valve prolapse    9. RECURRENT SYMPTOM: Have you had leg swelling before? If so, ask: When was the last time? What happened that time?      Yes, states that it has been ongoing for several years and that she has used several different medications to help    10. OTHER SYMPTOMS: Do you have any other symptoms? (e.g., chest pain, difficulty breathing)       States that she has had some shortness of breath    11. PREGNANCY: Is there any chance you are pregnant? When was your last menstrual period?        n/a  Protocols used: LEG SWELLING AND EDEMA-ADULT-OH

## 2020-07-27 NOTE — Progress Notes (Signed)
Lab only

## 2020-07-27 NOTE — Progress Notes (Signed)
No additional comment

## 2020-07-28 ENCOUNTER — Ambulatory Visit: Attending: Nurse Practitioner | Primary: Family Medicine

## 2020-07-28 ENCOUNTER — Ambulatory Visit: Admit: 2020-07-28 | Discharge: 2020-07-28 | Payer: MEDICARE | Attending: Nurse Practitioner | Primary: Family Medicine

## 2020-07-28 DIAGNOSIS — I10 Essential (primary) hypertension: Secondary | ICD-10-CM

## 2020-07-28 DIAGNOSIS — R600 Localized edema: Secondary | ICD-10-CM

## 2020-07-28 LAB — METABOLIC PANEL, BASIC
Anion gap: 8 mmol/L (ref 5–15)
BUN/Creatinine ratio: 26 — ABNORMAL HIGH (ref 12–20)
BUN: 21 MG/DL — ABNORMAL HIGH (ref 6–20)
CO2: 24 mmol/L (ref 21–32)
Calcium: 10 MG/DL (ref 8.5–10.1)
Chloride: 102 mmol/L (ref 97–108)
Creatinine: 0.82 MG/DL (ref 0.55–1.02)
GFR est AA: >60 mL/min/{1.73_m2} (ref >60)
GFR est non-AA: >60 mL/min/{1.73_m2} (ref >60)
Glucose: 64 mg/dL — ABNORMAL LOW (ref 65–100)
Potassium: 4.7 mmol/L (ref 3.5–5.1)
Sodium: 134 mmol/L — ABNORMAL LOW (ref 136–145)

## 2020-07-28 LAB — BASIC METABOLIC PANEL
Anion Gap: 8 mmol/L (ref 5–15)
BUN: 21 MG/DL — ABNORMAL HIGH (ref 6–20)
Bun/Cre Ratio: 26 — ABNORMAL HIGH (ref 12–20)
CO2: 24 mmol/L (ref 21–32)
Calcium: 10 MG/DL (ref 8.5–10.1)
Chloride: 102 mmol/L (ref 97–108)
Creatinine: 0.82 MG/DL (ref 0.55–1.02)
EGFR IF NonAfrican American: >60 mL/min/{1.73_m2} (ref >60)
GFR African American: >60 mL/min/{1.73_m2} (ref >60)
Glucose: 64 mg/dL — ABNORMAL LOW (ref 65–100)
Potassium: 4.7 mmol/L (ref 3.5–5.1)
Sodium: 134 mmol/L — ABNORMAL LOW (ref 136–145)

## 2020-07-28 MED ORDER — CELECOXIB 100 MG CAP
100 mg | ORAL_CAPSULE | Freq: Two times a day (BID) | ORAL | 2 refills | Status: DC
Start: 2020-07-28 — End: 2020-08-26

## 2020-07-28 MED ORDER — VALSARTAN 40 MG TAB
40 mg | ORAL_TABLET | Freq: Every day | ORAL | 0 refills | Status: DC
Start: 2020-07-28 — End: 2020-08-26

## 2020-07-28 NOTE — Progress Notes (Signed)
 1. Have you been to the ER, urgent care clinic since your last visit?  Hospitalized since your last visit?No    2. Have you seen or consulted any other health care providers outside of the [facility] since your last visit?  Include any pap smears or colon screening. Yes, Podiatry 06/2020    There are no preventive care reminders to display for this patient.    Chief Complaint   Patient presents with   . Ankle swelling     Mainly in the (R) ankle but also in the (L) ankle   . Hypertension     Pt states BP has been fluctuating on the high a lot recently

## 2020-07-28 NOTE — Progress Notes (Signed)
Subjective:     Chief Complaint   Patient presents with   ??? Ankle swelling     Mainly in the (R) ankle but also in the (L) ankle   ??? Hypertension     Pt states BP has been fluctuating on the high a lot recently        HPI:  Lindsay Berry is a 79 y.o. female here for complaints of elevated BP and swelling in legs.    States that she has had Swelling in lower extremities for a while, right leg is a little more swollen than left (at least for the past month but lower extremity edema is noted to be chronic).  Last week seen by PT and told her that she needed to have her l/eg looked at.  Was taken off HCTZ secondary to hyponatremia, stopped taking 07/12/20  Dr Risser sent lasix in for her but she says that drug has never worked for her in the past so she never started it.  Then Dr Risser prescribed spironolactone but she refuses to take that, "due to possible side effects that I know that drug can cause".  Notes diovan increased in October for HTN but then was taken off her HCTZ  She also notes that she is on celebrex for arthralgias.  Wonders if that could be contributing.      Has seen vascular in the past (several years ago) and followed by cardiology as well, [facility], established with them in Spring.  Next follow up in June, with [facility].      Notes that she has not been on her normal diet or exercise or elevating her legs like she was in the past prior to covid.      No hospital, ER or specialist visits since last primary care visit except as noted above.    Past Medical History:   Diagnosis Date   ??? Colon polyps    ??? Diastolic dysfunction without heart failure    ??? Hypercholesterolemia    ??? Hypertension    ??? Hyponatremia    ??? Insomnia    ??? Meniere disease, unspecified laterality    ??? PAD (peripheral artery disease) (HCC)    ??? Situational anxiety    ??? Stenosis of right carotid artery    ??? Thyroid disease        Social History     Tobacco Use   ??? Smoking status: Never Smoker   ??? Smokeless tobacco: Never Used   Vaping Use    ??? Vaping Use: Never used   Substance Use Topics   ??? Alcohol use: Never   ??? Drug use: Never       Outpatient Medications Marked as Taking for the 07/28/20 encounter (Office Visit) with Robel Wuertz, Louanne Belton, NP   Medication Sig Dispense Refill   ??? loratadine (Claritin) 10 mg tablet Take 10 mg by mouth.     ??? valsartan (DIOVAN) 160 mg tablet Take 1 Tablet by mouth daily. 90 Tablet 3   ??? celecoxib (CELEBREX) 200 mg capsule Take 1 Capsule by mouth two (2) times a day for 90 days. 180 Capsule 1   ??? diazePAM (VALIUM) 2 mg tablet TAKE 1/2 TO 1 TABLET BY MOUTH EVERY 12 HOURS AS NEEDED FOR ANXIETY. MAX DAILY AMOUNT: 4 MG 45 Tablet 1   ??? amLODIPine (Norvasc) 10 mg tablet Take 1 Tablet by mouth daily. (Patient taking differently: Take 5 mg by mouth daily.) 90 Tablet 3   ??? pantoprazole (PROTONIX) 40 mg  tablet TAKE 1 TABLET BY MOUTH  TWICE DAILY 180 Tablet 3   ??? cholecalciferol (VITAMIN D3) (2,000 UNITS /50 MCG) cap capsule Take 1 Cap by mouth daily. 90 Cap 3   ??? aspirin delayed-release 81 mg tablet Take 81 mg by mouth daily.     ??? loratadine (CLARITIN) 10 mg tablet Take 10 mg by mouth daily.     ??? red yeast rice extract 600 mg cap Take 4 Caps by mouth daily.     ??? olopatadine (PATADAY) 0.2 % drop ophthalmic solution Administer 1 Drop to both eyes daily as needed.         Allergies   Allergen Reactions   ??? Ace Inhibitors Swelling     REACTION: swelling   ??? Beta-Blockers (Beta-Adrenergic Blocking Agts) Palpitations     REACTION: swelling   ??? Neomycin Swelling   ??? Statins-Hmg-Coa Reductase Inhibitors Swelling     REACTION: myalgia   ??? Tramadol Swelling     Does not want to take again.????  Constipation, decreased appetite, hot/chills, did not do well with it       Health Maintenance reviewed      ROS:  Gen: no fatigue, no fever, no chills, no unexplained weight loss or weight gain  Eyes: no excessive tearing, itching, or discharge  Nose: no rhinorrhea, no sinus pain  Mouth: no oral lesions, no sore throat, no difficulty  swallowing  Resp: no shortness of breath, no wheezing, no cough  CV: no chest pain, no orthopnea, no paroxysmal nocturnal dyspnea, no lower extremity edema, no palpitations  Abd: no nausea, no heartburn, no diarrhea, no constipation, no abdominal pain  Neuro: no headaches, no syncope or presyncopal episodes  Endo: no polyuria, no polydipsia.    GU: no hematuria, no dysuria, no frequency, no incontinence  Heme: no lymphadenopathy, no easy bruising or bleeding, no night sweats      PE:  Visit Vitals  BP (!) 163/81 (BP 1 Location: Left upper arm, BP Patient Position: Sitting, BP Cuff Size: Adult long)   Pulse 84   Temp 98.3 ??F (36.8 ??C) (Oral)   Resp 17   Ht 5' 2.5" (1.588 m)   Wt 167 lb 12.8 oz (76.1 kg)   LMP  (LMP Unknown)   SpO2 99%   BMI 30.20 kg/m??     Gen: alert, oriented, no acute distress  Head: normocephalic, atraumatic  Neck: symmetric normal sized thyroid, no carotid bruits, no jugular vein distention  Resp: no increase work of breathing, lungs clear to ausculation bilaterally, no wheezing, rales or rhonchi  CV: S1, S2 normal.  No murmurs, rubs, or gallops.    Abd: soft, not tender, not distended.  No hepatosplenomegaly. Normal bowel sounds.  No hernias. No abdominal or renal bruits.  Neuro: cranial nerves intact, normal strength and movement in all extremities, and sensation intact and symmetric.  Skin: no lesion or rash  Extremities: no cyanosis.  +1 edema to bilateral lower extremities (R slightly more edema than L)    No results found for this visit on 07/28/20.    Assessment/Plan:  Differential diagnosis and treatment options reviewed with patient who is in agreement with treatment plan as outlined below.    ICD-10-CM ICD-9-CM    1. Bilateral lower extremity edema  R60.0 782.3    2. Primary hypertension  I10 401.9 valsartan (DIOVAN) 40 mg tablet   3. Hyponatremia  E87.1 276.1    4. Arthralgia, unspecified joint  M25.50 719.40 celecoxib (CELEBREX) 100 mg capsule  Routine labs reviewed.  NA is  improving off HCTZ  Avoid NSAID, willing to lower celebrex dose to see if that helps  She refuses any diuretic at this time.  She does not feel that the diuretics have helped in the past.  Would like to try elevating her legs and "massaging them to help the flow".  Will increase diovan for BP control.  She says that she does not want to increase the dose to aggressively so will add 40 mg daily for now to her 160 mg (she has 40 mg dose at home) and return for nurse BP check in 1-2 weeks.   Continue PT  Consider seeing cardiology sooner for re-evaluation of BP and swelling.     Discussed BMI and healthy weight. Encouraged patient to work to implement changes including diet high in raw fruits and vegetables, lean protein and good fats. Limit refined, processed carbohydrates and sugar. Encouraged regular exercise.     I spent >30 minutes face to face with patient with >50% of time spent in counseling and coordinating care      I have discussed the diagnosis with the patient and the intended plan as seen in the above orders.  The patient has received an after-visit summary and questions were answered concerning future plans.  I have discussed medication side effects and warnings with the patient as well.  The patient verbalizes understanding and agreement with the plan.

## 2020-08-04 ENCOUNTER — Telehealth: Attending: Family Medicine | Primary: Family Medicine

## 2020-08-04 ENCOUNTER — Encounter: Attending: Nurse Practitioner | Primary: Family Medicine

## 2020-08-04 ENCOUNTER — Telehealth: Admit: 2020-08-04 | Discharge: 2020-08-04 | Payer: MEDICARE | Attending: Family Medicine | Primary: Family Medicine

## 2020-08-04 DIAGNOSIS — E871 Hypo-osmolality and hyponatremia: Secondary | ICD-10-CM

## 2020-08-04 NOTE — Progress Notes (Signed)
 1. Have you been to the ER, urgent care clinic since your last visit?  Hospitalized since your last visit?No    2. Have you seen or consulted any other health care providers outside of the [facility] since your last visit?  Include any pap smears or colon screening. No    There are no preventive care reminders to display for this patient.     Chief Complaint   Patient presents with   . Ankle swelling   . Leg Swelling   . Medication Evaluation

## 2020-08-04 NOTE — Progress Notes (Signed)
Chief Complaint   Patient presents with   ??? Ankle swelling   ??? Leg Swelling   ??? Medication Evaluation     Patient was evaluated via phone call only.    Patient reports that she had noted increased swelling, in her left ankle more than her right.  This was also noted by her physical therapist.    Patient has had several recent visits related to her low sodium, her hydrochlorothiazide was stopped.  Patient is concerned that her swelling and her blood pressure have gotten worse since she came off the hydrochlorothiazide.    Patient has been very reluctant to start on alternate fluid pill, has multiple concerns about side effects of the medication    She has been taking 40 mg of the Diovan along with her 160 mg tablet.  Patient is currently out of her 40 mg tablet as of today.  Would like to continue on just the 160 along with the Lasix due to concerns over overly lowering her blood pressure.    Patient has noticed an increase in shortness of breath especially with activity since coming off of hydrochlorothiazide.    Lindsay Berry is a 79 y.o. female, evaluated via audio-only technology on 08/04/2020 for Ankle swelling, Leg Swelling, and Medication Evaluation  .    Assessment & Plan:   Diagnoses and all orders for this visit:    1. Hyponatremia    2. Bilateral lower extremity edema    3. Primary hypertension    -Pt agree to starting on 10mg  Lasix daily (half of a 20mg  tablet)  -Advised to continue on 160mg  of Diovan daily, may need to add back an additional 40mg  if BP remains elevated despite adding Lasix  -encouraged daily walking    -recheck labs 2-3 weeks after starting on low dose lasix        12  Subjective:       Prior to Admission medications    Medication Sig Start Date End Date Taking? Authorizing Provider   furosemide (LASIX) 20 mg tablet Take 1 Tablet by mouth daily as needed (swelling). 08/04/20  Yes Mckay Brandt, , MD   valsartan (DIOVAN) 40 mg tablet Take 1 Tablet by mouth daily. Indications: high  blood pressure 07/28/20  Yes Polo, Tiffany E, NP   celecoxib (CELEBREX) 100 mg capsule Take 1 Capsule by mouth two (2) times a day. 07/28/20  Yes Polo, Tiffany E, NP   loratadine (Claritin) 10 mg tablet Take 10 mg by mouth.   Yes Provider, Historical   valsartan (DIOVAN) 160 mg tablet Take 1 Tablet by mouth daily. 06/21/20  Yes Dearies Meikle, Lindsay Nightingale, MD   diazePAM (VALIUM) 2 mg tablet TAKE 1/2 TO 1 TABLET BY MOUTH EVERY 12 HOURS AS NEEDED FOR ANXIETY. MAX DAILY AMOUNT: 4 MG 06/08/20  Yes Donelda Mailhot, 07/30/20, MD   amLODIPine (Norvasc) 10 mg tablet Take 1 Tablet by mouth daily.  Patient taking differently: Take 5 mg by mouth daily. 05/20/20  Yes Preslee Regas, Lindsay Nightingale, MD   pantoprazole (PROTONIX) 40 mg tablet TAKE 1 TABLET BY MOUTH  TWICE DAILY 03/04/20  Yes Clydell Sposito, Lindsay Nightingale, MD   cholecalciferol (VITAMIN D3) (2,000 UNITS /50 MCG) cap capsule Take 1 Cap by mouth daily. 12/31/19  Yes Verlia Kaney, Lindsay Nightingale, MD   aspirin delayed-release 81 mg tablet Take 81 mg by mouth daily.   Yes Provider, Historical   loratadine (CLARITIN) 10 mg tablet Take 10 mg by mouth daily.   Yes Provider, Historical  red yeast rice extract 600 mg cap Take 4 Caps by mouth daily.   Yes Provider, Historical   olopatadine (PATADAY) 0.2 % drop ophthalmic solution Administer 1 Drop to both eyes daily as needed.   Yes Provider, Historical     Allergies   Allergen Reactions   ??? Ace Inhibitors Swelling     REACTION: swelling   ??? Beta-Blockers (Beta-Adrenergic Blocking Agts) Palpitations     REACTION: swelling   ??? Neomycin Swelling   ??? Statins-Hmg-Coa Reductase Inhibitors Swelling     REACTION: myalgia   ??? Tramadol Swelling     Does not want to take again.????  Constipation, decreased appetite, hot/chills, did not do well with it       Review of Systems   Constitutional: Negative for chills, fever and malaise/fatigue.   HENT: Negative for congestion and sore throat.    Respiratory: Negative for cough and shortness of breath.    Cardiovascular:  Positive for leg swelling. Negative for chest pain and palpitations.   Gastrointestinal: Negative for abdominal pain, heartburn, nausea and vomiting.   Genitourinary: Negative for dysuria and urgency.   Musculoskeletal: Negative for joint pain and myalgias.   Neurological: Negative for dizziness, tingling and headaches.   All other systems reviewed and are negative.      Patient-Reported Vitals 08/04/2020   Patient-Reported Pulse 80   Patient-Reported Systolic  1.72       Lindsay Berry, who was evaluated through a patient-initiated, synchronous (real-time) audio only encounter, and/or her healthcare decision maker, is aware that it is a billable service, with coverage as determined by her insurance carrier. She provided verbal consent to proceed: Yes. She has not had a related appointment within my department in the past 7 days or scheduled within the next 24 hours.    On this date 08/04/2020 I have spent 24 minutes reviewing previous notes, test results and face to face (virtual) with the patient discussing the diagnosis and importance of compliance with the treatment plan as well as documenting on the day of the visit.    Lindsay Nightingale [name], MD

## 2020-08-17 ENCOUNTER — Encounter

## 2020-08-17 MED ORDER — DIAZEPAM 2 MG TAB
2 mg | ORAL_TABLET | ORAL | 2 refills | Status: DC
Start: 2020-08-17 — End: 2020-12-22

## 2020-08-19 ENCOUNTER — Other Ambulatory Visit: Admit: 2020-08-19 | Discharge: 2020-08-19 | Payer: MEDICARE | Attending: Family Medicine | Primary: Family Medicine

## 2020-08-19 ENCOUNTER — Encounter: Attending: Family Medicine | Primary: Family Medicine

## 2020-08-19 DIAGNOSIS — E871 Hypo-osmolality and hyponatremia: Secondary | ICD-10-CM

## 2020-08-19 NOTE — Progress Notes (Signed)
No additional comment

## 2020-08-19 NOTE — Addendum Note (Signed)
Addendum  Note by Faythe Casa at 08/19/20 1145                Author: Faythe Casa  Service: --  Author Type: Technician       Filed: 08/19/20 1154  Encounter Date: 08/19/2020  Status: Signed          Editor: Faythe Casa (Technician)          Addended by: Faythe Casa on: 08/19/2020 11:54 AM    Modules accepted: Orders

## 2020-08-20 LAB — METABOLIC PANEL, BASIC
Anion gap: 7 mmol/L (ref 5–15)
BUN/Creatinine ratio: 24 — ABNORMAL HIGH (ref 12–20)
BUN: 21 MG/DL — ABNORMAL HIGH (ref 6–20)
CO2: 26 mmol/L (ref 21–32)
Calcium: 9.4 MG/DL (ref 8.5–10.1)
Chloride: 99 mmol/L (ref 97–108)
Creatinine: 0.86 MG/DL (ref 0.55–1.02)
GFR est AA: >60 mL/min/{1.73_m2} (ref >60)
GFR est non-AA: >60 mL/min/{1.73_m2} (ref >60)
Glucose: 93 mg/dL (ref 65–100)
Potassium: 4.3 mmol/L (ref 3.5–5.1)
Sodium: 132 mmol/L — ABNORMAL LOW (ref 136–145)

## 2020-08-20 LAB — BASIC METABOLIC PANEL
Anion Gap: 7 mmol/L (ref 5–15)
BUN: 21 MG/DL — ABNORMAL HIGH (ref 6–20)
Bun/Cre Ratio: 24 — ABNORMAL HIGH (ref 12–20)
CO2: 26 mmol/L (ref 21–32)
Calcium: 9.4 MG/DL (ref 8.5–10.1)
Chloride: 99 mmol/L (ref 97–108)
Creatinine: 0.86 MG/DL (ref 0.55–1.02)
EGFR IF NonAfrican American: >60 mL/min/{1.73_m2} (ref >60)
GFR African American: >60 mL/min/{1.73_m2} (ref >60)
Glucose: 93 mg/dL (ref 65–100)
Potassium: 4.3 mmol/L (ref 3.5–5.1)
Sodium: 132 mmol/L — ABNORMAL LOW (ref 136–145)

## 2020-08-26 ENCOUNTER — Telehealth: Attending: Family Medicine | Primary: Family Medicine

## 2020-08-26 ENCOUNTER — Telehealth: Admit: 2020-08-26 | Discharge: 2020-08-26 | Payer: MEDICARE | Attending: Family Medicine | Primary: Family Medicine

## 2020-08-26 DIAGNOSIS — I10 Essential (primary) hypertension: Secondary | ICD-10-CM

## 2020-08-26 NOTE — Progress Notes (Signed)
 1. Have you been to the ER, urgent care clinic since your last visit?  Hospitalized since your last visit?No    2. Have you seen or consulted any other health care providers outside of the [facility] since your last visit?  Include any pap smears or colon screening. No    There are no preventive care reminders to display for this patient.    Chief Complaint   Patient presents with   . Medication Evaluation

## 2020-08-26 NOTE — Progress Notes (Signed)
Chief Complaint   Patient presents with   ??? Medication Evaluation     Patient was evaluated via phone call only.    Patient reports that she started on Lasix at the beginning of December, stopped after 10 days due to side effect of nausea.  Patient did not feel as though it was helping her blood pressure.    Patient reports that she has not had any swelling since stopping the medication.    Diastolic from 71-76  Systolic from 129-140s    Pt reports that she feels much better.        Lindsay Berry is a 79 y.o. female, evaluated via audio-only technology on 08/26/2020 for Medication Evaluation  .    Assessment & Plan:   Diagnoses and all orders for this visit:    1. Primary hypertension    2. Hyponatremia    3. Lower extremity edema      Advised patient to continue on what she is currently doing.  Blood pressures at home are well controlled.  Sodium has improved and is stable.    Patient reports that no swelling has returned.    Patient has cut back on Celebrex 100 mg once a day, feels great, would like to cut back further.  Agreed with this plan  12  Subjective:       Prior to Admission medications    Medication Sig Start Date End Date Taking? Authorizing Provider   diazePAM (VALIUM) 2 mg tablet TAKE 1/2 TO 1 TABLET BY MOUTH EVERY 12 HOURS AS NEEDED FOR ANXIETY. MAX DAILY AMOUNT: 4 MG 08/17/20  Yes Maxximus Gotay, Sheron Nightingale, MD   celecoxib (CELEBREX) 100 mg capsule Take 1 Capsule by mouth two (2) times a day. 07/28/20  Yes Polo, Tiffany E, NP   loratadine (Claritin) 10 mg tablet Take 10 mg by mouth.   Yes Provider, Historical   valsartan (DIOVAN) 160 mg tablet Take 1 Tablet by mouth daily. 06/21/20  Yes Veva Grimley, Sheron Nightingale, MD   amLODIPine (Norvasc) 10 mg tablet Take 1 Tablet by mouth daily.  Patient taking differently: Take 5 mg by mouth daily. 05/20/20  Yes Melda Mermelstein, Sheron Nightingale, MD   pantoprazole (PROTONIX) 40 mg tablet TAKE 1 TABLET BY MOUTH  TWICE DAILY 03/04/20  Yes Larose Batres, Sheron Nightingale, MD    cholecalciferol (VITAMIN D3) (2,000 UNITS /50 MCG) cap capsule Take 1 Cap by mouth daily. 12/31/19  Yes Jolanda Mccann, Sheron Nightingale, MD   aspirin delayed-release 81 mg tablet Take 81 mg by mouth daily.   Yes Provider, Historical   loratadine (CLARITIN) 10 mg tablet Take 10 mg by mouth daily.   Yes Provider, Historical   red yeast rice extract 600 mg cap Take 4 Caps by mouth daily.   Yes Provider, Historical   olopatadine (PATADAY) 0.2 % drop ophthalmic solution Administer 1 Drop to both eyes daily as needed.   Yes Provider, Historical   furosemide (LASIX) 20 mg tablet Take 1 Tablet by mouth daily as needed (swelling).  Patient not taking: Reported on 08/26/2020 08/04/20   Tiye Huwe, Sheron Nightingale, MD   valsartan (DIOVAN) 40 mg tablet Take 1 Tablet by mouth daily. Indications: high blood pressure  Patient not taking: Reported on 08/26/2020 07/28/20   Polo, Tiffany E, NP     Allergies   Allergen Reactions   ??? Ace Inhibitors Swelling     REACTION: swelling   ??? Beta-Blockers (Beta-Adrenergic Blocking Agts) Palpitations     REACTION: swelling   ??? Neomycin Swelling   ???  Statins-Hmg-Coa Reductase Inhibitors Swelling     REACTION: myalgia   ??? Tramadol Swelling     Does not want to take again.????  Constipation, decreased appetite, hot/chills, did not do well with it       Review of Systems   Constitutional: Negative for chills, fever and malaise/fatigue.   HENT: Negative for congestion and sore throat.    Respiratory: Negative for cough and shortness of breath.    Cardiovascular: Negative for chest pain and palpitations.   Gastrointestinal: Negative for abdominal pain, heartburn, nausea and vomiting.   Genitourinary: Negative for dysuria and urgency.   Musculoskeletal: Negative for joint pain and myalgias.   Neurological: Negative for dizziness, tingling and headaches.   All other systems reviewed and are negative.      Patient-Reported Vitals 08/26/2020   Patient-Reported Pulse 72   Patient-Reported Systolic  129    Patient-Reported Diastolic [address] who was evaluated through a patient-initiated, synchronous (real-time) audio only encounter, and/or her healthcare decision maker, is aware that it is a billable service, with coverage as determined by her insurance carrier. She provided verbal consent to proceed: Yes. She has not had a related appointment within my department in the past 7 days or scheduled within the next 24 hours.    On this date 08/26/2020 I have spent 14 minutes reviewing previous notes, test results and face to face (virtual) with the patient discussing the diagnosis and importance of compliance with the treatment plan as well as documenting on the day of the visit.    Sheron Nightingale Georgiana Spillane, MD

## 2020-08-30 ENCOUNTER — Encounter

## 2020-08-30 NOTE — Telephone Encounter (Signed)
 Requesting Refill (fax sent over)     Requested Prescriptions     Pending Prescriptions Disp Refills   . amLODIPine  (Norvasc ) 10 mg tablet 90 Tablet 3     Sig: Take 1 Tablet by mouth daily.     Thank You

## 2020-09-01 MED ORDER — AMLODIPINE 10 MG TAB
10 mg | ORAL_TABLET | Freq: Every day | ORAL | 3 refills | Status: DC
Start: 2020-09-01 — End: 2020-12-22

## 2020-10-01 ENCOUNTER — Telehealth: Payer: Self-pay | Admitting: Internal Medicine

## 2020-10-01 NOTE — Telephone Encounter (Signed)
Called and spoke to patient.  Patient is requesting that we fax documentation to medicare stating that she is using and benefiting from cpap.  Patient stated that this is needed in order for her to receive a replacement machine.  Last OV note has been faxed to Medicare at [phone]. Received successful fax confirmation.  Nothing further needed.

## 2020-10-07 ENCOUNTER — Telehealth: Payer: Self-pay | Admitting: Internal Medicine

## 2020-10-07 NOTE — Telephone Encounter (Signed)
Download is available via Jean Rosenthal to [place] with [employer] and confirmed that a phone visit is acceptable. Patient confirmed that she has been attempting to obtain a replacement machine since July. She was advised by [employer] that there is a delay due to recent recall.  appt note has been updated to reflect phone visit.  Nothing further needed at this time.   Will route to [place] as an [place].

## 2020-10-07 NOTE — Telephone Encounter (Signed)
Called and spoke to patient.  Patient stated that her current cpap machine is broken. Medicare is requring that she have a more recent office visit.  She is requesting a phone visit.   Aaron Edelman, please advise if okay for phone visit? You have availability on 10/21/2020.

## 2020-10-07 NOTE — Telephone Encounter (Signed)
This should be sufficient but will need to have a recent compliance report in order to complete this visit.  If the patient is not on [employer] she will need to bring an SD card.  It looks like patient has been dealing with needing a replacement CPAP since July 2021 is this accurate?  We may need to check with [employer] to ensure if they will take a telephone visit for documentation for this.  Wyn Quaker, FNP

## 2020-10-21 ENCOUNTER — Encounter: Payer: Self-pay | Admitting: Pulmonary Disease

## 2020-10-21 ENCOUNTER — Ambulatory Visit (INDEPENDENT_AMBULATORY_CARE_PROVIDER_SITE_OTHER): Payer: Medicare Other | Admitting: Pulmonary Disease

## 2020-10-21 ENCOUNTER — Other Ambulatory Visit: Payer: Self-pay

## 2020-10-21 DIAGNOSIS — G4733 Obstructive sleep apnea (adult) (pediatric): Secondary | ICD-10-CM | POA: Diagnosis not present

## 2020-10-21 NOTE — Addendum Note (Signed)
Addended by: Claudette Head A on: 10/21/2020 04:08 PM   Modules accepted: Orders

## 2020-10-21 NOTE — Patient Instructions (Addendum)
You were seen today by Lauraine Rinne, NP  for:   1. Obstructive sleep apnea  New CPAP order today  We will send message to your DME company to see if the process can be expedited given the fact the been waiting on CPAP since July/2021  We recommend that you continue using your CPAP daily >>>Keep up the hard work using your device >>> Goal should be wearing this for the entire night that you are sleeping, at least 4 to 6 hours  Remember:  . Do not drive or operate heavy machinery if tired or drowsy.  . Please notify the supply company and office if you are unable to use your device regularly due to missing supplies or machine being broken.  . Work on maintaining a healthy weight and following your recommended nutrition plan  . Maintain proper daily exercise and movement  . Maintaining proper use of your device can also help improve management of other chronic illnesses such as: Blood pressure, blood sugars, and weight management.   BiPAP/ CPAP Cleaning:  >>>Clean weekly, with Dawn soap, and bottle brush.  Set up to air dry. >>> Wipe mask out daily with wet wipe or towelette    Follow Up:    Return in about 1 year (around 10/21/2021), or if symptoms worsen or fail to improve, for [facility] - Dr. Mortimer Fries.   Notification of test results are managed in the following manner: If there are  any recommendations or changes to the  plan of care discussed in office today,  we will contact you and let you know what they are. If you do not hear from [place], then your results are normal and you can view them through your  MyChart account , or a letter will be sent to you. Thank you again for trusting [place] with your care  - Thank you, [facility]    It is flu season:   >>> Best ways to protect herself from the flu: Receive the yearly flu vaccine, practice good hand hygiene washing with soap and also using hand sanitizer when available, eat a nutritious meals, get adequate rest, hydrate  appropriately       Please contact the office if your symptoms worsen or you have concerns that you are not improving.   Thank you for choosing [facility] for your healthcare, and for allowing [place] to partner with you on your healthcare journey. I am thankful to be able to provide care to you today.   Wyn Quaker FNP-C

## 2020-10-21 NOTE — Assessment & Plan Note (Signed)
Plan: Continue CPAP therapy Order for new CPAP same settings Message to be sent to [place] to see if process can be expedited as patient has been waiting on a replacement since July/2021

## 2020-10-21 NOTE — Progress Notes (Addendum)
Virtual Visit via Telephone Note  I connected with Zamya Culhane on 10/21/20 at  4:00 PM EST by telephone and verified that I am speaking with the correct person using two identifiers.  Location: Patient: Home Provider: [facility] - [address], [place], [place], [place] [address]   I discussed the limitations, risks, security and privacy concerns of performing an evaluation and management service by telephone and the availability of in person appointments. I also discussed with the patient that there may be a patient responsible charge related to this service. The patient expressed understanding and agreed to proceed.  Patient consented to consult via telephone: Yes People present and their role in pt care: Pt   History of Present Illness:  80 year old female never smoker fonder office for obstructive sleep apnea  Past medical history: Hyperlipidemia, history of thyroid nodules, PAD, Raynaud's phenomenon, diastolic dysfunction Smoking history: Never smoker Maintenance: None Patient of [facility]   Chief complaint: Televisit-patient needs order for new CPAP  80 year old female never smoker followed in our office for obstructive sleep apnea.  Patient continues to utilize her CPAP every night.  Patient has been trying to obtain a new CPAP since July/2021.  Her current CPAP needs to be replaced and is making abnormal noises and is unsure how long this will last.  Is greater than 34 years old.  Her DME company is [employer].  [employer] is requiring to have the patient complete a televisit today as her last office visit was over 6 months ago per insurance guidelines. CPAP compliance report listed below:  09/20/2020-10/19/2018 22-30 had a last 30 days use, all 30 those days greater than 4 hours, average usage 8 hours and 33 minutes, APAP setting 5-15, AHI 0.6   Patient is currently benefiting from cpap machine and she does not have daytime sleepiness.  Observations/Objective:  Social  History   Tobacco Use  Smoking Status Never Smoker  Smokeless Tobacco Never Used   Immunization History  Administered Date(s) Administered  . Influenza Split 08/10/2011, 06/28/2012  . Influenza Whole 07/12/2007, 08/11/2009  . Influenza,inj,Quad PF,6+ Mos 06/17/2013, 07/24/2014, 06/08/2016, 06/21/2017, 07/12/2018  . Influenza-Unspecified 07/30/2015  . Pneumococcal Polysaccharide-23 08/13/2008  . Td 09/04/1997, 04/04/2009, 04/14/2009, 01/05/2017  . Zoster 01/27/2008      Assessment and Plan:  Obstructive sleep apnea Plan: Continue CPAP therapy Order for new CPAP same settings Message to be sent to [place] to see if process can be expedited as patient has been waiting on a replacement since July/2021   Follow Up Instructions:  Return in about 1 year (around 10/21/2021), or if symptoms worsen or fail to improve, for [facility] - Dr. Mortimer Fries.   I discussed the assessment and treatment plan with the patient. The patient was provided an opportunity to ask questions and all were answered. The patient agreed with the plan and demonstrated an understanding of the instructions.   The patient was advised to call back or seek an in-person evaluation if the symptoms worsen or if the condition fails to improve as anticipated.  I provided 12 minutes of non-face-to-face time during this encounter.   Lauraine Rinne, NP

## 2020-11-18 ENCOUNTER — Telehealth: Payer: Self-pay | Admitting: Pulmonary Disease

## 2020-11-18 NOTE — Telephone Encounter (Signed)
Spoke to [place] with [facility], who is requesting addendum to 10/21/2020 office note.  Note needs to state that patient is benefiting from cpap machine and she does not have daytime sleepiness.Aaron Edelman, please advise. thanks

## 2020-11-19 NOTE — Telephone Encounter (Signed)
Ov note has been faxed to [employer] has provided fax number.  Nothing further needed at this time.

## 2020-11-19 NOTE — Telephone Encounter (Signed)
Done   Aaron Edelman

## 2020-12-14 ENCOUNTER — Other Ambulatory Visit: Admit: 2020-12-14 | Discharge: 2020-12-14 | Payer: MEDICARE | Primary: Family Medicine

## 2020-12-14 DIAGNOSIS — I10 Essential (primary) hypertension: Secondary | ICD-10-CM

## 2020-12-14 NOTE — Addendum Note (Signed)
Addendum  Note by Faythe Casa at 12/14/20 0900                Author: Faythe Casa  Service: --  Author Type: Technician       Filed: 12/14/20 1034  Encounter Date: 12/14/2020  Status: Signed          Editor: Faythe Casa (Technician)          Addended by: Faythe Casa on: 12/14/2020 10:34 AM    Modules accepted: Orders

## 2020-12-14 NOTE — Progress Notes (Signed)
No additional comment

## 2020-12-15 LAB — METABOLIC PANEL, COMPREHENSIVE
A-G Ratio: 1.5 (ref 1.1–2.2)
ALT (SGPT): 36 U/L (ref 12–78)
AST (SGOT): 22 U/L (ref 15–37)
Albumin: 4.8 g/dL (ref 3.5–5.0)
Alk. phosphatase: 120 U/L — ABNORMAL HIGH (ref 45–117)
Anion gap: 8 mmol/L (ref 5–15)
BUN/Creatinine ratio: 26 — ABNORMAL HIGH (ref 12–20)
BUN: 23 MG/DL — ABNORMAL HIGH (ref 6–20)
Bilirubin, total: 0.6 MG/DL (ref 0.2–1.0)
CO2: 26 mmol/L (ref 21–32)
Calcium: 9.9 MG/DL (ref 8.5–10.1)
Chloride: 100 mmol/L (ref 97–108)
Creatinine: 0.89 MG/DL (ref 0.55–1.02)
GFR est AA: >60 mL/min/{1.73_m2} (ref >60)
GFR est non-AA: >60 mL/min/{1.73_m2} (ref >60)
Globulin: 3.1 g/dL (ref 2.0–4.0)
Glucose: 96 mg/dL (ref 65–100)
Potassium: 4.3 mmol/L (ref 3.5–5.1)
Protein, total: 7.9 g/dL (ref 6.4–8.2)
Sodium: 134 mmol/L — ABNORMAL LOW (ref 136–145)

## 2020-12-15 LAB — CBC W/O DIFF
ABSOLUTE NRBC: 0 10*3/uL (ref 0.00–0.01)
HCT: 47.1 % — ABNORMAL HIGH (ref 35.0–47.0)
HGB: 15.3 g/dL (ref 11.5–16.0)
MCH: 30.4 PG (ref 26.0–34.0)
MCHC: 32.5 g/dL (ref 30.0–36.5)
MCV: 93.6 FL (ref 80.0–99.0)
MPV: 9.2 FL (ref 8.9–12.9)
NRBC: 0 PER 100 WBC
PLATELET: 295 10*3/uL (ref 150–400)
RBC: 5.03 M/uL (ref 3.80–5.20)
RDW: 13.2 % (ref 11.5–14.5)
WBC: 6.7 10*3/uL (ref 3.6–11.0)

## 2020-12-15 LAB — LIPID PANEL
CHOL/HDL Ratio: 2.9 (ref 0.0–5.0)
Chol/HDL Ratio: 2.9 (ref 0.0–5.0)
Cholesterol, Total: 211 MG/DL — ABNORMAL HIGH (ref <200)
Cholesterol, total: 211 MG/DL — ABNORMAL HIGH (ref <200)
HDL Cholesterol: 74 MG/DL
HDL: 74 MG/DL
LDL Calculated: 105.8 MG/DL — ABNORMAL HIGH (ref 0–100)
LDL, calculated: 105.8 MG/DL — ABNORMAL HIGH (ref 0–100)
Triglyceride: 156 MG/DL — ABNORMAL HIGH (ref <150)
Triglycerides: 156 MG/DL — ABNORMAL HIGH (ref <150)
VLDL Cholesterol Calculated: 31.2 MG/DL
VLDL, calculated: 31.2 MG/DL

## 2020-12-15 LAB — SAMPLES BEING HELD

## 2020-12-15 LAB — TSH 3RD GENERATION
TSH: 1.4 u[IU]/mL (ref 0.36–3.74)
TSH: 1.4 u[IU]/mL (ref 0.36–3.74)

## 2020-12-15 LAB — VITAMIN D, 25 HYDROXY: Vitamin D 25-Hydroxy: 37.4 ng/mL (ref 30–100)

## 2020-12-15 LAB — HEMOGLOBIN A1C WITH EAG
Est. average glucose: 108 mg/dL
Hemoglobin A1c: 5.4 % (ref 4.0–5.6)

## 2020-12-15 LAB — COMPREHENSIVE METABOLIC PANEL
ALT: 36 U/L (ref 12–78)
AST: 22 U/L (ref 15–37)
Albumin/Globulin Ratio: 1.5 (ref 1.1–2.2)
Albumin: 4.8 g/dL (ref 3.5–5.0)
Alkaline Phosphatase: 120 U/L — ABNORMAL HIGH (ref 45–117)
Anion Gap: 8 mmol/L (ref 5–15)
BUN: 23 MG/DL — ABNORMAL HIGH (ref 6–20)
Bun/Cre Ratio: 26 — ABNORMAL HIGH (ref 12–20)
CO2: 26 mmol/L (ref 21–32)
Calcium: 9.9 MG/DL (ref 8.5–10.1)
Chloride: 100 mmol/L (ref 97–108)
Creatinine: 0.89 MG/DL (ref 0.55–1.02)
EGFR IF NonAfrican American: >60 mL/min/{1.73_m2} (ref >60)
GFR African American: >60 mL/min/{1.73_m2} (ref >60)
Globulin: 3.1 g/dL (ref 2.0–4.0)
Glucose: 96 mg/dL (ref 65–100)
Potassium: 4.3 mmol/L (ref 3.5–5.1)
Sodium: 134 mmol/L — ABNORMAL LOW (ref 136–145)
Total Bilirubin: 0.6 MG/DL (ref 0.2–1.0)
Total Protein: 7.9 g/dL (ref 6.4–8.2)

## 2020-12-15 LAB — CBC
Hematocrit: 47.1 % — ABNORMAL HIGH (ref 35.0–47.0)
Hemoglobin: 15.3 g/dL (ref 11.5–16.0)
MCH: 30.4 PG (ref 26.0–34.0)
MCHC: 32.5 g/dL (ref 30.0–36.5)
MCV: 93.6 FL (ref 80.0–99.0)
MPV: 9.2 FL (ref 8.9–12.9)
NRBC Absolute: 0 10*3/uL (ref 0.00–0.01)
Nucleated RBCs: 0 PER 100 WBC
Platelets: 295 10*3/uL (ref 150–400)
RBC: 5.03 M/uL (ref 3.80–5.20)
RDW: 13.2 % (ref 11.5–14.5)
WBC: 6.7 10*3/uL (ref 3.6–11.0)

## 2020-12-15 LAB — HEMOGLOBIN A1C W/EAG
Hemoglobin A1C: 5.4 % (ref 4.0–5.6)
eAG: 108 mg/dL

## 2020-12-15 LAB — VITAMIN D 25 HYDROXY: Vit D, 25-Hydroxy: 37.4 ng/mL (ref 30–100)

## 2020-12-16 ENCOUNTER — Encounter: Primary: Family Medicine

## 2020-12-22 ENCOUNTER — Ambulatory Visit: Attending: Family Medicine | Primary: Family Medicine

## 2020-12-22 ENCOUNTER — Ambulatory Visit: Admit: 2020-12-22 | Discharge: 2020-12-22 | Payer: MEDICARE | Attending: Family Medicine | Primary: Family Medicine

## 2020-12-22 DIAGNOSIS — F5104 Psychophysiologic insomnia: Secondary | ICD-10-CM

## 2020-12-22 DIAGNOSIS — E871 Hypo-osmolality and hyponatremia: Secondary | ICD-10-CM

## 2020-12-22 MED ORDER — AMLODIPINE 5 MG TAB
5 mg | ORAL_TABLET | Freq: Every day | ORAL | 1 refills | Status: DC
Start: 2020-12-22 — End: 2020-12-22

## 2020-12-22 MED ORDER — AMLODIPINE 10 MG TAB
10 mg | ORAL_TABLET | Freq: Every day | ORAL | 3 refills | Status: DC
Start: 2020-12-22 — End: 2021-02-28

## 2020-12-22 MED ORDER — DIAZEPAM 2 MG TAB
2 mg | ORAL_TABLET | ORAL | 2 refills | Status: DC
Start: 2020-12-22 — End: 2021-06-06

## 2020-12-22 NOTE — Progress Notes (Signed)
Chief Complaint   Patient presents with   ??? Annual Wellness Visit     Pt has noticed two spots on her hands, scaly and raised, not painful.     Pt reports that she has had two tragic deaths in her family. Has been very stressful.     Subjective: (As above and below)     Chief Complaint   Patient presents with   ??? Annual Wellness Visit     she is a 80 y.o. year old female who presents for evaluation.    Reviewed PmHx, RxHx, FmHx, SocHx, AllgHx and updated in chart.    Review of Systems - negative except as listed above    Objective:     Vitals:    12/22/20 1030 12/22/20 1050   BP: (!) 184/81 (!) 175/82   Pulse: 80    Resp: 16    Temp: 98.1 ??F (36.7 ??C)    TempSrc: Oral    SpO2: 98%    Weight: 171 lb 6.4 oz (77.7 kg)    Height: 5' 2.5" (1.588 m)      Physical Examination: General appearance - alert, well appearing, and in no distress  Mental status - tearful when discussing recent tragedies  Ears - bilateral TM's and external ear canals normal  Chest - clear to auscultation, no wheezes, rales or rhonchi, symmetric air entry  Heart - normal rate, regular rhythm, normal S1, S2, no murmurs, rubs, clicks or gallops  Musculoskeletal - no joint tenderness, deformity or swelling  Extremities - peripheral pulses normal, no pedal edema, no clubbing or cyanosis    Assessment/ Plan:   1. Chronic insomnia  -stable, uses as needed  - diazePAM (VALIUM) 2 mg tablet; TAKE 1/2 TO 1 TABLET BY MOUTH EVERY 12 HOURS AS NEEDED FOR ANXIETY. MAX DAILY AMOUNT: 4 MG  Dispense: 45 Tablet; Refill: 2    2. Other chronic pain  -reviewed as needed use  - diazePAM (VALIUM) 2 mg tablet; TAKE 1/2 TO 1 TABLET BY MOUTH EVERY 12 HOURS AS NEEDED FOR ANXIETY. MAX DAILY AMOUNT: 4 MG  Dispense: 45 Tablet; Refill: 2    3. Essential hypertension  -elevated in office today, pt has only been taking 5mg  of amlodipine, asked to increase to 10 as written  - amLODIPine (Norvasc) 5 mg tablet; Take 1 Tablet by mouth daily.  Dispense: 90 Tablet; Refill: 1    4. PAD  (peripheral artery disease) (HCC)  -stable, walks with a walker    5. Hyponatremia  -stable on recent labs    6. Hypercholesterolemia  -well controlled    7. Vitamin D deficiency, unspecified  -continue on supplement        I have discussed the diagnosis with the patient and the intended plan as seen in the above orders.  The patient has received an after-visit summary and questions were answered concerning future plans.     Medication Side Effects and Warnings were discussed with patient: yes  Patient Labs were reviewed: yes  Patient Past Records were reviewed:  yes    , M.D.      This is the Subsequent Medicare Annual Wellness Exam, performed 12 months or more after the Initial AWV or the last Subsequent AWV    I have reviewed the patient's medical history in detail and updated the computerized patient record.       Assessment/Plan   Education and counseling provided:  Are appropriate based on today's review and evaluation    1.  Hyponatremia  2. Chronic insomnia  -     diazePAM (VALIUM) 2 mg tablet; TAKE 1/2 TO 1 TABLET BY MOUTH EVERY 12 HOURS AS NEEDED FOR ANXIETY. MAX DAILY AMOUNT: 4 MG, Normal, Disp-45 Tablet, R-2  3. Other chronic pain  -     diazePAM (VALIUM) 2 mg tablet; TAKE 1/2 TO 1 TABLET BY MOUTH EVERY 12 HOURS AS NEEDED FOR ANXIETY. MAX DAILY AMOUNT: 4 MG, Normal, Disp-45 Tablet, R-2  4. Essential hypertension  -     amLODIPine (Norvasc) 5 mg tablet; Take 1 Tablet by mouth daily., Normal, Disp-90 Tablet, R-1  5. PAD (peripheral artery disease) (HCC)  6. Hypercholesterolemia  7. Vitamin D deficiency, unspecified  8. Medicare annual wellness visit, subsequent       Depression Risk Factor Screening     3 most recent PHQ Screens 12/22/2020   Little interest or pleasure in doing things Not at all   Feeling down, depressed, irritable, or hopeless Not at all   Total Score PHQ 2 0       Alcohol & Drug Abuse Risk Screen    Do you average more than 1 drink per night or more than 7 drinks a week:   No    On any one occasion in the past three months have you have had more than 3 drinks containing alcohol:  No          Functional Ability and Level of Safety    Hearing: Hearing is good.      Activities of Daily Living:  The home contains: handrails and grab bars  Patient does total self care      Ambulation: with no difficulty     Fall Risk:  Fall Risk Assessment, last 12 mths 12/22/2020   Able to walk? Yes   Fall in past 12 months? 0   Do you feel unsteady? 0   Are you worried about falling 0   Is TUG test greater than 12 seconds? -   Is the gait abnormal? -   Number of falls in past 12 months -   Fall with injury? -      Abuse Screen:  Patient is not abused       Cognitive Screening    Has your family/caregiver stated any concerns about your memory: no     Health Maintenance Due     Health Maintenance Due   Topic Date Due   ??? Bone Densitometry (Dexa) Screening  Never done   ??? Pneumococcal 65+ years (2 - PCV) 08/13/2009   ??? DTaP/Tdap/Td series (1 - Tdap) 01/06/2017       Patient Care Team   Patient Care Team:  Willamae Demby, Sheron Nightingale, MD as PCP - General (Family Medicine)  Ninoska Goswick, Sheron Nightingale, MD as PCP - [facility] Empaneled Provider    History     Patient Active Problem List   Diagnosis Code   ??? 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 Carotid stenosis I65.29   ??? 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 Chronic back pain greater than 3 months duration M54.9, G89.29   ??? Chronic insomnia F51.04   ??? 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 Essential hypertension I10   ??? Gait instability R26.81   ??? Hypercholesterolemia E78.00   ??? Hyponatremia E87.1   ??? Injury of left knee S89.92XA   ??? Lower extremity edema R60.0   ??? 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   ??? 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 Obstructive  sleep apnea G47.33   ??? 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 PAD (peripheral artery disease) (HCC) I73.9   ??? 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 Raynaud phenomenon I73.00   ??? 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 Situational anxiety  F41.8   ??? Vaginitis, atrophic N95.2   ??? Vertigo R42   ??? Other chronic pain G89.29     Past Medical History:   Diagnosis Date   ??? Colon polyps    ??? Diastolic dysfunction without heart failure    ??? Hypercholesterolemia    ??? Hypertension    ??? Hyponatremia    ??? Insomnia    ??? Meniere disease, unspecified laterality    ??? PAD (peripheral artery disease) (HCC)    ??? Situational anxiety    ??? Stenosis of right carotid artery    ??? Thyroid disease       Past Surgical History:   Procedure Laterality Date   ??? HX CHOLECYSTECTOMY  2004   ??? HX ORTHOPAEDIC       Current Outpatient Medications   Medication Sig Dispense Refill   ??? diazePAM (VALIUM) 2 mg tablet TAKE 1/2 TO 1 TABLET BY MOUTH EVERY 12 HOURS AS NEEDED FOR ANXIETY. MAX DAILY AMOUNT: 4 MG 45 Tablet 2   ??? amLODIPine (Norvasc) 5 mg tablet Take 1 Tablet by mouth daily. 90 Tablet 1   ??? celecoxib (CELEBREX) 100 mg capsule Take 1 Capsule by mouth daily.  0   ??? loratadine (Claritin) 10 mg tablet Take 10 mg by mouth.     ??? valsartan (DIOVAN) 160 mg tablet Take 1 Tablet by mouth daily. 90 Tablet 3   ??? pantoprazole (PROTONIX) 40 mg tablet TAKE 1 TABLET BY MOUTH  TWICE DAILY 180 Tablet 3   ??? cholecalciferol (VITAMIN D3) (2,000 UNITS /50 MCG) cap capsule Take 1 Cap by mouth daily. 90 Cap 3   ??? aspirin delayed-release 81 mg tablet Take 81 mg by mouth daily.     ??? loratadine (CLARITIN) 10 mg tablet Take 10 mg by mouth daily.     ??? red yeast rice extract 600 mg cap Take 4 Caps by mouth daily.     ??? olopatadine (PATADAY) 0.2 % drop ophthalmic solution Administer 1 Drop to both eyes daily as needed.       Allergies   Allergen Reactions   ??? Ace Inhibitors Swelling     REACTION: swelling   ??? Beta-Blockers (Beta-Adrenergic Blocking Agts) Palpitations     REACTION: swelling   ??? Neomycin Swelling   ??? Statins-Hmg-Coa Reductase Inhibitors Swelling     REACTION: myalgia   ??? Tramadol Swelling     Does not want to take again.????  Constipation, decreased appetite, hot/chills, did not do well with it        Family History   Problem Relation Age of Onset   ??? Cancer Mother    ??? Heart Disease Father      Social History     Tobacco Use   ??? Smoking status: Never Smoker   ??? Smokeless tobacco: Never Used   Substance Use Topics   ??? Alcohol use: Never         Sheron Nightingale Treyvonne Tata, MD

## 2020-12-22 NOTE — Progress Notes (Signed)
1. Have you been to the ER, urgent care clinic since your last visit?  Hospitalized since your last visit?No    2. Have you seen or consulted any other health care providers outside of the [facility] since your last visit?  Include any pap smears or colon screening. No    Health Maintenance Due   Topic Date Due   . Bone Densitometry (Dexa) Screening  Never done   . Pneumococcal 65+ years (2 - PCV) 08/13/2009   . DTaP/Tdap/Td series (1 - Tdap) 01/06/2017   . Medicare Yearly Exam  12/18/2020     Chief Complaint   Patient presents with   . Annual Wellness Visit

## 2021-01-07 MED ORDER — PANTOPRAZOLE 40 MG TAB, DELAYED RELEASE
40 mg | ORAL_TABLET | ORAL | 3 refills | Status: DC
Start: 2021-01-07 — End: 2021-11-24

## 2021-02-07 ENCOUNTER — Telehealth: Payer: Self-pay | Admitting: Internal Medicine

## 2021-02-07 NOTE — Telephone Encounter (Signed)
I have called the pt and she stated that this appt will be for her yearly follow up and to get her cpap supplies renewed.  She stated that she has been doing well and not having any issues with her cpap.  She stated that her last report was excellent.  The number listed in the chart is the best number to reach her.

## 2021-02-07 NOTE — Telephone Encounter (Signed)
Dr. Mortimer Fries please advise if you are willing to see the pt via televist in July for her follow up appt.  thanks

## 2021-02-28 ENCOUNTER — Telehealth: Attending: Family Medicine | Primary: Family Medicine

## 2021-02-28 ENCOUNTER — Telehealth: Admit: 2021-02-28 | Discharge: 2021-02-28 | Payer: MEDICARE | Attending: Family Medicine | Primary: Family Medicine

## 2021-02-28 DIAGNOSIS — I10 Essential (primary) hypertension: Secondary | ICD-10-CM

## 2021-02-28 NOTE — Progress Notes (Signed)
 1. Have you been to the ER, urgent care clinic since your last visit?  Hospitalized since your last visit?No    2. Have you seen or consulted any other health care providers outside of the [facility] since your last visit?  Include any pap smears or colon screening. Yes, Cardiology, 02/22/21    Health Maintenance Due   Topic Date Due   . Shingrix Vaccine Age 82> (1 of 2) Never done   . Bone Densitometry (Dexa) Screening  Never done   . Pneumococcal 65+ years (2 - PCV) 08/13/2009     Chief Complaint   Patient presents with   . Hypertension   . Medication Evaluation

## 2021-02-28 NOTE — Progress Notes (Signed)
Chief Complaint   Patient presents with   ??? Hypertension   ??? Medication Evaluation     Pt was seen via virtual video visit.     She reports that her cardiologist wanted to increase her Bp medication from 160 to 320. Pt did not feel comfortable with that increase so she started taking her 160 tablets with old 80mg  tablets for a total of 240.     She reports that with this dose she feels dizzy, lightheaded and weak.     Pt has been checking her BP at home, 122/67, 123/75.     BP at the cardiologist was 141/70, not repeated, pt reports that it was after exerting herself.     Shamra Bradeen is a 80 y.o. female who was seen by synchronous (real-time) audio-video technology on 02/28/2021 for Hypertension and Medication Evaluation        Assessment & Plan:   Diagnoses and all orders for this visit:    1. Essential hypertension    2. Primary hypertension    Reduce back to 160mg  of the valsartan, Pt feels too poorly to leave her home, was participating in exercise before medication change     -we will leave the amlodipine at 5 mg daily as reduced by cardiology  -drop valsartan back to 160 from the 240 recommended.   -continue to monitor BP at home, if levels increase to >140/90 or current symptoms do not resolve then further eval is needed    Subjective:       Prior to Admission medications    Medication Sig Start Date End Date Taking? Authorizing Provider   amLODIPine (Norvasc) 5 mg tablet Take 1 Tablet by mouth daily. 02/28/21  Yes Kennen Stammer, , MD   valsartan (DIOVAN) 160 mg tablet Take 1 Tablet by mouth daily. 02/28/21  Yes Genetta Fiero, Sheron Nightingale, MD   pantoprazole (PROTONIX) 40 mg tablet TAKE 1 TABLET BY MOUTH  TWICE DAILY 01/07/21  Yes Jhon Mallozzi, Sheron Nightingale, MD   diazePAM (VALIUM) 2 mg tablet TAKE 1/2 TO 1 TABLET BY MOUTH EVERY 12 HOURS AS NEEDED FOR ANXIETY. MAX DAILY AMOUNT: 4 MG 12/22/20  Yes Chandrika Sandles, Sheron Nightingale, MD   celecoxib (CELEBREX) 100 mg capsule Take 1 Capsule by mouth daily. 08/26/20  Yes Alisson Rozell,  Sheron Nightingale, MD   loratadine (Claritin) 10 mg tablet Take 10 mg by mouth.   Yes Provider, Historical   cholecalciferol (VITAMIN D3) (2,000 UNITS /50 MCG) cap capsule Take 1 Cap by mouth daily. 12/31/19  Yes Lainee Lehrman, Sheron Nightingale, MD   aspirin delayed-release 81 mg tablet Take 81 mg by mouth daily.   Yes Provider, Historical   loratadine (CLARITIN) 10 mg tablet Take 10 mg by mouth daily.   Yes Provider, Historical   red yeast rice extract 600 mg cap Take 4 Caps by mouth daily.   Yes Provider, Historical   olopatadine (PATADAY) 0.2 % drop ophthalmic solution Administer 1 Drop to both eyes daily as needed.   Yes Provider, Historical   amLODIPine (Norvasc) 10 mg tablet Take 1 Tablet by mouth daily.  Patient taking differently: Take 5 mg by mouth daily. 12/22/20 02/28/21  Talvin Christianson, 12/24/20, MD   valsartan (DIOVAN) 160 mg tablet Take 1 Tablet by mouth daily.  Patient taking differently: Take 240 mg by mouth daily. 06/21/20 02/28/21  Vivyan Biggers, 06/23/20, MD     Allergies   Allergen Reactions   ??? Ace Inhibitors Swelling     REACTION: swelling   ??? Beta-Blockers (  Beta-Adrenergic Blocking Agts) Palpitations     REACTION: swelling   ??? Neomycin Swelling   ??? Statins-Hmg-Coa Reductase Inhibitors Swelling     REACTION: myalgia   ??? Tramadol Swelling     Does not want to take again.????  Constipation, decreased appetite, hot/chills, did not do well with it       ROS    Objective:     Patient-Reported Vitals 02/28/2021   Patient-Reported Pulse -   Patient-Reported Systolic  141   Patient-Reported Diastolic 70        [INSTRUCTIONS:  "[x] " Indicates a positive item  "[] " Indicates a negative item  -- DELETE ALL ITEMS NOT EXAMINED]    Constitutional: [x]  Appears well-developed and well-nourished [x]  No apparent distress      []  Abnormal -     Mental status: [x]  Alert and awake  [x]  Oriented to person/place/time [x]  Able to follow commands    []  Abnormal -     Eyes:   EOM    [x]   Normal    []  Abnormal -   Sclera  [x]   Normal     []  Abnormal -          Discharge [x]   None visible   []  Abnormal -     HENT: [x]  Normocephalic, atraumatic  []  Abnormal -   [x]  Mouth/Throat: Mucous membranes are moist    External Ears [x]  Normal  []  Abnormal -    Neck: [x]  No visualized mass []  Abnormal -     Pulmonary/Chest: [x]  Respiratory effort normal   [x]  No visualized signs of difficulty breathing or respiratory distress        []  Abnormal -      Musculoskeletal:   [x]  Normal gait with no signs of ataxia         [x]  Normal range of motion of neck        []  Abnormal -     Neurological:        [x]  No Facial Asymmetry (Cranial nerve 7 motor function) (limited exam due to video visit)          [x]  No gaze palsy        []  Abnormal -          Skin:        [x]  No significant exanthematous lesions or discoloration noted on facial skin         []  Abnormal -            Psychiatric:       [x]  Normal Affect []  Abnormal -        [x]  No Hallucinations    Other pertinent observable physical exam findings:-        We discussed the expected course, resolution and complications of the diagnosis(es) in detail.  Medication risks, benefits, costs, interactions, and alternatives were discussed as indicated.  I advised her to contact the office if her condition worsens, changes or fails to improve as anticipated. She expressed understanding with the diagnosis(es) and plan.       , was evaluated through a synchronous (real-time) audio-video encounter. The patient (or guardian if applicable) is aware that this is a billable service, which includes applicable co-pays. This Virtual Visit was conducted with patient's (and/or legal guardian's) consent. The visit was conducted pursuant to the emergency declaration under the and the , 1135 waiver authority and the and Act.  Patient identification was verified,  and a caregiver was present when appropriate.  The patient was  located at: Home: [address]  [place] [address]  The provider was located at: Facility (Appt Department): [address]  [address]  [place] [address]        Baron Sane, MD

## 2021-03-14 ENCOUNTER — Institutional Professional Consult (permissible substitution): Admit: 2021-03-14 | Discharge: 2021-03-14 | Payer: MEDICARE | Primary: Family Medicine

## 2021-03-14 DIAGNOSIS — I10 Essential (primary) hypertension: Secondary | ICD-10-CM

## 2021-03-14 NOTE — Progress Notes (Signed)
 Chief Complaint   Patient presents with   . Blood Pressure Check     Health Maintenance reviewed     1. Have you been to the ER, urgent care clinic since your last visit?  Hospitalized since your last visit? No     2. Have you seen or consulted any other health care providers outside of the [facility] since your last visit?  Include any pap smears or colon screening.  No     Per Dr. Hallie since her at home readings are within normal range. Pt was told to stay on her current medications and follow-up in 2 months

## 2021-03-15 ENCOUNTER — Telehealth: Payer: Self-pay | Admitting: *Deleted

## 2021-03-15 NOTE — Telephone Encounter (Signed)
Called and spoke with patient,she states she has not received her new CPAP machine yet and continues to use her old one every night and when she naps.  States she gets a good nights sleep.  Asked if she could take her machine to the DME company she uses so they could get a download for [place] and she stated she couldn't.  She said we monitor it from our office.  I let her know that the 3G tower went down in April and we are unable to monitor it at this time.  Advised her I would make Dr. Mortimer Fries aware.  Nothing further needed.

## 2021-03-16 ENCOUNTER — Other Ambulatory Visit: Payer: Self-pay

## 2021-03-16 ENCOUNTER — Ambulatory Visit (INDEPENDENT_AMBULATORY_CARE_PROVIDER_SITE_OTHER): Payer: Medicare Other | Admitting: Internal Medicine

## 2021-03-16 ENCOUNTER — Encounter: Payer: Self-pay | Admitting: Internal Medicine

## 2021-03-16 DIAGNOSIS — G4733 Obstructive sleep apnea (adult) (pediatric): Secondary | ICD-10-CM | POA: Diagnosis not present

## 2021-03-16 NOTE — Patient Instructions (Signed)
Continue CPAP as prescribed. 

## 2021-03-16 NOTE — Progress Notes (Signed)
* [facility]    I connected with the patient by telephone enabled telemedicine visit and verified that I am speaking with the correct person using two identifiers.    I discussed the limitations, risks, security and privacy concerns of performing an evaluation and management service by telemedicine and the availability of in-person appointments. I also discussed with the patient that there may be a patient responsible charge related to this service. The patient expressed understanding and agreed to proceed.  PATIENT AGREES AND CONFIRMS -YES   Other persons participating in the visit and their role in the encounter: Patient, nursing  This visit type was conducted due to national recommendations for restrictions regarding the COVID-19 Pandemic (e.g. social distancing).  This format is felt to be most appropriate for this patient at this time.  All issues noted in this document were discussed and addressed.       [email or web address] , [email or web address] .   Date: 03/16/2021  MRN# [id number] [name] [name] [birth year]   CC Follow up OSA   HPI:  Overall doing well with CPAP Very good compliance Reports reviewed with patient in detail  Seems like there is no evidence of acute heart failure No exacerbation at this time No evidence of heart failure at this time No evidence or signs of infection at this time No respiratory distress No fevers, chills, nausea, vomiting, diarrhea No evidence of lower extremity edema No evidence hemoptysis  she feels well rested  Refreshed sleep     She is taking astelin one spray in each nostril every night, she also takes claritin every day.   **CPAP 12/2020 Excellent compliance AHI reduced 0.6   **CPAP download 01/2020 100% compliance days and >4 hrs AHI down to 0.4   **CPAP download 01/26/25/23/20>> raw data personally reviewed.  Usage greater than 4 hours is 30/30 days.  Average usage on days used 9 hours 6 minutes.  Pressure range  5-15.  Median pressure 9, 95 percentile pressure 13, max pressure 14.3.  Residual AHI 0.6.  Overall this shows excellent compliance with CPAP with excellent control of obstructive sleep apnea. **Download 30 days as or 02/18/18; data personally reviewed. Usage great than 4 hours is 28/30 days. Avg usage on days used is 8 hours and 29 minutes. Median pressure is 9, 95th percentile is 12, max pressure is 14.   Review of download data for 30 days shows average usage of 8 hours 35 minutes, CPAP, his AutoSet 5-15. 95th percentile pressure was 13.6. Review graphic data shows a pressure range between 12 and 14. Residual AHI is 0.5     Medication:   Outpatient Encounter Medications as of 03/16/2021  Medication Sig   aspirin 81 MG tablet Take 81 mg by mouth daily.     azelastine (ASTELIN) 0.1 % nasal spray U 2 SPRAYS IEN Q 12 H.   celecoxib (CELEBREX) 200 MG capsule TAKE 1 CAPSULE(200 MG) BY MOUTH TWICE DAILY   DIOVAN 40 MG tablet TAKE 3 TABLETS BY MOUTH EVERY DAY   furosemide (LASIX) 20 MG tablet Take 0.5 tablets (10 mg total) by mouth daily. For 3 days and as needed   hydrochlorothiazide (HYDRODIURIL) 25 MG tablet TAKE 1 TABLET(25 MG) BY MOUTH DAILY AS NEEDED   loratadine (CLARITIN) 10 MG tablet Take 10 mg by mouth daily.   NORVASC 10 MG tablet Take 1 tablet (10 mg total) by mouth daily.   pantoprazole (PROTONIX) 40 MG tablet TAKE 1 TABLET BY MOUTH TWICE DAILY   predniSONE (STERAPRED UNI-PAK 21  TAB) 10 MG (21) TBPK tablet TK PO UTD   Red Yeast Rice 600 MG CAPS Take 4 capsules by mouth daily.    VALIUM 2 MG tablet TAKE 1/2 TO 1 TABLET BY MOUTH EVERY 12 HOURS   No facility-administered encounter medications on file as of 03/16/2021.     Allergies:  Ace inhibitors, Neosporin [neomycin-bacitracin zn-polymyx], Statins, Tramadol, and Beta adrenergic blockers   Review of Systems:  Gen:  Denies  fever, sweats, chills weight loss  HEENT: Denies blurred vision, double vision, ear pain, eye pain, hearing  loss, nose bleeds, sore throat Cardiac:  No dizziness, chest pain or heaviness, chest tightness,edema, No JVD Resp:   No cough, -sputum production, -shortness of breath,-wheezing, -hemoptysis,  Gi: Denies swallowing difficulty, stomach pain, nausea or vomiting, diarrhea, constipation, bowel incontinence Gu:  Denies bladder incontinence, burning urine Ext:   Denies Joint pain, stiffness or swelling Skin: Denies  skin rash, easy bruising or bleeding or hives Endoc:  Denies polyuria, polydipsia , polyphagia or weight change Psych:   Denies depression, insomnia or hallucinations  Other:  All other systems negative     A/P  80 year old pleasant white female follow-up visit for assessment for sleep apnea Overall patient is doing extremely well with compliance patient uses and benefits from therapy   [facility] April compliance report shows excellent compliance at 100% AHI reduced to 0.6 Auto CPAP 5-15   Follow up in 1 year   MEDICATION ADJUSTMENTS/LABS AND TESTS ORDERED: Continue AUTOCPAP as prescibed CURRENT MEDICATIONS REVIEWED AT LENGTH WITH PATIENT TODAY   Patient  satisfied with Plan of action and management. All questions answered  Follow up 1 year  Total Time spent 12 mins  Clark Cuff Patricia Pesa, M.D.  [facility]  Medical Director [place] Director [facility] Cardio-Pulmonary Department

## 2021-04-08 ENCOUNTER — Encounter

## 2021-04-08 NOTE — Telephone Encounter (Signed)
Refill Request ([facility] sent a fax)     Requested Prescriptions     Pending Prescriptions Disp Refills    valsartan (DIOVAN) 160 mg tablet 90 Tablet 3     Sig: Take 1 Tablet by mouth in the morning.       Thank You

## 2021-04-09 MED ORDER — VALSARTAN 160 MG TAB
160 mg | ORAL_TABLET | Freq: Every day | ORAL | 3 refills | Status: AC
Start: 2021-04-09 — End: ?

## 2021-05-17 ENCOUNTER — Ambulatory Visit: Attending: Family Medicine | Primary: Family Medicine

## 2021-05-17 ENCOUNTER — Ambulatory Visit: Admit: 2021-05-17 | Discharge: 2021-05-17 | Payer: MEDICARE | Attending: Family Medicine | Primary: Family Medicine

## 2021-05-17 DIAGNOSIS — R2681 Unsteadiness on feet: Secondary | ICD-10-CM

## 2021-05-17 MED ORDER — AMLODIPINE 10 MG TAB
10 mg | ORAL_TABLET | Freq: Every day | ORAL | 1 refills | Status: DC
Start: 2021-05-17 — End: 2021-09-18

## 2021-05-17 NOTE — Progress Notes (Signed)
 Chief Complaint   Patient presents with    Blood Pressure Check     2 month follow up     Health Maintenance Due   Topic Date Due    Shingrix Vaccine Age 82> (1 of 2) Never done    Bone Densitometry (Dexa) Screening  Never done    Pneumococcal 65+ years (2 - PCV) 08/13/2009    COVID-19 Vaccine (4 - Booster for Pfizer series) 11/03/2020    Flu Vaccine (1) 05/05/2021     1. Have you been to the ER, urgent care clinic since your last visit?  Hospitalized since your last visit? No    2. Have you seen or consulted any other health care providers outside of the [facility] since your last visit? No     3. For patients aged 98-75: Has the patient had a colonoscopy / FIT/ Cologuard? NA - based on age      If the patient is female:    4. For patients aged 48-74: Has the patient had a mammogram within the past 2 years? NA - based on age or sex      75. For patients aged 21-65: Has the patient had a pap smear? NA - based on age or sex

## 2021-05-17 NOTE — Progress Notes (Signed)
Chief Complaint   Patient presents with    Blood Pressure Check     2 month follow up     Pt reports that she stopped Celebrex, noticed an improvement in the swelling in her ankles, feels better overall.     Pt is taking amlodipine and valsartan as written.     She has been checking her Bp at home ranges from 126-145/66-82.     Pt uses a CPAP nightly.     Pt reports that she gets dizzy when her BP is high, never had lows. Would like to resume increased amlodipine since swelling resolved with discontinuing celebrex.       Subjective: (As above and below)     Chief Complaint   Patient presents with    Blood Pressure Check     2 month follow up     she is a 80 y.o. year old female who presents for evaluation.    Reviewed PmHx, RxHx, FmHx, SocHx, AllgHx and updated in chart.    Review of Systems - negative except as listed above    Objective:     Vitals:    05/17/21 1020   BP: (!) 145/75   Pulse: 82   Resp: 16   SpO2: 99%   Weight: 167 lb 9.6 oz (76 kg)   Height: 5' 2.5" (1.588 m)     Physical Examination: General appearance - alert, well appearing, and in no distress  Mental status - alert, oriented to person, place, and time, normal mood, behavior, speech, dress, motor activity, and thought processes  Chest - clear to auscultation, no wheezes, rales or rhonchi, symmetric air entry  Heart - normal rate, regular rhythm, normal S1, S2, no murmurs, rubs, clicks or gallops  Musculoskeletal - no joint tenderness, deformity or swelling  Extremities - peripheral pulses normal, no pedal edema, no clubbing or cyanosis    Assessment/ Plan:   1. Essential hypertension  -swelling resolved, will increase amlodipine dose  -monitor for recurrence and home blood pressure readings  - amLODIPine (NORVASC) 10 mg tablet; Take 1 Tablet by mouth daily.  Dispense: 90 Tablet; Refill: 1     2. Gait instability  -refer back to PT  - REFERRAL TO PHYSICAL THERAPY    I have discussed the diagnosis with the patient and the intended plan as seen in  the above orders.  The patient has received an after-visit summary and questions were answered concerning future plans.     Medication Side Effects and Warnings were discussed with patient: yes  Patient Labs were reviewed: yes  Patient Past Records were reviewed:  yes    Pat Patrick, M.D.

## 2021-05-24 NOTE — Telephone Encounter (Signed)
Patient has a referral for physical therapy, but says [facility] never received the order. Patient wanted to follow up.

## 2021-05-25 NOTE — Telephone Encounter (Signed)
PT referral has been faxed over to [facility].

## 2021-05-25 NOTE — Telephone Encounter (Signed)
Called [facility] to see if they have received the fax with the referral for patient on 9/20. [facility] stated they did received the fax and have already called the patient and set up an appointment for Friday 9/23 for evaluation.

## 2021-05-25 NOTE — Telephone Encounter (Signed)
-----   Message from Leroy Libman sent at 05/24/2021  1:30 PM EDT -----  Subject: Message to Provider    QUESTIONS  Information for Provider? Pt sees Andreya B Risser at [facility] [address].   Kendal Hymen from [facility] called today 05/24/21 need a RX   for PT faxed to them for patients back. Please fax to [phone] Phone   [phone]. Thanks   ---------------------------------------------------------------------------  --------------  Cleotis Lema INFO  [phone]; OK to leave message on voicemail  ---------------------------------------------------------------------------  --------------  SCRIPT ANSWERS  Relationship to Patient? Third Party  Third Party Type? Occupational Therapy Office?   Representative Name? Kendal Hymen

## 2021-06-06 ENCOUNTER — Encounter

## 2021-06-06 MED ORDER — DIAZEPAM 2 MG TAB
2 mg | ORAL_TABLET | ORAL | 2 refills | Status: DC
Start: 2021-06-06 — End: 2021-10-17

## 2021-06-15 ENCOUNTER — Ambulatory Visit: Admit: 2021-06-15 | Discharge: 2021-06-15 | Payer: MEDICARE | Primary: Family Medicine

## 2021-06-15 DIAGNOSIS — I10 Essential (primary) hypertension: Secondary | ICD-10-CM

## 2021-06-15 NOTE — Addendum Note (Signed)
Addendum  Note by Faythe Casa at 06/15/21 0900                Author: Faythe Casa  Service: --  Author Type: Technician       Filed: 06/15/21 0919  Encounter Date: 06/15/2021  Status: Signed          Editor: Faythe Casa (Technician)          Addended by: Faythe Casa on: 06/15/2021 09:19 AM    Modules accepted: Orders

## 2021-06-15 NOTE — Progress Notes (Signed)
No additional comment

## 2021-06-16 LAB — METABOLIC PANEL, COMPREHENSIVE
A-G Ratio: 1.5 (ref 1.1–2.2)
ALT (SGPT): 42 U/L (ref 12–78)
AST (SGOT): 21 U/L (ref 15–37)
Albumin: 4.8 g/dL (ref 3.5–5.0)
Alk. phosphatase: 116 U/L (ref 45–117)
Anion gap: 7 mmol/L (ref 5–15)
BUN/Creatinine ratio: 24 — ABNORMAL HIGH (ref 12–20)
BUN: 23 MG/DL — ABNORMAL HIGH (ref 6–20)
Bilirubin, total: 0.7 MG/DL (ref 0.2–1.0)
CO2: 26 mmol/L (ref 21–32)
Calcium: 10.3 MG/DL — ABNORMAL HIGH (ref 8.5–10.1)
Chloride: 101 mmol/L (ref 97–108)
Creatinine: 0.97 MG/DL (ref 0.55–1.02)
Globulin: 3.3 g/dL (ref 2.0–4.0)
Glucose: 103 mg/dL — ABNORMAL HIGH (ref 65–100)
Potassium: 4.4 mmol/L (ref 3.5–5.1)
Protein, total: 8.1 g/dL (ref 6.4–8.2)
Sodium: 134 mmol/L — ABNORMAL LOW (ref 136–145)
eGFR: 59 mL/min/{1.73_m2} — ABNORMAL LOW (ref >60)

## 2021-06-16 LAB — CBC W/O DIFF
ABSOLUTE NRBC: 0 10*3/uL (ref 0.00–0.01)
HCT: 48.2 % — ABNORMAL HIGH (ref 35.0–47.0)
HGB: 15.7 g/dL (ref 11.5–16.0)
MCH: 30.5 PG (ref 26.0–34.0)
MCHC: 32.6 g/dL (ref 30.0–36.5)
MCV: 93.6 FL (ref 80.0–99.0)
MPV: 8.9 FL (ref 8.9–12.9)
NRBC: 0 PER 100 WBC
PLATELET: 339 10*3/uL (ref 150–400)
RBC: 5.15 M/uL (ref 3.80–5.20)
RDW: 13.5 % (ref 11.5–14.5)
WBC: 7.2 10*3/uL (ref 3.6–11.0)

## 2021-06-16 LAB — LIPID PANEL
CHOL/HDL Ratio: 2.7 (ref 0.0–5.0)
Chol/HDL Ratio: 2.7 (ref 0.0–5.0)
Cholesterol, Total: 235 MG/DL — ABNORMAL HIGH (ref <200)
Cholesterol, total: 235 MG/DL — ABNORMAL HIGH (ref <200)
HDL Cholesterol: 87 MG/DL
HDL: 87 MG/DL
LDL Calculated: 106.4 MG/DL — ABNORMAL HIGH (ref 0–100)
LDL, calculated: 106.4 MG/DL — ABNORMAL HIGH (ref 0–100)
Triglyceride: 208 MG/DL — ABNORMAL HIGH (ref <150)
Triglycerides: 208 MG/DL — ABNORMAL HIGH (ref <150)
VLDL Cholesterol Calculated: 41.6 MG/DL
VLDL, calculated: 41.6 MG/DL

## 2021-06-16 LAB — TSH 3RD GENERATION
TSH: 1.1 u[IU]/mL (ref 0.36–3.74)
TSH: 1.1 u[IU]/mL (ref 0.36–3.74)

## 2021-06-16 LAB — HEMOGLOBIN A1C WITH EAG
Est. average glucose: 114 mg/dL
Hemoglobin A1c: 5.6 % (ref 4.0–5.6)

## 2021-06-16 LAB — VITAMIN D, 25 HYDROXY: Vitamin D 25-Hydroxy: 38.6 ng/mL (ref 30–100)

## 2021-06-16 LAB — HEMOGLOBIN A1C W/EAG
Hemoglobin A1C: 5.6 % (ref 4.0–5.6)
eAG: 114 mg/dL

## 2021-06-16 LAB — COMPREHENSIVE METABOLIC PANEL
ALT: 42 U/L (ref 12–78)
AST: 21 U/L (ref 15–37)
Albumin/Globulin Ratio: 1.5 (ref 1.1–2.2)
Albumin: 4.8 g/dL (ref 3.5–5.0)
Alkaline Phosphatase: 116 U/L (ref 45–117)
Anion Gap: 7 mmol/L (ref 5–15)
BUN: 23 MG/DL — ABNORMAL HIGH (ref 6–20)
Bun/Cre Ratio: 24 — ABNORMAL HIGH (ref 12–20)
CO2: 26 mmol/L (ref 21–32)
Calcium: 10.3 MG/DL — ABNORMAL HIGH (ref 8.5–10.1)
Chloride: 101 mmol/L (ref 97–108)
Creatinine: 0.97 MG/DL (ref 0.55–1.02)
ESTIMATED GLOMERULAR FILTRATION RATE: 59 mL/min/{1.73_m2} — ABNORMAL LOW (ref >60)
Globulin: 3.3 g/dL (ref 2.0–4.0)
Glucose: 103 mg/dL — ABNORMAL HIGH (ref 65–100)
Potassium: 4.4 mmol/L (ref 3.5–5.1)
Sodium: 134 mmol/L — ABNORMAL LOW (ref 136–145)
Total Bilirubin: 0.7 MG/DL (ref 0.2–1.0)
Total Protein: 8.1 g/dL (ref 6.4–8.2)

## 2021-06-16 LAB — CBC
Hematocrit: 48.2 % — ABNORMAL HIGH (ref 35.0–47.0)
Hemoglobin: 15.7 g/dL (ref 11.5–16.0)
MCH: 30.5 PG (ref 26.0–34.0)
MCHC: 32.6 g/dL (ref 30.0–36.5)
MCV: 93.6 FL (ref 80.0–99.0)
MPV: 8.9 FL (ref 8.9–12.9)
NRBC Absolute: 0 10*3/uL (ref 0.00–0.01)
Nucleated RBCs: 0 PER 100 WBC
Platelets: 339 10*3/uL (ref 150–400)
RBC: 5.15 M/uL (ref 3.80–5.20)
RDW: 13.5 % (ref 11.5–14.5)
WBC: 7.2 10*3/uL (ref 3.6–11.0)

## 2021-06-16 LAB — VITAMIN D 25 HYDROXY: Vit D, 25-Hydroxy: 38.6 ng/mL (ref 30–100)

## 2021-06-22 ENCOUNTER — Ambulatory Visit: Attending: Family Medicine | Primary: Family Medicine

## 2021-06-22 ENCOUNTER — Ambulatory Visit: Admit: 2021-06-22 | Discharge: 2021-06-22 | Payer: MEDICARE | Attending: Family Medicine | Primary: Family Medicine

## 2021-06-22 DIAGNOSIS — Z23 Encounter for immunization: Secondary | ICD-10-CM

## 2021-06-22 NOTE — Progress Notes (Signed)
Chief Complaint   Patient presents with    Hypertension     Follow up     Pt reports that her BP has been well controlled at home, she "can feel it go up when she arrives"    Pt is here to recheck calcium and sodium.     Pt is getting COVID booster on Saturday, patient would also like a flu shot today.     Energy level has been better.     Still having some back issues, walks with a walker.     Subjective: (As above and below)     Chief Complaint   Patient presents with    Hypertension     Follow up     she is a 79 y.o. year old female who presents for evaluation.    Reviewed PmHx, RxHx, FmHx, SocHx, AllgHx and updated in chart.    Review of Systems - negative except as listed above    Objective:     Vitals:    06/22/21 1048   BP: (!) 165/83   Pulse: 89   Resp: 16   Weight: 171 lb 9.6 oz (77.8 kg)   Height: 5' 2.5" (1.588 m)     Physical Examination: General appearance - alert, well appearing, and in no distress  Mental status - normal mood, behavior, speech, dress, motor activity, and thought processes  Ears - bilateral TM's and external ear canals normal  Chest - clear to auscultation, no wheezes, rales or rhonchi, symmetric air entry  Heart - normal rate, regular rhythm, normal S1, S2, no murmurs, rubs, clicks or gallops  Musculoskeletal - no joint tenderness, deformity or swelling  Extremities - peripheral pulses normal, no pedal edema, no clubbing or cyanosis    Assessment/ Plan:   1. Needs flu shot  - INFLUENZA, FLUAD, (AGE 43 Y+), IM, PF, 0.5 ML    2. Serum calcium elevated  -check labs  - METABOLIC PANEL, COMPREHENSIVE; Future  - PTH INTACT; Future    3. Spasm of muscle of lower back  -updated referral   - REFERRAL TO PHYSICAL THERAPY       I have discussed the diagnosis with the patient and the intended plan as seen in the above orders.  The patient has received an after-visit summary and questions were answered concerning future plans.     Medication Side Effects and Warnings were discussed with patient:  yes  Patient Labs were reviewed: yes  Patient Past Records were reviewed:  yes    Pat Patrick, M.D.

## 2021-06-22 NOTE — Progress Notes (Signed)
No additional comment

## 2021-06-22 NOTE — Progress Notes (Signed)
 Chief Complaint   Patient presents with    Hypertension     Follow up       Pt is getting COVID booster on Saturday, patient would also like a flu shot today.     Health Maintenance Due   Topic Date Due    Shingrix Vaccine Age 82> (1 of 2) Never done    Bone Densitometry (Dexa) Screening  Never done    COVID-19 Vaccine (4 - Booster for Pfizer series) 11/03/2020    Flu Vaccine (1) 04/04/2021     1. Have you been to the ER, urgent care clinic since your last visit?  Hospitalized since your last visit? No    2. Have you seen or consulted any other health care providers outside of the [facility] since your last visit? No     3. For patients aged 72-75: Has the patient had a colonoscopy / FIT/ Cologuard? NA - based on age      If the patient is female:    4. For patients aged 46-74: Has the patient had a mammogram within the past 2 years? NA - based on age or sex      54. For patients aged 21-65: Has the patient had a pap smear? NA - based on age or sex

## 2021-06-23 LAB — METABOLIC PANEL, COMPREHENSIVE
A-G Ratio: 1.4 (ref 1.1–2.2)
ALT (SGPT): 44 U/L (ref 12–78)
AST (SGOT): 22 U/L (ref 15–37)
Albumin: 4.8 g/dL (ref 3.5–5.0)
Alk. phosphatase: 123 U/L — ABNORMAL HIGH (ref 45–117)
Anion gap: 8 mmol/L (ref 5–15)
BUN/Creatinine ratio: 21 — ABNORMAL HIGH (ref 12–20)
BUN: 20 MG/DL (ref 6–20)
Bilirubin, total: 0.7 MG/DL (ref 0.2–1.0)
CO2: 26 mmol/L (ref 21–32)
Calcium: 10.1 MG/DL (ref 8.5–10.1)
Chloride: 101 mmol/L (ref 97–108)
Creatinine: 0.94 MG/DL (ref 0.55–1.02)
Globulin: 3.5 g/dL (ref 2.0–4.0)
Glucose: 78 mg/dL (ref 65–100)
Potassium: 4.6 mmol/L (ref 3.5–5.1)
Protein, total: 8.3 g/dL — ABNORMAL HIGH (ref 6.4–8.2)
Sodium: 135 mmol/L — ABNORMAL LOW (ref 136–145)
eGFR: >60 mL/min/{1.73_m2} (ref >60)

## 2021-06-23 LAB — PTH INTACT
Calcium: 10 MG/DL (ref 8.5–10.1)
PTH, Intact: 46.8 pg/mL (ref 18.4–88.0)

## 2021-06-23 LAB — SAMPLES BEING HELD

## 2021-06-23 LAB — COMPREHENSIVE METABOLIC PANEL
ALT: 44 U/L (ref 12–78)
AST: 22 U/L (ref 15–37)
Albumin/Globulin Ratio: 1.4 (ref 1.1–2.2)
Albumin: 4.8 g/dL (ref 3.5–5.0)
Alkaline Phosphatase: 123 U/L — ABNORMAL HIGH (ref 45–117)
Anion Gap: 8 mmol/L (ref 5–15)
BUN: 20 MG/DL (ref 6–20)
Bun/Cre Ratio: 21 — ABNORMAL HIGH (ref 12–20)
CO2: 26 mmol/L (ref 21–32)
Calcium: 10.1 MG/DL (ref 8.5–10.1)
Chloride: 101 mmol/L (ref 97–108)
Creatinine: 0.94 MG/DL (ref 0.55–1.02)
ESTIMATED GLOMERULAR FILTRATION RATE: >60 mL/min/{1.73_m2} (ref >60)
Globulin: 3.5 g/dL (ref 2.0–4.0)
Glucose: 78 mg/dL (ref 65–100)
Potassium: 4.6 mmol/L (ref 3.5–5.1)
Sodium: 135 mmol/L — ABNORMAL LOW (ref 136–145)
Total Bilirubin: 0.7 MG/DL (ref 0.2–1.0)
Total Protein: 8.3 g/dL — ABNORMAL HIGH (ref 6.4–8.2)

## 2021-06-23 LAB — PTH, INTACT
Calcium: 10 MG/DL (ref 8.5–10.1)
PTH: 46.8 pg/mL (ref 18.4–88.0)

## 2021-08-23 ENCOUNTER — Telehealth: Admit: 2021-08-23 | Discharge: 2021-08-23 | Payer: MEDICARE | Attending: Family Medicine | Primary: Family Medicine

## 2021-08-23 DIAGNOSIS — R0981 Nasal congestion: Secondary | ICD-10-CM

## 2021-08-23 MED ORDER — BENZONATATE 200 MG CAP
200 mg | ORAL_CAPSULE | Freq: Three times a day (TID) | ORAL | 0 refills | Status: AC | PRN
Start: 2021-08-23 — End: 2021-08-30

## 2021-08-23 MED ORDER — AZELASTINE 137 MCG NASAL SPRAY AEROSOL
137 mcg (0.1 %) | Freq: Two times a day (BID) | NASAL | 1 refills | Status: DC
Start: 2021-08-23 — End: 2021-12-21

## 2021-08-23 MED ORDER — GUAIFENESIN 600 MG TABLET,EXTENDED RELEASE BIPHASIC 12 HR
600 mg | ORAL_TABLET | Freq: Two times a day (BID) | ORAL | 1 refills | Status: DC
Start: 2021-08-23 — End: 2021-12-21

## 2021-08-23 NOTE — Progress Notes (Signed)
 Room:  Identified pt with two pt identifiers(name and DOB). Reviewed record in preparation for visit and have obtained necessary documentation.  Chief Complaint   Patient presents with    Cough    Nasal Congestion      Pt reports being on CPAP machine. Has to take it off when she coughs.      There were no vitals filed for this visit.    Health Maintenance Due   Topic    COVID-19 Vaccine (4 - Booster for [employer] series)       1. Have you been to the ER, urgent care clinic since your last visit?  Hospitalized since your last visit? No    2. Have you seen or consulted any other health care providers outside of the [facility] since your last visit? No     3. For patients over 45: Has the patient had a colonoscopy? Yes - no Care Gap present     If the patient is female:    4. For patients over 40: Has the patient had a mammogram? Yes - no Care Gap present    5. For patients over 21: Has the patient had a pap smear? Yes - no Care Gap present    Current Outpatient Medications   Medication Instructions    amLODIPine  (NORVASC ) 10 mg, Oral, DAILY    aspirin delayed-release 81 mg, Oral, DAILY    cholecalciferol (VITAMIN D3) 2,000 Units, Oral, DAILY    diazePAM  (VALIUM ) 2 mg tablet TAKE 1/2 TO 1 TABLET BY MOUTH EVERY 12 HOURS AS NEEDED FOR ANXIETY. MAX DAILY AMOUNT: 4 MG    guaiFENesin ER (MUCINEX) 600 mg, Oral, 2 TIMES DAILY    loratadine (CLARITIN) 10 mg, DAILY    olopatadine (PATADAY) 0.2 % drop ophthalmic solution 1 Drop, Both Eyes, DAILY AS NEEDED    pantoprazole  (PROTONIX ) 40 mg tablet TAKE 1 TABLET BY MOUTH  TWICE DAILY    red yeast rice extract 600 mg cap 4 Capsules, Oral, DAILY    valsartan  (DIOVAN ) 160 mg, Oral, DAILY       Allergies   Allergen Reactions    Ace Inhibitors Swelling     REACTION: swelling    Beta-Blockers (Beta-Adrenergic Blocking Agts) Palpitations     REACTION: swelling    Neomycin Swelling    Statins-Hmg-Coa Reductase Inhibitors Swelling     REACTION: myalgia    Tramadol Swelling      Does not want to take again.    Constipation, decreased appetite, hot/chills, did not do well with it       Immunization History   Administered Date(s) Administered     Influenza, FLUZONE (age 9 y+), High Dose 06/21/2020    COVID-19, PFIZER PURPLE top, DILUTE for use, (age 28 y+), IM, 80mcg/0.3mL 11/05/2019, 11/26/2019, 07/06/2020    Influenza Vaccine 08/10/2011, 06/28/2012, 07/30/2015    Influenza Vaccine (Whole Virus) 07/12/2007, 08/11/2009    Influenza, FLUAD, (age 30 y+), Adjuvanted 06/17/2019, 06/22/2021    Influenza, FLUARIX, FLULAVAL, FLUZONE (age 70 mo+) AND AFLURIA, (age 42 y+), PF, 0.94mL 06/17/2013, 07/24/2014, 06/08/2016, 06/21/2017, 07/12/2018    Pneumococcal Polysaccharide (PPSV-23) 08/13/2008    Td, Adsorbed 09/04/1997, 04/04/2009, 04/14/2009, 01/05/2017    Zoster Vaccine, Live 01/27/2008       Past Medical History:   Diagnosis Date    Arthritis     Colon polyps     Diastolic dysfunction without heart failure     Hypercholesterolemia     Hypertension  Hyponatremia     Insomnia     Meniere disease, unspecified laterality     PAD (peripheral artery disease) (HCC)     Situational anxiety     Stenosis of right carotid artery     Thyroid disease

## 2021-08-23 NOTE — Progress Notes (Signed)
Chief Complaint   Patient presents with    Cough    Nasal Congestion     Pt was seen via virtual video visit.     Pt reports that she has been struggling for hacking cough x 4-5 days, developed nasal congestion.   Pt reports that she has been running a low grade temp.     Pt went to her sisters funeral about a week ago, about 100 people.     No body aches.     Pt reports that she is feeling better since it started, feels like she is fighting it off.     Lindsay Berry is a 80 y.o. female who was seen by synchronous (real-time) audio-video technology on 08/23/2021 for Cough and Nasal Congestion        Assessment & Plan:   Diagnoses and all orders for this visit:    1. Nasal congestion  -     azelastine (ASTELIN) 137 mcg (0.1 %) nasal spray; 1 Spray by Both Nostrils route two (2) times a day. Use in each nostril as directed    2. Acute cough    Other orders  -     guaiFENesin ER (Mucinex) 600 mg ER tablet; Take 1 Tablet by mouth two (2) times a day.  -     benzonatate (TESSALON) 200 mg capsule; Take 1 Capsule by mouth three (3) times daily as needed for Cough for up to 7 days.    Advised pt to take medications as written, follow up if not improving or getting worse at any time.     Subjective:       Prior to Admission medications    Medication Sig Start Date End Date Taking? Authorizing Provider   azelastine (ASTELIN) 137 mcg (0.1 %) nasal spray 1 Spray by Both Nostrils route two (2) times a day. Use in each nostril as directed 08/23/21  Yes Makinzi Prieur, Sheron Nightingale, MD   guaiFENesin ER (Mucinex) 600 mg ER tablet Take 1 Tablet by mouth two (2) times a day. 08/23/21  Yes Shadow Stiggers, Sheron Nightingale, MD   benzonatate (TESSALON) 200 mg capsule Take 1 Capsule by mouth three (3) times daily as needed for Cough for up to 7 days. 08/23/21 08/30/21 Yes Srishti Strnad, Sheron Nightingale, MD   diazePAM (VALIUM) 2 mg tablet TAKE 1/2 TO 1 TABLET BY MOUTH EVERY 12 HOURS AS NEEDED FOR ANXIETY. MAX DAILY AMOUNT: 4 MG 06/06/21  Yes Tiye Huwe, Sheron Nightingale, MD   amLODIPine (NORVASC) 10 mg tablet Take 1 Tablet by mouth daily. 05/17/21  Yes Aneesa Romey, Sheron Nightingale, MD   valsartan (DIOVAN) 160 mg tablet Take 1 Tablet by mouth in the morning. 04/08/21  Yes Isrrael Fluckiger, Sheron Nightingale, MD   pantoprazole (PROTONIX) 40 mg tablet TAKE 1 TABLET BY MOUTH  TWICE DAILY 01/07/21  Yes Hodaya Curto, Sheron Nightingale, MD   cholecalciferol (VITAMIN D3) (2,000 UNITS /50 MCG) cap capsule Take 1 Cap by mouth daily. 12/31/19  Yes Giorgi Debruin, Sheron Nightingale, MD   aspirin delayed-release 81 mg tablet Take 81 mg by mouth daily.   Yes Provider, Historical   red yeast rice extract 600 mg cap Take 4 Caps by mouth daily.   Yes Provider, Historical   olopatadine (PATADAY) 0.2 % drop ophthalmic solution Administer 1 Drop to both eyes daily as needed.   Yes Provider, Historical   guaiFENesin ER (Mucinex) 600 mg ER tablet Take 600 mg by mouth two (2) times a day.  08/23/21  Provider, Historical   loratadine (  CLARITIN) 10 mg tablet Take 10 mg by mouth daily.  Patient not taking: Reported on 08/23/2021    Provider, Historical     Allergies   Allergen Reactions    Ace Inhibitors Swelling     REACTION: swelling    Beta-Blockers (Beta-Adrenergic Blocking Agts) Palpitations     REACTION: swelling    Neomycin Swelling    Statins-Hmg-Coa Reductase Inhibitors Swelling     REACTION: myalgia    Tramadol Swelling     Does not want to take again.    Constipation, decreased appetite, hot/chills, did not do well with it       Review of Systems   Constitutional:  Positive for chills and malaise/fatigue.   HENT:  Positive for congestion.    Respiratory:  Positive for cough.      Objective:     Patient-Reported Vitals 08/23/2021   Patient-Reported Pulse 80   Patient-Reported Temperature 98.2   Patient-Reported Systolic  139   Patient-Reported Diastolic 78        [INSTRUCTIONS:  "[x] " Indicates a positive item  "[] " Indicates a negative item  -- DELETE ALL ITEMS NOT EXAMINED]    Constitutional: [x]  Appears well-developed and  well-nourished [x]  No apparent distress      []  Abnormal -     Mental status: [x]  Alert and awake  [x]  Oriented to person/place/time [x]  Able to follow commands    []  Abnormal -     Eyes:   EOM    [x]   Normal    []  Abnormal -   Sclera  [x]   Normal    []  Abnormal -          Discharge [x]   None visible   []  Abnormal -     HENT: [x]  Normocephalic, atraumatic  []  Abnormal -   [x]  Mouth/Throat: Mucous membranes are moist    External Ears [x]  Normal  []  Abnormal -    Neck: [x]  No visualized mass []  Abnormal -     Pulmonary/Chest: [x]  Respiratory effort normal   [x]  No visualized signs of difficulty breathing or respiratory distress        []  Abnormal -      Musculoskeletal:   []  Normal gait with no signs of ataxia         [x]  Normal range of motion of neck        []  Abnormal -     Neurological:        [x]  No Facial Asymmetry (Cranial nerve 7 motor function) (limited exam due to video visit)          [x]  No gaze palsy        []  Abnormal -          Skin:        [x]  No significant exanthematous lesions or discoloration noted on facial skin         []  Abnormal -            Psychiatric:       [x]  Normal Affect []  Abnormal -        [x]  No Hallucinations    Other pertinent observable physical exam findings:-        We discussed the expected course, resolution and complications of the diagnosis(es) in detail.  Medication risks, benefits, costs, interactions, and alternatives were discussed as indicated.  I advised her to contact the office if her condition worsens, changes or fails to improve as anticipated. She expressed understanding with the diagnosis(es)  and plan.       [employer], was evaluated through a synchronous (real-time) audio-video encounter. The patient (or guardian if applicable) is aware that this is a billable service, which includes applicable co-pays. This Virtual Visit was conducted with patient's (and/or legal guardian's) consent. The visit was conducted pursuant to the emergency declaration under the  [employer] and the [employer], 1135 waiver authority and the [employer] and [employer] Act.  Patient identification was verified, and a caregiver was present when appropriate.  The patient was located at: Home: [address]  [place] [address]  The provider was located at: Facility (Appt Department): [address]  [place] [address]        Baron Sane, MD

## 2021-09-17 ENCOUNTER — Encounter

## 2021-09-18 MED ORDER — AMLODIPINE 10 MG TAB
10 mg | ORAL_TABLET | Freq: Every day | ORAL | 3 refills | Status: AC
Start: 2021-09-18 — End: ?

## 2021-09-22 ENCOUNTER — Encounter: Payer: Self-pay | Admitting: Internal Medicine

## 2021-10-17 ENCOUNTER — Encounter

## 2021-10-17 MED ORDER — DIAZEPAM 2 MG TAB
2 mg | ORAL_TABLET | ORAL | 0 refills | Status: AC
Start: 2021-10-17 — End: 2021-12-07

## 2021-11-03 ENCOUNTER — Telehealth

## 2021-11-03 NOTE — Telephone Encounter (Signed)
Pt is going to be coming in in April for labs for her wellness visit and she will need and order out there. Coming in 12/13/21

## 2021-11-03 NOTE — Telephone Encounter (Signed)
Labs ordered

## 2021-11-23 NOTE — Telephone Encounter (Signed)
 Dorothy with [employer] is calling for the last office notes and labs please    [phone] (fax)

## 2021-11-23 NOTE — Telephone Encounter (Signed)
 Records sent

## 2021-11-24 MED ORDER — PANTOPRAZOLE 40 MG TAB, DELAYED RELEASE
40 mg | ORAL_TABLET | ORAL | 3 refills | Status: AC
Start: 2021-11-24 — End: ?

## 2021-12-06 ENCOUNTER — Encounter

## 2021-12-07 ENCOUNTER — Telehealth: Attending: Family Medicine | Primary: Family Medicine

## 2021-12-07 ENCOUNTER — Telehealth: Admit: 2021-12-07 | Payer: MEDICARE | Attending: Family Medicine | Primary: Family Medicine

## 2021-12-07 DIAGNOSIS — F5104 Psychophysiologic insomnia: Secondary | ICD-10-CM

## 2021-12-07 NOTE — Progress Notes (Signed)
Chief Complaint   Patient presents with    Medication Refill         Health Maintenance Due   Topic Date Due    COVID-19 Vaccine (4 - Booster for Pfizer series) 08/31/2020    Medicare Yearly Exam  12/23/2021     1. "Have you been to the ER, urgent care clinic since your last visit?  Hospitalized since your last visit?" No    2. "Have you seen or consulted any other health care providers outside of the [facility] since your last visit?"  Yes, cardiologist Dr. Aurelio Brash       3. For patients aged 75-75: Has the patient had a colonoscopy / FIT/ Cologuard? NA - based on age      If the patient is female:    4. For patients aged 33-74: Has the patient had a mammogram within the past 2 years? NA - based on age or sex      97. For patients aged 21-65: Has the patient had a pap smear? NA - based on age or sex

## 2021-12-07 NOTE — Progress Notes (Signed)
Chief Complaint   Patient presents with    Medication Refill     Pt was evaluated via phone call only.     Pt reports that she has been doing well with Valium in the evening only.     No side effects.     Scheduled for physical in a few weeks.     Lindsay Berry is a 81 y.o. female, evaluated via audio-only technology on 12/07/2021 for Medication Refill  .    Assessment & Plan:   Diagnoses and all orders for this visit:    1. Chronic insomnia  -     diazePAM (VALIUM) 2 mg tablet; TAKE 1/2 TO 1 TABLET BY MOUTH EVERY 12 HOURS AS NEEDED FOR ANXIETY. MAX DAILY AMOUNT: 4 MG    2. Other chronic pain  -     diazePAM (VALIUM) 2 mg tablet; TAKE 1/2 TO 1 TABLET BY MOUTH EVERY 12 HOURS AS NEEDED FOR ANXIETY. MAX DAILY AMOUNT: 4 MG        12  Subjective:       Prior to Admission medications    Medication Sig Start Date End Date Taking? Authorizing Provider   diazePAM (VALIUM) 2 mg tablet TAKE 1/2 TO 1 TABLET BY MOUTH EVERY 12 HOURS AS NEEDED FOR ANXIETY. MAX DAILY AMOUNT: 4 MG 12/07/21  Yes Zeth Buday, Sheron Nightingale, MD   pantoprazole (PROTONIX) 40 mg tablet TAKE 1 TABLET BY MOUTH  TWICE DAILY 11/24/21  Yes Kaydan Wilhoite, Sheron Nightingale, MD   amLODIPine (NORVASC) 10 mg tablet TAKE 1 TABLET BY MOUTH  DAILY 09/18/21  Yes Margretta Zamorano, Sheron Nightingale, MD   valsartan (DIOVAN) 160 mg tablet Take 1 Tablet by mouth in the morning. 04/08/21  Yes Nickayla Mcinnis, Sheron Nightingale, MD   cholecalciferol (VITAMIN D3) (2,000 UNITS /50 MCG) cap capsule Take 1 Cap by mouth daily. 12/31/19  Yes Mahir Prabhakar, Sheron Nightingale, MD   aspirin delayed-release 81 mg tablet Take 1 Tablet by mouth daily.   Yes Provider, Historical   loratadine (CLARITIN) 10 mg tablet Take 1 Tablet by mouth daily as needed.   Yes Provider, Historical   red yeast rice extract 600 mg cap Take 4 Capsules by mouth daily.   Yes Provider, Historical   olopatadine (PATADAY) 0.2 % drop ophthalmic solution Administer 1 Drop to both eyes daily as needed.   Yes Provider, Historical   diazePAM (VALIUM) 2 mg tablet TAKE  1/2 TO 1 TABLET BY MOUTH EVERY 12 HOURS AS NEEDED FOR ANXIETY. MAX DAILY AMOUNT: 4 MG 10/17/21 12/07/21  Carsin Randazzo, Sheron Nightingale, MD   azelastine (ASTELIN) 137 mcg (0.1 %) nasal spray 1 Spray by Both Nostrils route two (2) times a day. Use in each nostril as directed 08/23/21   Arrick Dutton, Sheron Nightingale, MD   guaiFENesin ER (Mucinex) 600 mg ER tablet Take 1 Tablet by mouth two (2) times a day.  Patient not taking: Reported on 12/07/2021 08/23/21   Alexsis Kathman, Sheron Nightingale, MD     Allergies   Allergen Reactions    Ace Inhibitors Swelling     REACTION: swelling    Beta-Blockers (Beta-Adrenergic Blocking Agts) Palpitations     REACTION: swelling    Neomycin Swelling    Statins-Hmg-Coa Reductase Inhibitors Swelling     REACTION: myalgia    Tramadol Swelling     Does not want to take again.    Constipation, decreased appetite, hot/chills, did not do well with it       Review of Systems   Constitutional:  Negative  for chills, fever and malaise/fatigue.   HENT:  Negative for congestion and sore throat.    Respiratory:  Negative for cough and shortness of breath.    Cardiovascular:  Negative for chest pain and palpitations.   Gastrointestinal:  Negative for abdominal pain, heartburn, nausea and vomiting.   Genitourinary:  Negative for dysuria and urgency.   Musculoskeletal:  Negative for joint pain and myalgias.   Neurological:  Negative for dizziness, tingling and headaches.   All other systems reviewed and are negative.    No data recorded     [name] was evaluated through a patient-initiated, synchronous (real-time) audio only encounter. She (or guardian if applicable) is aware that it is a billable service, which includes applicable co-pays, with coverage as determined by her insurance carrier. This visit was conducted with the patient's (and/or legan guardian's) verbal consent. She has not had a related appointment within my department in the past 7 days or scheduled within the next 24 hours. The patient was located in a  state where the provider was licensed to provide care.  The patient was located at: Home: [address]  [place] [address]  The provider was located at: Facility (Appt Department): [address]  [address]  [place] [address]    Total Time: minutes: 11-20 minutes    Sheron Nightingale Dalton Molesworth, MD

## 2021-12-08 ENCOUNTER — Telehealth

## 2021-12-08 ENCOUNTER — Encounter: Payer: MEDICARE | Primary: Family Medicine

## 2021-12-08 ENCOUNTER — Ambulatory Visit: Admit: 2021-12-08 | Discharge: 2021-12-08 | Payer: MEDICARE | Primary: Family Medicine

## 2021-12-08 DIAGNOSIS — E559 Vitamin D deficiency, unspecified: Secondary | ICD-10-CM

## 2021-12-08 MED ORDER — DIAZEPAM 2 MG TAB
2 mg | ORAL_TABLET | ORAL | 2 refills | Status: DC
Start: 2021-12-08 — End: 2021-12-08

## 2021-12-08 MED ORDER — DIAZEPAM 2 MG TAB
2 mg | ORAL_TABLET | ORAL | 2 refills | Status: AC
Start: 2021-12-08 — End: ?

## 2021-12-08 NOTE — Telephone Encounter (Signed)
DOB verified. Informed pt of medication resent to requested pharmacy. Pt verified understanding and had no further questions.

## 2021-12-08 NOTE — Progress Notes (Signed)
No additional comment

## 2021-12-08 NOTE — Telephone Encounter (Signed)
-  Pt needs RX sent to [facility] (updated pharmacy)     -Please call pt when RX has been sent     diazePAM (VALIUM) 2 mg tablet    Thank You

## 2021-12-08 NOTE — Telephone Encounter (Signed)
Medication resent, please inform

## 2021-12-09 ENCOUNTER — Encounter: Payer: MEDICARE | Primary: Family Medicine

## 2021-12-09 LAB — LIPID PANEL
CHOL/HDL Ratio: 2.2 (ref 0.0–5.0)
Chol/HDL Ratio: 2.2 (ref 0.0–5.0)
Cholesterol, Total: 201 MG/DL — ABNORMAL HIGH (ref <200)
Cholesterol, total: 201 MG/DL — ABNORMAL HIGH (ref <200)
HDL Cholesterol: 93 MG/DL
HDL: 93 MG/DL
LDL Calculated: 85.2 MG/DL (ref 0–100)
LDL, calculated: 85.2 MG/DL (ref 0–100)
Triglyceride: 114 MG/DL (ref <150)
Triglycerides: 114 MG/DL (ref <150)
VLDL Cholesterol Calculated: 22.8 MG/DL
VLDL, calculated: 22.8 MG/DL

## 2021-12-09 LAB — CBC W/O DIFF
ABSOLUTE NRBC: 0 10*3/uL (ref 0.00–0.01)
HCT: 47.6 % — ABNORMAL HIGH (ref 35.0–47.0)
HGB: 15.7 g/dL (ref 11.5–16.0)
MCH: 30.1 PG (ref 26.0–34.0)
MCHC: 33 g/dL (ref 30.0–36.5)
MCV: 91.2 FL (ref 80.0–99.0)
MPV: 9.1 FL (ref 8.9–12.9)
NRBC: 0 PER 100 WBC
PLATELET: 380 10*3/uL (ref 150–400)
RBC: 5.22 M/uL — ABNORMAL HIGH (ref 3.80–5.20)
RDW: 12.7 % (ref 11.5–14.5)
WBC: 6.7 10*3/uL (ref 3.6–11.0)

## 2021-12-09 LAB — METABOLIC PANEL, COMPREHENSIVE
A-G Ratio: 1.6 (ref 1.1–2.2)
ALT (SGPT): 45 U/L (ref 12–78)
AST (SGOT): 22 U/L (ref 15–37)
Albumin: 5 g/dL (ref 3.5–5.0)
Alk. phosphatase: 110 U/L (ref 45–117)
Anion gap: 5 mmol/L (ref 5–15)
BUN/Creatinine ratio: 18 (ref 12–20)
BUN: 17 MG/DL (ref 6–20)
Bilirubin, total: 0.6 MG/DL (ref 0.2–1.0)
CO2: 29 mmol/L (ref 21–32)
Calcium: 10.4 MG/DL — ABNORMAL HIGH (ref 8.5–10.1)
Chloride: 97 mmol/L (ref 97–108)
Creatinine: 0.94 MG/DL (ref 0.55–1.02)
Globulin: 3.2 g/dL (ref 2.0–4.0)
Glucose: 108 mg/dL — ABNORMAL HIGH (ref 65–100)
Potassium: 4.4 mmol/L (ref 3.5–5.1)
Protein, total: 8.2 g/dL (ref 6.4–8.2)
Sodium: 131 mmol/L — ABNORMAL LOW (ref 136–145)
eGFR: >60 mL/min/{1.73_m2} (ref >60)

## 2021-12-09 LAB — TSH 3RD GENERATION
TSH: 1.16 u[IU]/mL (ref 0.36–3.74)
TSH: 1.16 u[IU]/mL (ref 0.36–3.74)

## 2021-12-09 LAB — HEMOGLOBIN A1C WITH EAG
Est. average glucose: 108 mg/dL
Hemoglobin A1c: 5.4 % (ref 4.0–5.6)

## 2021-12-09 LAB — VITAMIN D, 25 HYDROXY: Vitamin D 25-Hydroxy: 24.2 ng/mL — ABNORMAL LOW (ref 30–100)

## 2021-12-09 LAB — COMPREHENSIVE METABOLIC PANEL
ALT: 45 U/L (ref 12–78)
AST: 22 U/L (ref 15–37)
Albumin/Globulin Ratio: 1.6 (ref 1.1–2.2)
Albumin: 5 g/dL (ref 3.5–5.0)
Alkaline Phosphatase: 110 U/L (ref 45–117)
Anion Gap: 5 mmol/L (ref 5–15)
BUN: 17 MG/DL (ref 6–20)
Bun/Cre Ratio: 18 (ref 12–20)
CO2: 29 mmol/L (ref 21–32)
Calcium: 10.4 MG/DL — ABNORMAL HIGH (ref 8.5–10.1)
Chloride: 97 mmol/L (ref 97–108)
Creatinine: 0.94 MG/DL (ref 0.55–1.02)
ESTIMATED GLOMERULAR FILTRATION RATE: >60 mL/min/{1.73_m2} (ref >60)
Globulin: 3.2 g/dL (ref 2.0–4.0)
Glucose: 108 mg/dL — ABNORMAL HIGH (ref 65–100)
Potassium: 4.4 mmol/L (ref 3.5–5.1)
Sodium: 131 mmol/L — ABNORMAL LOW (ref 136–145)
Total Bilirubin: 0.6 MG/DL (ref 0.2–1.0)
Total Protein: 8.2 g/dL (ref 6.4–8.2)

## 2021-12-09 LAB — CBC
Hematocrit: 47.6 % — ABNORMAL HIGH (ref 35.0–47.0)
Hemoglobin: 15.7 g/dL (ref 11.5–16.0)
MCH: 30.1 PG (ref 26.0–34.0)
MCHC: 33 g/dL (ref 30.0–36.5)
MCV: 91.2 FL (ref 80.0–99.0)
MPV: 9.1 FL (ref 8.9–12.9)
NRBC Absolute: 0 10*3/uL (ref 0.00–0.01)
Nucleated RBCs: 0 PER 100 WBC
Platelets: 380 10*3/uL (ref 150–400)
RBC: 5.22 M/uL — ABNORMAL HIGH (ref 3.80–5.20)
RDW: 12.7 % (ref 11.5–14.5)
WBC: 6.7 10*3/uL (ref 3.6–11.0)

## 2021-12-09 LAB — HEMOGLOBIN A1C W/EAG
Hemoglobin A1C: 5.4 % (ref 4.0–5.6)
eAG: 108 mg/dL

## 2021-12-09 LAB — VITAMIN D 25 HYDROXY: Vit D, 25-Hydroxy: 24.2 ng/mL — ABNORMAL LOW (ref 30–100)

## 2021-12-13 ENCOUNTER — Encounter: Primary: Family Medicine

## 2021-12-19 ENCOUNTER — Encounter: Attending: Family Medicine | Primary: Family Medicine

## 2021-12-21 ENCOUNTER — Ambulatory Visit: Attending: Family Medicine | Primary: Family Medicine

## 2021-12-21 ENCOUNTER — Ambulatory Visit: Admit: 2021-12-21 | Discharge: 2021-12-21 | Payer: MEDICARE | Attending: Family Medicine | Primary: Family Medicine

## 2021-12-21 DIAGNOSIS — M545 Low back pain, unspecified: Secondary | ICD-10-CM

## 2021-12-21 DIAGNOSIS — I872 Venous insufficiency (chronic) (peripheral): Secondary | ICD-10-CM

## 2021-12-21 NOTE — Progress Notes (Signed)
Chief Complaint   Patient presents with    Annual Wellness Visit    Labs    Leg Swelling     Pt would like a referral to PT for back   Pt would also like handicap card renewed     Health Maintenance Due   Topic Date Due    COVID-19 Vaccine (4 - Booster for Pfizer series) 08/31/2020    Medicare Yearly Exam  12/23/2021       1. "Have you been to the ER, urgent care clinic since your last visit?  Hospitalized since your last visit?" No    2. "Have you seen or consulted any other health care providers outside of the [facility] since your last visit?"  Yes, cardiology Dr [facility] (stress test was done and echocardiogram)         3. For patients aged 72-75: Has the patient had a colonoscopy / FIT/ Cologuard? NA - based on age      If the patient is female:    4. For patients aged 57-74: Has the patient had a mammogram within the past 2 years? No      5. For patients aged 21-65: Has the patient had a pap smear? NA - based on age or sex

## 2021-12-21 NOTE — Addendum Note (Signed)
Addendum Note by Aloysius Heinle, Sheron Nightingale, MD at 12/21/21 0940                Author: Yosiah Jasmin, Sheron Nightingale, MD  Service: --  Author Type: Physician       Filed: 12/21/21 1103  Encounter Date: 12/21/2021  Status: Signed          Editor: Senia Even, Sheron Nightingale, MD (Physician)          Addended by: Baron Sane on: 12/21/2021 11:03 AM    Modules accepted: Orders, Level of Service

## 2021-12-21 NOTE — Progress Notes (Signed)
Chief Complaint   Patient presents with    Annual Wellness Visit    Labs    Leg Swelling     Pt would like a referral to PT for back   Pt would also like handicap card renewed     Pt reports that her podiatrist referred her to a cardiologist at [facility] due to swelling in her right foot, pt saw Dr. Ladona Horns. He ordered testing.   He resumed HCTZ.     Pt reports that she has seen no change in the swelling.     Swelling worsens as the day goes on.     Pt had labs done prior to visit, sodium had decreased slightly.     Pt reports that she could not tolerate compression stocking.   Subjective: (As above and below)     Chief Complaint   Patient presents with    Annual Wellness Visit    Labs    Leg Swelling     she is a 81 y.o. year old female who presents for evaluation.    Reviewed PmHx, RxHx, FmHx, SocHx, AllgHx and updated in chart.    Review of Systems - negative except as listed above    Objective:     Vitals:    12/21/21 0957   BP: (!) 152/78   Pulse: (!) 105   Resp: 16   SpO2: 93%   Weight: 166 lb 6.4 oz (75.5 kg)   Height: 5' 2.5" (1.588 m)     Physical Examination: General appearance - alert, well appearing, and in no distress  Mental status - normal mood, behavior, speech, dress, motor activity, and thought processes  Ears - bilateral TM's and external ear canals normal  Chest - clear to auscultation, no wheezes, rales or rhonchi, symmetric air entry  Heart - normal rate, regular rhythm, normal S1, S2, no murmurs, rubs, clicks or gallops  Musculoskeletal - no joint tenderness, deformity or swelling  Extremities - pedal edema 2 +, right leg only    Assessment/ Plan:   1. PAD (peripheral artery disease) (HCC)  -pt seen by vein clinic, records requested     2. Edema of right lower extremity due to peripheral venous insufficiency  -continue HCTZ    3. Hyponatremia  -recheck labs in one month    4. Essential hypertension  -home readings well controlled       I have discussed the diagnosis with the patient and the intended  plan as seen in the above orders.  The patient has received an after-visit summary and questions were answered concerning future plans.     Medication Side Effects and Warnings were discussed with patient: yes  Patient Labs were reviewed: yes  Patient Past Records were reviewed:  yes    Pat Patrick, M.D.            This is the Subsequent Medicare Annual Wellness Exam, performed 12 months or more after the Initial AWV or the last Subsequent AWV    I have reviewed the patient's medical history in detail and updated the computerized patient record.       Assessment/Plan   Education and counseling provided:  Are appropriate based on today's review and evaluation    1. Edema of right lower extremity due to peripheral venous insufficiency  2. PAD (peripheral artery disease) (HCC)  3. Hyponatremia  4. Essential hypertension  5. Medicare annual wellness visit, subsequent       Depression Risk Factor Screening  3 most recent PHQ Screens 12/21/2021   Little interest or pleasure in doing things Not at all   Feeling down, depressed, irritable, or hopeless Not at all   Total Score PHQ 2 0       Alcohol & Drug Abuse Risk Screen   Do you average more than 1 drink per night or more than 7 drinks a week?: (P) No  On any one occasion in the past three months have you had more than 3 drinks containing alcohol?: (P) No            Functional Ability and Level of Safety   Hearing:  Hearing: (P) Patient reports hearing is good      Activities of Daily Living:  The home contains: (P) grab bars  Functional ADLs: (P) Patient does total self care     Ambulation:  Patient ambulates: (P) with mild difficulty  Walking is difficult due to: (P) additional comments below  Walking aids: (P) walker     Fall Risk:  Fall Risk Assessment, last 12 mths 12/21/2021   Able to walk? Yes   Fall in past 12 months? 0   Do you feel unsteady? 0   Are you worried about falling 0   Is TUG test greater than 12 seconds? -   Is the gait abnormal? -   Number of  falls in past 12 months -   Fall with injury? -     Abuse Screen:  Do you ever feel afraid of your partner?: (P) No  Are you in a relationship with someone who physically or mentally threatens you?: (P) No  Is it safe for you to go home?: (P) Yes        Cognitive Screening   Has your family/caregiver stated any concerns about your memory?: (P) No     Cognitive Screening: Normal - MMSE (Mini Mental Status Exam)    Health Maintenance Due     Health Maintenance Due   Topic Date Due    COVID-19 Vaccine (4 - Booster for Pfizer series) 08/31/2020       Patient Care Team   Patient Care Team:  Arish Redner, Sheron Nightingale, MD as PCP - General (Family Medicine)  Sahmir Weatherbee, Sheron Nightingale, MD as PCP - [facility] Empaneled Provider    History     Patient Active Problem List   Diagnosis Code    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    Carotid stenosis I65.29    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    Chronic back pain greater than 3 months duration M54.9, G89.29    Chronic insomnia F51.04    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    Essential hypertension I10    Gait instability R26.81    Hypercholesterolemia E78.00    Hyponatremia E87.1    Injury of left knee S89.92XA    Lower extremity edema R60.0    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    Obstructive sleep apnea G47.33    Osteoarthritis, 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    PAD (peripheral artery disease) (HCC) I73.9    Pincer nail deformity L60.8    Raynaud phenomenon I73.00    S/P ankle fusion Z98.1    Situational anxiety F41.8    Vaginitis, atrophic N95.2    Vertigo R42    Other chronic pain G89.29     Past Medical History:   Diagnosis Date    Arthritis     Colon polyps     Diastolic dysfunction without  heart failure     Hypercholesterolemia     Hypertension     Hyponatremia     Insomnia     Meniere disease, unspecified laterality     PAD (peripheral artery disease) (HCC)     Situational anxiety     Stenosis of right carotid artery     Thyroid disease       Past Surgical  History:   Procedure Laterality Date    HX CHOLECYSTECTOMY  2004    HX ORTHOPAEDIC       Current Outpatient Medications   Medication Sig Dispense Refill    diazePAM (VALIUM) 2 mg tablet TAKE 1/2 TO 1 TABLET BY MOUTH EVERY 12 HOURS AS NEEDED FOR ANXIETY. MAX DAILY AMOUNT: 4 MG 45 Tablet 2    pantoprazole (PROTONIX) 40 mg tablet TAKE 1 TABLET BY MOUTH  TWICE DAILY 180 Tablet 3    amLODIPine (NORVASC) 10 mg tablet TAKE 1 TABLET BY MOUTH  DAILY 90 Tablet 3    valsartan (DIOVAN) 160 mg tablet Take 1 Tablet by mouth in the morning. 90 Tablet 3    cholecalciferol (VITAMIN D3) (2,000 UNITS /50 MCG) cap capsule Take 1 Cap by mouth daily. 90 Cap 3    aspirin delayed-release 81 mg tablet Take 1 Tablet by mouth daily.      red yeast rice extract 600 mg cap Take 4 Capsules by mouth daily.      olopatadine (PATADAY) 0.2 % drop ophthalmic solution Administer 1 Drop to both eyes daily as needed.       Allergies   Allergen Reactions    Ace Inhibitors Swelling     REACTION: swelling    Beta-Blockers (Beta-Adrenergic Blocking Agts) Palpitations     REACTION: swelling    Neomycin Swelling    Statins-Hmg-Coa Reductase Inhibitors Swelling     REACTION: myalgia    Tramadol Swelling     Does not want to take again.    Constipation, decreased appetite, hot/chills, did not do well with it       Family History   Problem Relation Age of Onset    Cancer Mother     Heart Disease Father      Social History     Tobacco Use    Smoking status: Never     Passive exposure: Never    Smokeless tobacco: Never   Substance Use Topics    Alcohol use: Never         Sheron NightingaleAndreya Brianne Vernadine Coombs, MD

## 2022-02-01 ENCOUNTER — Telehealth: Payer: Self-pay | Admitting: Internal Medicine

## 2022-02-01 ENCOUNTER — Encounter: Attending: Family Medicine | Primary: Family Medicine

## 2022-02-01 NOTE — Telephone Encounter (Signed)
Dr. Mortimer Fries please advise if virtual visit is okay for cpap compliance.

## 2022-02-03 NOTE — Telephone Encounter (Signed)
Dr. Kasa, please advise. Thanks °

## 2022-02-08 NOTE — Telephone Encounter (Signed)
Spoke to patient and scheduled mychart visit 05/09/2022 at 4:00. Nothing further needed.

## 2022-02-08 NOTE — Telephone Encounter (Signed)
Attempted to contact patient to schedule OV. Unable to hear patient clearly due to poor connection. She will call back.

## 2022-02-13 ENCOUNTER — Encounter: Payer: MEDICARE | Attending: Family Medicine | Primary: Family Medicine

## 2022-02-15 ENCOUNTER — Ambulatory Visit: Admit: 2022-02-15 | Discharge: 2022-02-15 | Payer: MEDICARE | Attending: Family Medicine | Primary: Family Medicine

## 2022-02-15 DIAGNOSIS — L600 Ingrowing nail: Secondary | ICD-10-CM

## 2022-02-15 MED ORDER — CEPHALEXIN 500 MG PO CAPS
500 MG | ORAL_CAPSULE | Freq: Three times a day (TID) | ORAL | 0 refills | Status: AC
Start: 2022-02-15 — End: 2022-02-22

## 2022-02-15 NOTE — Progress Notes (Signed)
Chief Complaint   Patient presents with    Medication Check    Toe Pain         Health Maintenance Due   Topic Date Due    DEXA (modify frequency per FRAX score)  Never done    Shingles vaccine (2 of 3) 03/23/2008    DTaP/Tdap/Td vaccine (1 - Tdap) 01/06/2017    COVID-19 Vaccine (4 - Booster for Pfizer series) 08/31/2020           1. "Have you been to the ER, urgent care clinic since your last visit?  Hospitalized since your last visit?" No    2. "Have you seen or consulted any other health care providers outside of the [facility] since your last visit?"  Yes, [facility] for toe fungus       3. For patients aged 42-75: Has the patient had a colonoscopy / FIT/ Cologuard? NA - based on age      If the patient is female:    4. For patients aged 28-74: Has the patient had a mammogram within the past 2 years? NA - based on age or sex      20. For patients aged 21-65: Has the patient had a pap smear? NA - based on age or sex

## 2022-02-15 NOTE — Progress Notes (Signed)
Chief Complaint   Patient presents with    Medication Check    Toe Pain     Pt reports that she had a pedicure about a month ago, developed pain in her great toe,     Pt was seen at urgent care, placed on keflex and a topical fungal solution.     Pt still has pain, has a podiatry appt next week.     Pt is still noticing more swelling in her ankles than usual, right more than left.   Pt used to be on HCTZ, did not help swelling. Pt does not feel like fluid pills will help. Does not want to try another.     Subjective: (As above and below)     Chief Complaint   Patient presents with    Medication Check    Toe Pain     she is a 81 y.o. year old female who presents for evaluation.    Reviewed PmHx, RxHx, FmHx, SocHx, AllgHx and updated in chart.    Review of Systems - negative except as listed above    Objective:     Vitals:    02/15/22 1034   BP: (!) 167/84   Site: Right Upper Arm   Position: Sitting   Cuff Size: Medium Adult   Pulse: 81   Resp: 16   SpO2: 98%   Weight: 164 lb 6.4 oz (74.6 kg)   Height: 5' 2.5" (1.588 m)     Physical Examination: General appearance - alert, well appearing, and in no distress  Ears - bilateral TM's and external ear canals normal  Chest - clear to auscultation, no wheezes, rales or rhonchi, symmetric air entry  Heart - normal rate, regular rhythm, normal S1, S2, no murmurs, rubs, clicks or gallops  Musculoskeletal - walks with a walker  Extremities - 1+ pitting edema in right ankle  -possible ingrown toenail medial edge right greater toenail    Assessment/ Plan:   1. Ingrown right greater toenail  -keep podiatry appt as scheduled  -take abx as written    2. Peripheral edema  -discussed at length  -pt declined fluid pills  -has tried compression stockings  -discussed with her cardiologist  -advised management with compression stocking, elevation, consider fluid pill if worsens  -pt is interested in  ortho eval, discussed that this is likely to change treatment, pt will consider        Return in about 3 months (around 05/18/2022), or if symptoms worsen or fail to improve.    I have discussed the diagnosis with the patient and the intended plan as seen in the above orders.  The patient has received an after-visit summary and questions were answered concerning future plans.     Medication Side Effects and Warnings were discussed with patient: yes  Patient Labs were reviewed: yes  Patient Past Records were reviewed:  yes    Pat Patrick, M.D.

## 2022-02-27 MED ORDER — VALSARTAN 160 MG PO TABS
160 MG | ORAL_TABLET | ORAL | 3 refills | Status: AC
Start: 2022-02-27 — End: 2023-01-18

## 2022-03-28 ENCOUNTER — Telehealth

## 2022-03-28 MED ORDER — DIAZEPAM 2 MG PO TABS
2 MG | ORAL_TABLET | ORAL | 0 refills | Status: DC
Start: 2022-03-28 — End: 2022-04-26

## 2022-03-28 NOTE — Telephone Encounter (Signed)
DOB verified.     Pt notified.

## 2022-03-28 NOTE — Telephone Encounter (Signed)
Medication refilled today, please inform

## 2022-03-28 NOTE — Telephone Encounter (Signed)
Refill Request: Pt calling   -Pt is almost out of medication   -Pt states she was just here in June  -Please call pt back when RX has been sent to pharmacy   -Pt's RX is:   diazePAM (VALIUM) 2 MG tablet   -Pt's Phamarmacy: Is correct in Pt's chart      Thank You

## 2022-04-26 ENCOUNTER — Encounter

## 2022-04-26 MED ORDER — DIAZEPAM 2 MG PO TABS
2 MG | ORAL_TABLET | ORAL | 0 refills | Status: AC
Start: 2022-04-26 — End: 2022-05-26

## 2022-05-09 ENCOUNTER — Encounter: Payer: Self-pay | Admitting: Internal Medicine

## 2022-05-09 ENCOUNTER — Telehealth (INDEPENDENT_AMBULATORY_CARE_PROVIDER_SITE_OTHER): Payer: Medicare Other | Admitting: Internal Medicine

## 2022-05-09 DIAGNOSIS — G4733 Obstructive sleep apnea (adult) (pediatric): Secondary | ICD-10-CM | POA: Diagnosis not present

## 2022-05-09 NOTE — Patient Instructions (Signed)
DL CPAP RESULTS IN DETAILS CONTINUE CPAP AS PRESCRIBED EXCELLENT JOB A+!!!!

## 2022-05-09 NOTE — Progress Notes (Signed)
* [facility]    I connected with the patient by telephone enabled telemedicine visit and verified that I am speaking with the correct person using two identifiers.    I discussed the limitations, risks, security and privacy concerns of performing an evaluation and management service by telemedicine and the availability of in-person appointments. I also discussed with the patient that there may be a patient responsible charge related to this service. The patient expressed understanding and agreed to proceed.  PATIENT AGREES AND CONFIRMS -YES   Other persons participating in the visit and their role in the encounter: Patient, nursing  This visit type was conducted due to national recommendations for restrictions regarding the COVID-19 Pandemic (e.g. social distancing).  This format is felt to be most appropriate for this patient at this time.  All issues noted in this document were discussed and addressed.       Patient'@home'$ , provider'@office'$ .   Date: 05/09/2022  MRN# [id number] [name] [name] 12-24-1983   CC Follow up OSA   HPI:  Overall doing well with CPAP Very good compliance Reports reviewed with patient in detail  No exacerbation at this time No evidence of heart failure at this time No evidence or signs of infection at this time No respiratory distress No fevers, chills, nausea, vomiting, diarrhea No evidence of lower extremity edema No evidence hemoptysis  04/2022 EXCELLENT COMPLIANCE REPORT 100% days and usage hrs AHI 2.3 Auto CPAP 5-15    She is taking astelin one spray in each nostril every night, she also takes claritin every day.   **CPAP 12/2020 Excellent compliance AHI reduced 0.6   **CPAP download 01/2020 100% compliance days and >4 hrs AHI down to 0.4   **CPAP download 01/26/25/23/20>> raw data personally reviewed.  Usage greater than 4 hours is 30/30 days.  Average usage on days used 9 hours 6 minutes.  Pressure range 5-15.   Median pressure 9, 95 percentile pressure 13, max pressure 14.3.  Residual AHI 0.6.  Overall this shows excellent compliance with CPAP with excellent control of obstructive sleep apnea. **Download 30 days as or 02/18/18; data personally reviewed. Usage great than 4 hours is 28/30 days. Avg usage on days used is 8 hours and 29 minutes. Median pressure is 9, 95th percentile is 12, max pressure is 14.   Review of download data for 30 days shows average usage of 8 hours 35 minutes, CPAP, his AutoSet 5-15. 95th percentile pressure was 13.6. Review graphic data shows a pressure range between 12 and 14. Residual AHI is 0.5     Medication:   Outpatient Encounter Medications as of 05/09/2022  Medication Sig   aspirin 81 MG tablet Take 81 mg by mouth daily.     azelastine (ASTELIN) 0.1 % nasal spray U 2 SPRAYS IEN Q 12 H.   celecoxib (CELEBREX) 200 MG capsule TAKE 1 CAPSULE(200 MG) BY MOUTH TWICE DAILY   DIOVAN 40 MG tablet TAKE 3 TABLETS BY MOUTH EVERY DAY   furosemide (LASIX) 20 MG tablet Take 0.5 tablets (10 mg total) by mouth daily. For 3 days and as needed   hydrochlorothiazide (HYDRODIURIL) 25 MG tablet TAKE 1 TABLET(25 MG) BY MOUTH DAILY AS NEEDED   loratadine (CLARITIN) 10 MG tablet Take 10 mg by mouth daily.   NORVASC 10 MG tablet Take 1 tablet (10 mg total) by mouth daily.   pantoprazole (PROTONIX) 40 MG tablet TAKE 1 TABLET BY MOUTH TWICE DAILY   predniSONE (STERAPRED UNI-PAK 21 TAB) 10 MG (21)  TBPK tablet TK PO UTD   Red Yeast Rice 600 MG CAPS Take 4 capsules by mouth daily.    VALIUM 2 MG tablet TAKE 1/2 TO 1 TABLET BY MOUTH EVERY 12 HOURS   No facility-administered encounter medications on file as of 05/09/2022.     Allergies:  Ace inhibitors, Neosporin [neomycin-bacitracin zn-polymyx], Statins, Tramadol, and Beta adrenergic blockers   Review of Systems:  Gen:  Denies  fever, sweats, chills weight loss  HEENT: Denies blurred vision, double vision, ear pain, eye pain, hearing loss,  nose bleeds, sore throat Cardiac:  No dizziness, chest pain or heaviness, chest tightness,edema, No JVD Resp:   No cough, -sputum production, -shortness of breath,-wheezing, -hemoptysis,  Other:  All other systems negative     A/P  81 year old pleasant white female follow-up visit for assessment for sleep apnea Overall patient is doing extremely well with compliance patient uses and benefits from therapy   [facility]A April compliance report shows excellent compliance at 100% AHI reduced to 2.3 Auto CPAP 5-15   Follow up in 1 year   MEDICATION ADJUSTMENTS/LABS AND TESTS ORDERED: Continue AUTOCPAP as prescibed   Patient  satisfied with Plan of action and management. All questions answered  Follow up 1 year  Total Time spent 10  mins  Maretta Bees Patricia Pesa, M.D.  [facility]  Medical Director [place] Director [facility] Cardio-Pulmonary Department

## 2022-06-09 ENCOUNTER — Ambulatory Visit: Admit: 2022-06-09 | Discharge: 2022-06-09 | Payer: MEDICARE | Primary: Family Medicine

## 2022-06-09 ENCOUNTER — Ambulatory Visit: Payer: MEDICARE | Primary: Family Medicine

## 2022-06-09 ENCOUNTER — Encounter

## 2022-06-09 DIAGNOSIS — E559 Vitamin D deficiency, unspecified: Secondary | ICD-10-CM

## 2022-06-09 NOTE — Progress Notes (Signed)
Labs ordered

## 2022-06-10 LAB — VITAMIN D 25 HYDROXY: Vit D, 25-Hydroxy: 26.3 ng/mL — ABNORMAL LOW (ref 30–100)

## 2022-06-10 LAB — HEMOGLOBIN A1C
Hemoglobin A1C: 5.5 % (ref 4.0–5.6)
eAG: 111 mg/dL

## 2022-06-10 LAB — CBC WITH AUTO DIFFERENTIAL
Absolute Immature Granulocyte: 0 10*3/uL (ref 0.00–0.04)
Basophils %: 2 % — ABNORMAL HIGH (ref 0–1)
Basophils Absolute: 0.1 10*3/uL (ref 0.0–0.1)
Eosinophils %: 2 % (ref 0–7)
Eosinophils Absolute: 0.1 10*3/uL (ref 0.0–0.4)
Hematocrit: 43.9 % (ref 35.0–47.0)
Hemoglobin: 14.3 g/dL (ref 11.5–16.0)
Immature Granulocytes: 0 % (ref 0.0–0.5)
Lymphocytes %: 45 % (ref 12–49)
Lymphocytes Absolute: 2.5 10*3/uL (ref 0.8–3.5)
MCH: 29.7 PG (ref 26.0–34.0)
MCHC: 32.6 g/dL (ref 30.0–36.5)
MCV: 91.3 FL (ref 80.0–99.0)
MPV: 9.5 FL (ref 8.9–12.9)
Monocytes %: 6 % (ref 5–13)
Monocytes Absolute: 0.3 10*3/uL (ref 0.0–1.0)
Neutrophils %: 45 % (ref 32–75)
Neutrophils Absolute: 2.4 10*3/uL (ref 1.8–8.0)
Nucleated RBCs: 0 PER 100 WBC
Platelets: 283 10*3/uL (ref 150–400)
RBC: 4.81 M/uL (ref 3.80–5.20)
RDW: 13.4 % (ref 11.5–14.5)
WBC: 5.5 10*3/uL (ref 3.6–11.0)
nRBC: 0 10*3/uL (ref 0.00–0.01)

## 2022-06-10 LAB — COMPREHENSIVE METABOLIC PANEL
ALT: 34 U/L (ref 12–78)
AST: 17 U/L (ref 15–37)
Albumin/Globulin Ratio: 1.4 (ref 1.1–2.2)
Albumin: 4.4 g/dL (ref 3.5–5.0)
Alk Phosphatase: 107 U/L (ref 45–117)
Anion Gap: 4 mmol/L — ABNORMAL LOW (ref 5–15)
BUN: 18 MG/DL (ref 6–20)
Bun/Cre Ratio: 21 — ABNORMAL HIGH (ref 12–20)
CO2: 29 mmol/L (ref 21–32)
Calcium: 9.6 MG/DL (ref 8.5–10.1)
Chloride: 105 mmol/L (ref 97–108)
Creatinine: 0.84 MG/DL (ref 0.55–1.02)
Est, Glom Filt Rate: >60 mL/min/{1.73_m2} (ref >60)
Globulin: 3.1 g/dL (ref 2.0–4.0)
Glucose: 96 mg/dL (ref 65–100)
Potassium: 4.5 mmol/L (ref 3.5–5.1)
Sodium: 138 mmol/L (ref 136–145)
Total Bilirubin: 0.6 MG/DL (ref 0.2–1.0)
Total Protein: 7.5 g/dL (ref 6.4–8.2)

## 2022-06-10 LAB — LIPID PANEL
Chol/HDL Ratio: 2.5 (ref 0.0–5.0)
Cholesterol, Total: 203 MG/DL — ABNORMAL HIGH (ref <200)
HDL: 82 MG/DL
LDL Calculated: 95.4 MG/DL (ref 0–100)
Triglycerides: 128 MG/DL (ref <150)
VLDL Cholesterol Calculated: 25.6 MG/DL

## 2022-06-10 LAB — TSH: TSH, 3RD GENERATION: 1.03 u[IU]/mL (ref 0.36–3.74)

## 2022-06-19 ENCOUNTER — Encounter

## 2022-06-19 MED ORDER — DIAZEPAM 2 MG PO TABS
2 MG | ORAL_TABLET | ORAL | 0 refills | Status: DC
Start: 2022-06-19 — End: 2022-08-01

## 2022-06-21 ENCOUNTER — Ambulatory Visit: Admit: 2022-06-21 | Discharge: 2022-06-21 | Payer: MEDICARE | Attending: Family Medicine | Primary: Family Medicine

## 2022-06-21 DIAGNOSIS — Z23 Encounter for immunization: Secondary | ICD-10-CM

## 2022-06-21 NOTE — Progress Notes (Signed)
Chief Complaint   Patient presents with    Follow-up     6 month f/u     Pt is off of celebrex and HCTZ.     Pt reports that swelling has improved, joint pain has worsened but is manageable.     Pt would also like to get off of diazepam, was put on due to muscle pain.   Pt is concerned that this medication is creating anxiety.     Subjective: (As above and below)     Chief Complaint   Patient presents with    Follow-up     6 month f/u     she is a 81 y.o. year old female who presents for evaluation.    Reviewed PmHx, RxHx, FmHx, SocHx, AllgHx and updated in chart.    Review of Systems - negative except as listed above    Objective:     Vitals:    06/21/22 1011 06/21/22 1029   BP: (!) 155/79 138/79   Site: Left Upper Arm    Position: Sitting    Cuff Size: Medium Adult    Pulse: 77    Temp: 97.6 F (36.4 C)    TempSrc: Temporal    SpO2: 98%    Weight: 74.8 kg (165 lb)      Physical Examination: General appearance - alert, well appearing, and in no distress  Mental status - normal mood, behavior, speech, dress, motor activity, and thought processes  Ears - bilateral TM's and external ear canals normal  Chest - clear to auscultation, no wheezes, rales or rhonchi, symmetric air entry  Heart - normal rate, regular rhythm, normal S1, S2, no murmurs, rubs, clicks or gallops  Musculoskeletal - walks with a walker  Extremities - peripheral pulses normal, no pedal edema, no clubbing or cyanosis    Assessment/ Plan:   1. Needs flu shot  - Influenza, FLUAD, (age 20 y+), IM, Preservative Free, 0.5 mL    2. Essential hypertension  -slightly elevated, continue on current medication   - CBC with Auto Differential; Future  - Lipid Panel; Future  - TSH; Future    3. Hyponatremia  -check labs  - Comprehensive Metabolic Panel; Future    4. Vitamin D deficiency, unspecified  - Vitamin D 25 Hydroxy; Future    5. Other specified anxiety disorders  -discussed how to reduce valium, will try to reduce to half a tablet daily, monitor for side  effects       No follow-ups on file.    I have discussed the diagnosis with the patient and the intended plan as seen in the above orders.  The patient has received an after-visit summary and questions were answered concerning future plans.     Medication Side Effects and Warnings were discussed with patient: yes  Patient Labs were reviewed: yes  Patient Past Records were reviewed:  yes    [name], M.D.

## 2022-06-21 NOTE — Progress Notes (Signed)
Chief Complaint   Patient presents with    Follow-up     6 month f/u         Health Maintenance Due   Topic Date Due    DEXA (modify frequency per FRAX score)  Never done    Shingles vaccine (2 of 3) 03/23/2008    Pneumococcal 65+ years Vaccine (2 - PCV) 08/13/2009    DTaP/Tdap/Td vaccine (1 - Tdap) 01/06/2017    COVID-19 Vaccine (4 - Pfizer series) 08/31/2020    Flu vaccine (1) 04/04/2022         "Have you been to the ER, urgent care clinic since your last visit?  Hospitalized since your last visit?"    NO    "Have you seen or consulted any other health care providers outside of [facility] since your last visit?"    NO

## 2022-06-22 LAB — CBC WITH AUTO DIFFERENTIAL
Absolute Immature Granulocyte: 0 10*3/uL (ref 0.00–0.04)
Basophils %: 1 % (ref 0–1)
Basophils Absolute: 0.1 10*3/uL (ref 0.0–0.1)
Eosinophils %: 1 % (ref 0–7)
Eosinophils Absolute: 0.1 10*3/uL (ref 0.0–0.4)
Hematocrit: 46 % (ref 35.0–47.0)
Hemoglobin: 15.4 g/dL (ref 11.5–16.0)
Immature Granulocytes: 0 % (ref 0.0–0.5)
Lymphocytes %: 45 % (ref 12–49)
Lymphocytes Absolute: 3.1 10*3/uL (ref 0.8–3.5)
MCH: 29.7 PG (ref 26.0–34.0)
MCHC: 33.5 g/dL (ref 30.0–36.5)
MCV: 88.8 FL (ref 80.0–99.0)
MPV: 9.2 FL (ref 8.9–12.9)
Monocytes %: 7 % (ref 5–13)
Monocytes Absolute: 0.5 10*3/uL (ref 0.0–1.0)
Neutrophils %: 46 % (ref 32–75)
Neutrophils Absolute: 3.2 10*3/uL (ref 1.8–8.0)
Nucleated RBCs: 0 PER 100 WBC
Platelets: 312 10*3/uL (ref 150–400)
RBC: 5.18 M/uL (ref 3.80–5.20)
RDW: 13.2 % (ref 11.5–14.5)
WBC: 6.8 10*3/uL (ref 3.6–11.0)
nRBC: 0 10*3/uL (ref 0.00–0.01)

## 2022-06-22 LAB — LIPID PANEL
Chol/HDL Ratio: 2.5 (ref 0.0–5.0)
Cholesterol, Total: 226 MG/DL — ABNORMAL HIGH (ref <200)
HDL: 91 MG/DL
LDL Calculated: 98.2 MG/DL (ref 0–100)
Triglycerides: 184 MG/DL — ABNORMAL HIGH (ref <150)
VLDL Cholesterol Calculated: 36.8 MG/DL

## 2022-06-22 LAB — COMPREHENSIVE METABOLIC PANEL
ALT: 35 U/L (ref 12–78)
AST: 15 U/L (ref 15–37)
Albumin/Globulin Ratio: 1.4 (ref 1.1–2.2)
Albumin: 4.9 g/dL (ref 3.5–5.0)
Alk Phosphatase: 121 U/L — ABNORMAL HIGH (ref 45–117)
Anion Gap: 7 mmol/L (ref 5–15)
BUN: 20 MG/DL (ref 6–20)
Bun/Cre Ratio: 24 — ABNORMAL HIGH (ref 12–20)
CO2: 25 mmol/L (ref 21–32)
Calcium: 10.3 MG/DL — ABNORMAL HIGH (ref 8.5–10.1)
Chloride: 103 mmol/L (ref 97–108)
Creatinine: 0.83 MG/DL (ref 0.55–1.02)
Est, Glom Filt Rate: >60 mL/min/{1.73_m2} (ref >60)
Globulin: 3.6 g/dL (ref 2.0–4.0)
Glucose: 83 mg/dL (ref 65–100)
Potassium: 4.4 mmol/L (ref 3.5–5.1)
Sodium: 135 mmol/L — ABNORMAL LOW (ref 136–145)
Total Bilirubin: 0.6 MG/DL (ref 0.2–1.0)
Total Protein: 8.5 g/dL — ABNORMAL HIGH (ref 6.4–8.2)

## 2022-06-22 LAB — VITAMIN D 25 HYDROXY: Vit D, 25-Hydroxy: 21.5 ng/mL — ABNORMAL LOW (ref 30–100)

## 2022-06-22 LAB — TSH: TSH, 3RD GENERATION: 1.32 u[IU]/mL (ref 0.36–3.74)

## 2022-07-31 MED ORDER — AMLODIPINE BESYLATE 10 MG PO TABS
10 MG | ORAL_TABLET | Freq: Every day | ORAL | 3 refills | Status: AC
Start: 2022-07-31 — End: 2023-01-03

## 2022-08-01 ENCOUNTER — Encounter

## 2022-08-01 MED ORDER — DIAZEPAM 2 MG PO TABS
2 MG | ORAL_TABLET | ORAL | 0 refills | Status: AC
Start: 2022-08-01 — End: 2022-08-31

## 2022-08-03 NOTE — Telephone Encounter (Signed)
Pt called the office and said that [facility] doesn't have the RX for diazepam.

## 2022-08-03 NOTE — Telephone Encounter (Signed)
Refill Request: pt calling   -Pt checking the status of the Rx   -Pt states that she reached out to the pharmacy and stated that they have not heard anything; please reach back out to the pt or the pt's pharmacy   -Pt is requesting a call back     Please call the pt back     Thank You

## 2022-08-04 MED ORDER — DIAZEPAM 2 MG PO TABS
2 MG | ORAL_TABLET | ORAL | 0 refills | Status: AC
Start: 2022-08-04 — End: 2022-09-03

## 2022-08-04 NOTE — Addendum Note (Signed)
Addended by: [name] on: 08/04/2022 09:33 AM     Modules accepted: Orders

## 2022-08-04 NOTE — Telephone Encounter (Signed)
Transmission to pharmacy had failed, resent

## 2022-10-06 ENCOUNTER — Encounter

## 2022-10-09 ENCOUNTER — Encounter

## 2022-10-09 MED ORDER — DIAZEPAM 2 MG PO TABS
2 MG | ORAL_TABLET | ORAL | 0 refills | Status: DC
Start: 2022-10-09 — End: 2022-10-16

## 2022-10-09 NOTE — Telephone Encounter (Signed)
Refill request (pt calling)    Pt is out    diazePAM (VALIUM) 2 MG tablet        Pt has apt on 02/12

## 2022-10-16 ENCOUNTER — Telehealth: Admit: 2022-10-16 | Discharge: 2022-10-16 | Payer: MEDICARE | Attending: Family Medicine | Primary: Family Medicine

## 2022-10-16 DIAGNOSIS — F418 Other specified anxiety disorders: Secondary | ICD-10-CM

## 2022-10-16 MED ORDER — DIAZEPAM 2 MG PO TABS
2 | ORAL_TABLET | ORAL | 2 refills | Status: AC
Start: 2022-10-16 — End: 2022-11-15

## 2022-10-16 NOTE — Progress Notes (Signed)
Chief Complaint   Patient presents with    Medication Check         Health Maintenance Due   Topic Date Due    Respiratory Syncytial Virus (RSV) Pregnant or age 84 yrs+ (21 - 1-dose 60+ series) Never done    DTaP/Tdap/Td vaccine (1 - Tdap) 01/06/2017    COVID-19 Vaccine (4 - 2023-24 season) 05/05/2022         "Have you been to the ER, urgent care clinic since your last visit?  Hospitalized since your last visit?"    NO    "Have you seen or consulted any other health care providers outside of [place] since your last visit?"    Cardiology, Dermatology, Podiatry

## 2022-10-19 NOTE — Progress Notes (Signed)
Chief Complaint   Patient presents with    Medication Check     Pt was seen via virtual video visit.     Pt reports that she has been doing well, still taking valium nightly to help with sleep.     No side effects, no recent illness. No other changes in medications.     10/16/2022    TELEHEALTH EVALUATION -- Audio/Visual    HPI:    Lindsay Berry (DOB:  1941-06-06) has requested an audio/video evaluation for the following concern(s):    As above    Review of Systems   All other systems reviewed and are negative.      Prior to Visit Medications    Medication Sig Taking? Authorizing Provider   diazePAM (VALIUM) 2 MG tablet TAKE 1/2 TO 1 TABLET BY MOUTH EVERY 12 HOURS AS NEEDED FOR ANXIETY. MAX DAILY AMOUNT: 4 MG Yes Kazi Reppond B, MD   amLODIPine (NORVASC) 10 MG tablet TAKE 1 TABLET BY MOUTH DAILY Yes Rion Schnitzer B, MD   valsartan (DIOVAN) 160 MG tablet TAKE 1 TABLET BY MOUTH IN  THE MORNING Yes Cincere Zorn, Helene Shoe, MD   aspirin 81 MG EC tablet Take 1 tablet by mouth daily Yes Automatic Reconciliation, Ar   Cholecalciferol 50 MCG (2000 UT) CAPS Take 1 capsule by mouth daily Yes Automatic Reconciliation, Ar   olopatadine (PATADAY) 0.2 % SOLN ophthalmic solution Apply 1 drop to eye daily Yes Automatic Reconciliation, Ar   pantoprazole (PROTONIX) 40 MG tablet Take 1 tablet by mouth 2 times daily Yes Automatic Reconciliation, Ar   Red Yeast Rice Extract 600 MG CAPS Take 4 capsules by mouth daily Yes Automatic Reconciliation, Ar       Social History     Tobacco Use    Smoking status: Never    Smokeless tobacco: Never   Substance Use Topics    Alcohol use: Never    Drug use: Never        Past Medical History:   Diagnosis Date    Arthritis     Colon polyps     Diastolic dysfunction without heart failure     Hypercholesterolemia     Hypertension     Hyponatremia     Insomnia     Meniere disease, unspecified laterality     PAD (peripheral artery disease) (HCC)     Situational anxiety     Stenosis of right carotid artery      Thyroid disease        PHYSICAL EXAMINATION:  [ INSTRUCTIONS:  "[x]$ " Indicates a positive item  "[]$ " Indicates a negative item  -- DELETE ALL ITEMS NOT EXAMINED]  Vital Signs: (As obtained by patient/caregiver or practitioner observation)    Blood pressure-  Heart rate-    Respiratory rate-    Temperature-  Pulse oximetry-     Constitutional: [x]$  Appears well-developed and well-nourished []$  No apparent distress      []$  Abnormal-   Mental status  [x]$  Alert and awake  []$  Oriented to person/place/time []$ Able to follow commands      Eyes:  EOM    [x]$   Normal  []$  Abnormal-  Sclera  []$   Normal  []$  Abnormal -         Discharge []$   None visible  []$  Abnormal -    HENT:   [x]$  Normocephalic, atraumatic.  []$  Abnormal   []$  Mouth/Throat: Mucous membranes are moist.     External Ears [x]$  Normal  []$  Abnormal-  Neck: [x]$  No visualized mass     Pulmonary/Chest: [x]$  Respiratory effort normal.  []$  No visualized signs of difficulty breathing or respiratory distress        []$  Abnormal-      Musculoskeletal:   []$  Normal gait with no signs of ataxia         [x]$  Normal range of motion of neck        []$  Abnormal-       Neurological:        [x]$  No Facial Asymmetry (Cranial nerve 7 motor function) (limited exam to video visit)          []$  No gaze palsy        []$  Abnormal-         Skin:        [x]$  No significant exanthematous lesions or discoloration noted on facial skin         []$  Abnormal-            Psychiatric:       [x]$  Normal Affect []$  No Hallucinations        []$  Abnormal-     Other pertinent observable physical exam findings-     ASSESSMENT/PLAN:  1. Other specified anxiety disorders  -doing well on current medication, advised to try to limit use and take as needed  - diazePAM (VALIUM) 2 MG tablet; TAKE 1/2 TO 1 TABLET BY MOUTH EVERY 12 HOURS AS NEEDED FOR ANXIETY. MAX DAILY AMOUNT: 4 MG  Dispense: 45 tablet; Refill: 2    2. Peripheral vascular disease, unspecified ([place])  -unchanged, continue on current medications      Return in  about 3 months (around 01/14/2023).    [employer], was evaluated through a synchronous (real-time) audio-video encounter. The patient (or guardian if applicable) is aware that this is a billable service, which includes applicable co-pays. This Virtual Visit was conducted with patient's (and/or legal guardian's) consent. Patient identification was verified, and a caregiver was present when appropriate.   The patient was located at Home: [address]  [place] [address]  Provider was located at [employer] (Appt Dept): [address] [place] [address]      --[name], MD on 10/19/2022 at 2:10 PM    An electronic signature was used to authenticate this note.

## 2022-10-30 MED ORDER — PANTOPRAZOLE SODIUM 40 MG PO TBEC
40 MG | ORAL_TABLET | Freq: Two times a day (BID) | ORAL | 3 refills | Status: AC
Start: 2022-10-30 — End: 2023-09-13

## 2022-12-13 ENCOUNTER — Ambulatory Visit: Payer: MEDICARE | Attending: Family Medicine | Primary: Family Medicine

## 2022-12-19 ENCOUNTER — Telehealth

## 2022-12-19 NOTE — Addendum Note (Signed)
Addended by: Pat Patrick on: 12/19/2022 07:32 PM     Modules accepted: Orders

## 2022-12-19 NOTE — Telephone Encounter (Signed)
-----   Message from [facility] sent at 12/19/2022 10:26 AM EDT -----  Subject: Referral Request    Reason for referral request? Routine blood work to be reviewed at 05/01   appt.  Provider patient wants to be referred to(if known): Sherrlyn Hock Risser    Provider Phone Number(if known):    Additional Information for Provider? Call patient when ready to set up lab   appt. Wishes to review labs on 05/01 with Dr. Matilde Haymaker.  ---------------------------------------------------------------------------  --------------  Lindsay Berry INFO    [phone]; OK to leave message on voicemail  ---------------------------------------------------------------------------  --------------

## 2022-12-19 NOTE — Telephone Encounter (Signed)
Labs ordered.

## 2022-12-20 NOTE — Telephone Encounter (Signed)
Pt notified and scheduled lab appointment for 4/23

## 2022-12-26 ENCOUNTER — Ambulatory Visit: Admit: 2022-12-26 | Discharge: 2022-12-26 | Payer: MEDICARE | Primary: Family Medicine

## 2022-12-26 DIAGNOSIS — E559 Vitamin D deficiency, unspecified: Secondary | ICD-10-CM

## 2022-12-27 LAB — LIPID PANEL
Chol/HDL Ratio: 2.9 (ref 0.0–5.0)
Cholesterol, Total: 240 MG/DL — ABNORMAL HIGH (ref <200)
HDL: 82 MG/DL
LDL Calculated: 119.8 MG/DL — ABNORMAL HIGH (ref 0–100)
Triglycerides: 191 MG/DL — ABNORMAL HIGH (ref <150)
VLDL Cholesterol Calculated: 38.2 MG/DL

## 2022-12-27 LAB — CBC WITH AUTO DIFFERENTIAL
Basophils %: 1 % (ref 0–1)
Basophils Absolute: 0.1 10*3/uL (ref 0.0–0.1)
Eosinophils %: 3 % (ref 0–7)
Eosinophils Absolute: 0.2 10*3/uL (ref 0.0–0.4)
Hematocrit: 48.6 % — ABNORMAL HIGH (ref 35.0–47.0)
Hemoglobin: 15.8 g/dL (ref 11.5–16.0)
Immature Granulocytes %: 0 % (ref 0.0–0.5)
Immature Granulocytes Absolute: 0 10*3/uL (ref 0.00–0.04)
Lymphocytes %: 43 % (ref 12–49)
Lymphocytes Absolute: 2.5 10*3/uL (ref 0.8–3.5)
MCH: 30 PG (ref 26.0–34.0)
MCHC: 32.5 g/dL (ref 30.0–36.5)
MCV: 92.4 FL (ref 80.0–99.0)
MPV: 9.4 FL (ref 8.9–12.9)
Monocytes %: 5 % (ref 5–13)
Monocytes Absolute: 0.3 10*3/uL (ref 0.0–1.0)
Neutrophils %: 48 % (ref 32–75)
Neutrophils Absolute: 2.7 10*3/uL (ref 1.8–8.0)
Nucleated RBCs: 0 PER 100 WBC
Platelets: 340 10*3/uL (ref 150–400)
RBC: 5.26 M/uL — ABNORMAL HIGH (ref 3.80–5.20)
RDW: 13.3 % (ref 11.5–14.5)
WBC: 5.7 10*3/uL (ref 3.6–11.0)
nRBC: 0 10*3/uL (ref 0.00–0.01)

## 2022-12-27 LAB — COMPREHENSIVE METABOLIC PANEL
ALT: 37 U/L (ref 12–78)
AST: 20 U/L (ref 15–37)
Albumin/Globulin Ratio: 1.4 (ref 1.1–2.2)
Albumin: 4.8 g/dL (ref 3.5–5.0)
Alk Phosphatase: 124 U/L — ABNORMAL HIGH (ref 45–117)
Anion Gap: 10 mmol/L (ref 5–15)
BUN: 19 MG/DL (ref 6–20)
Bun/Cre Ratio: 22 — ABNORMAL HIGH (ref 12–20)
CO2: 24 mmol/L (ref 21–32)
Calcium: 10.3 MG/DL — ABNORMAL HIGH (ref 8.5–10.1)
Chloride: 101 mmol/L (ref 97–108)
Creatinine: 0.88 MG/DL (ref 0.55–1.02)
Est, Glom Filt Rate: 65 mL/min/{1.73_m2} (ref >60)
Globulin: 3.5 g/dL (ref 2.0–4.0)
Glucose: 105 mg/dL — ABNORMAL HIGH (ref 65–100)
Potassium: 4.3 mmol/L (ref 3.5–5.1)
Sodium: 135 mmol/L — ABNORMAL LOW (ref 136–145)
Total Bilirubin: 0.7 MG/DL (ref 0.2–1.0)
Total Protein: 8.3 g/dL — ABNORMAL HIGH (ref 6.4–8.2)

## 2022-12-27 LAB — TSH: TSH, 3RD GENERATION: 1.12 u[IU]/mL (ref 0.36–3.74)

## 2022-12-27 LAB — VITAMIN D 25 HYDROXY: Vit D, 25-Hydroxy: 31.5 ng/mL (ref 30–100)

## 2023-01-03 ENCOUNTER — Ambulatory Visit: Admit: 2023-01-03 | Discharge: 2023-01-03 | Payer: MEDICARE | Attending: Family Medicine | Primary: Family Medicine

## 2023-01-03 DIAGNOSIS — I10 Essential (primary) hypertension: Secondary | ICD-10-CM

## 2023-01-03 DIAGNOSIS — R21 Rash and other nonspecific skin eruption: Secondary | ICD-10-CM

## 2023-01-03 MED ORDER — AMLODIPINE BESYLATE 10 MG PO TABS
10 MG | ORAL_TABLET | Freq: Every day | ORAL | 3 refills | Status: DC
Start: 2023-01-03 — End: 2023-12-14

## 2023-01-03 NOTE — Progress Notes (Signed)
Chief Complaint   Patient presents with    6 Month Follow-Up     Pt declines medicare wellness visit today   Pt has spots her legs/arms she would like provider to look at, podiatrist said it might be ringworm and prescribed clotrimazole and betamethasone dipropionate cream 1%/0.05%, has not gotten better.  Pt has questions to why she had blood work done twice in October 2023. Pt reports that her labs were only drawn once she thinks.     Pt has labs drawn in advance of appt.     Subjective: (As above and below)     Chief Complaint   Patient presents with    6 Month Follow-Up     she is a 82 y.o. year old female who presents for evaluation.    Reviewed PmHx, RxHx, FmHx, SocHx, AllgHx and updated in chart.    Review of Systems - negative except as listed above    Objective:     Vitals:    01/03/23 1004 01/03/23 1013   BP: (!) 172/91 (!) 173/80   Site: Right Upper Arm Left Upper Arm   Position: Sitting Sitting   Cuff Size: Large Adult Medium Adult   Pulse: 88    Resp: 16    SpO2: 95%    Weight: 75.7 kg (166 lb 12.8 oz)    Height: 1.588 m (5' 2.5")      Physical Examination: General appearance - alert, well appearing, and in no distress  Mental status - normal mood, behavior, speech, dress, motor activity, and thought processes  Chest - clear to auscultation, no wheezes, rales or rhonchi, symmetric air entry  Heart - normal rate, regular rhythm, normal S1, S2, no murmurs, rubs, clicks or gallops  Musculoskeletal - walks with a walker  Skin - multiple patches of erythematous skin with white scale, on all extremities     Assessment/ Plan:   1. Skin rash  -refer for further evaluation  - External Referral To Dermatology  - External Referral To Dermatology    2. Scaly patch rash  -refer for further eval  - External Referral To Dermatology  - External Referral To Dermatology    3. Essential hypertension  -elevated in office, asked pt to monitor her blood pressure at home, take medication as written  - amLODIPine (NORVASC) 10  MG tablet; Take 1 tablet by mouth daily  Dispense: 90 tablet; Refill: 3    4. Hypercholesterolemia  -reviewed recent lab results    5. Vitamin D deficiency, unspecified  -levels have improved.      6. Situational anxiety  -discussed weaning down medication, pt is open to trying  -discussed taper, half a tablet as needed    Return if symptoms worsen or fail to improve.    I have discussed the diagnosis with the patient and the intended plan as seen in the above orders.  The patient has received an after-visit summary and questions were answered concerning future plans.     Medication Side Effects and Warnings were discussed with patient: yes  Patient Labs were reviewed: yes  Patient Past Records were reviewed:  yes    Pat Patrick, M.D.

## 2023-01-03 NOTE — Progress Notes (Signed)
Chief Complaint   Patient presents with    6 Month Follow-Up     Pt declines medicare wellness visit today   Pt has spots her legs/arms she would like provider to look at, podiatrist said ti might be ringworm and prescribed clotrimazole and betamethasone dipropionate cream 1%/0.05%, has not gotten better   Pt has questions to why she had blood work done twice in October 2023     Health Maintenance Due   Topic Date Due    Respiratory Syncytial Virus (RSV) Pregnant or age 85 yrs+ (1 - 1-dose 60+ series) Never done    DTaP/Tdap/Td vaccine (1 - Tdap) 01/06/2017    COVID-19 Vaccine (4 - 2023-24 season) 05/05/2022    Annual Wellness Visit (Medicare)  12/22/2022         "Have you been to the ER, urgent care clinic since your last visit?  Hospitalized since your last visit?"    NO    "Have you seen or consulted any other health care providers outside of [facility] since your last visit?"    Yes, Dr. Virgel Bouquet podiatrist

## 2023-01-18 MED ORDER — VALSARTAN 160 MG PO TABS
160 MG | ORAL_TABLET | ORAL | 3 refills | Status: DC
Start: 2023-01-18 — End: 2023-12-07

## 2023-03-12 ENCOUNTER — Encounter

## 2023-03-12 MED ORDER — DIAZEPAM 2 MG PO TABS
2 MG | ORAL_TABLET | ORAL | 0 refills | Status: AC
Start: 2023-03-12 — End: 2023-04-11

## 2023-03-19 NOTE — Telephone Encounter (Signed)
Pt is calling wanting to make sure Dr Risser received notes from Dr Luvenia Starch states she will need for her visit with Dr Risser

## 2023-03-21 NOTE — Telephone Encounter (Signed)
Requested records 03/21/23.

## 2023-03-28 ENCOUNTER — Telehealth: Admit: 2023-03-28 | Discharge: 2023-03-28 | Payer: MEDICARE | Attending: Family Medicine | Primary: Family Medicine

## 2023-03-28 DIAGNOSIS — B958 Unspecified staphylococcus as the cause of diseases classified elsewhere: Secondary | ICD-10-CM

## 2023-03-28 MED ORDER — DIAZEPAM 2 MG PO TABS
2 MG | ORAL_TABLET | Freq: Every evening | ORAL | 2 refills | Status: AC | PRN
Start: 2023-03-28 — End: 2023-07-10

## 2023-03-28 NOTE — Progress Notes (Signed)
Chief Complaint   Patient presents with    Rash     Pt was seen via virtual video visit.    Pt reports that her dermatologist diagnosed her with a staph infection of the skin.     Pt was started on Augmentin, this caused nausea and diarrhea. Medication was stopped.     Pt has now been given a topical antibiotic, has not yet started.     Pt is concerned about severity of infection, unsure about topical treatment.     03/28/2023    TELEHEALTH EVALUATION -- Audio/Visual    HPI:    Lindsay Berry (DOB:  10/27/1940) has requested an audio/video evaluation for the following concern(s):    As above    Review of Systems   Skin:  Positive for rash.       Prior to Visit Medications    Medication Sig Taking? Authorizing Provider   clobetasol (TEMOVATE) 0.05 % cream APPLY TO AFFECTED AREA TO RED,SCALY PLAQUES TWICE A DAY UNTIL RESOLVED THEN STOP Yes [provider]   mupirocin (BACTROBAN) 2 % ointment  Yes [provider]   diazePAM (VALIUM) 2 MG tablet TAKE 1/2-1 TABLET BY MOUTH EVERY 12 HOURS AS NEEDED FOR ANXIETY Yes Jakaleb Payer B, MD   valsartan (DIOVAN) 160 MG tablet TAKE 1 TABLET BY MOUTH IN THE  MORNING Yes Shaasia Odle B, MD   amLODIPine (NORVASC) 10 MG tablet Take 1 tablet by mouth daily Yes Nalina Yeatman B, MD   pantoprazole (PROTONIX) 40 MG tablet TAKE 1 TABLET BY MOUTH TWICE  DAILY Yes Katherinne Mofield, Sherrlyn Hock, MD   aspirin 81 MG EC tablet Take 1 tablet by mouth daily Yes Automatic Reconciliation, Ar   Cholecalciferol 50 MCG (2000 UT) CAPS Take 1 capsule by mouth daily Yes Automatic Reconciliation, Ar   olopatadine (PATADAY) 0.2 % SOLN ophthalmic solution Apply 1 drop to eye daily Yes Automatic Reconciliation, Ar   Red Yeast Rice Extract 600 MG CAPS Take 4 capsules by mouth daily Yes Automatic Reconciliation, Ar       Social History     Tobacco Use    Smoking status: Never     Passive exposure: Never    Smokeless tobacco: Never   Vaping Use    Vaping Use: Never used   Substance Use Topics     Alcohol use: Never    Drug use: Never        Past Medical History:   Diagnosis Date    Arthritis     Colon polyps     Diastolic dysfunction without heart failure     Hypercholesterolemia     Hypertension     Hyponatremia     Insomnia     Meniere disease, unspecified laterality     PAD (peripheral artery disease) (HCC)     Situational anxiety     Stenosis of right carotid artery     Thyroid disease        PHYSICAL EXAMINATION:  [ INSTRUCTIONS:  "[x] " Indicates a positive item  "[] " Indicates a negative item  -- DELETE ALL ITEMS NOT EXAMINED]  Vital Signs: (As obtained by patient/caregiver or practitioner observation)    Blood pressure-  Heart rate-    Respiratory rate-    Temperature-  Pulse oximetry-     Constitutional: [x]  Appears well-developed and well-nourished []  No apparent distress      []  Abnormal-   Mental status  [x]  Alert and awake  [x]  Oriented to person/place/time [] Able to  follow commands      Eyes:  EOM    [x]   Normal  []  Abnormal-  Sclera  [x]   Normal  []  Abnormal -         Discharge []   None visible  []  Abnormal -    HENT:   [x]  Normocephalic, atraumatic.  []  Abnormal   []  Mouth/Throat: Mucous membranes are moist.     External Ears [x]  Normal  []  Abnormal-     Neck: [x]  No visualized mass     Pulmonary/Chest: [x]  Respiratory effort normal.  []  No visualized signs of difficulty breathing or respiratory distress        []  Abnormal-      Musculoskeletal:   [x]  Normal gait with no signs of ataxia         []  Normal range of motion of neck        []  Abnormal-       Neurological:        [x]  No Facial Asymmetry (Cranial nerve 7 motor function) (limited exam to video visit)          []  No gaze palsy        []  Abnormal-         Skin:        [x]  No significant exanthematous lesions or discoloration noted on facial skin         []  Abnormal-            Psychiatric:       [x]  Normal Affect []  No Hallucinations        []  Abnormal-     Other pertinent observable physical exam findings-     ASSESSMENT/PLAN:  1.  Staph skin infection  -reviewed treatment  -agree with plan for topical treatment  -discussed where infection could be from      2. Chronic insomnia  -take as needed at night  -refill provided  -PMP reviewed  - diazePAM (VALIUM) 2 MG tablet; Take 1 tablet by mouth nightly as needed for Anxiety for up to 90 days. Max Daily Amount: 2 mg  Dispense: 30 tablet; Refill: 2      No follow-ups on file.    [employer], was evaluated through a synchronous (real-time) audio-video encounter. The patient (or guardian if applicable) is aware that this is a billable service, which includes applicable co-pays. This Virtual Visit was conducted with patient's (and/or legal guardian's) consent. Patient identification was verified, and a caregiver was present when appropriate.   The patient was located at Home: [address]  [place] [address]  Provider was located at [employer] (Appt Dept): [address]  [place],  [place] [address]  Confirm you are appropriately licensed, registered, or certified to deliver care in the state where the patient is located as indicated above. If you are not or unsure, please re-schedule the visit: Yes, I confirm.       Total time spent on this encounter: Not billed by time    --Pat Patrick, MD on 03/28/2023 at 12:14 PM    An electronic signature was used to authenticate this note.

## 2023-03-28 NOTE — Progress Notes (Signed)
Chief Complaint   Patient presents with    Rash         Health Maintenance Due   Topic Date Due    Respiratory Syncytial Virus (RSV) Pregnant or age 84 yrs+ (1 - 1-dose 60+ series) Never done    DTaP/Tdap/Td vaccine (1 - Tdap) 01/06/2017    COVID-19 Vaccine (5 - 2023-24 season) 05/05/2022    Annual Wellness Visit (Medicare)  12/22/2022         "Have you been to the ER, urgent care clinic since your last visit?  Hospitalized since your last visit?"    NO    "Have you seen or consulted any other health care providers outside of [facility] since your last visit?"    Yes [facility]

## 2023-04-13 NOTE — Telephone Encounter (Signed)
-----   Message from Jenel Lucks Amor sent at 04/13/2023  3:06 PM EDT -----  Regarding: [facility] Appointment Request  [facility] Appointment Request    Patient needs appointment for [facility] Appointment Type: Existing Condition Follow Up.    Patient Requested Dates(s): PCP has available on August 26, patient want sooner than that.  Patient Requested Time: Any time   Provider Name: Risser, Sherrlyn Hock    Reason for Appointment Request: Established Patient - Available appointments did not meet patient need    Additional Information: Patient called and want to book an appointment for a videio visit.     --------------------------------------------------------------------------------------------------------------------------    Relationship to Patient: Self     Call Back Information: OK to leave message on voicemail  Preferred Call Back Number: Phone [phone]

## 2023-05-29 ENCOUNTER — Telehealth: Payer: Medicare Other | Admitting: Primary Care

## 2023-05-29 ENCOUNTER — Encounter: Payer: Self-pay | Admitting: Primary Care

## 2023-05-29 VITALS — Ht 62.5 in | Wt 162.0 lb

## 2023-05-29 DIAGNOSIS — G4733 Obstructive sleep apnea (adult) (pediatric): Secondary | ICD-10-CM | POA: Diagnosis not present

## 2023-05-29 DIAGNOSIS — I340 Nonrheumatic mitral (valve) insufficiency: Secondary | ICD-10-CM | POA: Insufficient documentation

## 2023-05-29 DIAGNOSIS — I998 Other disorder of circulatory system: Secondary | ICD-10-CM | POA: Insufficient documentation

## 2023-05-29 DIAGNOSIS — E079 Disorder of thyroid, unspecified: Secondary | ICD-10-CM | POA: Insufficient documentation

## 2023-05-29 NOTE — Progress Notes (Addendum)
Virtual Visit via Video Note  I connected with [name] [name] on 05/29/23 at  2:00 PM EDT by a video enabled telemedicine application and verified that I am speaking with the correct person using two identifiers.  Location: Patient: Home Provider: Office   I discussed the limitations of evaluation and management by telemedicine and the availability of in person appointments. The patient expressed understanding and agreed to proceed.  History of Present Illness: 82 year old female, never smoked. PMH significant for HTN, vascular insufficiency, OSA on CPAP, diverticular disease, multiple thyroid nodules, diastolic dysfunction. Patient of Dr. Belia Heman, last seen 05/09/22  05/29/2023 Patient contacted today for OSA follow-up.  Patient on auto CPAP 5 to 15 cm H2O. She received replacement CPAP machine 1-2 years ago. Machine is working without any issues. She has an airsense 11. No compliance report was available during our visit. Needs to be re-uploaded to resmed airview. She reports 100% compliance with CPAP, wearing approx 7-8 hours. Typical bedtime 10pm. It will take her <5 mins to fall asleep. She sleeps well through the night without disruption. She starts her day 6-7am. No daytime sleepiness. No issues with mask fit or pressure settings. No airleaks.   Airview download 04/28/2023 - 05/27/2023 Usage days 30/30 days (100%); 29 days (97%) greater than 4 hours Average usage 5 hours 51 minutes Pressure 5 to 15 cm H2O (14.9 cm H2O-95%) Air leaks 20.4 L (95%) AHI 2.3   Observations/Objective:  Appears well; No overt respiratory symptoms   Assessment and Plan:  OSA She is 100% compliant with use, average usage 5 hours 51 mins  She is benefiting from PAP therapy No issues with mask fit or pressure settings Current pressure auto settings 5-15cm h20; Residual AHI 2.3 No changes today, advised patient continue to wear CPAP nightly DME company is [employer]  Follow Up Instructions:   1 year with Dr.  Belia Heman or sooner if needed  I discussed the assessment and treatment plan with the patient. The patient was provided an opportunity to ask questions and all were answered. The patient agreed with the plan and demonstrated an understanding of the instructions.   The patient was advised to call back or seek an in-person evaluation if the symptoms worsen or if the condition fails to improve as anticipated.  I provided 22 minutes of non-face-to-face time during this encounter.   Glenford Bayley, NP

## 2023-05-29 NOTE — Patient Instructions (Signed)
Download looked good No changes Continue to wear CPAP nightly  Follow-up 1 year with Dr. Belia Heman or Waynetta Sandy NP

## 2023-06-07 ENCOUNTER — Telehealth

## 2023-06-07 NOTE — Telephone Encounter (Signed)
 Pt called in to schedule lab appt before office visit. Pt has lab appt on 10/18. Pt wants labs done before ov on 10/23. Please put labs orders in.

## 2023-06-08 NOTE — Addendum Note (Signed)
Addended by: Pat Patrick on: 06/08/2023 10:20 AM     Modules accepted: Orders

## 2023-06-08 NOTE — Telephone Encounter (Signed)
Labs ordered.

## 2023-06-22 ENCOUNTER — Ambulatory Visit: Payer: MEDICARE | Primary: Family Medicine

## 2023-06-27 ENCOUNTER — Ambulatory Visit: Payer: MEDICARE | Attending: Family Medicine | Primary: Family Medicine

## 2023-06-28 ENCOUNTER — Ambulatory Visit: Admit: 2023-06-28 | Payer: MEDICARE | Primary: Family Medicine

## 2023-06-28 DIAGNOSIS — E559 Vitamin D deficiency, unspecified: Secondary | ICD-10-CM

## 2023-06-29 LAB — COMPREHENSIVE METABOLIC PANEL
ALT: 40 U/L (ref 12–78)
AST: 23 U/L (ref 15–37)
Albumin/Globulin Ratio: 1.3 (ref 1.1–2.2)
Albumin: 4.6 g/dL (ref 3.5–5.0)
Alk Phosphatase: 122 U/L — ABNORMAL HIGH (ref 45–117)
Anion Gap: 8 mmol/L (ref 2–12)
BUN/Creatinine Ratio: 22 — ABNORMAL HIGH (ref 12–20)
BUN: 21 mg/dL — ABNORMAL HIGH (ref 6–20)
CO2: 27 mmol/L (ref 21–32)
Calcium: 9.7 mg/dL (ref 8.5–10.1)
Chloride: 102 mmol/L (ref 97–108)
Creatinine: 0.97 mg/dL (ref 0.55–1.02)
Est, Glom Filt Rate: 58 mL/min/{1.73_m2} — ABNORMAL LOW (ref >60)
Globulin: 3.5 g/dL (ref 2.0–4.0)
Glucose: 96 mg/dL (ref 65–100)
Potassium: 4.4 mmol/L (ref 3.5–5.1)
Sodium: 137 mmol/L (ref 136–145)
Total Bilirubin: 0.6 mg/dL (ref 0.2–1.0)
Total Protein: 8.1 g/dL (ref 6.4–8.2)

## 2023-06-29 LAB — CBC WITH AUTO DIFFERENTIAL
Basophils %: 1 % (ref 0–1)
Basophils Absolute: 0.1 10*3/uL (ref 0.0–0.1)
Eosinophils %: 1 % (ref 0–7)
Eosinophils Absolute: 0.1 10*3/uL (ref 0.0–0.4)
Hematocrit: 44.3 % (ref 35.0–47.0)
Hemoglobin: 14.7 g/dL (ref 11.5–16.0)
Immature Granulocytes %: 0 % (ref 0.0–0.5)
Immature Granulocytes Absolute: 0 10*3/uL (ref 0.00–0.04)
Lymphocytes %: 41 % (ref 12–49)
Lymphocytes Absolute: 2.7 10*3/uL (ref 0.8–3.5)
MCH: 30.2 pg (ref 26.0–34.0)
MCHC: 33.2 g/dL (ref 30.0–36.5)
MCV: 91 fL (ref 80.0–99.0)
MPV: 9.4 fL (ref 8.9–12.9)
Monocytes %: 8 % (ref 5–13)
Monocytes Absolute: 0.5 10*3/uL (ref 0.0–1.0)
Neutrophils %: 49 % (ref 32–75)
Neutrophils Absolute: 3.2 10*3/uL (ref 1.8–8.0)
Nucleated RBCs: 0 /100{WBCs}
Platelets: 330 10*3/uL (ref 150–400)
RBC: 4.87 M/uL (ref 3.80–5.20)
RDW: 13 % (ref 11.5–14.5)
WBC: 6.5 10*3/uL (ref 3.6–11.0)
nRBC: 0 10*3/uL (ref 0.00–0.01)

## 2023-06-29 LAB — LIPID PANEL
Chol/HDL Ratio: 2.2 (ref 0.0–5.0)
Cholesterol, Total: 200 mg/dL — ABNORMAL HIGH (ref <200)
HDL: 89 mg/dL
LDL Cholesterol: 84.8 mg/dL (ref 0–100)
Triglycerides: 131 mg/dL (ref <150)
VLDL Cholesterol Calculated: 26.2 mg/dL

## 2023-06-29 LAB — TSH: TSH, 3rd Generation: 0.99 u[IU]/mL (ref 0.36–3.74)

## 2023-06-29 LAB — VITAMIN D 25 HYDROXY: Vit D, 25-Hydroxy: 32.5 ng/mL (ref 30–100)

## 2023-07-04 ENCOUNTER — Ambulatory Visit: Admit: 2023-07-04 | Discharge: 2023-07-04 | Payer: MEDICARE | Attending: Family Medicine | Primary: Family Medicine

## 2023-07-04 DIAGNOSIS — Z23 Encounter for immunization: Secondary | ICD-10-CM

## 2023-07-04 LAB — AMB POC URINALYSIS DIP STICK AUTO W/O MICRO
Bilirubin, Urine, POC: NEGATIVE
Blood, Urine, POC: NEGATIVE
Glucose, Urine, POC: NEGATIVE
Ketones, Urine, POC: NEGATIVE
Leukocyte Esterase, Urine, POC: NEGATIVE
Nitrite, Urine, POC: NEGATIVE
Protein, Urine, POC: NEGATIVE
Specific Gravity, Urine, POC: 1.015 (ref 1.001–1.035)
Urobilinogen, POC: 0.2
pH, Urine, POC: 7 (ref 4.6–8.0)

## 2023-07-04 NOTE — Progress Notes (Signed)
Chief Complaint   Patient presents with    Follow-up     Pt declines medicare wellness today   Pt states she has had some shaking in her left arm, comes and goes   Pt states she wakes up in the middle of the night having to urinate    Health Maintenance Due   Topic Date Due    DEXA (modify frequency per FRAX score)  Never done    Respiratory Syncytial Virus (RSV) Pregnant or age 84 yrs+ (1 - 1-dose 60+ series) Never done    Shingles vaccine (2 of 3) 03/23/2008    DTaP/Tdap/Td vaccine (1 - Tdap) 01/06/2017    Annual Wellness Visit (Medicare)  12/22/2022    Flu vaccine (1) 04/05/2023    COVID-19 Vaccine (5 - 2023-24 season) 05/06/2023         "Have you been to the ER, urgent care clinic since your last visit?  Hospitalized since your last visit?"    NO    "Have you seen or consulted any other health care providers outside of [facility] since your last visit?"    NO

## 2023-07-04 NOTE — Progress Notes (Signed)
Chief Complaint   Patient presents with    Follow-up     Pt declines medicare wellness today     Pt states she has had some shaking in her left arm, comes and goes. Pt reports that it has started since her last visit. Not present today. Worse when cold, when straining to pick up an item, somewhat positional. No tremor at night, but will often be there in the morning.     Pt states she wakes up in the middle of the night having to urinate    Subjective: (As above and below)     Chief Complaint   Patient presents with    Follow-up     she is a 82 y.o. year old female who presents for evaluation.    Reviewed PmHx, RxHx, FmHx, SocHx, AllgHx and updated in chart.    Review of Systems - negative except as listed above    Objective:     Vitals:    07/04/23 0938 07/04/23 0950   BP: (!) 161/85 (!) 157/86   Site: Right Upper Arm Right Upper Arm   Position: Sitting Sitting   Cuff Size: Medium Adult Medium Adult   Pulse: 81    Resp: 16    SpO2: 99%    Weight: 74.1 kg (163 lb 6.4 oz)    Height: 1.588 m (5' 2.5")      Physical Examination: General appearance - alert, well appearing, and in no distress  Mental status - normal mood, behavior, speech, dress, motor activity, and thought processes  Mouth - mucous membranes moist, pharynx normal without lesions  Chest - clear to auscultation, no wheezes, rales or rhonchi, symmetric air entry  Heart - normal rate, regular rhythm, normal S1, S2, no murmurs, rubs, clicks or gallops  Musculoskeletal - no joint tenderness, deformity or swelling  Extremities - peripheral pulses normal, no pedal edema, no clubbing or cyanosis    Assessment/ Plan:   1. Needs flu shot  - Influenza, FLUAD Trivalent, (age 14 y+), IM, Preservative Free, 0.17mL    2. Essential hypertension  -white coat, home readings well controlled    3. Hypercholesterolemia  -continue on current medications    4. Vitamin D deficiency, unspecified  -stable    5. Tremor  -discussed at length, pt unsure if she wants to do anything  more about symptoms   - [facility] - Lonn Georgia, ANP, Neurology, [place]    6. Nocturia  -check UA  - AMB POC URINALYSIS DIP STICK AUTO W/O MICRO       Return in about 6 months (around 01/02/2024), or if symptoms worsen or fail to improve.    I have discussed the diagnosis with the patient and the intended plan as seen in the above orders.  The patient has received an after-visit summary and questions were answered concerning future plans.     Medication Side Effects and Warnings were discussed with patient: yes  Patient Labs were reviewed: yes  Patient Past Records were reviewed:  yes    Pat Patrick, M.D.

## 2023-07-05 NOTE — Progress Notes (Signed)
Advance Care Planning   Ambulatory ACP Specialist Patient Outreach    Date:  07/05/2023    ACP Specialist:  Elio Forget    Outreach call to patient in follow-up to ACP Specialist referral from:Risser, Sherrlyn Hock, MD    [x]  PCP  []  Provider   []  Ambulatory Care Management []  Other     For:                  [x]  Advance Directive Assistance              []  Complete Portable DNR order              []  Complete POST/POLST/MOST              []  Code Status Discussion             []  Discuss Goals of Care             []  Early ACP Decision-Making              []  Other (Specify)    Date Referral Received:07/05/23    Next Step:   [x]  ACP scheduled conversation  []  Outreach again in one week               [x]  Email / Mail ACP Info Sheets  [x]  Email / Mail Advance Directive   []  Closing referral.  Routing closure to referring provider/staff and to Lexicographer.    []  Closure letter mailed to patient with invitation to contact ACP Specialist if / when ready.   []  Other (Specify here):       [x]  At this time, Healthcare Decision Maker Is:   Primary Decision MakerRogene Houston - Child - [phone]         []  Primary agent named in scanned advance directive.    [x]  Legal Next of Kin.     []  Unable to determine legal decision maker at this time.    Outreaches:         [x]  1st -  Date:  07/05/23               Intervention:  [x]  Spoke with Patient   []  Left Voice mail []  Email / Mail    []  MyChart  []  Other (Specify) :     Outcomes:  Spoke with patient on home phone number scheduling a telephone conversation with ACP Specialist Jackquline Berlin on Monday, 07/16/23 at 12:30 p.m.              []  2nd -  Date:                 Intervention:  []  Spoke with Patient  []  Left Voice mail []  Email / Mail    []  MyChart  []  Other (Specify) :              Outcomes:                []  3rd -  Date:                Intervention:  []  Spoke with Patient   []  Left Voice mail []  Email / Mail    []  MyChart  []  Other (Specify) :        Outcomes:           []   Additional Outreach -  Date:     (Specify Dates & special circumstances):    Outcomes:  Thank you for this referral.

## 2023-07-09 ENCOUNTER — Encounter

## 2023-07-09 MED ORDER — DIAZEPAM 2 MG PO TABS
2 MG | ORAL_TABLET | Freq: Every evening | ORAL | 2 refills | Status: AC | PRN
Start: 2023-07-09 — End: 2023-10-07

## 2023-07-16 NOTE — ACP (Advance Care Planning) (Addendum)
Advance Care Planning     Advance Care Planning Clinical Specialist  Conversation Note      Date of ACP Conversation: 07/16/2023; 09/13/2023    Conversation Conducted with: Patient with Decision Making Capacity    ACP Clinical Specialist: Guadlupe Spanish, LCSW    Healthcare Decision Maker:     Current Designated Healthcare Decision Maker:     Primary Decision Maker: Lindsay Berry - Child [phone]    Care Preferences    Ventilation:  "If you were in your present state of health and suddenly became very ill and were unable to breathe on your own, what would your preference be about the use of a ventilator (breathing machine) if it were available to you?"      If the patient would desire the use of ventilator (breathing machine), answer "yes".  If not, "no": Health Care Preferences were not decided during this ACP appointment.      "If your health worsens and it becomes clear that your chance of recovery is unlikely, what would your preference be about the use of a ventilator (breathing machine) if it were available to you?"     Would the patient desire the use of ventilator (breathing machine)?: Health Care Preferences were not decided during this ACP appointment.      Resuscitation  "CPR works best to restart the heart when there is a sudden event, like a heart attack, in someone who is otherwise healthy. Unfortunately, CPR does not typically restart the heart for people who have serious health conditions or who are very sick."    "In the event your heart stopped as a result of an underlying serious health condition, would you want attempts to be made to restart your heart (answer "yes" for attempt to resuscitate) or would you prefer a natural death (answer "no" for do not attempt to resuscitate)?" Health Care Preferences were not decided during this ACP appointment.       [x]  Yes   []  No   Educated Patient / Decision Maker regarding differences between Advance Directives and portable DNR orders.    Length of  ACP Conversation in minutes:  35    Conversation Outcomes:  ACP discussion completed    Follow-up plan:    []  Schedule follow-up conversation to continue planning  []  Referred individual to Provider for additional questions/concerns   [x]  Advised patient/agent/surrogate to review completed ACP document and update if needed with changes in condition, patient preferences or care setting    [x]  This note routed to one or more involved healthcare providers    ACP Specialist met with patient for scheduled Advance Care Planning appointment.  Pt was able to complete ACP discussion and discussed the [place] for [place] document during today's appointment.  Pt presented as alert and oriented throughout this entire conversation.    Pt reports that her legal next of kin is her daughter, Lindsay Berry and aware that she is considered her primary health care agent until a [place] [employer] [place] [employer] document is completed.  Pt reports having an advance medical directive from [place] but wanted to complete a [place] for [employer] document and make updates.      Pt reports receiving copy of the [place] document through her email but only able to access the documents on her phone and it was too small to read.  Pt was informed that a document was also mailed through the [place]  [employer] and she would like to review this document prior to moving forward.      Plan: Pt feels that she should be capable of completing the document on her own but was provided contact information if she finds she has additional questions or needs more assistance with the completion of her [place] [employer] [place] document.  This referral will be closed.     Update 09/13/2023 - ACP Specialist received telephone message from patient asking for a telephone call to discuss her [place] document.  Telephone call was  made to patient's home number listed in the medical record and ACP Specialist spoke with patient further about her [place] document and how to complete the document.  Pt's questions were answered and she plans to sit with her daughter to get document completed this weekend.  This referral will remain closed.     [place] Par[place]. Bettey Costa, LISW-CP  Advance Care Planning Specialist  [facility] [employer] [employer]  [phone]

## 2023-08-20 ENCOUNTER — Telehealth
Admit: 2023-08-20 | Discharge: 2023-08-20 | Payer: MEDICARE | Attending: Family Medicine | Admitting: Family Medicine | Primary: Family Medicine

## 2023-08-20 DIAGNOSIS — I10 Essential (primary) hypertension: Principal | ICD-10-CM

## 2023-08-20 NOTE — Progress Notes (Signed)
 Lindsay Berry is a 82 y.o. female    Chief Complaint   Patient presents with    Referral - General     cardiology     There were no vitals filed for this visit.      Health Maintenance Due   Topic Date Due    DEXA (modify frequency per FRAX score)  Never d

## 2023-08-20 NOTE — Progress Notes (Signed)
 Chief Complaint   Patient presents with    Referral - General     cardiology     Pt was seen via video visit.     Pt reports that her health has been good, she feels she is in a better place than she has been in years. Emotional and mental wellbeing are ex

## 2023-08-31 ENCOUNTER — Other Ambulatory Visit: Admit: 2023-08-31 | Discharge: 2023-08-31 | Payer: MEDICARE | Primary: Family Medicine

## 2023-08-31 DIAGNOSIS — R5383 Other fatigue: Secondary | ICD-10-CM

## 2023-09-01 LAB — COMPREHENSIVE METABOLIC PANEL
ALT: 31 U/L (ref 12–78)
AST: 17 U/L (ref 15–37)
Albumin/Globulin Ratio: 1.4 (ref 1.1–2.2)
Albumin: 4.5 g/dL (ref 3.5–5.0)
Alk Phosphatase: 120 U/L — ABNORMAL HIGH (ref 45–117)
Anion Gap: 4 mmol/L (ref 2–12)
BUN/Creatinine Ratio: 24 — ABNORMAL HIGH (ref 12–20)
BUN: 20 mg/dL (ref 6–20)
CO2: 29 mmol/L (ref 21–32)
Calcium: 10.4 mg/dL — ABNORMAL HIGH (ref 8.5–10.1)
Chloride: 101 mmol/L (ref 97–108)
Creatinine: 0.85 mg/dL (ref 0.55–1.02)
Est, Glom Filt Rate: 68 mL/min/{1.73_m2} (ref >60)
Globulin: 3.3 g/dL (ref 2.0–4.0)
Glucose: 76 mg/dL (ref 65–100)
Potassium: 4.5 mmol/L (ref 3.5–5.1)
Sodium: 134 mmol/L — ABNORMAL LOW (ref 136–145)
Total Bilirubin: 0.6 mg/dL (ref 0.2–1.0)
Total Protein: 7.8 g/dL (ref 6.4–8.2)

## 2023-09-01 LAB — VITAMIN B12 & FOLATE
Folate: 7.8 ng/mL (ref 5.0–21.0)
Vitamin B-12: 291 pg/mL (ref 193–986)

## 2023-09-01 LAB — IRON AND TIBC
Iron % Saturation: 22 % (ref 20–50)
Iron: 89 ug/dL (ref 35–150)
TIBC: 410 ug/dL (ref 250–450)

## 2023-09-13 MED ORDER — PANTOPRAZOLE SODIUM 40 MG PO TBEC
40 | ORAL_TABLET | Freq: Two times a day (BID) | ORAL | 3 refills | 90.00000 days | Status: DC
Start: 2023-09-13 — End: 2024-02-19

## 2023-10-08 ENCOUNTER — Encounter

## 2023-10-09 MED ORDER — DIAZEPAM 2 MG PO TABS
2 | ORAL_TABLET | Freq: Every evening | ORAL | 0 refills | Status: AC | PRN
Start: 2023-10-09 — End: 2023-11-13

## 2023-11-09 ENCOUNTER — Encounter

## 2023-11-09 MED ORDER — DIAZEPAM 2 MG PO TABS
2 MG | ORAL_TABLET | ORAL | 0 refills | Status: DC
Start: 2023-11-09 — End: 2023-12-11

## 2023-12-07 MED ORDER — VALSARTAN 160 MG PO TABS
160 | ORAL_TABLET | Freq: Every morning | ORAL | 3 refills | 60.00000 days | Status: DC
Start: 2023-12-07 — End: 2024-01-07

## 2023-12-10 ENCOUNTER — Encounter

## 2023-12-11 ENCOUNTER — Telehealth: Admit: 2023-12-11 | Discharge: 2023-12-11 | Payer: MEDICARE | Attending: Family Medicine | Primary: Family Medicine

## 2023-12-11 DIAGNOSIS — F5104 Psychophysiologic insomnia: Secondary | ICD-10-CM

## 2023-12-11 MED ORDER — DIAZEPAM 2 MG PO TABS
2 | ORAL_TABLET | Freq: Every evening | ORAL | 0 refills | 7.00000 days | Status: DC | PRN
Start: 2023-12-11 — End: 2024-02-19

## 2023-12-11 NOTE — Progress Notes (Signed)
 Chief Complaint   Patient presents with    Medication Check     Pt was seen via virtual video visit.     Pt has been working with her cardiologist, has carotid testing scheduled.     Pt has been sleeping well, not able to sleep without valium.   Discussed use at length, pt has not tried taking half dose.     12/11/2023    TELEHEALTH EVALUATION -- Audio/Visual    HPI:    Lindsay Berry (DOB:  March 27, 1941) has requested an audio/video evaluation for the following concern(s):    As above     Review of Systems   All other systems reviewed and are negative.      Prior to Visit Medications    Medication Sig Taking? Authorizing Provider   diazePAM (VALIUM) 2 MG tablet Take 1 tablet by mouth nightly as needed for Anxiety for up to 90 days. Max Daily Amount: 2 mg Yes Suprena Travaglini B, MD   valsartan (DIOVAN) 160 MG tablet TAKE 1 TABLET BY MOUTH IN THE  MORNING Yes Kayna Suppa B, MD   pantoprazole (PROTONIX) 40 MG tablet TAKE 1 TABLET BY MOUTH TWICE  DAILY Yes Anaijah Augsburger B, MD   Acetaminophen (TYLENOL ARTHRITIS PAIN PO) Take by mouth daily Yes [provider]   clobetasol (TEMOVATE) 0.05 % cream APPLY TO AFFECTED AREA TO RED,SCALY PLAQUES TWICE A DAY UNTIL RESOLVED THEN STOP Yes [provider]   mupirocin (BACTROBAN) 2 % ointment  Yes [provider]   aspirin 81 MG EC tablet Take 1 tablet by mouth daily Yes Automatic Reconciliation, Ar   Cholecalciferol 50 MCG (2000 UT) CAPS Take 1 capsule by mouth daily Yes Automatic Reconciliation, Ar   olopatadine (PATADAY) 0.2 % SOLN ophthalmic solution Apply 1 drop to eye daily Yes Automatic Reconciliation, Ar   Red Yeast Rice Extract 600 MG CAPS Take 4 capsules by mouth daily Yes Automatic Reconciliation, Ar   amLODIPine (NORVASC) 10 MG tablet TAKE 1 TABLET BY MOUTH DAILY  Jazlynn Nemetz, Sherrlyn Hock, MD       Social History     Tobacco Use    Smoking status: Never     Passive exposure: Never    Smokeless tobacco: Never   Vaping Use    Vaping status: Never Used    Substance Use Topics    Alcohol use: Never    Drug use: Never        PHYSICAL EXAMINATION:  [ INSTRUCTIONS:  "[x] " Indicates a positive item  "[] " Indicates a negative item  -- DELETE ALL ITEMS NOT EXAMINED]  Vital Signs: (As obtained by patient/caregiver or practitioner observation)    Blood pressure-  Heart rate-    Respiratory rate-    Temperature-  Pulse oximetry-     Constitutional: [x]  Appears well-developed and well-nourished []  No apparent distress      []  Abnormal-   Mental status  [x]  Alert and awake  []  Oriented to person/place/time [] Able to follow commands      Eyes:  EOM    [x]   Normal  []  Abnormal-  Sclera  []   Normal  []  Abnormal -         Discharge []   None visible  []  Abnormal -    HENT:   [x]  Normocephalic, atraumatic.  []  Abnormal   []  Mouth/Throat: Mucous membranes are moist.     External Ears [x]  Normal  []  Abnormal-     Neck: [x]  No visualized mass  Pulmonary/Chest: [x]  Respiratory effort normal.  []  No visualized signs of difficulty breathing or respiratory distress        []  Abnormal-      Musculoskeletal:   []  Normal gait with no signs of ataxia         [x]  Normal range of motion of neck        []  Abnormal-       Neurological:        []  No Facial Asymmetry (Cranial nerve 7 motor function) (limited exam to video visit)          []  No gaze palsy        []  Abnormal-         Skin:        [x]  No significant exanthematous lesions or discoloration noted on facial skin         []  Abnormal-            Psychiatric:       [x]  Normal Affect []  No Hallucinations        []  Abnormal-     Other pertinent observable physical exam findings-     ASSESSMENT/PLAN:  1. Chronic insomnia  - reviewed concerns with long term use and risks of long term benzo use.   -asked pt to try taking half a tablet, reviewed other sleep medications, pt declined.   -reviewed sleep hygiene, goal is try to wean use of medication   - diazePAM (VALIUM) 2 MG tablet; Take 1 tablet by mouth nightly as needed for Anxiety for up to 90  days. Max Daily Amount: 2 mg  Dispense: 90 tablet; Refill: 0      Return in about 3 months (around 03/11/2024).    [employer], was evaluated through a synchronous (real-time) audio-video encounter. The patient (or guardian if applicable) is aware that this is a billable service, which includes applicable co-pays. This Virtual Visit was conducted with patient's (and/or legal guardian's) consent. Patient identification was verified, and a caregiver was present when appropriate.   The patient was located at Home: [address]  [place] [address]  Provider was located at [employer] (Appt Dept): [address], [address]  [place],  [place] [address]  Confirm you are appropriately licensed, registered, or certified to deliver care in the state where the patient is located as indicated above. If you are not or unsure, please re-schedule the visit: Yes, I confirm.       Total time spent on this encounter: Not billed by time    --Cosme Dire, MD on 12/18/2023 at 12:13 PM    An electronic signature was used to authenticate this note.

## 2023-12-11 NOTE — Progress Notes (Signed)
 The patient identity was confirmed with DOB and First/Last Name. Medications and Allergies reviewed with patient, as well as any new diagnosis/procedures. Depression Screening and SDOH Screening done today.    Chief Complaint   Patient presents with    Medication Check      Patient has link to visit, will hop on at appointment time.  There were no vitals filed for this visit.    Health Maintenance Due   Topic Date Due    DEXA (modify frequency per FRAX score)  Never done    Shingles vaccine (2 of 3) 03/23/2008    Respiratory Syncytial Virus (RSV) Pregnant or age 85 yrs+ (1 - 1-dose 75+ series) Never done    DTaP/Tdap/Td vaccine (1 - Tdap) 01/06/2017    Annual Wellness Visit (Medicare)  12/22/2022    COVID-19 Vaccine (5 - 2024-25 season) 05/06/2023          "Have you been to the ER, urgent care clinic since your last visit?  Hospitalized since your last visit?"    NO    "Have you seen or consulted any other health care providers outside our system since your last visit?"    NO

## 2023-12-13 ENCOUNTER — Encounter

## 2023-12-14 MED ORDER — AMLODIPINE BESYLATE 10 MG PO TABS
10 MG | ORAL_TABLET | Freq: Every day | ORAL | 3 refills | Status: DC
Start: 2023-12-14 — End: 2024-01-07

## 2024-01-07 ENCOUNTER — Telehealth: Admit: 2024-01-07 | Discharge: 2024-01-07 | Payer: MEDICARE | Attending: Family Medicine | Primary: Family Medicine

## 2024-01-07 DIAGNOSIS — I10 Essential (primary) hypertension: Secondary | ICD-10-CM

## 2024-01-07 MED ORDER — VALSARTAN 320 MG PO TABS
320 | ORAL_TABLET | Freq: Every morning | ORAL | 1 refills | 90.00000 days | Status: DC
Start: 2024-01-07 — End: 2024-02-19

## 2024-01-07 NOTE — Telephone Encounter (Signed)
 Patient had a virtual appointment today and discuss BP medication change. She is requesting to speak with provider regarding the medication changes, she is very worried. Please advise. CB [phone].

## 2024-01-07 NOTE — Progress Notes (Signed)
 Chief Complaint   Patient presents with    Follow-up     Follow up from Dr Loetta Ringer office visit     Health Maintenance Due   Topic Date Due    DEXA (modify frequency per FRAX score)  Never done    Shingles vaccine (2 of 3) 03/23/2008    Respiratory Syncytial Virus (RSV) Pregnant or age 85 yrs+ (1 - 1-dose 75+ series) Never done    DTaP/Tdap/Td vaccine (1 - Tdap) 01/06/2017    Annual Wellness Visit (Medicare)  12/22/2022    COVID-19 Vaccine (5 - 2024-25 season) 05/06/2023         "Have you been to the ER, urgent care clinic since your last visit?  Hospitalized since your last visit?"    NO    "Have you seen or consulted any other health care providers outside of [facility] since your last visit?"    Yes, Dr Loetta Ringer [facility] cardiology

## 2024-01-07 NOTE — Progress Notes (Signed)
 Pt called requesting a call back - nervous about changing medications, would like to change in small increments, reducing amlodipine  and slowly increasing valsartan .     Goal was to stop amlodipine  to see if this was the source of some swelling.     Pt will consider, provided reassurance. Reviewed concerns

## 2024-01-07 NOTE — Telephone Encounter (Signed)
 Provider will call patient back to further discuss.

## 2024-01-07 NOTE — Progress Notes (Signed)
 Chief Complaint   Patient presents with    Follow-up     Pt was seen via virtual video visit.     Pt had testing done with cardiology last month, pt is still waiting on results.     Pt called office last Monday, pt is reaching out to our office for guidance.     Pt had carotid testing, renin and aldosterone labs.   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     Pt is concerned about swelling in one leg, chronic, worse with certain foods- eating out, processed foods.     Pt reports that overall she feels good, does tire easily.     01/07/2024    TELEHEALTH EVALUATION -- Audio/Visual    HPI:    Lindsay Berry (DOB:  [birth year]) has requested an audio/video evaluation for the following concern(s):    As above    Review of Systems   Constitutional:  Positive for fatigue.   Cardiovascular:  Positive for leg swelling.       Prior to Visit Medications    Medication Sig Taking? Authorizing Provider   valsartan  (DIOVAN ) 320 MG tablet Take 1 tablet by mouth every morning Yes Yadira Hada B, MD   diazePAM  (VALIUM ) 2 MG tablet Take 1 tablet by mouth nightly as needed for Anxiety for up to 90 days. Max Daily Amount: 2 mg Yes Skarlett Sedlacek B, MD   pantoprazole  (PROTONIX ) 40 MG tablet TAKE 1 TABLET BY MOUTH TWICE  DAILY Yes Nykole Matos B, MD   Acetaminophen (TYLENOL ARTHRITIS PAIN PO) Take by mouth as needed Yes [provider]   clobetasol (TEMOVATE) 0.05 % cream APPLY TO AFFECTED AREA TO RED,SCALY PLAQUES TWICE A DAY UNTIL RESOLVED THEN STOP Yes [provider]   mupirocin (BACTROBAN) 2 % ointment  Yes [provider]   aspirin 81 MG EC tablet Take 1 tablet by mouth daily Yes Automatic Reconciliation, Ar   Cholecalciferol 50 MCG (2000 UT) CAPS Take 1 capsule by mouth daily Yes Automatic Reconciliation, Ar   olopatadine (PATADAY) 0.2 % SOLN ophthalmic solution Apply 1 drop to eye daily Yes Automatic Reconciliation, Ar   Red Yeast Rice Extract 600 MG CAPS Take 4 capsules by mouth daily Yes  Automatic Reconciliation, Ar       Social History     Tobacco Use    Smoking status: Never     Passive exposure: Never    Smokeless tobacco: Never   Vaping Use    Vaping status: Never Used   Substance Use Topics    Alcohol use: Never    Drug use: Never          PHYSICAL EXAMINATION:  [ INSTRUCTIONS:  "[x] " Indicates a positive item  "[] " Indicates a negative item  -- DELETE ALL ITEMS NOT EXAMINED]  Vital Signs: (As obtained by patient/caregiver or practitioner observation)    Blood pressure-  Heart rate-    Respiratory rate-    Temperature-  Pulse oximetry-     Constitutional: [x]  Appears well-developed and well-nourished []  No apparent distress      []  Abnormal-   Mental status  [x]  Alert and awake  []  Oriented to person/place/time [] Able to follow commands      Eyes:  EOM    [x]   Normal  []  Abnormal-  Sclera  []   Normal  []  Abnormal -         Discharge []   None visible  []  Abnormal -    HENT:   [  x] Normocephalic, atraumatic.  []  Abnormal   []  Mouth/Throat: Mucous membranes are moist.     External Ears [x]  Normal  []  Abnormal-     Neck: []  No visualized mass     Pulmonary/Chest: [x]  Respiratory effort normal.  []  No visualized signs of difficulty breathing or respiratory distress        []  Abnormal-      Musculoskeletal:   []  Normal gait with no signs of ataxia         [x]  Normal range of motion of neck        []  Abnormal-       Neurological:        [x]  No Facial Asymmetry (Cranial nerve 7 motor function) (limited exam to video visit)          []  No gaze palsy        []  Abnormal-         Skin:        []  No significant exanthematous lesions or discoloration noted on facial skin         []  Abnormal-            Psychiatric:       [x]  Normal Affect []  No Hallucinations        []  Abnormal-     Other pertinent observable physical exam findings-     ASSESSMENT/PLAN:  1. Essential hypertension  -stop amlodipine  due to possible side effect of swelling  -increase valsartan  to 320 as written  -follow up with cardiology    -labs ordered, to be done at patient's convenience       Return in about 4 weeks (around 02/04/2024) for Blood pressure check.    [employer], was evaluated through a synchronous (real-time) audio-video encounter. The patient (or guardian if applicable) is aware that this is a billable service, which includes applicable co-pays. This Virtual Visit was conducted with patient's (and/or legal guardian's) consent. Patient identification was verified, and a caregiver was present when appropriate.   The patient was located at Home: [address]  [place] [address]  Provider was located at Home (Appt Dept State): [place]  Confirm you are appropriately licensed, registered, or certified to deliver care in the state where the patient is located as indicated above. If you are not or unsure, please re-schedule the visit: Yes, I confirm.       Total time spent on this encounter: Not billed by time    --Cosme Dire, MD on 01/07/2024 at 10:17 AM    An electronic signature was used to authenticate this note.

## 2024-01-29 ENCOUNTER — Ambulatory Visit: Payer: MEDICARE | Primary: Family Medicine

## 2024-02-05 ENCOUNTER — Ambulatory Visit: Payer: MEDICARE | Attending: Family Medicine | Primary: Family Medicine

## 2024-02-07 ENCOUNTER — Other Ambulatory Visit: Admit: 2024-02-07 | Discharge: 2024-02-07 | Payer: MEDICARE | Primary: Family Medicine

## 2024-02-07 DIAGNOSIS — I10 Essential (primary) hypertension: Secondary | ICD-10-CM

## 2024-02-08 LAB — HEMOGLOBIN A1C
Estimated Avg Glucose: 105 mg/dL
Hemoglobin A1C: 5.3 % (ref 4.0–5.6)

## 2024-02-08 LAB — CBC WITH AUTO DIFFERENTIAL
Basophils %: 0.9 % (ref 0.0–1.0)
Basophils Absolute: 0.05 10*3/uL (ref 0.00–0.10)
Eosinophils %: 1.9 % (ref 0.0–7.0)
Eosinophils Absolute: 0.11 10*3/uL (ref 0.00–0.40)
Hematocrit: 47.3 % — ABNORMAL HIGH (ref 35.0–47.0)
Hemoglobin: 15.3 g/dL (ref 11.5–16.0)
Immature Granulocytes %: 0.3 % (ref 0.0–0.5)
Immature Granulocytes Absolute: 0.02 10*3/uL (ref 0.00–0.04)
Lymphocytes %: 43.1 % (ref 12.0–49.0)
Lymphocytes Absolute: 2.53 10*3/uL (ref 0.80–3.50)
MCH: 30.4 pg (ref 26.0–34.0)
MCHC: 32.3 g/dL (ref 30.0–36.5)
MCV: 93.8 FL (ref 80.0–99.0)
MPV: 9.4 FL (ref 8.9–12.9)
Monocytes %: 6 % (ref 5.0–13.0)
Monocytes Absolute: 0.35 10*3/uL (ref 0.00–1.00)
Neutrophils %: 47.8 % (ref 32.0–75.0)
Neutrophils Absolute: 2.81 10*3/uL (ref 1.80–8.00)
Nucleated RBCs: 0 /100{WBCs}
Platelets: 320 10*3/uL (ref 150–400)
RBC: 5.04 M/uL (ref 3.80–5.20)
RDW: 12.9 % (ref 11.5–14.5)
WBC: 5.9 10*3/uL (ref 3.6–11.0)
nRBC: 0 10*3/uL (ref 0.00–0.01)

## 2024-02-08 LAB — COMPREHENSIVE METABOLIC PANEL
ALT: 42 U/L (ref 12–78)
AST: 24 U/L (ref 15–37)
Albumin/Globulin Ratio: 1.6 (ref 1.1–2.2)
Albumin: 4.9 g/dL (ref 3.5–5.0)
Alk Phosphatase: 114 U/L (ref 45–117)
Anion Gap: 8 mmol/L (ref 2–12)
BUN/Creatinine Ratio: 25 — ABNORMAL HIGH (ref 12–20)
BUN: 22 mg/dL — ABNORMAL HIGH (ref 6–20)
CO2: 26 mmol/L (ref 21–32)
Calcium: 9.8 mg/dL (ref 8.5–10.1)
Chloride: 96 mmol/L — ABNORMAL LOW (ref 97–108)
Creatinine: 0.87 mg/dL (ref 0.55–1.02)
Est, Glom Filt Rate: 66 mL/min/{1.73_m2} (ref >60)
Globulin: 3.1 g/dL (ref 2.0–4.0)
Glucose: 103 mg/dL — ABNORMAL HIGH (ref 65–100)
Potassium: 4.2 mmol/L (ref 3.5–5.1)
Sodium: 130 mmol/L — ABNORMAL LOW (ref 136–145)
Total Bilirubin: 0.9 mg/dL (ref 0.2–1.0)
Total Protein: 8 g/dL (ref 6.4–8.2)

## 2024-02-08 LAB — LIPID PANEL
Chol/HDL Ratio: 3.1 (ref 0.0–5.0)
Cholesterol, Total: 252 mg/dL — ABNORMAL HIGH (ref <200)
HDL: 81 mg/dL
LDL Cholesterol: 132.2 mg/dL — ABNORMAL HIGH (ref 0–100)
Triglycerides: 194 mg/dL — ABNORMAL HIGH (ref <150)
VLDL Cholesterol Calculated: 38.8 mg/dL

## 2024-02-12 ENCOUNTER — Ambulatory Visit: Payer: MEDICARE | Primary: Family Medicine

## 2024-02-19 ENCOUNTER — Ambulatory Visit: Payer: MEDICARE | Attending: Family Medicine | Primary: Family Medicine

## 2024-02-19 ENCOUNTER — Ambulatory Visit: Admit: 2024-02-19 | Discharge: 2024-02-19 | Payer: MEDICARE | Attending: Family Medicine | Primary: Family Medicine

## 2024-02-19 VITALS — BP 149/81 | HR 80 | Resp 16 | Ht 62.5 in | Wt 164.0 lb

## 2024-02-19 DIAGNOSIS — Z0000 Encounter for general adult medical examination without abnormal findings: Secondary | ICD-10-CM

## 2024-02-19 MED ORDER — DIAZEPAM 2 MG PO TABS
2 | ORAL_TABLET | Freq: Every evening | ORAL | 0 refills | 10.00000 days | Status: DC | PRN
Start: 2024-02-19 — End: 2024-05-13

## 2024-02-19 MED ORDER — PANTOPRAZOLE SODIUM 40 MG PO TBEC
40 | ORAL_TABLET | Freq: Two times a day (BID) | ORAL | 3 refills | 90.00000 days | Status: AC
Start: 2024-02-19 — End: ?

## 2024-02-19 MED ORDER — AMLODIPINE BESYLATE 10 MG PO TABS
10 | ORAL_TABLET | Freq: Every day | ORAL | 3 refills | 90.00000 days | Status: AC
Start: 2024-02-19 — End: ?

## 2024-02-19 MED ORDER — VALSARTAN 160 MG PO TABS
160 | ORAL_TABLET | Freq: Every morning | ORAL | 3 refills | 90.00000 days | Status: AC
Start: 2024-02-19 — End: ?

## 2024-02-19 NOTE — Progress Notes (Signed)
 Chief Complaint   Patient presents with    Medicare AWV    Medication Refill     Pt would like 90 day supply     Health Maintenance Due   Topic Date Due    DEXA (modify frequency per FRAX score)  Never done    Shingles vaccine (2 of 3) 03/23/2008    Respiratory Syncytial Virus (RSV) Pregnant or age 85 yrs+ (1 - 1-dose 75+ series) Never done    DTaP/Tdap/Td vaccine (1 - Tdap) 01/06/2017    Annual Wellness Visit (Medicare)  12/22/2022    COVID-19 Vaccine (5 - 2024-25 season) 05/06/2023         Have you been to the ER, urgent care clinic since your last visit?  Hospitalized since your last visit?    NO    "Have you seen or consulted any other health care providers outside of [facility] since your last visit?"    Yes, Dr Brendolyn Callas cardiology

## 2024-02-19 NOTE — Patient Instructions (Signed)
 Preventing Falls: Care Instructions  Injuries and health problems such as trouble walking or poor eyesight can increase your risk of falling. So can some medicines. But there are things you can do to help prevent falls. You can exercise to get stronger. You can also arrange your home to make it safer.    Talk to your doctor about the medicines you take. Ask if any of them increase the risk of falls and whether they can be changed or stopped.   Try to exercise regularly. It can help improve your strength and balance. This can help lower your risk of falling.         Practice fall safety and prevention.   Wear low-heeled shoes that fit well and give your feet good support. Talk to your doctor if you have foot problems that make this hard.  Carry a cellphone or wear a medical alert device that you can use to call for help.  Use stepladders instead of chairs to reach high objects. Don't climb if you're at risk for falls. Ask for help, if needed.  Wear the correct eyeglasses, if you need them.        Make your home safer.   Remove rugs, cords, clutter, and furniture from walkways.  Keep your house well lit. Use night-lights in hallways and bathrooms.  Install and use sturdy handrails on stairways.  Wear nonskid footwear, even inside. Don't walk barefoot or in socks without shoes.        Be safe outside.   Use handrails, curb cuts, and ramps whenever possible.  Keep your hands free by using a shoulder bag or backpack.  Try to walk in well-lit areas. Watch out for uneven ground, changes in pavement, and debris.  Be careful in the winter. Walk on the grass or gravel when sidewalks are slippery. Use de-icer on steps and walkways. Add non-slip devices to shoes.    Put grab bars and nonskid mats in your shower or tub and near the toilet. Try to use a shower chair or bath bench when bathing.   Get into a tub or shower by putting in your weaker leg first. Get out with your strong side first. Have a phone or medical alert  device in the bathroom with you.   Where can you learn more?  Go to [employer] and enter G117 to learn more about Preventing Falls: Care Instructions.  Current as of: April 04, 2023  Content Version: 14.5   [address], [place].   Care instructions adapted under license by [facility]. If you have questions about a medical condition or this instruction, always ask your healthcare professional. Laren Player, [facility], disclaims any warranty or liability for your use of this information.         Learning About Being Active as an Older Adult  Why is being active important as you get older?     Being active is one of the best things you can do for your health. And it's never too late to start. Being active--or getting active, if you aren't already--has definite benefits. It can:  Give you more energy,  Keep your mind sharp.  Improve balance to reduce your risk of falls.  Help you manage chronic illness with fewer medicines.  No matter how old you are, how fit you are, or what health problems you have, there is a form of activity that will work for you. And the more physical activity you can do, the better your overall  health will be.  What kinds of activity can help you stay healthy?  Being more active will make your daily activities easier. Physical activity includes planned exercise and things you do in daily life. There are four types of activity:  Aerobic.  Doing aerobic activity makes your heart and lungs strong.  Includes walking, dancing, and gardening.  Aim for at least 2 hours spread throughout the week.  It improves your energy and can help you sleep better.  Muscle-strengthening.  This type of activity can help maintain muscle and strengthen bones.  Includes climbing stairs, using resistance bands, and lifting or carrying heavy loads.  Aim for at least twice a week.  It can help protect the knees and other joints.  Stretching.  Stretching gives you better range of  motion in joints and muscles.  Includes upper arm stretches, calf stretches, and gentle yoga.  Aim for at least twice a week, preferably after your muscles are warmed up from other activities.  It can help you function better in daily life.  Balancing.  This helps you stay coordinated and have good posture.  Includes heel-to-toe walking, tai chi, and certain types of yoga.  Aim for at least 3 days a week.  It can reduce your risk of falling.  Even if you have a hard time meeting the recommendations, it's better to be more active than less active. All activity done in each category counts toward your weekly total. You'd be surprised how daily things like carrying groceries, keeping up with grandchildren, and taking the stairs can add up.  What keeps you from being active?  If you've had a hard time being more active, you're not alone. Maybe you remember being able to do more. Or maybe you've never thought of yourself as being active. It's frustrating when you can't do the things you want. Being more active can help. What's holding you back?  Getting started.  Have a goal, but break it into easy tasks. Small steps build into big accomplishments.  Staying motivated.  If you feel like skipping your activity, remember your goal. Maybe you want to move better and stay independent. Every activity gets you one step closer.  Not feeling your best.  Start with 5 minutes of an activity you enjoy. Prove to yourself you can do it. As you get comfortable, increase your time.  You may not be where you want to be. But you're in the process of getting there. Everyone starts somewhere.  How can you find safe ways to stay active?  Talk with your doctor about any physical challenges you're facing. Make a plan with your doctor if you have a health problem or aren't sure how to get started with activity.  If you're already active, ask your doctor if there is anything you should change to stay safe as your body and health change.  If you  tend to feel dizzy after you take medicine, avoid activity at that time. Try being active before you take your medicine. This will reduce your risk of falls.  If you plan to be active at home, make sure to clear your space before you get started. Remove things like TV cords, coffee tables, and throw rugs. It's safest to have plenty of space to move freely.  The key to getting more active is to take it slow and steady. Try to improve only a little bit at a time. Pick just one area to improve on at first. And if an activity  hurts, stop and talk to your doctor.  Where can you learn more?  Go to [employer] and enter P600 to learn more about Learning About Being Active as an Older Adult.  Current as of: April 04, 2023  Content Version: 14.5   [address], [place].   Care instructions adapted under license by [facility]. If you have questions about a medical condition or this instruction, always ask your healthcare professional. Laren Player, [facility], disclaims any warranty or liability for your use of this information.         A Healthy Heart: Care Instructions  Overview     Coronary artery disease, also called heart disease, occurs when a substance called plaque builds up in the vessels that supply oxygen-rich blood to your heart muscle. This can narrow the blood vessels and reduce blood flow. A heart attack happens when blood flow is completely blocked. A high-fat diet, smoking, and other factors increase the risk of heart disease.  Your doctor has found that you have a chance of having heart disease. A heart-healthy lifestyle can help keep your heart healthy and prevent heart disease. This lifestyle includes eating healthy, being active, staying at a weight that's healthy for you, and not smoking or using tobacco. It also includes taking medicines as directed, managing other health conditions, and trying to get a healthy amount of sleep.  Follow-up care is a key part of your  treatment and safety. Be sure to make and go to all appointments, and call your doctor if you are having problems. It's also a good idea to know your test results and keep a list of the medicines you take.  How can you care for yourself at home?  Diet    Use less salt when you cook and eat. This helps lower your blood pressure. Taste food before salting. Add only a little salt when you think you need it. With time, your taste buds will adjust to less salt.     Eat fewer snack items, fast foods, canned soups, and other high-salt, high-fat, processed foods.     Read food labels and try to avoid saturated and trans fats. They increase your risk of heart disease by raising cholesterol levels.     Limit the amount of solid fat--butter, margarine, and shortening--you eat. Use olive, peanut, or canola oil when you cook. Bake, broil, and steam foods instead of frying them.     Eat a variety of fruit and vegetables every day. Dark green, deep orange, red, or yellow fruits and vegetables are especially good for you. Examples include spinach, carrots, peaches, and berries.     Foods high in fiber can reduce your cholesterol and provide important vitamins and minerals. High-fiber foods include whole-grain cereals and breads, oatmeal, beans, brown rice, citrus fruits, and apples.     Eat lean proteins. Heart-healthy proteins include seafood, lean meats and poultry, eggs, beans, peas, nuts, seeds, and soy products.     Limit drinks and foods with added sugar. These include candy, desserts, and soda pop.   Heart-healthy lifestyle    If your doctor recommends it, get more exercise. For many people, walking is a good choice. Or you may want to swim, bike, or do other activities. Bit by bit, increase the time you're active every day. Try for at least 30 minutes on most days of the week.     Try to quit or cut back on using tobacco and other nicotine products. This includes smoking and  vaping. If you need help quitting, talk to your  doctor about stop-smoking programs and medicines. These can increase your chances of quitting for good. Quitting is one of the most important things you can do to protect your heart. It is never too late to quit. Try to avoid secondhand smoke too.     Stay at a weight that's healthy for you. Talk to your doctor if you need help losing weight.     Try to get 7 to 9 hours of sleep each night.     Limit alcohol to 2 drinks a day for men and 1 drink a day for women. Too much alcohol can cause health problems.     Manage other health problems such as diabetes, high blood pressure, and high cholesterol. If you think you may have a problem with alcohol or drug use, talk to your doctor.   Medicines    Take your medicines exactly as prescribed. Call your doctor if you think you are having a problem with your medicine.     If your doctor recommends aspirin, take the amount directed each day. Make sure you take aspirin and not another kind of pain reliever, such as acetaminophen (Tylenol).   When should you call for help?   Call 911 if you have symptoms of a heart attack. These may include:    Chest pain or pressure, or a strange feeling in the chest.     Sweating.     Shortness of breath.     Pain, pressure, or a strange feeling in the back, neck, jaw, or upper belly or in one or both shoulders or arms.     Lightheadedness or sudden weakness.     A fast or irregular heartbeat.   After you call 911, the operator may tell you to chew 1 adult-strength or 2 to 4 low-dose aspirin. Wait for an ambulance. Do not try to drive yourself.  Watch closely for changes in your health, and be sure to contact your doctor if you have any problems.  Where can you learn more?  Go to [employer] and enter F075 to learn more about A Healthy Heart: Care Instructions.  Current as of: April 04, 2023  Content Version: 14.5   [address] [place].   Care instructions adapted under license by [facility]. If you  have questions about a medical condition or this instruction, always ask your healthcare professional. Laren Player, [facility], disclaims any warranty or liability for your use of this information.    Personalized Preventive Plan for Lindsay Berry - 02/19/2024  Medicare offers a range of preventive health benefits. Some of the tests and screenings are paid in full while other may be subject to a deductible, co-insurance, and/or copay.  Some of these benefits include a comprehensive review of your medical history including lifestyle, illnesses that may run in your family, and various assessments and screenings as appropriate.  After reviewing your medical record and screening and assessments performed today your provider may have ordered immunizations, labs, imaging, and/or referrals for you.  A list of these orders (if applicable) as well as your Preventive Care list are included within your After Visit Summary for your review.

## 2024-02-19 NOTE — Progress Notes (Signed)
 Medicare Annual Wellness Visit    Lindsay Berry is here for Medicare AWV and Medication Refill    Assessment & Plan  1. Routine checkup.  - Slight decrease in sodium levels, recheck in a few months.  - Declined bone density test.  - Blood work to be ordered for further evaluation.  - Prescriptions for all current medications will be refilled and sent to pharmacy for a 90-day supply.    2. Hypertension.  - Home blood pressure readings are stable.  - Currently on valsartan  160 mg and amlodipine  10 mg.  - Refills for these medications will be sent to pharmacy for a 90-day supply.    Medicare annual wellness visit, subsequent  Essential hypertension  -     TSH; Future  -     CBC with Auto Differential; Future  -     valsartan  (DIOVAN ) 160 MG tablet; Take 1 tablet by mouth every morning, Disp-90 tablet, R-3Please send a replace/new response with 90-Day Supply if appropriate to maximize member benefit. Requesting 1 year supply.Normal  -     amLODIPine  (NORVASC ) 10 MG tablet; Take 1 tablet by mouth daily, Disp-90 tablet, R-3Normal  Hyponatremia  -     Comprehensive Metabolic Panel; Future  Hypercholesterolemia  -     Lipid Panel; Future  -     Hemoglobin A1C; Future  -     Comprehensive Metabolic Panel; Future  Gait instability  Vitamin D deficiency, unspecified  -     Vitamin D 25 Hydroxy; Future  Abnormal finding of blood chemistry, unspecified  -     Hemoglobin A1C; Future  Chronic insomnia  -     diazePAM  (VALIUM ) 2 MG tablet; Take 1 tablet by mouth nightly as needed for Anxiety for up to 90 days. Max Daily Amount: 2 mg, Disp-90 tablet, R-0Normal       No follow-ups on file.     Subjective     History of Present Illness  The patient presents for a routine checkup.    She reports overall good health, with the exception of balance issues that limit her mobility. Despite these challenges, she remains active and engages in social interactions via phone calls with friends and relatives. She has been monitoring her blood  pressure at home, which has been within normal limits. She is currently on a regimen of valsartan  160 mg and amlodipine  10 mg, as advised by Dr. Loetta Ringer.    Her last bone density test was conducted over 2 years ago. She has received the shingles vaccine in the past but does not recall the exact date. She has not yet received the RSV vaccine. She has received COVID-19 boosters, although not recently.    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      Patient's complete Health Risk Assessment and screening values have been reviewed and are found in Flowsheets. The following problems were reviewed today and where indicated follow up appointments were made and/or referrals ordered.    Positive Risk Factor Screenings with Interventions:    Fall Risk:  Do you feel unsteady or are you worried about falling? : (!) (Patient-Rptd) yes  2 or more falls in past year?: (Patient-Rptd) no  Fall with injury in past year?: (Patient-Rptd) no     Interventions:    Reviewed medications, home hazards, visual acuity, and co-morbidities that can increase risk for falls             Inactivity:  On  average, how many days per week do you engage in moderate to strenuous exercise (like a brisk walk)?: (Patient-Rptd) 0 days (!) Abnormal  On average, how many minutes do you engage in exercise at this level?: (Patient-Rptd) 10 min  Interventions:  Recommendations: patient agrees to exercise for at least 150 minutes/week                       Objective   Vitals:    02/19/24 1044 02/19/24 1055   BP: (!) 159/82 (!) 149/81   BP Site: Left Upper Arm    Patient Position: Sitting    BP Cuff Size: Medium Adult    Pulse: 80    Resp: 16    SpO2: 100%    Weight: 74.4 kg (164 lb)    Height: 1.588 m (5' 2.5)       Body mass index is 29.52 kg/m.        Physical Exam  Physical Examination: General appearance - alert, well appearing, and in no distress  Mental status - normal mood, behavior, speech, dress, motor activity,  and thought processes  Chest - clear to auscultation, no wheezes, rales or rhonchi, symmetric air entry  Heart - normal rate, regular rhythm, normal S1, S2, no murmurs, rubs, clicks or gallops  Musculoskeletal - no joint tenderness, deformity or swelling, walks with a walker  Extremities - peripheral pulses normal, no pedal edema, no clubbing or cyanosis                Allergies   Allergen Reactions    Ace Inhibitors Swelling     REACTION: swelling    Neomycin Swelling    Statins Swelling     REACTION: myalgia    Tramadol Swelling     Does not want to take again.    Constipation, decreased appetite, hot/chills, did not do well with it    Beta Adrenergic Blockers Palpitations     REACTION: swelling     Prior to Visit Medications    Medication Sig Taking? Authorizing Provider   amLODIPine  (NORVASC ) 10 MG tablet Take 1 tablet by mouth daily Yes [provider]   valsartan  (DIOVAN ) 320 MG tablet Take 1 tablet by mouth every morning  Patient taking differently: Take 0.5 tablets by mouth every morning Yes Jafari Mckillop B, MD   diazePAM  (VALIUM ) 2 MG tablet Take 1 tablet by mouth nightly as needed for Anxiety for up to 90 days. Max Daily Amount: 2 mg Yes Janiyah Beery B, MD   pantoprazole  (PROTONIX ) 40 MG tablet TAKE 1 TABLET BY MOUTH TWICE  DAILY Yes Genea Rheaume B, MD   Acetaminophen (TYLENOL ARTHRITIS PAIN PO) Take by mouth at bedtime Yes [provider]   clobetasol (TEMOVATE) 0.05 % cream APPLY TO AFFECTED AREA TO RED,SCALY PLAQUES TWICE A DAY UNTIL RESOLVED THEN STOP Yes [provider]   mupirocin (BACTROBAN) 2 % ointment  Yes [provider]   aspirin 81 MG EC tablet Take 1 tablet by mouth daily Yes Automatic Reconciliation, Ar   Cholecalciferol 50 MCG (2000 UT) CAPS Take 1 capsule by mouth daily Yes Automatic Reconciliation, Ar   olopatadine (PATADAY) 0.2 % SOLN ophthalmic solution Apply 1 drop to eye daily Yes Automatic Reconciliation, Ar   Red Yeast Rice Extract 600 MG  CAPS Take 4 capsules by mouth daily  Patient not taking: Reported on 02/19/2024  Automatic Reconciliation, Ar       CareTeam (Including outside providers/suppliers regularly involved  in providing care):   Patient Care Team:  Hillary Struss, Arletha Bend, MD as PCP - General  Verdie Wilms, Arletha Bend, MD as PCP - Empaneled Provider     Recommendations for Preventive Services Due: see orders and patient instructions/AVS.  Recommended screening schedule for the next 5-10 years is provided to the patient in written form: see Patient Instructions/AVS.     Reviewed and updated this visit:  Allergies  Meds  Med Hx  Sexual Hx                  The patient (or guardian, if applicable) and other individuals in attendance with the patient were advised that Artificial Intelligence will be utilized during this visit to record and process the conversation to generate a clinical note. The patient (or guardian, if applicable) and other individuals in attendance at the appointment consented to the use of AI, including the recording.

## 2024-03-24 ENCOUNTER — Ambulatory Visit: Payer: MEDICARE | Attending: Family Medicine | Primary: Family Medicine

## 2024-05-13 ENCOUNTER — Telehealth: Admit: 2024-05-13 | Discharge: 2024-05-13 | Payer: MEDICARE | Attending: Family Medicine | Primary: Family Medicine

## 2024-05-13 DIAGNOSIS — F5104 Psychophysiologic insomnia: Principal | ICD-10-CM

## 2024-05-13 MED ORDER — DIAZEPAM 2 MG PO TABS
2 | ORAL_TABLET | Freq: Every evening | ORAL | 0 refills | 10.00000 days | Status: DC | PRN
Start: 2024-05-13 — End: 2024-05-15

## 2024-05-13 NOTE — Progress Notes (Unsigned)
 Chief Complaint   Patient presents with    Medication Refill         Health Maintenance Due   Topic Date Due    Flu vaccine (1) 04/04/2024    COVID-19 Vaccine (5 - 2025-26 season) 05/05/2024         Have you been to the ER, urgent care clinic since your last visit?  Hospitalized since your last visit?    NO    "Have you seen or consulted any other health care providers outside of [facility] since your last visit?"    NO

## 2024-05-14 ENCOUNTER — Telehealth

## 2024-05-14 NOTE — Telephone Encounter (Signed)
 Patient's diazepam  medication was denied by the [facility] pharmacy yesterday but they sent over another request today. Patient is confused on why it was denied at first. Please advise when medication request has been addressed. CB [phone]       diazePAM  (VALIUM ) 2 MG tablet

## 2024-05-15 MED ORDER — DIAZEPAM 2 MG PO TABS
2 | ORAL_TABLET | Freq: Two times a day (BID) | ORAL | 0 refills | Status: AC | PRN
Start: 2024-05-15 — End: 2024-08-13

## 2024-05-15 NOTE — Telephone Encounter (Signed)
 Patient called again to let provider know there was an error when sending the prescription on 05/13/24 so the pharmacy did not receive it. It should go to the [facility]. I have made the pharmacy change , CB [phone].

## 2024-05-15 NOTE — Telephone Encounter (Signed)
 Medication resent

## 2024-05-22 NOTE — Progress Notes (Addendum)
 Subjective: (As above and below)     Chief Complaint   Patient presents with    Medication Refill     she is a 83 y.o. year old female who presents for evaluation.  History of Present Illness  The patient is an 83 year old female who presents via virtual visit for evaluation of cholesterol management and arthritis.    She reports an improvement in her energy levels and balance, with a decrease in muscle aches since discontinuing red yeast rice before her last blood work in 02/2024. She expresses concern about her elevated cholesterol levels and acknowledges that she could improve her diet and exercise habits. She has not been exercising recently due to the COVID-19 pandemic and lack of facilities. Her weight has decreased by 9 pounds to 158 since stopping the red yeast rice, even though she has not reduced her food intake. She also notes a reduction in swelling in her ankles, feet, and hands, allowing her to wear her rings again.     She is generally healthy and plans to have blood work done in 06/2024, along with her influenza vaccine. She was initially prescribed Zocor, but it was discontinued. A subsequent doctor prescribed Lipitor, which caused a severe reaction, leading to a switch to Crestor. However, she also had a reaction to Crestor and was advised to avoid statin drugs. Another doctor suggested trying red yeast rice, which she started while maintaining an exercise routine and monitoring her health.    She discontinued Celebrex for her arthritis, which led to an overall improvement in her condition, although she still experiences some joint discomfort.    Reviewed PmHx, RxHx, FmHx, SocHx, AllgHx and updated in chart.    Review of Systems - negative except as listed above    Objective:   Pt declined vitals2  Physical Examination: General appearance - alert, well appearing, and in no distress  Mental status - alert, oriented to person, place, and time  Chest - clear to auscultation, no wheezes, rales or  rhonchi, symmetric air entry  Heart - normal rate, regular rhythm, normal S1, S2, no murmurs, rubs, clicks or gallops  Musculoskeletal - no joint tenderness, deformity or swelling    Assessment/ Plan:   1. Chronic insomnia  -stable on valium , reviewed risks and appropriate use       Assessment & Plan  1. Cholesterol management:  - Cholesterol levels have shown a significant increase since the last check in 02/2024.  - Severe reactions to statins such as Lipitor and Crestor were discussed. The patient has stopped taking red yeast rice, which she believes was affecting her energy and balance. The plan is to focus on dietary modifications and regular exercise for cholesterol management.  - No new cholesterol medication will be initiated at this time.    2. Arthritis:  - The patient has stopped taking Celebrex for her arthritis, which has led to an improvement in her overall well-being, although she still experiences some joint discomfort.    3. Medication management:  - A refill for Valium  will be provided.    4. Health maintenance:  - The patient is scheduled for blood work and an influenza vaccine in 06/2024. She is advised to wear a mask while in the waiting room to protect herself and others.    Return in about 3 months (around 08/12/2024), or if symptoms worsen or fail to improve.    I have discussed the diagnosis with the patient and the intended plan as seen in the  above orders.  The patient has received an after-visit summary and questions were answered concerning future plans.     Medication Side Effects and Warnings were discussed with patient: yes  Patient Labs were reviewed: yes  Patient Past Records were reviewed:  yes    The patient (or guardian, if applicable) and other individuals in attendance with the patient were advised that Artificial Intelligence will be utilized during this visit to record and process the conversation to generate a clinical note. The patient (or guardian, if applicable) and other  individuals in attendance at the appointment consented to the use of AI, including the recording.      Darby Rouleau, M.D.    [employer], was evaluated through a synchronous (real-time) audio-video encounter. The patient (or guardian if applicable) is aware that this is a billable service, which includes applicable co-pays. This Virtual Visit was conducted with patient's (and/or legal guardian's) consent. Patient identification was verified, and a caregiver was present when appropriate.   The patient was located at Home: [address]  [place] [address]  Provider was located at [employer] (Appt Dept): [address]  [place],  [place] [address]  Confirm you are appropriately licensed, registered, or certified to deliver care in the state where the patient is located as indicated above. If you are not or unsure, please re-schedule the visit: Yes, I confirm.       Total time spent on this encounter: Not billed by time    --Darby Rouleau, MD on 05/22/2024 at 3:25 PM    An electronic signature was used to authenticate this note.

## 2024-05-22 NOTE — Addendum Note (Signed)
 Addended by: Lorry Furber on: 05/22/2024 03:26 PM     Modules accepted: Level of Service

## 2024-05-28 ENCOUNTER — Telehealth: Admitting: Internal Medicine

## 2024-05-28 DIAGNOSIS — G4733 Obstructive sleep apnea (adult) (pediatric): Secondary | ICD-10-CM | POA: Diagnosis not present

## 2024-05-28 NOTE — Progress Notes (Signed)
 Virtual Visit via Video Note  I connected with [name] [name] on 05/28/24 at 11:00 AM EDT by a video enabled telemedicine application and verified that I am speaking with the correct person using two identifiers.  Location: Patient: Home Provider: Office    History of Present Illness: 83 year old female, never smoked. PMH significant for HTN, vascular insufficiency, OSA on CPAP, diverticular disease, multiple thyroid  nodules, diastolic dysfunction.     Discussed sleep data and reviewed with patient.  Encouraged proper weight management.  Discussed driving precautions and its relationship with hypersomnolence.  Discussed sleep hygiene, and benefits of a fixed sleep waked time.  The importance of getting eight or more hours of sleep discussed with patient.  Discussed limiting the use of the computer and television before bedtime.  Decrease naps during the day, so night time sleep will become enhanced.  Limit caffeine, and sleep deprivation.   Patient uses and benefits from therapy Using CPAP nightly and with naps Pressure setting is comfortable and is sleeping well. Auto CPAP 5-15 AHI reduced to 1.8 Excellent compliance   Observations/Objective:  Appears well; No overt respiratory symptoms   Assessment and Plan:  Excellent compliance report reviewed with patient in detail Assessment of OSA Continue CPAP as prescribed  Excellent compliance report Reviewed compliance report in detail with patient Patient definitely benefits the use of CPAP therapy as prescribed Using CPAP nightly and with naps Pressure setting is comfortable and is sleeping well. CPAP prescription 5-15 AHI reduced to 1.8  No evidence of acute heart failure at this time No respiratory distress No fevers, chills, nausea, vomiting, diarrhea No evidence hemoptysis  Patient Instructions Continue to use CPAP every night, minimum of 4-6 hours a night.  Change equipment every 30 days or as directed by DME.   Wash your tubing with warm soap and water daily, hang to dry. Wash humidifier portion weekly. Use bottled, distilled water and change daily   Be aware of reduced alertness and do not drive or operate heavy machinery if experiencing this or drowsiness.  Exercise encouraged, as tolerated. Encouraged proper weight management.  Important to get eight or more hours of sleep  Limiting the use of the computer and television before bedtime.  Decrease naps during the day, so night time sleep will become enhanced.  Limit caffeine, and sleep deprivation.  HTN, stroke, uncontrolled diabetes and heart failure are potential risk factors.  Risk of untreated sleep apnea including cardiac arrhthymias, stroke, DM, pulm HTN.     CURRENT MEDICATIONS REVIEWED AT LENGTH WITH PATIENT TODAY   Patient  satisfied with Plan of action and management. All questions answered   Follow up 1 year   I spent a total of 35 minutes dedicated to the care of this patient on the date of this encounter to include pre-visit review of records, face-to-face time with the patient discussing conditions above, post visit ordering of testing, clinical documentation with the electronic health record, making appropriate referrals as documented, and communicating necessary information to the patient's healthcare team.    The Patient requires high complexity decision making for assessment and support, frequent evaluation and titration of therapies, application of advanced monitoring technologies and extensive interpretation of multiple databases.  Patient satisfied with Plan of action and management. All questions answered    Nickolas Alm Cellar, M.D.  [facility]  Medical Director [facility] Medical Director [facility] Cardio-Pulmonary Department

## 2024-06-05 ENCOUNTER — Other Ambulatory Visit: Admit: 2024-06-05 | Discharge: 2024-06-05 | Payer: MEDICARE | Primary: Family Medicine

## 2024-06-05 DIAGNOSIS — I10 Essential (primary) hypertension: Principal | ICD-10-CM

## 2024-06-06 ENCOUNTER — Ambulatory Visit: Payer: MEDICARE | Primary: Family Medicine

## 2024-06-06 LAB — COMPREHENSIVE METABOLIC PANEL
ALT: 30 U/L (ref 10–35)
AST: 24 U/L (ref 10–35)
Albumin/Globulin Ratio: 1.5 (ref 1.1–2.2)
Albumin: 4.7 g/dL (ref 3.5–5.2)
Alk Phosphatase: 112 U/L — ABNORMAL HIGH (ref 35–104)
Anion Gap: 14 mmol/L (ref 2–14)
BUN/Creatinine Ratio: 22 — ABNORMAL HIGH (ref 12–20)
BUN: 16 mg/dL (ref 8–23)
CO2: 23 mmol/L (ref 20–29)
Calcium: 10 mg/dL (ref 8.8–10.2)
Chloride: 99 mmol/L (ref 98–107)
Creatinine: 0.73 mg/dL (ref 0.60–1.00)
Est, Glom Filt Rate: 82 ml/min/1.73m2 (ref >59)
Globulin: 3.1 g/dL (ref 2.0–4.0)
Glucose: 106 mg/dL — ABNORMAL HIGH (ref 65–100)
Potassium: 4.5 mmol/L (ref 3.5–5.1)
Sodium: 135 mmol/L — ABNORMAL LOW (ref 136–145)
Total Bilirubin: 0.6 mg/dL (ref 0.0–1.2)
Total Protein: 7.7 g/dL (ref 6.4–8.3)

## 2024-06-06 LAB — VITAMIN D 25 HYDROXY: Vit D, 25-Hydroxy: 45.2 ng/mL (ref 30.0–100.0)

## 2024-06-06 LAB — LIPID PANEL
Chol/HDL Ratio: 3.4 (ref 0.0–5.0)
Cholesterol, Total: 273 mg/dL — ABNORMAL HIGH (ref 0–200)
HDL: 80 mg/dL — ABNORMAL HIGH (ref 40–60)
LDL Cholesterol: 156 mg/dL — ABNORMAL HIGH (ref 0–100)
Triglycerides: 181 mg/dL — ABNORMAL HIGH (ref 0–150)
VLDL Cholesterol Calculated: 36 mg/dL

## 2024-06-06 LAB — CBC WITH AUTO DIFFERENTIAL
Basophils %: 1.1 % — ABNORMAL HIGH (ref 0.0–1.0)
Basophils Absolute: 0.07 K/UL (ref 0.00–0.10)
Eosinophils %: 1.8 % (ref 0.0–7.0)
Eosinophils Absolute: 0.12 K/UL (ref 0.00–0.40)
Hematocrit: 44.6 % (ref 35.0–47.0)
Hemoglobin: 15.4 g/dL (ref 11.5–16.0)
Immature Granulocytes %: 0.2 % (ref 0.0–0.5)
Immature Granulocytes Absolute: 0.01 K/UL (ref 0.00–0.04)
Lymphocytes %: 41.5 % (ref 12.0–49.0)
Lymphocytes Absolute: 2.75 K/UL (ref 0.80–3.50)
MCH: 30.4 pg (ref 26.0–34.0)
MCHC: 34.5 g/dL (ref 30.0–36.5)
MCV: 88 FL (ref 80.0–99.0)
MPV: 9.3 FL (ref 8.9–12.9)
Monocytes %: 7.2 % (ref 5.0–13.0)
Monocytes Absolute: 0.48 K/UL (ref 0.00–1.00)
Neutrophils %: 48.2 % (ref 32.0–75.0)
Neutrophils Absolute: 3.2 K/UL (ref 1.80–8.00)
Nucleated RBCs: 0 /100{WBCs}
Platelets: 361 K/uL (ref 150–400)
RBC: 5.07 M/uL (ref 3.80–5.20)
RDW: 12.7 % (ref 11.5–14.5)
WBC: 6.6 K/uL (ref 3.6–11.0)
nRBC: 0 K/uL (ref 0.00–0.01)

## 2024-06-06 LAB — TSH: TSH, 3rd Generation: 1.23 u[IU]/mL (ref 0.270–4.200)

## 2024-06-06 LAB — HEMOGLOBIN A1C
Estimated Avg Glucose: 111 mg/dL
Hemoglobin A1C: 5.5 % (ref 4.0–5.6)

## 2024-06-10 ENCOUNTER — Ambulatory Visit: Admit: 2024-06-10 | Discharge: 2024-06-10 | Payer: MEDICARE | Attending: Family Medicine | Primary: Family Medicine

## 2024-06-10 VITALS — BP 135/83 | HR 72 | Temp 98.30000°F | Resp 16 | Ht 62.5 in | Wt 159.0 lb

## 2024-06-10 DIAGNOSIS — Z23 Encounter for immunization: Principal | ICD-10-CM

## 2024-06-10 NOTE — Progress Notes (Signed)
"  Chief Complaint   Patient presents with    3 Month Follow-Up         Health Maintenance Due   Topic Date Due    Flu vaccine (1) 04/04/2024    COVID-19 Vaccine (5 - 2025-26 season) 05/05/2024         Have you been to the ER, urgent care clinic since your last visit?  Hospitalized since your last visit?    NO    Have you seen or consulted any other health care providers outside of [facility] since your last visit?    Cardiology, Pulmonolgy           "

## 2024-06-10 NOTE — Progress Notes (Signed)
 "Chief Complaint   Patient presents with    3 Month Follow-Up     Subjective: (As above and below)     Chief Complaint   Patient presents with    3 Month Follow-Up     she is a 83 y.o. year old female who presents for evaluation.  History of Present Illness  The patient presents for evaluation of hypercholesterolemia and mental health.    She has discontinued the use of red yeast rice due to persistent discomfort and reports an overall improvement in her well-being, attributing this to a more tranquil living environment. She is now able to engage in work and cognitive activities without feeling overwhelmed.    She has been under significant stress, which she believes was exacerbated by mental health issues if those around her, including borderline personality disorder and psychosis. She expresses gratitude for the support provided by her family during this challenging period. She is emotionally, mentally, and physically strong.    She reports mild swelling in her ankle today.    Living Condition: She lives in a peaceful environment.    Reviewed PmHx, RxHx, FmHx, SocHx, AllgHx and updated in chart.    Review of Systems - negative except as listed above    Objective:     Vitals:    06/10/24 1047   BP: 135/83   BP Site: Right Upper Arm   Patient Position: Sitting   BP Cuff Size: Medium Adult   Pulse: 72   Resp: 16   Temp: 98.3 F (36.8 C)   TempSrc: Temporal   SpO2: 98%   Weight: 72.1 kg (159 lb)   Height: 1.588 m (5' 2.5)     Physical Examination: General appearance - alert, well appearing, and in no distress  Mental status - normal mood, behavior, speech, dress, motor activity, and thought processes  Chest - clear to auscultation, no wheezes, rales or rhonchi, symmetric air entry  Heart - normal rate, regular rhythm, normal S1, S2, no murmurs, rubs, clicks or gallops  Musculoskeletal - no joint tenderness, deformity or swelling  Extremities - peripheral pulses normal, no pedal edema, no clubbing or  cyanosis    Assessment/ Plan:   1. Need for influenza vaccination  - Influenza, FLUAD Trivalent, (age 64 y+), IM, Preservative Free, 0.5mL    2. Essential hypertension  -well controlled    3. Multiple thyroid nodules  -labs WNL    4. Hypercholesterolemia  -pt unable to tolerate cholesterol medication or supplements    5. Chronic insomnia  -stable on current medication    6. Hyponatremia  -stable       Assessment & Plan  1. Hypercholesterolemia:  - Her cholesterol levels have significantly increased.  - The importance of monitoring her cholesterol levels was discussed.    2. Mental health:  - She has been under significant stress  - She expresses gratitude for the support provided by her family during this challenging period.    Return in about 3 months (around 09/10/2024), or if symptoms worsen or fail to improve.    I have discussed the diagnosis with the patient and the intended plan as seen in the above orders.  The patient has received an after-visit summary and questions were answered concerning future plans.     Medication Side Effects and Warnings were discussed with patient: yes  Patient Labs were reviewed: yes  Patient Past Records were reviewed:  yes    The patient (or guardian, if applicable) and other individuals  in attendance with the patient were advised that Artificial Intelligence will be utilized during this visit to record and process the conversation to generate a clinical note. The patient (or guardian, if applicable) and other individuals in attendance at the appointment consented to the use of AI, including the recording.      Darby Rouleau, M.D.      "

## 2024-07-18 ENCOUNTER — Encounter

## 2024-07-23 NOTE — Telephone Encounter (Signed)
"  Spoke to patient and she still has medication and is taken the medication as prescribed. The pharmacy confused the  patient and was telling her she needed to request a refill for medication. Patient thought it was to early and was just calling to double check with you.   "

## 2024-07-23 NOTE — Telephone Encounter (Signed)
"  Pt is calling stating pharmacy is calling her to get a refill on med. Was told that it is too soon and she wants to know when she can get this refilled        diazePAM  (VALIUM ) 2 MG tablet     "

## 2024-07-23 NOTE — Telephone Encounter (Signed)
"  Medication was filled 05/23/24 for 90 pills, this would place the next refill due at 08/22/24.     Please clarify how pt is using medication.   "

## 2024-08-04 ENCOUNTER — Ambulatory Visit: Payer: MEDICARE | Attending: Family Medicine | Primary: Family Medicine

## 2024-08-18 ENCOUNTER — Encounter

## 2024-08-18 MED ORDER — DIAZEPAM 2 MG PO TABS
2 | ORAL_TABLET | Freq: Every evening | ORAL | 0 refills | Status: AC | PRN
Start: 2024-08-18 — End: 2024-11-16

## 2024-08-18 NOTE — Telephone Encounter (Signed)
"  PMP reviewed  "

## 2024-08-18 NOTE — Telephone Encounter (Addendum)
"  Pt is calling requesting a refill on med. She states she was told to remind you because it is a mail order she was told to call today.    diazePAM  (VALIUM ) 2 MG tablet   "

## 2024-08-20 ENCOUNTER — Ambulatory Visit: Payer: MEDICARE | Attending: Neurology | Primary: Family Medicine

## 2024-08-20 ENCOUNTER — Ambulatory Visit: Payer: MEDICARE | Primary: Family Medicine

## 2024-09-16 ENCOUNTER — Telehealth: Admit: 2024-09-16 | Discharge: 2024-09-16 | Payer: MEDICARE | Attending: Family Medicine | Primary: Family Medicine

## 2024-09-16 DIAGNOSIS — J111 Influenza due to unidentified influenza virus with other respiratory manifestations: Principal | ICD-10-CM

## 2024-09-16 MED ORDER — OSELTAMIVIR PHOSPHATE 75 MG PO CAPS
75 | ORAL_CAPSULE | Freq: Two times a day (BID) | ORAL | 0 refills | Status: AC
Start: 2024-09-16 — End: 2024-09-21

## 2024-09-16 MED ORDER — BENZONATATE 200 MG PO CAPS
200 | ORAL_CAPSULE | Freq: Three times a day (TID) | ORAL | 0 refills | Status: AC | PRN
Start: 2024-09-16 — End: 2024-09-26

## 2024-09-16 NOTE — Progress Notes (Signed)
 Chief Complaint   Patient presents with    Congestion    Cough     Exposed to the flu.  Symptoms started yesterday.         Health Maintenance Due   Topic Date Due    Pneumococcal 50+ years Vaccine (2 of 2 - PCV) 08/13/2009    COVID-19 Vaccine (5 - 2025-26 season) 05/05/2024         Have you been to the ER, urgent care clinic since your last visit?  Hospitalized since your last visit?    NO    Have you seen or consulted any other health care providers outside of [facility] since your last visit?    Cardiology and pulmonology

## 2024-09-21 NOTE — Progress Notes (Signed)
 09/16/2024    TELEHEALTH EVALUATION -- Audio/Visual    HPI:    Lindsay Berry (DOB:  1940/09/13) has requested an audio/video evaluation for the following concern(s):    History of Present Illness  The patient is an 84 year old female who presents for evaluation of influenza.    She reports a recent exposure to a family member diagnosed with influenza A. Her symptoms began on Sunday with a dry cough, which has since escalated. She experienced a restful night last night, aided by her CPAP machine, but upon removing it, she resumed coughing. Accompanying these symptoms are gastrointestinal discomfort and a loss of appetite. She also reports the onset of nausea and loose bowel movements today. This is her second encounter with influenza, the first occurring in 1960.        Prior to Visit Medications   Medication Sig Taking? Authorizing Provider   oseltamivir  (TAMIFLU ) 75 MG capsule Take 1 capsule by mouth 2 times daily for 5 days Yes Thadeus Gandolfi B, MD   benzonatate  (TESSALON ) 200 MG capsule Take 1 capsule by mouth 3 times daily as needed for Cough Yes Wayburn Shaler B, MD   diazePAM  (VALIUM ) 2 MG tablet Take 1 tablet by mouth nightly as needed for Anxiety for up to 90 days. Max Daily Amount: 2 mg Yes Donnalynn Wheeless B, MD   valsartan  (DIOVAN ) 160 MG tablet Take 1 tablet by mouth every morning Yes Jaevian Shean B, MD   pantoprazole  (PROTONIX ) 40 MG tablet Take 1 tablet by mouth 2 times daily Yes Nima Kemppainen B, MD   amLODIPine  (NORVASC ) 10 MG tablet Take 1 tablet by mouth daily Yes Shannan Garfinkel, Darby NOVAK, MD   Acetaminophen (TYLENOL ARTHRITIS PAIN PO) Take by mouth at bedtime Yes [provider]   clobetasol (TEMOVATE) 0.05 % cream APPLY TO AFFECTED AREA TO RED,SCALY PLAQUES TWICE A DAY UNTIL RESOLVED THEN STOP Yes [provider]   mupirocin (BACTROBAN) 2 % ointment  Yes [provider]   aspirin 81 MG EC tablet Take 1 tablet by mouth daily Yes Automatic Reconciliation, Ar    Cholecalciferol 50 MCG (2000 UT) CAPS Take 1 capsule by mouth daily Yes Automatic Reconciliation, Ar   olopatadine (PATADAY) 0.2 % SOLN ophthalmic solution Apply 1 drop to eye daily Yes Automatic Reconciliation, Ar       Social History     Tobacco Use    Smoking status: Never     Passive exposure: Never    Smokeless tobacco: Never   Vaping Use    Vaping status: Never Used   Substance Use Topics    Alcohol use: Never    Drug use: Never          PHYSICAL EXAMINATION:  [ INSTRUCTIONS:  [x]  Indicates a positive item  []  Indicates a negative item  -- DELETE ALL ITEMS NOT EXAMINED]  Vital Signs: (As obtained by patient/caregiver or practitioner observation)    Blood pressure-  Heart rate-    Respiratory rate-    Temperature-  Pulse oximetry-     Constitutional: [x]  Appears well-developed and well-nourished []  No apparent distress      []  Abnormal-   Mental status  [x]  Alert and awake  []  Oriented to person/place/time [] Able to follow commands      Eyes:  EOM    [x]   Normal  []  Abnormal-  Sclera  []   Normal  []  Abnormal -         Discharge []   None visible  []  Abnormal -  HENT:   [x]  Normocephalic, atraumatic.  []  Abnormal   []  Mouth/Throat: Mucous membranes are moist.     External Ears [x]  Normal  []  Abnormal-     Neck: [x]  No visualized mass     Pulmonary/Chest: [x]  Respiratory effort normal.  []  No visualized signs of difficulty breathing or respiratory distress        []  Abnormal-      Musculoskeletal:   [x]  Normal gait with no signs of ataxia         []  Normal range of motion of neck        []  Abnormal-       Neurological:        [x]  No Facial Asymmetry (Cranial nerve 7 motor function) (limited exam to video visit)          []  No gaze palsy        []  Abnormal-         Skin:        [x]  No significant exanthematous lesions or discoloration noted on facial skin         []  Abnormal-            Psychiatric:       [x]  Normal Affect []  No Hallucinations        []  Abnormal-     Other pertinent observable physical exam  findings-     ASSESSMENT/PLAN:  1. Influenza  -take medication as written  - oseltamivir  (TAMIFLU ) 75 MG capsule; Take 1 capsule by mouth 2 times daily for 5 days  Dispense: 10 capsule; Refill: 0  - benzonatate  (TESSALON ) 200 MG capsule; Take 1 capsule by mouth 3 times daily as needed for Cough  Dispense: 30 capsule; Refill: 0      Return if symptoms worsen or fail to improve.    [employer], was evaluated through a synchronous (real-time) audio-video encounter. The patient (or guardian if applicable) is aware that this is a billable service, which includes applicable co-pays. This Virtual Visit was conducted with patient's (and/or legal guardian's) consent. Patient identification was verified, and a caregiver was present when appropriate.   The patient was located at Home: [address]  [place] [address]  Provider was located at [employer] (Appt Dept): [address]  [place],  [place] [address]  Confirm you are appropriately licensed, registered, or certified to deliver care in the state where the patient is located as indicated above. If you are not or unsure, please re-schedule the visit: Yes, I confirm.       Total time spent on this encounter: Not billed by time    --Darby Rouleau, MD on 09/21/2024 at 3:06 PM    An electronic signature was used to authenticate this note.

## 2024-09-25 NOTE — Telephone Encounter (Signed)
 Pt is calling to get OTC meds she can take for loose bowels. Pt stated she is just getting over the flu    Please call to discuss

## 2024-09-25 NOTE — Telephone Encounter (Signed)
 Please call for more information and symptoms - esp any fever.     Recommend taking imodium for loose bowels, BRAT diet, increase fluids to prevent dehydration.

## 2024-09-26 NOTE — Telephone Encounter (Signed)
 Wonderful - continue plan as below

## 2024-09-26 NOTE — Telephone Encounter (Signed)
 Spoke to pt and said no fever and she is feeling a little better and drinking a lot of fluids.
